# Patient Record
Sex: Female | Born: 1940 | Race: Black or African American | Hispanic: No | Smoking: Never smoker
Health system: Southern US, Community
[De-identification: ages and names within clinical notes are randomized; demographics above are authoritative.]

## PROBLEM LIST (undated history)

## (undated) DIAGNOSIS — E1139 Type 2 diabetes mellitus with other diabetic ophthalmic complication: Secondary | ICD-10-CM

## (undated) DIAGNOSIS — K219 Gastro-esophageal reflux disease without esophagitis: Secondary | ICD-10-CM

## (undated) DIAGNOSIS — F32A Depression, unspecified: Secondary | ICD-10-CM

## (undated) DIAGNOSIS — G5603 Carpal tunnel syndrome, bilateral upper limbs: Secondary | ICD-10-CM

## (undated) DIAGNOSIS — Z9289 Personal history of other medical treatment: Secondary | ICD-10-CM

## (undated) DIAGNOSIS — R200 Anesthesia of skin: Secondary | ICD-10-CM

## (undated) DIAGNOSIS — Z87442 Personal history of urinary calculi: Secondary | ICD-10-CM

## (undated) DIAGNOSIS — E1165 Type 2 diabetes mellitus with hyperglycemia: Secondary | ICD-10-CM

## (undated) DIAGNOSIS — J45901 Unspecified asthma with (acute) exacerbation: Secondary | ICD-10-CM

## (undated) DIAGNOSIS — R600 Localized edema: Secondary | ICD-10-CM

## (undated) DIAGNOSIS — Z87898 Personal history of other specified conditions: Secondary | ICD-10-CM

## (undated) DIAGNOSIS — M4800 Spinal stenosis, site unspecified: Secondary | ICD-10-CM

## (undated) DIAGNOSIS — R3915 Urgency of urination: Secondary | ICD-10-CM

## (undated) DIAGNOSIS — R202 Paresthesia of skin: Secondary | ICD-10-CM

## (undated) DIAGNOSIS — H409 Unspecified glaucoma: Secondary | ICD-10-CM

## (undated) DIAGNOSIS — M129 Arthropathy, unspecified: Secondary | ICD-10-CM

## (undated) DIAGNOSIS — S7291XA Unspecified fracture of right femur, initial encounter for closed fracture: Secondary | ICD-10-CM

## (undated) DIAGNOSIS — M1990 Unspecified osteoarthritis, unspecified site: Secondary | ICD-10-CM

## (undated) DIAGNOSIS — G43909 Migraine, unspecified, not intractable, without status migrainosus: Secondary | ICD-10-CM

## (undated) DIAGNOSIS — J302 Other seasonal allergic rhinitis: Secondary | ICD-10-CM

## (undated) DIAGNOSIS — M25519 Pain in unspecified shoulder: Secondary | ICD-10-CM

## (undated) DIAGNOSIS — F329 Major depressive disorder, single episode, unspecified: Secondary | ICD-10-CM

## (undated) DIAGNOSIS — D649 Anemia, unspecified: Secondary | ICD-10-CM

## (undated) DIAGNOSIS — E785 Hyperlipidemia, unspecified: Secondary | ICD-10-CM

## (undated) DIAGNOSIS — F0390 Unspecified dementia without behavioral disturbance: Secondary | ICD-10-CM

## (undated) DIAGNOSIS — M545 Low back pain: Secondary | ICD-10-CM

## (undated) DIAGNOSIS — G894 Chronic pain syndrome: Secondary | ICD-10-CM

## (undated) DIAGNOSIS — R0602 Shortness of breath: Secondary | ICD-10-CM

## (undated) DIAGNOSIS — I10 Essential (primary) hypertension: Secondary | ICD-10-CM

## (undated) HISTORY — DX: Type 2 diabetes mellitus with other diabetic ophthalmic complication: E11.39

## (undated) HISTORY — DX: Gastro-esophageal reflux disease without esophagitis: K21.9

## (undated) HISTORY — PX: OTHER SURGICAL HISTORY: SHX169

## (undated) HISTORY — DX: Unspecified fracture of right femur, initial encounter for closed fracture: S72.91XA

## (undated) HISTORY — PX: CATARACT EXTRACTION: SUR2

## (undated) HISTORY — DX: Hyperlipidemia, unspecified: E78.5

## (undated) HISTORY — DX: Unspecified asthma with (acute) exacerbation: J45.901

## (undated) HISTORY — DX: Type 2 diabetes mellitus with hyperglycemia: E11.65

## (undated) HISTORY — PX: BACK SURGERY: SHX140

## (undated) HISTORY — PX: TUBAL LIGATION: SHX77

## (undated) HISTORY — DX: Carpal tunnel syndrome, bilateral upper limbs: G56.03

## (undated) HISTORY — DX: Personal history of other specified conditions: Z87.898

## (undated) HISTORY — DX: Migraine, unspecified, not intractable, without status migrainosus: G43.909

## (undated) HISTORY — DX: Personal history of urinary calculi: Z87.442

## (undated) HISTORY — DX: Shortness of breath: R06.02

## (undated) HISTORY — DX: Chronic pain syndrome: G89.4

## (undated) HISTORY — DX: Unspecified dementia, unspecified severity, without behavioral disturbance, psychotic disturbance, mood disturbance, and anxiety: F03.90

## (undated) HISTORY — PX: CHOLECYSTECTOMY: SHX55

## (undated) HISTORY — DX: Low back pain: M54.5

## (undated) HISTORY — DX: Essential (primary) hypertension: I10

## (undated) HISTORY — DX: Pain in unspecified shoulder: M25.519

## (undated) HISTORY — DX: Arthropathy, unspecified: M12.9

## (undated) HISTORY — DX: Spinal stenosis, site unspecified: M48.00

## (undated) HISTORY — DX: Anemia, unspecified: D64.9

## (undated) HISTORY — DX: Unspecified glaucoma: H40.9

## (undated) HISTORY — PX: THORACIC FUSION: SHX1062

---

## 1996-08-03 LAB — HM COLONOSCOPY: HM Colonoscopy: NORMAL

## 1998-02-01 ENCOUNTER — Ambulatory Visit (HOSPITAL_COMMUNITY): Admission: RE | Admit: 1998-02-01 | Discharge: 1998-02-01 | Payer: Self-pay | Admitting: *Deleted

## 1998-02-17 ENCOUNTER — Ambulatory Visit (HOSPITAL_COMMUNITY): Admission: RE | Admit: 1998-02-17 | Discharge: 1998-02-17 | Payer: Self-pay | Admitting: *Deleted

## 1998-11-07 ENCOUNTER — Other Ambulatory Visit: Admission: RE | Admit: 1998-11-07 | Discharge: 1998-11-07 | Payer: Self-pay | Admitting: Obstetrics and Gynecology

## 2000-01-30 ENCOUNTER — Other Ambulatory Visit: Admission: RE | Admit: 2000-01-30 | Discharge: 2000-01-30 | Payer: Self-pay | Admitting: Obstetrics and Gynecology

## 2001-03-03 ENCOUNTER — Other Ambulatory Visit: Admission: RE | Admit: 2001-03-03 | Discharge: 2001-03-03 | Payer: Self-pay | Admitting: Obstetrics and Gynecology

## 2002-04-05 ENCOUNTER — Other Ambulatory Visit: Admission: RE | Admit: 2002-04-05 | Discharge: 2002-04-05 | Payer: Self-pay | Admitting: Obstetrics and Gynecology

## 2002-10-20 ENCOUNTER — Ambulatory Visit (HOSPITAL_COMMUNITY): Admission: RE | Admit: 2002-10-20 | Discharge: 2002-10-20 | Payer: Self-pay | Admitting: Family Medicine

## 2002-10-20 ENCOUNTER — Encounter: Payer: Self-pay | Admitting: Family Medicine

## 2003-04-18 ENCOUNTER — Other Ambulatory Visit: Admission: RE | Admit: 2003-04-18 | Discharge: 2003-04-18 | Payer: Self-pay | Admitting: Obstetrics and Gynecology

## 2003-06-15 ENCOUNTER — Ambulatory Visit (HOSPITAL_COMMUNITY): Admission: RE | Admit: 2003-06-15 | Discharge: 2003-06-15 | Payer: Self-pay | Admitting: Family Medicine

## 2003-08-04 ENCOUNTER — Emergency Department (HOSPITAL_COMMUNITY): Admission: EM | Admit: 2003-08-04 | Discharge: 2003-08-04 | Payer: Self-pay | Admitting: Emergency Medicine

## 2004-05-01 ENCOUNTER — Other Ambulatory Visit: Admission: RE | Admit: 2004-05-01 | Discharge: 2004-05-01 | Payer: Self-pay | Admitting: Obstetrics and Gynecology

## 2005-05-04 ENCOUNTER — Other Ambulatory Visit: Admission: RE | Admit: 2005-05-04 | Discharge: 2005-05-04 | Payer: Self-pay | Admitting: Obstetrics and Gynecology

## 2006-06-01 ENCOUNTER — Other Ambulatory Visit: Admission: RE | Admit: 2006-06-01 | Discharge: 2006-06-01 | Payer: Self-pay | Admitting: Obstetrics and Gynecology

## 2006-06-28 ENCOUNTER — Encounter: Payer: Self-pay | Admitting: Internal Medicine

## 2006-06-28 LAB — CONVERTED CEMR LAB

## 2007-02-17 ENCOUNTER — Ambulatory Visit: Payer: Self-pay | Admitting: Internal Medicine

## 2007-02-18 ENCOUNTER — Encounter: Payer: Self-pay | Admitting: Internal Medicine

## 2007-02-18 LAB — CONVERTED CEMR LAB
ALT: 22 units/L (ref 0–35)
AST: 20 units/L (ref 0–37)
Albumin: 3.6 g/dL (ref 3.5–5.2)
Alkaline Phosphatase: 60 units/L (ref 39–117)
BUN: 22 mg/dL (ref 6–23)
Basophils Absolute: 0 10*3/uL (ref 0.0–0.1)
Basophils Relative: 0.6 % (ref 0.0–1.0)
Bilirubin, Direct: 0.1 mg/dL (ref 0.0–0.3)
Blood Glucose, Fasting: 167 mg/dL
CO2: 27 meq/L (ref 19–32)
Calcium: 10 mg/dL (ref 8.4–10.5)
Chloride: 106 meq/L (ref 96–112)
Cholesterol: 200 mg/dL (ref 0–200)
Creatinine, Ser: 0.9 mg/dL (ref 0.4–1.2)
Eosinophils Absolute: 0.4 10*3/uL (ref 0.0–0.6)
Eosinophils Relative: 6.4 % — ABNORMAL HIGH (ref 0.0–5.0)
GFR calc Af Amer: 81 mL/min
GFR calc non Af Amer: 67 mL/min
Glucose, Bld: 167 mg/dL — ABNORMAL HIGH (ref 70–99)
HCT: 31.4 % — ABNORMAL LOW (ref 36.0–46.0)
HDL: 42 mg/dL (ref >39.0)
Hemoglobin: 10.9 g/dL — ABNORMAL LOW (ref 12.0–15.0)
Hgb A1c MFr Bld: 9.6 %
Hgb A1c MFr Bld: 9.6 % — ABNORMAL HIGH (ref 4.6–6.0)
LDL Cholesterol: 142 mg/dL — ABNORMAL HIGH (ref 0–99)
Lymphocytes Relative: 32.2 % (ref 12.0–46.0)
MCHC: 34.6 g/dL (ref 30.0–36.0)
MCV: 90.4 fL (ref 78.0–100.0)
Monocytes Absolute: 0.6 10*3/uL (ref 0.2–0.7)
Monocytes Relative: 9.8 % (ref 3.0–11.0)
Neutro Abs: 2.9 10*3/uL (ref 1.4–7.7)
Neutrophils Relative %: 51 % (ref 43.0–77.0)
Platelets: 194 10*3/uL (ref 150–400)
Potassium: 5.1 meq/L (ref 3.5–5.1)
RBC: 3.47 M/uL — ABNORMAL LOW (ref 3.87–5.11)
RDW: 13.5 % (ref 11.5–14.6)
Sodium: 140 meq/L (ref 135–145)
TSH: 2.84 microintl units/mL
TSH: 2.84 microintl units/mL (ref 0.35–5.50)
Total Bilirubin: 0.8 mg/dL (ref 0.3–1.2)
Total CHOL/HDL Ratio: 4.8
Total Protein: 7 g/dL (ref 6.0–8.3)
Triglycerides: 82 mg/dL (ref 0–149)
VLDL: 16 mg/dL (ref 0–40)
WBC: 5.8 10*3/uL (ref 4.5–10.5)

## 2007-03-14 ENCOUNTER — Ambulatory Visit: Payer: Self-pay | Admitting: Internal Medicine

## 2007-05-26 ENCOUNTER — Ambulatory Visit: Payer: Self-pay | Admitting: Internal Medicine

## 2007-05-26 LAB — CONVERTED CEMR LAB
ALT: 23 units/L (ref 0–35)
AST: 18 units/L (ref 0–37)
Cholesterol: 129 mg/dL (ref 0–200)
Ferritin: 143 ng/mL (ref 10.0–291.0)
Folate: >20 ng/mL
HDL: 49.3 mg/dL (ref >39.0)
Iron: 79 ug/dL (ref 42–145)
LDL Cholesterol: 67 mg/dL (ref 0–99)
Retic Ct Pct: 1.2 % (ref 0.4–3.1)
Saturation Ratios: 25.1 % (ref 20.0–50.0)
Sed Rate: 30 mm/hr — ABNORMAL HIGH (ref 0–25)
Total CHOL/HDL Ratio: 2.6
Transferrin: 224.8 mg/dL (ref >=212.0)
Triglycerides: 66 mg/dL (ref 0–149)
VLDL: 13 mg/dL (ref 0–40)
Vitamin B-12: 724 pg/mL (ref 211–911)

## 2007-05-30 ENCOUNTER — Encounter: Payer: Self-pay | Admitting: Internal Medicine

## 2007-05-30 DIAGNOSIS — Z87898 Personal history of other specified conditions: Secondary | ICD-10-CM | POA: Insufficient documentation

## 2007-05-30 DIAGNOSIS — E785 Hyperlipidemia, unspecified: Secondary | ICD-10-CM

## 2007-05-30 DIAGNOSIS — H409 Unspecified glaucoma: Secondary | ICD-10-CM

## 2007-05-30 DIAGNOSIS — E1139 Type 2 diabetes mellitus with other diabetic ophthalmic complication: Secondary | ICD-10-CM

## 2007-05-30 DIAGNOSIS — G43909 Migraine, unspecified, not intractable, without status migrainosus: Secondary | ICD-10-CM | POA: Insufficient documentation

## 2007-05-30 DIAGNOSIS — IMO0001 Reserved for inherently not codable concepts without codable children: Secondary | ICD-10-CM

## 2007-05-30 DIAGNOSIS — E1165 Type 2 diabetes mellitus with hyperglycemia: Secondary | ICD-10-CM

## 2007-05-30 DIAGNOSIS — J45909 Unspecified asthma, uncomplicated: Secondary | ICD-10-CM

## 2007-05-30 HISTORY — DX: Reserved for inherently not codable concepts without codable children: IMO0001

## 2007-05-30 HISTORY — DX: Unspecified glaucoma: H40.9

## 2007-05-30 HISTORY — DX: Hyperlipidemia, unspecified: E78.5

## 2007-05-30 HISTORY — DX: Personal history of other specified conditions: Z87.898

## 2007-05-30 HISTORY — DX: Type 2 diabetes mellitus with other diabetic ophthalmic complication: E11.39

## 2007-06-03 ENCOUNTER — Other Ambulatory Visit: Admission: RE | Admit: 2007-06-03 | Discharge: 2007-06-03 | Payer: Self-pay | Admitting: Obstetrics and Gynecology

## 2007-08-01 ENCOUNTER — Ambulatory Visit: Payer: Self-pay | Admitting: Internal Medicine

## 2007-08-01 LAB — CONVERTED CEMR LAB
BUN: 25 mg/dL — ABNORMAL HIGH (ref 6–23)
CO2: 29 meq/L (ref 19–32)
Calcium: 9.8 mg/dL (ref 8.4–10.5)
Chloride: 101 meq/L (ref 96–112)
Creatinine, Ser: 0.8 mg/dL (ref 0.4–1.2)
Creatinine,U: 80.1 mg/dL
GFR calc Af Amer: 92 mL/min
GFR calc non Af Amer: 76 mL/min
Glucose, Bld: 285 mg/dL — ABNORMAL HIGH (ref 70–99)
Hgb A1c MFr Bld: 9.3 % — ABNORMAL HIGH (ref 4.6–6.0)
Microalb Creat Ratio: 98.6 mg/g — ABNORMAL HIGH (ref 0.0–30.0)
Microalb, Ur: 7.9 mg/dL — ABNORMAL HIGH (ref 0.0–1.9)
Potassium: 5 meq/L (ref 3.5–5.1)
Sodium: 137 meq/L (ref 135–145)

## 2007-08-03 ENCOUNTER — Encounter: Payer: Self-pay | Admitting: Internal Medicine

## 2007-10-24 ENCOUNTER — Ambulatory Visit: Payer: Self-pay | Admitting: Internal Medicine

## 2007-10-24 LAB — CONVERTED CEMR LAB
ALT: 21 units/L (ref 0–35)
AST: 22 units/L (ref 0–37)
Albumin: 3.7 g/dL (ref 3.5–5.2)
Alkaline Phosphatase: 52 units/L (ref 39–117)
BUN: 25 mg/dL — ABNORMAL HIGH (ref 6–23)
Bilirubin, Direct: 0.1 mg/dL (ref 0.0–0.3)
CO2: 27 meq/L (ref 19–32)
Calcium: 9.8 mg/dL (ref 8.4–10.5)
Chloride: 107 meq/L (ref 96–112)
Cholesterol: 199 mg/dL (ref 0–200)
Creatinine, Ser: 0.9 mg/dL (ref 0.4–1.2)
Creatinine,U: 118.9 mg/dL
GFR calc Af Amer: 81 mL/min
GFR calc non Af Amer: 67 mL/min
Glucose, Bld: 137 mg/dL — ABNORMAL HIGH (ref 70–99)
HDL: 46.8 mg/dL (ref >39.0)
Hgb A1c MFr Bld: 8.8 % — ABNORMAL HIGH (ref 4.6–6.0)
LDL Cholesterol: 134 mg/dL — ABNORMAL HIGH (ref 0–99)
Microalb Creat Ratio: 116.9 mg/g — ABNORMAL HIGH (ref 0.0–30.0)
Microalb, Ur: 13.9 mg/dL — ABNORMAL HIGH (ref 0.0–1.9)
Potassium: 4.9 meq/L (ref 3.5–5.1)
Sodium: 140 meq/L (ref 135–145)
Total Bilirubin: 0.7 mg/dL (ref 0.3–1.2)
Total CHOL/HDL Ratio: 4.3
Total Protein: 7 g/dL (ref 6.0–8.3)
Triglycerides: 92 mg/dL (ref 0–149)
VLDL: 18 mg/dL (ref 0–40)

## 2007-10-31 ENCOUNTER — Ambulatory Visit: Payer: Self-pay | Admitting: Internal Medicine

## 2007-10-31 DIAGNOSIS — I10 Essential (primary) hypertension: Secondary | ICD-10-CM

## 2007-10-31 HISTORY — DX: Essential (primary) hypertension: I10

## 2007-12-12 ENCOUNTER — Telehealth: Payer: Self-pay | Admitting: Internal Medicine

## 2007-12-12 ENCOUNTER — Ambulatory Visit: Payer: Self-pay | Admitting: Internal Medicine

## 2007-12-12 LAB — CONVERTED CEMR LAB
BUN: 16 mg/dL (ref 6–23)
CO2: 29 meq/L (ref 19–32)
Calcium: 9.3 mg/dL (ref 8.4–10.5)
Chloride: 110 meq/L (ref 96–112)
Creatinine, Ser: 0.8 mg/dL (ref 0.4–1.2)
GFR calc Af Amer: 92 mL/min
GFR calc non Af Amer: 76 mL/min
Glucose, Bld: 103 mg/dL — ABNORMAL HIGH (ref 70–99)
Potassium: 4.3 meq/L (ref 3.5–5.1)
Sodium: 142 meq/L (ref 135–145)

## 2007-12-19 ENCOUNTER — Encounter: Payer: Self-pay | Admitting: Internal Medicine

## 2008-01-18 ENCOUNTER — Ambulatory Visit: Payer: Self-pay | Admitting: Internal Medicine

## 2008-01-18 DIAGNOSIS — Z87442 Personal history of urinary calculi: Secondary | ICD-10-CM | POA: Insufficient documentation

## 2008-01-18 DIAGNOSIS — D649 Anemia, unspecified: Secondary | ICD-10-CM

## 2008-01-18 DIAGNOSIS — J309 Allergic rhinitis, unspecified: Secondary | ICD-10-CM | POA: Insufficient documentation

## 2008-01-18 DIAGNOSIS — M545 Low back pain, unspecified: Secondary | ICD-10-CM | POA: Insufficient documentation

## 2008-01-18 DIAGNOSIS — I5032 Chronic diastolic (congestive) heart failure: Secondary | ICD-10-CM

## 2008-01-18 DIAGNOSIS — R0602 Shortness of breath: Secondary | ICD-10-CM | POA: Insufficient documentation

## 2008-01-18 HISTORY — DX: Low back pain, unspecified: M54.50

## 2008-01-18 HISTORY — DX: Personal history of urinary calculi: Z87.442

## 2008-01-18 HISTORY — DX: Anemia, unspecified: D64.9

## 2008-01-25 ENCOUNTER — Encounter: Payer: Self-pay | Admitting: Internal Medicine

## 2008-01-25 ENCOUNTER — Ambulatory Visit: Payer: Self-pay

## 2008-03-05 ENCOUNTER — Ambulatory Visit: Payer: Self-pay | Admitting: Cardiology

## 2008-03-05 LAB — CONVERTED CEMR LAB: Pro B Natriuretic peptide (BNP): 27 pg/mL (ref 0.0–100.0)

## 2008-03-27 ENCOUNTER — Telehealth: Payer: Self-pay | Admitting: Internal Medicine

## 2008-04-12 ENCOUNTER — Ambulatory Visit: Payer: Self-pay | Admitting: Internal Medicine

## 2008-04-12 LAB — CONVERTED CEMR LAB
ALT: 25 units/L (ref 0–35)
AST: 19 units/L (ref 0–37)
Albumin: 3.8 g/dL (ref 3.5–5.2)
Alkaline Phosphatase: 55 units/L (ref 39–117)
BUN: 27 mg/dL — ABNORMAL HIGH (ref 6–23)
Basophils Absolute: 0 10*3/uL (ref 0.0–0.1)
Basophils Relative: 0 % (ref 0.0–3.0)
Bilirubin Urine: NEGATIVE
Bilirubin, Direct: 0.1 mg/dL (ref 0.0–0.3)
CO2: 28 meq/L (ref 19–32)
Calcium: 9.6 mg/dL (ref 8.4–10.5)
Chloride: 106 meq/L (ref 96–112)
Cholesterol: 148 mg/dL (ref 0–200)
Creatinine, Ser: 0.9 mg/dL (ref 0.4–1.2)
Creatinine,U: 308.7 mg/dL
Crystals: NEGATIVE
Eosinophils Absolute: 0.4 10*3/uL (ref 0.0–0.7)
Eosinophils Relative: 6 % — ABNORMAL HIGH (ref 0.0–5.0)
GFR calc Af Amer: 80 mL/min
GFR calc non Af Amer: 66 mL/min
Glucose, Bld: 183 mg/dL — ABNORMAL HIGH (ref 70–99)
HCT: 34.3 % — ABNORMAL LOW (ref 36.0–46.0)
HDL: 44.7 mg/dL (ref >39.0)
Hemoglobin, Urine: NEGATIVE
Hemoglobin: 11.6 g/dL — ABNORMAL LOW (ref 12.0–15.0)
Hgb A1c MFr Bld: 8.7 % — ABNORMAL HIGH (ref 4.6–6.0)
Ketones, ur: NEGATIVE mg/dL
LDL Cholesterol: 90 mg/dL (ref 0–99)
Lymphocytes Relative: 34.6 % (ref 12.0–46.0)
MCHC: 33.8 g/dL (ref 30.0–36.0)
MCV: 90.8 fL (ref 78.0–100.0)
Microalb Creat Ratio: 42.4 mg/g — ABNORMAL HIGH (ref 0.0–30.0)
Microalb, Ur: 13.1 mg/dL — ABNORMAL HIGH (ref 0.0–1.9)
Monocytes Absolute: 0.6 10*3/uL (ref 0.1–1.0)
Monocytes Relative: 10 % (ref 3.0–12.0)
Mucus, UA: NEGATIVE
Neutro Abs: 2.9 10*3/uL (ref 1.4–7.7)
Neutrophils Relative %: 49.4 % (ref 43.0–77.0)
Nitrite: NEGATIVE
Platelets: 205 10*3/uL (ref 150–400)
Potassium: 4.3 meq/L (ref 3.5–5.1)
RBC / HPF: NONE SEEN
RBC: 3.77 M/uL — ABNORMAL LOW (ref 3.87–5.11)
RDW: 13.5 % (ref 11.5–14.6)
Sodium: 139 meq/L (ref 135–145)
Specific Gravity, Urine: >=1.03 (ref 1.000–1.03)
TSH: 1.91 microintl units/mL (ref 0.35–5.50)
Total Bilirubin: 0.7 mg/dL (ref 0.3–1.2)
Total CHOL/HDL Ratio: 3.3
Total Protein: 7.3 g/dL (ref 6.0–8.3)
Triglycerides: 69 mg/dL (ref 0–149)
Urine Glucose: NEGATIVE mg/dL
Urobilinogen, UA: 0.2 (ref 0.0–1.0)
VLDL: 14 mg/dL (ref 0–40)
WBC: 5.9 10*3/uL (ref 4.5–10.5)
pH: 5 (ref 5.0–8.0)

## 2008-04-16 ENCOUNTER — Ambulatory Visit: Payer: Self-pay | Admitting: Internal Medicine

## 2008-06-06 ENCOUNTER — Ambulatory Visit: Payer: Self-pay | Admitting: Internal Medicine

## 2008-06-27 ENCOUNTER — Ambulatory Visit: Payer: Self-pay | Admitting: Obstetrics and Gynecology

## 2008-07-17 ENCOUNTER — Encounter: Payer: Self-pay | Admitting: Internal Medicine

## 2008-08-20 ENCOUNTER — Telehealth (INDEPENDENT_AMBULATORY_CARE_PROVIDER_SITE_OTHER): Payer: Self-pay | Admitting: *Deleted

## 2008-08-20 ENCOUNTER — Ambulatory Visit: Payer: Self-pay | Admitting: Internal Medicine

## 2008-08-20 DIAGNOSIS — R1013 Epigastric pain: Secondary | ICD-10-CM | POA: Insufficient documentation

## 2008-08-20 LAB — CONVERTED CEMR LAB
ALT: 23 units/L (ref 0–35)
AST: 21 units/L (ref 0–37)
Albumin: 3.3 g/dL — ABNORMAL LOW (ref 3.5–5.2)
Alkaline Phosphatase: 45 units/L (ref 39–117)
Amylase: 50 units/L (ref 27–131)
BUN: 31 mg/dL — ABNORMAL HIGH (ref 6–23)
Basophils Absolute: 0 10*3/uL (ref 0.0–0.1)
Basophils Relative: 0.3 % (ref 0.0–3.0)
Bilirubin Urine: NEGATIVE
Bilirubin, Direct: 0.1 mg/dL (ref 0.0–0.3)
CO2: 28 meq/L (ref 19–32)
Calcium: 9.1 mg/dL (ref 8.4–10.5)
Chloride: 106 meq/L (ref 96–112)
Creatinine, Ser: 0.9 mg/dL (ref 0.4–1.2)
Eosinophils Absolute: 0.1 10*3/uL (ref 0.0–0.7)
Eosinophils Relative: 1.3 % (ref 0.0–5.0)
GFR calc Af Amer: 80 mL/min
GFR calc non Af Amer: 66 mL/min
Glucose, Bld: 176 mg/dL — ABNORMAL HIGH (ref 70–99)
HCT: 34.5 % — ABNORMAL LOW (ref 36.0–46.0)
Hemoglobin, Urine: NEGATIVE
Hemoglobin: 11.9 g/dL — ABNORMAL LOW (ref 12.0–15.0)
Hgb A1c MFr Bld: 8 % — ABNORMAL HIGH (ref 4.6–6.0)
Ketones, ur: NEGATIVE mg/dL
Leukocytes, UA: NEGATIVE
Lipase: 16 units/L (ref 11.0–59.0)
Lymphocytes Relative: 23.8 % (ref 12.0–46.0)
MCHC: 34.6 g/dL (ref 30.0–36.0)
MCV: 90.1 fL (ref 78.0–100.0)
Monocytes Absolute: 0.5 10*3/uL (ref 0.1–1.0)
Monocytes Relative: 7.2 % (ref 3.0–12.0)
Neutro Abs: 5.2 10*3/uL (ref 1.4–7.7)
Neutrophils Relative %: 67.4 % (ref 43.0–77.0)
Nitrite: NEGATIVE
Platelets: 194 10*3/uL (ref 150–400)
Potassium: 3.8 meq/L (ref 3.5–5.1)
RBC: 3.83 M/uL — ABNORMAL LOW (ref 3.87–5.11)
RDW: 13.9 % (ref 11.5–14.6)
Sodium: 141 meq/L (ref 135–145)
Specific Gravity, Urine: >=1.03 (ref 1.000–1.03)
TSH: 0.86 microintl units/mL (ref 0.35–5.50)
Total Bilirubin: 0.8 mg/dL (ref 0.3–1.2)
Total Protein: 6.8 g/dL (ref 6.0–8.3)
Urine Glucose: NEGATIVE mg/dL
Urobilinogen, UA: 0.2 (ref 0.0–1.0)
WBC: 7.6 10*3/uL (ref 4.5–10.5)
pH: 5.5 (ref 5.0–8.0)

## 2008-08-23 ENCOUNTER — Ambulatory Visit: Payer: Self-pay | Admitting: Internal Medicine

## 2008-11-19 ENCOUNTER — Telehealth: Payer: Self-pay | Admitting: Internal Medicine

## 2008-11-29 ENCOUNTER — Ambulatory Visit: Payer: Self-pay | Admitting: Internal Medicine

## 2008-11-29 DIAGNOSIS — J45901 Unspecified asthma with (acute) exacerbation: Secondary | ICD-10-CM

## 2008-11-29 DIAGNOSIS — M25469 Effusion, unspecified knee: Secondary | ICD-10-CM

## 2008-11-29 HISTORY — DX: Unspecified asthma with (acute) exacerbation: J45.901

## 2009-02-26 ENCOUNTER — Ambulatory Visit: Payer: Self-pay | Admitting: Internal Medicine

## 2009-02-26 LAB — CONVERTED CEMR LAB
BUN: 30 mg/dL — ABNORMAL HIGH (ref 6–23)
CO2: 29 meq/L (ref 19–32)
Calcium: 9.9 mg/dL (ref 8.4–10.5)
Chloride: 106 meq/L (ref 96–112)
Cholesterol: 137 mg/dL (ref 0–200)
Creatinine, Ser: 1 mg/dL (ref 0.4–1.2)
GFR calc non Af Amer: 70.87 mL/min (ref >60)
Glucose, Bld: 94 mg/dL (ref 70–99)
HDL: 47.7 mg/dL (ref >39.00)
Hgb A1c MFr Bld: 7.5 % — ABNORMAL HIGH (ref 4.6–6.5)
LDL Cholesterol: 72 mg/dL (ref 0–99)
Potassium: 4.4 meq/L (ref 3.5–5.1)
Sodium: 141 meq/L (ref 135–145)
Total CHOL/HDL Ratio: 3
Triglycerides: 87 mg/dL (ref 0.0–149.0)
VLDL: 17.4 mg/dL (ref 0.0–40.0)

## 2009-03-15 ENCOUNTER — Telehealth: Payer: Self-pay | Admitting: Internal Medicine

## 2009-04-09 ENCOUNTER — Encounter: Payer: Self-pay | Admitting: Internal Medicine

## 2009-04-09 ENCOUNTER — Ambulatory Visit: Payer: Self-pay | Admitting: Internal Medicine

## 2009-04-09 DIAGNOSIS — R110 Nausea: Secondary | ICD-10-CM

## 2009-04-09 DIAGNOSIS — M25519 Pain in unspecified shoulder: Secondary | ICD-10-CM

## 2009-04-09 DIAGNOSIS — R079 Chest pain, unspecified: Secondary | ICD-10-CM | POA: Insufficient documentation

## 2009-04-09 HISTORY — DX: Pain in unspecified shoulder: M25.519

## 2009-04-24 ENCOUNTER — Telehealth (INDEPENDENT_AMBULATORY_CARE_PROVIDER_SITE_OTHER): Payer: Self-pay

## 2009-04-25 ENCOUNTER — Ambulatory Visit: Payer: Self-pay

## 2009-04-29 ENCOUNTER — Telehealth (INDEPENDENT_AMBULATORY_CARE_PROVIDER_SITE_OTHER): Payer: Self-pay | Admitting: *Deleted

## 2009-04-30 ENCOUNTER — Ambulatory Visit: Payer: Self-pay

## 2009-04-30 ENCOUNTER — Encounter: Payer: Self-pay | Admitting: Cardiology

## 2009-05-01 ENCOUNTER — Encounter: Payer: Self-pay | Admitting: Internal Medicine

## 2009-05-01 DIAGNOSIS — R9439 Abnormal result of other cardiovascular function study: Secondary | ICD-10-CM | POA: Insufficient documentation

## 2009-05-21 ENCOUNTER — Ambulatory Visit: Payer: Self-pay | Admitting: Cardiology

## 2009-05-28 ENCOUNTER — Encounter: Payer: Self-pay | Admitting: Obstetrics and Gynecology

## 2009-05-28 ENCOUNTER — Other Ambulatory Visit: Admission: RE | Admit: 2009-05-28 | Discharge: 2009-05-28 | Payer: Self-pay | Admitting: Obstetrics and Gynecology

## 2009-05-28 ENCOUNTER — Ambulatory Visit: Payer: Self-pay | Admitting: Obstetrics and Gynecology

## 2009-06-04 ENCOUNTER — Telehealth: Payer: Self-pay | Admitting: Internal Medicine

## 2009-06-04 ENCOUNTER — Ambulatory Visit: Payer: Self-pay | Admitting: Obstetrics and Gynecology

## 2009-07-31 ENCOUNTER — Telehealth: Payer: Self-pay | Admitting: Internal Medicine

## 2009-08-28 ENCOUNTER — Ambulatory Visit: Payer: Self-pay | Admitting: Internal Medicine

## 2009-08-29 LAB — CONVERTED CEMR LAB
ALT: 21 units/L (ref 0–35)
AST: 21 units/L (ref 0–37)
Albumin: 3.7 g/dL (ref 3.5–5.2)
Alkaline Phosphatase: 52 units/L (ref 39–117)
BUN: 19 mg/dL (ref 6–23)
Basophils Absolute: 0 10*3/uL (ref 0.0–0.1)
Basophils Relative: 0.4 % (ref 0.0–3.0)
Bilirubin Urine: NEGATIVE
Bilirubin, Direct: 0 mg/dL (ref 0.0–0.3)
CO2: 30 meq/L (ref 19–32)
Calcium: 9.9 mg/dL (ref 8.4–10.5)
Chloride: 107 meq/L (ref 96–112)
Cholesterol: 131 mg/dL (ref 0–200)
Creatinine, Ser: 0.9 mg/dL (ref 0.4–1.2)
Creatinine,U: 134 mg/dL
Eosinophils Absolute: 0.2 10*3/uL (ref 0.0–0.7)
Eosinophils Relative: 4.9 % (ref 0.0–5.0)
Folate: >20 ng/mL
GFR calc non Af Amer: 79.91 mL/min (ref >60)
Glucose, Bld: 131 mg/dL — ABNORMAL HIGH (ref 70–99)
HCT: 32.7 % — ABNORMAL LOW (ref 36.0–46.0)
HDL: 50.8 mg/dL (ref >39.00)
Hemoglobin, Urine: NEGATIVE
Hemoglobin: 10.9 g/dL — ABNORMAL LOW (ref 12.0–15.0)
Hgb A1c MFr Bld: 7.9 % — ABNORMAL HIGH (ref 4.6–6.5)
Iron: 106 ug/dL (ref 42–145)
Ketones, ur: NEGATIVE mg/dL
LDL Cholesterol: 66 mg/dL (ref 0–99)
Leukocytes, UA: NEGATIVE
Lymphocytes Relative: 33.3 % (ref 12.0–46.0)
Lymphs Abs: 1.7 10*3/uL (ref 0.7–4.0)
MCHC: 33.4 g/dL (ref 30.0–36.0)
MCV: 93.2 fL (ref 78.0–100.0)
Microalb Creat Ratio: 58.2 mg/g — ABNORMAL HIGH (ref 0.0–30.0)
Microalb, Ur: 7.8 mg/dL — ABNORMAL HIGH (ref 0.0–1.9)
Monocytes Absolute: 0.4 10*3/uL (ref 0.1–1.0)
Monocytes Relative: 8.8 % (ref 3.0–12.0)
Neutro Abs: 2.8 10*3/uL (ref 1.4–7.7)
Neutrophils Relative %: 52.6 % (ref 43.0–77.0)
Nitrite: NEGATIVE
Platelets: 154 10*3/uL (ref 150.0–400.0)
Potassium: 4.5 meq/L (ref 3.5–5.1)
RBC: 3.51 M/uL — ABNORMAL LOW (ref 3.87–5.11)
RDW: 13.6 % (ref 11.5–14.6)
Saturation Ratios: 33.6 % (ref 20.0–50.0)
Sodium: 142 meq/L (ref 135–145)
Specific Gravity, Urine: 1.025 (ref 1.000–1.030)
TSH: 1.78 microintl units/mL (ref 0.35–5.50)
Total Bilirubin: 0.6 mg/dL (ref 0.3–1.2)
Total CHOL/HDL Ratio: 3
Total Protein, Urine: NEGATIVE mg/dL
Total Protein: 7.4 g/dL (ref 6.0–8.3)
Transferrin: 225.2 mg/dL (ref 212.0–360.0)
Triglycerides: 69 mg/dL (ref 0.0–149.0)
Urine Glucose: NEGATIVE mg/dL
Urobilinogen, UA: 0.2 (ref 0.0–1.0)
VLDL: 13.8 mg/dL (ref 0.0–40.0)
Vitamin B-12: 558 pg/mL (ref 211–911)
WBC: 5.1 10*3/uL (ref 4.5–10.5)
pH: 6 (ref 5.0–8.0)

## 2009-09-18 ENCOUNTER — Telehealth: Payer: Self-pay | Admitting: Internal Medicine

## 2009-10-04 ENCOUNTER — Telehealth: Payer: Self-pay | Admitting: Internal Medicine

## 2009-11-06 ENCOUNTER — Telehealth: Payer: Self-pay | Admitting: Internal Medicine

## 2009-12-31 ENCOUNTER — Telehealth: Payer: Self-pay | Admitting: Internal Medicine

## 2010-01-21 ENCOUNTER — Ambulatory Visit: Payer: Self-pay | Admitting: Internal Medicine

## 2010-01-21 DIAGNOSIS — R0609 Other forms of dyspnea: Secondary | ICD-10-CM

## 2010-01-21 DIAGNOSIS — R5383 Other fatigue: Secondary | ICD-10-CM | POA: Insufficient documentation

## 2010-01-21 DIAGNOSIS — R5381 Other malaise: Secondary | ICD-10-CM

## 2010-01-21 DIAGNOSIS — R0989 Other specified symptoms and signs involving the circulatory and respiratory systems: Secondary | ICD-10-CM

## 2010-01-21 DIAGNOSIS — K3189 Other diseases of stomach and duodenum: Secondary | ICD-10-CM

## 2010-01-21 DIAGNOSIS — G894 Chronic pain syndrome: Secondary | ICD-10-CM

## 2010-01-21 DIAGNOSIS — R1013 Epigastric pain: Secondary | ICD-10-CM

## 2010-01-21 HISTORY — DX: Chronic pain syndrome: G89.4

## 2010-01-21 LAB — CONVERTED CEMR LAB
BUN: 29 mg/dL — ABNORMAL HIGH (ref 6–23)
Basophils Absolute: 0 10*3/uL (ref 0.0–0.1)
Basophils Relative: 0.7 % (ref 0.0–3.0)
Bilirubin Urine: NEGATIVE
CO2: 29 meq/L (ref 19–32)
Calcium: 9.8 mg/dL (ref 8.4–10.5)
Chloride: 106 meq/L (ref 96–112)
Cholesterol: 134 mg/dL (ref 0–200)
Creatinine, Ser: 0.9 mg/dL (ref 0.4–1.2)
Eosinophils Absolute: 0.2 10*3/uL (ref 0.0–0.7)
Eosinophils Relative: 4.5 % (ref 0.0–5.0)
Folate: >20 ng/mL
GFR calc non Af Amer: 77.82 mL/min (ref >60)
Glucose, Bld: 241 mg/dL — ABNORMAL HIGH (ref 70–99)
HCT: 33.3 % — ABNORMAL LOW (ref 36.0–46.0)
HDL: 48.4 mg/dL (ref >39.00)
Hemoglobin, Urine: NEGATIVE
Hemoglobin: 11.3 g/dL — ABNORMAL LOW (ref 12.0–15.0)
Hgb A1c MFr Bld: 8.2 % — ABNORMAL HIGH (ref 4.6–6.5)
Iron: 80 ug/dL (ref 42–145)
LDL Cholesterol: 64 mg/dL (ref 0–99)
Lymphocytes Relative: 27.4 % (ref 12.0–46.0)
Lymphs Abs: 1.5 10*3/uL (ref 0.7–4.0)
MCHC: 33.9 g/dL (ref 30.0–36.0)
MCV: 91.7 fL (ref 78.0–100.0)
Monocytes Absolute: 0.5 10*3/uL (ref 0.1–1.0)
Monocytes Relative: 9.3 % (ref 3.0–12.0)
Neutro Abs: 3.2 10*3/uL (ref 1.4–7.7)
Neutrophils Relative %: 58.1 % (ref 43.0–77.0)
Nitrite: NEGATIVE
Platelets: 182 10*3/uL (ref 150.0–400.0)
Potassium: 4.8 meq/L (ref 3.5–5.1)
Pro B Natriuretic peptide (BNP): 80.4 pg/mL (ref 0.0–100.0)
RBC: 3.63 M/uL — ABNORMAL LOW (ref 3.87–5.11)
RDW: 14.7 % — ABNORMAL HIGH (ref 11.5–14.6)
Saturation Ratios: 24.1 % (ref 20.0–50.0)
Sed Rate: 30 mm/hr — ABNORMAL HIGH (ref 0–22)
Sodium: 140 meq/L (ref 135–145)
Specific Gravity, Urine: >=1.03 (ref 1.000–1.030)
TSH: 2.71 microintl units/mL (ref 0.35–5.50)
Total CHOL/HDL Ratio: 3
Total Protein, Urine: NEGATIVE mg/dL
Transferrin: 237.5 mg/dL (ref 212.0–360.0)
Triglycerides: 106 mg/dL (ref 0.0–149.0)
Urine Glucose: 100 mg/dL
Urobilinogen, UA: 0.2 (ref 0.0–1.0)
VLDL: 21.2 mg/dL (ref 0.0–40.0)
Vitamin B-12: 885 pg/mL (ref 211–911)
WBC: 5.5 10*3/uL (ref 4.5–10.5)
pH: 5.5 (ref 5.0–8.0)

## 2010-06-10 ENCOUNTER — Ambulatory Visit: Payer: Self-pay | Admitting: Internal Medicine

## 2010-07-15 ENCOUNTER — Ambulatory Visit: Payer: Self-pay | Admitting: Internal Medicine

## 2010-07-21 LAB — CONVERTED CEMR LAB
ALT: 25 units/L (ref 0–35)
AST: 26 units/L (ref 0–37)
Albumin: 3.8 g/dL (ref 3.5–5.2)
Alkaline Phosphatase: 52 units/L (ref 39–117)
BUN: 28 mg/dL — ABNORMAL HIGH (ref 6–23)
Basophils Absolute: 0 10*3/uL (ref 0.0–0.1)
Basophils Relative: 0.4 % (ref 0.0–3.0)
Bilirubin Urine: NEGATIVE
Bilirubin, Direct: 0.1 mg/dL (ref 0.0–0.3)
Blood, UA: NEGATIVE
CO2: 28 meq/L (ref 19–32)
Calcium: 10 mg/dL (ref 8.4–10.5)
Chloride: 104 meq/L (ref 96–112)
Cholesterol: 124 mg/dL (ref 0–200)
Creatinine, Ser: 1.1 mg/dL (ref 0.4–1.2)
Creatinine,U: 29.1 mg/dL
Eosinophils Absolute: 0.6 10*3/uL (ref 0.0–0.7)
Eosinophils Relative: 8.5 % — ABNORMAL HIGH (ref 0.0–5.0)
GFR calc non Af Amer: 61.29 mL/min (ref >60.00)
Glucose, Bld: 111 mg/dL — ABNORMAL HIGH (ref 70–99)
HCT: 33.9 % — ABNORMAL LOW (ref 36.0–46.0)
HDL: 47.3 mg/dL (ref >39.00)
Hemoglobin: 11.4 g/dL — ABNORMAL LOW (ref 12.0–15.0)
Hgb A1c MFr Bld: 7.8 % — ABNORMAL HIGH (ref 4.6–6.5)
Ketones, ur: NEGATIVE mg/dL
LDL Cholesterol: 63 mg/dL (ref 0–99)
Lymphocytes Relative: 28.7 % (ref 12.0–46.0)
Lymphs Abs: 2 10*3/uL (ref 0.7–4.0)
MCHC: 33.6 g/dL (ref 30.0–36.0)
MCV: 92.3 fL (ref 78.0–100.0)
Microalb Creat Ratio: 5.8 mg/g (ref 0.0–30.0)
Microalb, Ur: 1.7 mg/dL (ref 0.0–1.9)
Monocytes Absolute: 0.6 10*3/uL (ref 0.1–1.0)
Monocytes Relative: 8.5 % (ref 3.0–12.0)
Neutro Abs: 3.7 10*3/uL (ref 1.4–7.7)
Neutrophils Relative %: 53.9 % (ref 43.0–77.0)
Nitrite: NEGATIVE
Platelets: 202 10*3/uL (ref 150.0–400.0)
Potassium: 4.7 meq/L (ref 3.5–5.1)
RBC: 3.67 M/uL — ABNORMAL LOW (ref 3.87–5.11)
RDW: 14.5 % (ref 11.5–14.6)
Sodium: 141 meq/L (ref 135–145)
Specific Gravity, Urine: 1.01 (ref 1.000–1.030)
TSH: 2.6 microintl units/mL (ref 0.35–5.50)
Total Bilirubin: 0.6 mg/dL (ref 0.3–1.2)
Total CHOL/HDL Ratio: 3
Total Protein, Urine: NEGATIVE mg/dL
Total Protein: 7 g/dL (ref 6.0–8.3)
Triglycerides: 69 mg/dL (ref 0.0–149.0)
Urine Glucose: NEGATIVE mg/dL
Urobilinogen, UA: 0.2 (ref 0.0–1.0)
VLDL: 13.8 mg/dL (ref 0.0–40.0)
WBC: 6.8 10*3/uL (ref 4.5–10.5)
pH: 5 (ref 5.0–8.0)

## 2010-07-22 ENCOUNTER — Ambulatory Visit: Payer: Self-pay | Admitting: Internal Medicine

## 2010-07-24 DIAGNOSIS — R197 Diarrhea, unspecified: Secondary | ICD-10-CM | POA: Insufficient documentation

## 2010-08-18 ENCOUNTER — Ambulatory Visit
Admission: RE | Admit: 2010-08-18 | Discharge: 2010-08-18 | Payer: Self-pay | Source: Home / Self Care | Attending: Internal Medicine | Admitting: Internal Medicine

## 2010-08-18 DIAGNOSIS — M129 Arthropathy, unspecified: Secondary | ICD-10-CM

## 2010-08-18 HISTORY — DX: Arthropathy, unspecified: M12.9

## 2010-08-24 ENCOUNTER — Encounter: Payer: Self-pay | Admitting: Family Medicine

## 2010-08-25 ENCOUNTER — Encounter: Payer: Self-pay | Admitting: Physical Medicine and Rehabilitation

## 2010-09-01 ENCOUNTER — Ambulatory Visit (INDEPENDENT_AMBULATORY_CARE_PROVIDER_SITE_OTHER)
Admission: RE | Admit: 2010-09-01 | Discharge: 2010-09-01 | Payer: Medicare Other | Source: Home / Self Care | Attending: Internal Medicine | Admitting: Internal Medicine

## 2010-09-01 DIAGNOSIS — I10 Essential (primary) hypertension: Secondary | ICD-10-CM

## 2010-09-01 DIAGNOSIS — IMO0001 Reserved for inherently not codable concepts without codable children: Secondary | ICD-10-CM

## 2010-09-01 DIAGNOSIS — M25519 Pain in unspecified shoulder: Secondary | ICD-10-CM

## 2010-09-01 DIAGNOSIS — M25569 Pain in unspecified knee: Secondary | ICD-10-CM | POA: Insufficient documentation

## 2010-09-02 NOTE — Progress Notes (Signed)
  Phone Note Refill Request  on September 18, 2009 1:40 PM  Refills Requested: Medication #1:  BD U/F SHORT PEN NEEDLE 31G X 8 MM  MISC Use two times a day as directed---dx code 250.02   Dosage confirmed as above?Dosage Confirmed   Notes: [facility] Initial call taken by: Scharlene Gloss,  September 18, 2009 1:41 PM    Prescriptions: BD U/F SHORT PEN NEEDLE 31G X 8 MM  MISC (INSULIN PEN NEEDLE) Use two times a day as directed---dx code 250.02  #100 x 3   Entered by:   Scharlene Gloss   Authorized by:   Corwin Levins MD   Signed by:   Scharlene Gloss on 09/18/2009   Method used:   Faxed to ...       [facility] (retail)       [address]       [place], [place]  [address]       Ph: [phone]       Fax: [phone]   RxID:   [id number]

## 2010-09-02 NOTE — Assessment & Plan Note (Signed)
Summary: 6 mo rov /nws $50   Vital Signs:  Patient profile:   70 year old female Height:      61 inches Weight:      221 pounds BMI:     41.91 O2 Sat:      98 % on Room air Temp:     98.3 degrees F oral Pulse rate:   68 / minute BP sitting:   112 / 70  (left arm) Cuff size:   large  Vitals Entered ByMarland Kitchen Zella Ball Ewing (August 28, 2009 11:35 AM)  O2 Flow:  Room air  Preventive Care Screening     declines colonoccopy  CC: 6 Mo ROV/RE   Primary Care Provider:  Oliver Barre, MD  CC:  6 Mo ROV/RE.  History of Present Illness: overall doing well;  Pt denies CP, sob, doe, wheezing, orthopnea, pnd, worsening LE edema, palps, dizziness or syncope   Pt denies new neuro symptoms such as headache, facial or extremity weakness   Pt denies polydipsia, polyuria, or low sugar symptoms such as shakiness improved with eating.  Overall good compliance with meds, trying to follow low chol, DM diet, wt stable, little excercise however   still declines heart catheterization  Problems Prior to Update: 1)  Preventive Health Care  (ICD-V70.0) 2)  Myocardial Perfusion Scan, With Stress Test, Abnormal  (ICD-794.39) 3)  Chest Pain  (ICD-786.50) 4)  Nausea  (ICD-787.02) 5)  Shoulder Pain, Bilateral  (ICD-719.41) 6)  Joint Effusion, Left Knee  (ICD-719.06) 7)  Asthma, With Acute Exacerbation  (ICD-493.92) 8)  Abdominal Pain, Epigastric  (ICD-789.06) 9)  Preoperative Visit  (ICD-V72.84) 10)  Screening Colorectal-cancer  (ICD-V76.51) 11)  Preventive Health Care  (ICD-V70.0) 12)  CHF  (ICD-428.0) 13)  Dyspnea  (ICD-786.05) 14)  Anemia-nos  (ICD-285.9) 15)  Low Back Pain  (ICD-724.2) 16)  Allergic Rhinitis  (ICD-477.9) 17)  Nephrolithiasis, Hx of  (ICD-V13.01) 18)  Hypertension  (ICD-401.9) 19)  Family History Diabetes 1st Degree Relative  (ICD-V18.0) 20)  Migraine Headache  (ICD-346.90) 21)  Hyperlipidemia  (ICD-272.4) 22)  Glaucoma Nos  (ICD-365.9) 23)  Asthma  (ICD-493.90) 24)  Diabetic  Retinopathy  (ICD-250.50) 25)  Dm, Uncomplicated, Type II, Uncontrolled  (ICD-250.02) 26)  Morbid Obesity, Hx of  (ICD-V13.8)  Medications Prior to Update: 1)  Furosemide 20 Mg  Tabs (Furosemide) .Marland Kitchen.. 1 By Mouth Once Daily 2)  Crestor 20 Mg  Tabs (Rosuvastatin Calcium) .... 1/2 By Mouth Once Daily 3)  Prometrium 100 Mg  Caps (Progesterone Micronized) .... One By Mouth Once Daily 4)  Menest 0.3 Mg  Tabs (Esterified Estrogens) .... One By Mouth Once Daily 5)  One-A-Day Extras Antioxidant   Caps (Multiple Vitamins-Minerals) .... One By Mouth Once Daily 6)  Lantus For Opticlik 100 Unit/ml  Soln (Insulin Glargine) .... Inject 45 Units Subcutaneously Once Daily 7)  Optive 0.5-0.9 %  Soln (Carboxymethylcellul-Glycerin) .... As Needed As Directed 8)  Novolog Flexpen 100 Unit/ml Soln (Insulin Aspart) .Marland Kitchen.. 15 Units Subcutaneously Once Daily 9)  Bd U/f Short Pen Needle 31g X 8 Mm  Misc (Insulin Pen Needle) .... Use Two Times A Day As Directed---Dx Code 250.02 10)  Freestyle Lite Test  Strp (Glucose Blood) .... Use As Directed 11)  Glucophage Xr 500 Mg Xr24h-Tab (Metformin Hcl) .... 4 By Mouth Qam 12)  Adult Aspirin Ec Low Strength 81 Mg Tbec (Aspirin) .Marland Kitchen.. 1 By Mouth Once Daily 13)  Propoxyphene N-Apap 100-325 Mg Tabs (Propoxyphene N-Apap) .Marland Kitchen.. 1 By Mouth Q 6 Hrs  As Needed 14)  Alprazolam 0.25 Mg Tabs (Alprazolam) .Marland Kitchen.. 1 By Mouth Three Times A Day As Needed 15)  Qvar 80 Mcg/act Aers (Beclomethasone Dipropionate) .Marland Kitchen.. 1 Puff Two Times A Day - Gave Sample Only Today  Current Medications (verified): 1)  Furosemide 20 Mg  Tabs (Furosemide) .Marland Kitchen.. 1 By Mouth Once Daily 2)  Crestor 20 Mg  Tabs (Rosuvastatin Calcium) .... 1/2 By Mouth Once Daily 3)  Prometrium 100 Mg  Caps (Progesterone Micronized) .... One By Mouth Once Daily 4)  Menest 0.3 Mg  Tabs (Esterified Estrogens) .... One By Mouth Once Daily 5)  One-A-Day Extras Antioxidant   Caps (Multiple Vitamins-Minerals) .... One By Mouth Once Daily 6)  Lantus  For Opticlik 100 Unit/ml  Soln (Insulin Glargine) .... Inject 45 Units Subcutaneously Once Daily 7)  Optive 0.5-0.9 %  Soln (Carboxymethylcellul-Glycerin) .... As Needed As Directed 8)  Novolog Flexpen 100 Unit/ml Soln (Insulin Aspart) .Marland Kitchen.. 15 Units Subcutaneously Once Daily 9)  Bd U/f Short Pen Needle 31g X 8 Mm  Misc (Insulin Pen Needle) .... Use Two Times A Day As Directed---Dx Code 250.02 10)  Freestyle Lite Test  Strp (Glucose Blood) .... Use As Directed 11)  Glucophage Xr 500 Mg Xr24h-Tab (Metformin Hcl) .... 4 By Mouth Qam 12)  Adult Aspirin Ec Low Strength 81 Mg Tbec (Aspirin) .Marland Kitchen.. 1 By Mouth Once Daily 13)  Alprazolam 0.25 Mg Tabs (Alprazolam) .Marland Kitchen.. 1 By Mouth Three Times A Day As Needed 14)  Qvar 80 Mcg/act Aers (Beclomethasone Dipropionate) .Marland Kitchen.. 1 Puff Two Times A Day  Allergies (verified): No Known Drug Allergies  Past History:  Past Medical History: Last updated: 05/21/2009 1. Type 2 diabetes, poorly controlled on insulin therapy. (1972) 2. Hypertension 3. Fiabetic retinopathy.s/p laser tx bilat 4. Asthma. 5. Glaucoma. 6. Hyperlipidemia. (2 years) 7. Migraine headache. 8. Nephrolithiasis 9. Allergic rhinitis 10.Low back pain/spinal stenosis 11.Anemia-NOS 12Hyperlipidemia 13Bilat carpal tunnell syndrome  Past Surgical History: Last updated: 05/20/2009 Cataract extraction Cholecystectomy Thoracic fusion Lumbar disc surgury Tubal ligation  Family History: Last updated: August 12, 2007 Mother deceased at age 56. She had diabetes.  Father deceased age 39 secondary to lung cancer.  Multiple siblings with Type 2 diabetes.   Social History: Last updated: 06/06/2008 She is married to her second husband for about 24 years. He has MS and is in wheelchair. She has five grown children from her first marriage  Retired Actor.  Never Smoked Alcohol use-no  Risk Factors: Exercise: no (08/20/2008)  Risk Factors: Smoking Status: never (01/18/2008) Passive  Smoke Exposure: no (08/20/2008)  Review of Systems  The patient denies anorexia, fever, weight loss, weight gain, vision loss, decreased hearing, hoarseness, syncope, dyspnea on exertion, peripheral edema, prolonged cough, headaches, hemoptysis, abdominal pain, melena, hematochezia, severe indigestion/heartburn, hematuria, incontinence, muscle weakness, suspicious skin lesions, transient blindness, difficulty walking, depression, unusual weight change, abnormal bleeding, enlarged lymph nodes, and angioedema.         all otherwise negative per pt - except for occasional nausea  Physical Exam  General:  alert and overweight-appearing.   Head:  normocephalic and atraumatic.   Eyes:  vision grossly intact, pupils equal, and pupils round.   Ears:  R ear normal and L ear normal.   Nose:  no external deformity and no nasal discharge.   Mouth:  no gingival abnormalities and pharynx pink and moist.   Neck:  supple and no masses.   Lungs:  normal respiratory effort and normal breath sounds.   Heart:  normal rate and  regular rhythm.   Abdomen:  soft, non-tender, and normal bowel sounds.   Msk:  no joint tenderness and no joint swelling.   Extremities:  no edema, no erythema  Neurologic:  cranial nerves II-XII intact and strength normal in all extremities.   Skin:  color normal and no rashes.     Impression & Recommendations:  Problem # 1:  Preventive Health Care (ICD-V70.0)  Overall doing well, age appropriate education and counseling updated and referral for appropriate preventive services done unless declined, immunizations up to date or declined, diet counseling done if overweight, urged to quit smoking if smokes , most recent labs reviewed and current ordered if appropriate, ecg reviewed or declined (interpretation per ECG scanned in the EMR if done); information regarding Medicare Prevention requirements given if appropriate  Orders: TLB-BMP (Basic Metabolic Panel-BMET)  (80048-METABOL) TLB-CBC Platelet - w/Differential (85025-CBCD) TLB-Hepatic/Liver Function Pnl (80076-HEPATIC) TLB-Lipid Panel (80061-LIPID) TLB-TSH (Thyroid Stimulating Hormone) (84443-TSH) TLB-Udip ONLY (81003-UDIP)  Complete Medication List: 1)  Furosemide 20 Mg Tabs (Furosemide) .Marland Kitchen.. 1 by mouth once daily 2)  Crestor 20 Mg Tabs (Rosuvastatin calcium) .... 1/2 by mouth once daily 3)  Prometrium 100 Mg Caps (Progesterone micronized) .... One by mouth once daily 4)  Menest 0.3 Mg Tabs (Esterified estrogens) .... One by mouth once daily 5)  One-a-day Extras Antioxidant Caps (Multiple vitamins-minerals) .... One by mouth once daily 6)  Lantus For Opticlik 100 Unit/ml Soln (Insulin glargine) .... Inject 45 units subcutaneously once daily 7)  Optive 0.5-0.9 % Soln (Carboxymethylcellul-glycerin) .... As needed as directed 8)  Novolog Flexpen 100 Unit/ml Soln (Insulin aspart) .Marland Kitchen.. 15 units subcutaneously once daily 9)  Bd U/f Short Pen Needle 31g X 8 Mm Misc (Insulin pen needle) .... Use two times a day as directed---dx code 250.02 10)  Freestyle Lite Test Strp (Glucose blood) .... Use as directed 11)  Glucophage Xr 500 Mg Xr24h-tab (Metformin hcl) .... 4 by mouth qam 12)  Adult Aspirin Ec Low Strength 81 Mg Tbec (Aspirin) .Marland Kitchen.. 1 by mouth once daily 13)  Alprazolam 0.25 Mg Tabs (Alprazolam) .Marland Kitchen.. 1 by mouth three times a day as needed 14)  Qvar 80 Mcg/act Aers (Beclomethasone dipropionate) .Marland Kitchen.. 1 puff two times a day  Other Orders: Flu Vaccine 61yrs + (42595) Administration Flu vaccine - MCR (G0008) TLB-A1C / Hgb A1C (Glycohemoglobin) (83036-A1C) TLB-Microalbumin/Creat Ratio, Urine (82043-MALB)  Patient Instructions: 1)  you had the flu shot today 2)  Please go to the Lab in the basement for your blood and/or urine tests today  3)  Please schedule a follow-up appointment in 6 months with : 4)  HbgA1C prior to visit, ICD-9: 250.02 5)  BMP prior to visit, ICD-9: 6)  Lipid Panel prior to  visit, ICD-9: Prescriptions: QVAR 80 MCG/ACT AERS (BECLOMETHASONE DIPROPIONATE) 1 puff two times a day  #1 x 11   Entered and Authorized by:   Corwin Levins MD   Signed by:   Corwin Levins MD on 08/28/2009   Method used:   Faxed to ...       [facility] (retail)       [address]       [place], [place]  [address]       Ph: [phone]       Fax: [phone]   RxID:   [id number]     Flu Vaccine Consent Questions     Do you have a history of severe  allergic reactions to this vaccine? no    Any prior history of allergic reactions to egg and/or gelatin? no    Do you have a sensitivity to the preservative Thimersol? no    Do you have a past history of Guillan-Barre Syndrome? no    Do you currently have an acute febrile illness? no    Have you ever had a severe reaction to latex? no    Vaccine information given and explained to patient? yes    Are you currently pregnant? no    Lot Number:[id number]   Exp Date:01/30/2010   Site Given  Left Deltoid IMflu

## 2010-09-02 NOTE — Progress Notes (Signed)
Summary: rx request  Phone Note Call from Patient   Caller: Patient [phone] Summary of Call: pt called requesting refills of her meds. RX sent, pt informed. Initial call taken by: Margaret Pyle, CMA,  October 04, 2009 9:40 AM    Prescriptions: NOVOLOG FLEXPEN 100 UNIT/ML SOLN (INSULIN ASPART) 15 units subcutaneously once daily  #36mth x 11   Entered by:   Margaret Pyle, CMA   Authorized by:   Corwin Levins MD   Signed by:   Margaret Pyle, CMA on 10/04/2009   Method used:   Electronically to        [facility] [phone]* (retail)       [address]       [place], [place]  [address]       Ph: [phone] or [phone]       Fax: [phone]   RxID:   [id number] LANTUS FOR OPTICLIK 100 UNIT/ML  SOLN (INSULIN GLARGINE) inject 45 units subcutaneously once daily  #29mth x 11   Entered by:   Margaret Pyle, CMA   Authorized by:   Corwin Levins MD   Signed by:   Margaret Pyle, CMA on 10/04/2009   Method used:   Electronically to        [facility] [phone]* (retail)       [address]       [place], [place]  [address]       Ph: [phone] or [phone]       Fax: [phone]   RxID:   [id number] QVAR 80 MCG/ACT AERS (BECLOMETHASONE DIPROPIONATE) 1 puff two times a day  #1 x 11   Entered by:   Margaret Pyle, CMA   Authorized by:   Corwin Levins MD   Signed by:   Margaret Pyle, CMA on 10/04/2009   Method used:   Electronically to        [facility] [phone]* (retail)       [address]       [place], [place]  [address]       Ph: [phone] or [phone]       Fax: [phone]   RxID:   [id number] CRESTOR 20 MG  TABS (ROSUVASTATIN CALCIUM) 1/2 by mouth once daily  #30 x 11   Entered by:   Margaret Pyle, CMA   Authorized by:   Corwin Levins MD   Signed by:   Margaret Pyle, CMA on 10/04/2009   Method used:   Electronically to        [facility] [phone]* (retail)       [address]       [place], [place]  [address]       Ph: [phone] or [phone]       Fax: [phone]   RxID:   [id number] PROMETRIUM 100 MG  CAPS (PROGESTERONE MICRONIZED) one by mouth once daily  #30 x 11   Entered by:   Margaret Pyle, CMA   Authorized by:   Corwin Levins MD   Signed by:   Margaret Pyle, CMA on 10/04/2009   Method used:   Electronically to        [facility] [phone]* (retail)       [address]       [place], [place]  [address]       Ph: [phone] or [phone]  Fax: [phone]   RxID:   [id number] MENEST 0.3 MG  TABS (ESTERIFIED ESTROGENS) one by mouth once daily  #30 x 11   Entered by:   Margaret Pyle, CMA   Authorized by:   Corwin Levins MD   Signed by:   Margaret Pyle, CMA on 10/04/2009   Method used:   Electronically to        [facility] [phone]* (retail)       [address]       [place], [place]  [address]       Ph: [phone] or [phone]       Fax: [phone]   RxID:   [id number]

## 2010-09-02 NOTE — Progress Notes (Signed)
  Phone Note Other Incoming   Caller: [facility] Summary of Call: pt was requesting her Glucophage, I gave [facility] verbal (over the phone) for glucophage RX 500mg  4 q am with 5 refills. OK. Initial call taken by: Ami Bullins CMA,  November 06, 2009 9:16 AM  Follow-up for Phone Call        to [facility] for routine reqeusts Follow-up by: Corwin Levins MD,  November 06, 2009 12:12 PM    Prescriptions: GLUCOPHAGE XR 500 MG XR24H-TAB (METFORMIN HCL) 4 by mouth qam  #120 x 5   Entered by:   Scharlene Gloss   Authorized by:   Corwin Levins MD   Signed by:   Scharlene Gloss on 11/06/2009   Method used:   Faxed to ...       [facility] [phone]* (retail)       [address]       [place], [place]  [address]       Ph: [phone] or [phone]       Fax: [phone]   RxID:   [id number]

## 2010-09-02 NOTE — Assessment & Plan Note (Signed)
Summary: NO ENERGY /ABOUT 1 MTH--STC   Vital Signs:  Patient profile:   70 year old female Height:      61 inches Weight:      215.50 pounds BMI:     40.87 O2 Sat:      97 % on Room air Temp:     98.6 degrees F oral Pulse rate:   83 / minute BP sitting:   128 / 60  (left arm) Cuff size:   large  Vitals Entered ByZella Ball Ewing (January 21, 2010 8:24 AM)  O2 Flow:  Room air CC: No Energy/RE   Primary Care Provider:  Oliver Barre, MD  CC:  No Energy/RE.  History of Present Illness: here with episodes of increased fatigue and low appetitie which seems to last a few days in a row, then a good day, then bad days again;  overall lost 6 lbs from jan 2011;  no night sweats; no one at home to tell her if she snores at night;  gets upa t 5 am by the alarm and keeps a schedule;  no real early morning awakening ;  no daytime somnolence but has to lie to rest quite a few days in the past months;  does not think she is depressed;  no suicidal ideation.  curretnly lives a large house and wants to move ;  has friends that see her weekly at least;  has not been able to go back to church since Vevelyn Royals died 01/23/24with aspiration pna (at 70yo);  Pt denies CP,  wheezing, orthopnea, pnd, worsening LE edema, palps, dizziness or syncope .  as some sob/doe with walking depending on effort;  can walk to mailbox once per day but not more due to sob and fatigue.  No trouble with ADL's except tying shoes due to spine disease - s/p spine surgury 1998.  Has pedal excercise machine - uses occasionally.  Last work 1991 - disable due to back pain.  Pt denies new neuro symptoms such as headache, facial or extremity weakness, although has more trouble with grip strength and cant open jars with tight tops.  Has chronic pain daily - take alleves daily to lower back, mild to mod at this time, worse to get up out of chair and bending,   but no new radicaular pain, or LE pain, weak, numb,  falls, or gait change.  No fever , cough, GU  symptoms  or abd pain but feels warm when the fagitu attacks seems to come on.  "I cant eat normally" per pt - such as eggs and milk in the am, but can eat it later in the day.  No trouble with swallowing ; but has intermittent nausea and few vomiting twice in the past month.  Pt denies polydipsia, polyuria, or low sugar symptoms such as shakiness improved with eating.  Overall good compliance with meds, trying to follow low chol, DM diet.  Antacids OTC seems to help the nausea.   Wants to look for part time work soon to keep busy in the fall.  Problems Prior to Update: 1)  Chronic Pain Syndrome  (ICD-338.4) 2)  Dyspepsia  (ICD-536.8) 3)  Dyspnea On Exertion  (ICD-786.09) 4)  Fatigue  (ICD-780.79) 5)  Preventive Health Care  (ICD-V70.0) 6)  Myocardial Perfusion Scan, With Stress Test, Abnormal  (ICD-794.39) 7)  Chest Pain  (ICD-786.50) 8)  Nausea  (ICD-787.02) 9)  Shoulder Pain, Bilateral  (ICD-719.41) 10)  Joint Effusion, Left Knee  (  ICD-719.06) 11)  Asthma, With Acute Exacerbation  (ICD-493.92) 12)  Abdominal Pain, Epigastric  (ICD-789.06) 13)  Preoperative Visit  (ICD-V72.84) 14)  Screening Colorectal-cancer  (ICD-V76.51) 15)  Preventive Health Care  (ICD-V70.0) 16)  CHF  (ICD-428.0) 17)  Dyspnea  (ICD-786.05) 18)  Anemia-nos  (ICD-285.9) 19)  Low Back Pain  (ICD-724.2) 20)  Allergic Rhinitis  (ICD-477.9) 21)  Nephrolithiasis, Hx of  (ICD-V13.01) 22)  Hypertension  (ICD-401.9) 23)  Family History Diabetes 1st Degree Relative  (ICD-V18.0) 24)  Migraine Headache  (ICD-346.90) 25)  Hyperlipidemia  (ICD-272.4) 26)  Glaucoma Nos  (ICD-365.9) 27)  Asthma  (ICD-493.90) 28)  Diabetic Retinopathy  (ICD-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 1 By Mouth Once Daily 2)  Crestor 20 Mg  Tabs (Rosuvastatin Calcium) .... 1/2 By Mouth Once Daily 3)   Prometrium 100 Mg  Caps (Progesterone Micronized) .... One By Mouth Once Daily 4)  Menest 0.3 Mg  Tabs (Esterified Estrogens) .... One By Mouth Once Daily 5)  One-A-Day Extras Antioxidant   Caps (Multiple Vitamins-Minerals) .... One By Mouth Once Daily 6)  Lantus For Opticlik 100 Unit/ml  Soln (Insulin Glargine) .... Inject 45 Units Subcutaneously Once Daily 7)  Optive 0.5-0.9 %  Soln (Carboxymethylcellul-Glycerin) .... As Needed As Directed 8)  Novolog Flexpen 100 Unit/ml Soln (Insulin Aspart) .Marland Kitchen.. 15 Units Subcutaneously Once Daily 9)  Bd U/f Short Pen Needle 31g X 8 Mm  Misc (Insulin Pen Needle) .... Use Two Times A Day As Directed---Dx Code 250.02 10)  Freestyle Lite Test  Strp (Glucose Blood) .... Use As Directed 11)  Glucophage Xr 500 Mg Xr24h-Tab (Metformin Hcl) .... 4 By Mouth Qam 12)  Adult Aspirin Ec Low Strength 81 Mg Tbec (Aspirin) .Marland Kitchen.. 1 By Mouth Once Daily 13)  Alprazolam 0.25 Mg Tabs (Alprazolam) .Marland Kitchen.. 1 By Mouth Three Times A Day As Needed 14)  Qvar 80 Mcg/act Aers (Beclomethasone Dipropionate) .Marland Kitchen.. 1 Puff Two Times A Day  Current Medications (verified): 1)  Furosemide 20 Mg  Tabs (Furosemide) .Marland Kitchen.. 1 By Mouth Once Daily 2)  Crestor 20 Mg  Tabs (Rosuvastatin Calcium) .... 1/2 By Mouth Once Daily 3)  Prometrium 100 Mg  Caps (Progesterone Micronized) .... One By Mouth Once Daily 4)  Menest 0.3 Mg  Tabs (Esterified Estrogens) .... One By Mouth Once Daily 5)  One-A-Day Extras Antioxidant   Caps (Multiple Vitamins-Minerals) .... One By Mouth Once Daily 6)  Lantus For Opticlik 100 Unit/ml  Soln (Insulin Glargine) .... Inject 45 Units Subcutaneously Once Daily 7)  Optive 0.5-0.9 %  Soln (Carboxymethylcellul-Glycerin) .... As Needed As Directed 8)  Novolog Flexpen 100 Unit/ml Soln (Insulin Aspart) .Marland Kitchen.. 15 Units Subcutaneously Once Daily 9)  Bd U/f Short Pen Needle 31g X 8 Mm  Misc (Insulin Pen Needle) .... Use Two Times A Day As Directed---Dx Code 250.02 10)  Freestyle Lite Test  Strp  (Glucose Blood) .... Use As Directed 11)  Glucophage Xr 500 Mg Xr24h-Tab (Metformin Hcl) .... 4 By Mouth Qam 12)  Adult Aspirin Ec Low Strength 81 Mg Tbec (Aspirin) .Marland Kitchen.. 1 By Mouth Once Daily 13)  Alprazolam 0.25 Mg Tabs (Alprazolam) .Marland Kitchen.. 1 By Mouth Three Times A Day As Needed 14)  Qvar 80 Mcg/act Aers (Beclomethasone Dipropionate) .Marland Kitchen.. 1 Puff Two Times A Day 15)  Omeprazole 20 Mg Cpdr (Omeprazole) .Marland Kitchen.. 1po Once Daily  Allergies (  verified): No Known Drug Allergies  Past History:  Past Medical History: Last updated: 05/21/2009 1. Type 2 diabetes, poorly controlled on insulin therapy. (1972) 2. Hypertension 3. Fiabetic retinopathy.s/p laser tx bilat 4. Asthma. 5. Glaucoma. 6. Hyperlipidemia. (2 years) 7. Migraine headache. 8. Nephrolithiasis 9. Allergic rhinitis 10.Low back pain/spinal stenosis 11.Anemia-NOS 12Hyperlipidemia 13Bilat carpal tunnell syndrome  Past Surgical History: Last updated: 05/20/2009 Cataract extraction Cholecystectomy Thoracic fusion Lumbar disc surgury Tubal ligation  Family History: Last updated: August 11, 2007 Mother deceased at age 49. She had diabetes.  Father deceased age 45 secondary to lung cancer.  Multiple siblings with Type 2 diabetes.   Social History: Last updated: 01/21/2010 She is married to her second husband for about 24 years. 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         all otherwise negative per pt -  no overt bleeding, often does not take the ASA, and still declines card cath as recommended per Dr Antoine Poche sept 2010, or even PFT's  Physical Exam  General:  alert and overweight-appearing.   Head:  normocephalic and atraumatic.   Eyes:  vision grossly intact, pupils equal, and pupils round.   Ears:  R ear normal and L ear normal.   Nose:  no external deformity and no nasal discharge.   Mouth:  no gingival abnormalities and pharynx pink and moist.   Neck:  supple and no masses.   Lungs:  normal respiratory effort and normal breath sounds.   Heart:  normal rate and regular rhythm.   Abdomen:  soft, non-tender, and normal bowel sounds.   Msk:  no spine tender, or paravertebral tender Extremities:  no edema, no erythema  Neurologic:  cranial nerves II-XII intact and strength normal in all extremities.   Skin:  color normal and no rashes.   Psych:  depressed affect and slightly anxious.     Impression & Recommendations:  Problem # 1:  FATIGUE (ICD-780.79)  pt consents to lab work today - will check routine labs and  CXR;  it should be noted that hx, PE, lab review, and counseling pt was > 45 min today  Orders: TLB-BMP (Basic Metabolic Panel-BMET) (80048-METABOL) TLB-Sedimentation Rate (ESR) (85652-ESR) TLB-TSH (Thyroid Stimulating Hormone) (84443-TSH) TLB-Udip ONLY (81003-UDIP)  Problem # 2:  DYSPNEA ON EXERTION (ICD-786.09)  Her updated medication list for this problem includes:    Furosemide 20 Mg Tabs (Furosemide) .Marland Kitchen... 1 by mouth  once daily    Qvar 80 Mcg/act Aers (Beclomethasone dipropionate) .Marland Kitchen... 1 puff two times a day likely multifactorial, including deconditioning, obesity, and cant r/o CV etiology;  pt adamant she is not wanting other  more indepth testing such as echo, cath, PFT's, ECG or other  Orders: T-2 View CXR, Same Day (71020.5TC)  Problem # 3:  DYSPEPSIA (ICD-536.8) mild with  a few lbs recent wt loss of ? clinical significance  - will add PPI  Problem # 4:  MYOCARDIAL PERFUSION SCAN, WITH STRESS TEST, ABNORMAL (ICD-794.39) no CP or pressure per pt , but I advised to see or call Dr hochrein in f/u which she is declinging at this time; at least should try to resume the ASA daily in light of taking the PPI  Problem # 5:  CHRONIC PAIN SYNDROME (ICD-338.4) with likely assoc depression, but poor insight I think, and pt declines trial of cymbalta or other SSRI due to cost  Problem # 6:  HYPERTENSION (ICD-401.9)  Her updated medication list for this problem includes:    Furosemide 20 Mg Tabs (Furosemide) .Marland Kitchen... 1 by mouth once daily  BP today: 128/60 Prior BP: 112/70 (08/28/2009)  Labs Reviewed: K+: 4.5 (08/28/2009) Creat: : 0.9 (08/28/2009)   Chol: 131 (08/28/2009)   HDL: 50.80 (08/28/2009)   LDL: 66 (08/28/2009)   TG: 69.0 (08/28/2009) stable overall by hx and exam, ok to continue meds/tx as is   Problem # 7:  CHF (ICD-428.0)  Her updated medication list for this problem includes:    Furosemide 20 Mg Tabs (Furosemide) .Marland Kitchen... 1 by mouth once daily    Adult Aspirin Ec Low Strength 81 Mg Tbec (Aspirin) .Marland Kitchen... 1 by mouth once daily  Orders: TLB-BNP (B-Natriuretic Peptide) (83880-BNPR) stable overall by hx and exam, ok to continue meds/tx as is   Problem # 8:  DM, UNCOMPLICATED, TYPE II, UNCONTROLLED (ICD-250.02)  Her updated medication list for this problem includes:    Lantus For Opticlik 100 Unit/ml Soln (Insulin glargine) ..... Inject 45 units subcutaneously once daily    Novolog Flexpen 100  Unit/ml Soln (Insulin aspart) .Marland KitchenMarland KitchenMarland KitchenMarland Kitchen 15 units subcutaneously once daily    Glucophage Xr 500 Mg Xr24h-tab (Metformin hcl) .Marland KitchenMarland KitchenMarland KitchenMarland Kitchen 4 by mouth qam    Adult Aspirin Ec Low Strength 81 Mg Tbec (Aspirin) .Marland Kitchen... 1 by mouth once daily  Orders: TLB-A1C / Hgb A1C (Glycohemoglobin) (83036-A1C) TLB-Lipid Panel (80061-LIPID)  Labs Reviewed: Creat: 0.9 (08/28/2009)    Reviewed HgBA1c results: 7.9 (08/28/2009)  7.5 (02/26/2009) stable overall by hx and exam, ok to continue meds/tx as is ; Pt to cont DM diet, excercise, wt loss efforts; to check labs today   Complete Medication List: 1)  Furosemide 20 Mg Tabs (Furosemide) .Marland Kitchen.. 1 by mouth once daily 2)  Crestor 20 Mg Tabs (Rosuvastatin calcium) .... 1/2 by mouth once daily 3)  Prometrium 100 Mg Caps (Progesterone micronized) .... One by mouth once daily 4)  Menest 0.3 Mg Tabs (Esterified estrogens) .... One by mouth once daily 5)  One-a-day Extras Antioxidant Caps (Multiple vitamins-minerals) .... One by mouth once daily 6)  Lantus For Opticlik 100 Unit/ml Soln (Insulin glargine) .... Inject 45 units subcutaneously once daily 7)  Optive 0.5-0.9 % Soln (Carboxymethylcellul-glycerin) .... As needed as directed 8)  Novolog Flexpen 100 Unit/ml Soln (Insulin aspart) .Marland Kitchen.. 15 units subcutaneously once daily 9)  Bd U/f Short Pen Needle 31g X 8 Mm Misc (Insulin pen needle) .... Use two times a day as directed---dx code 250.02 10)  Freestyle Lite Test Strp (Glucose blood) .... Use as directed 11)  Glucophage Xr 500 Mg Xr24h-tab (Metformin hcl) .... 4 by mouth qam 12)  Adult Aspirin Ec Low Strength 81 Mg Tbec (Aspirin) .Marland Kitchen.. 1 by  mouth once daily 13)  Alprazolam 0.25 Mg Tabs (Alprazolam) .Marland Kitchen.. 1 by mouth three times a day as needed 14)  Qvar 80 Mcg/act Aers (Beclomethasone dipropionate) .Marland Kitchen.. 1 puff two times a day 15)  Omeprazole 20 Mg Cpdr (Omeprazole) .Marland Kitchen.. 1po once daily  Other Orders: TLB-IBC Pnl (Iron/FE;Transferrin) (83550-IBC) TLB-B12 + Folate Pnl  (82746_82607-B12/FOL) TLB-CBC Platelet - w/Differential (85025-CBCD)  Patient Instructions: 1)  Please go to the Lab in the basement for your blood and/or urine tests today 2)  Please go to Radiology in the basement level for your X-Ray today  3)  Please take all new medications as prescribed  - the generic prilosec for the stomach symptoms 4)  Continue all previous medications as before this visit  5)  , including taking the Aspirin every day please 6)  Please make appointment with Dr Antoine Poche as you likley still need to have the heart checked more closely 7)  Please schedule a follow-up appointment in 6 months with CPX labs and: 8)  HbgA1C prior to visit, ICD-9:250.02 9)  Urine Microalbumin prior to visit, ICD-9: Prescriptions: OMEPRAZOLE 20 MG CPDR (OMEPRAZOLE) 1po once daily  #90 x 3   Entered and Authorized by:   Corwin Levins MD   Signed by:   Corwin Levins MD on 01/21/2010   Method used:   Print then Give to Patient   RxID:   [id number]

## 2010-09-02 NOTE — Progress Notes (Signed)
  Phone Note Refill Request Message from:  Pharmacy on Dec 31, 2009 3:03 PM  Refills Requested: Medication #1:  BD U/F SHORT PEN NEEDLE 31G X 8 MM  MISC Use two times a day as directed---dx code 250.02   Dosage confirmed as above?Dosage Confirmed   Last Refilled: 09/18/2009   Notes: [facility] Initial call taken by: Scharlene Gloss,  Dec 31, 2009 3:04 PM    Prescriptions: BD U/F SHORT PEN NEEDLE 31G X 8 MM  MISC (INSULIN PEN NEEDLE) Use two times a day as directed---dx code 250.02  #100 x 3   Entered by:   Scharlene Gloss   Authorized by:   Corwin Levins MD   Signed by:   Scharlene Gloss on 12/31/2009   Method used:   Faxed to ...       [facility] [address] [phone]* (retail)       [address]       [place], [place]  [address]       Ph: [phone] or [phone]       Fax: [phone]   RxID:   [id number]

## 2010-09-02 NOTE — Assessment & Plan Note (Signed)
Summary: flu shot/john/cd  Nurse Visit   Vital Signs:  Patient profile:   70 year old female Temp:     97.0 degrees F oral  Vitals Entered By: Lanier Prude, CMA(AAMA) (June 10, 2010 10:09 AM)  Allergies: No Known Drug Allergies  Orders Added: 1)  Flu Vaccine 76yrs + MEDICARE PATIENTS [Q2039] 2)  Administration Flu vaccine - MCR [G0008]         Flu Vaccine Consent Questions     Do you have a history of severe allergic reactions to this vaccine? no    Any prior history of allergic reactions to egg and/or gelatin? no    Do you have a sensitivity to the preservative Thimersol? no    Do you have a past history of Guillan-Barre Syndrome? no    Do you currently have an acute febrile illness? no    Have you ever had a severe reaction to latex? no    Vaccine information given and explained to patient? yes    Are you currently pregnant? no    Lot Number:[id number]   Exp Date:01/31/2011   Site Given  RightDeltoid IM Lanier Prude, [facility])  June 10, 2010 10:09 AM

## 2010-09-04 NOTE — Assessment & Plan Note (Signed)
Summary: 6 MO ROV /NWS  #   Vital Signs:  Patient profile:   70 year old female Height:      61 inches Weight:      218.25 pounds BMI:     41.39 O2 Sat:      98 % on Room air Temp:     98.4 degrees F oral Pulse rate:   85 / minute BP sitting:   132 / 62  (left arm) Cuff size:   large  Vitals Entered By: Zella Ball Ewing CMA Duncan Dull) (July 24, 2010 1:49 PM)  O2 Flow:  Room air  Preventive Care Screening     declines colonoscopy  CC: 6 month ROV/RE   Primary Care Provider:  Oliver Barre, MD  CC:  6 month ROV/RE.  History of Present Illness: here for wellness, overall doing well,  out of PPI for one month with increased nausea and upper abd tender without dysphagia, vomiting, wt loss, other abd pain, or blood.  Has had several loose stools as well with increased fiber supplements over the same time.  Pt denies CP, worsening sob, doe, wheezing, orthopnea, pnd, worsening LE edema, palps, dizziness or syncope  Pt denies new neuro symptoms such as headache, facial or extremity weakness  Pt denies polydipsia, polyuria, or low sugar symptoms such as shakiness improved with eating.  Overall good compliance with meds, trying to follow low chol, DM diet, wt stable, little excercise however  No fever, wt loss, night sweats, loss of appetite or other constitutional symptoms   Overall good compliance with meds, and good tolerability, including the metformin  but has been taking since last visit without GI upset.  Denies worsening depressive symptoms, suicidal ideation, or panic.   Pt states good ability with ADL's, low fall risk, home safety reviewed and adequate, no significant change in hearing or vision, trying to follow lower chol diet, and occasionally active only with regular excercise.   Preventive Screening-Counseling & Management      Drug Use:  no.    Problems Prior to Update: 1)  Chronic Pain Syndrome  (ICD-338.4) 2)  Dyspepsia  (ICD-536.8) 3)  Dyspnea On Exertion  (ICD-786.09) 4)   Fatigue  (ICD-780.79) 5)  Preventive Health Care  (ICD-V70.0) 6)  Myocardial Perfusion Scan, With Stress Test, Abnormal  (ICD-794.39) 7)  Chest Pain  (ICD-786.50) 8)  Nausea  (ICD-787.02) 9)  Shoulder Pain, Bilateral  (ICD-719.41) 10)  Joint Effusion, Left Knee  (ICD-719.06) 11)  Asthma, With Acute Exacerbation  (ICD-493.92) 12)  Abdominal Pain, Epigastric  (ICD-789.06) 13)  Preoperative Visit  (ICD-V72.84) 14)  Screening Colorectal-cancer  (ICD-V76.51) 15)  Preventive Health Care  (ICD-V70.0) 16)  CHF  (ICD-428.0) 17)  Dyspnea  (ICD-786.05) 18)  Anemia-nos  (ICD-285.9) 19)  Low Back Pain  (ICD-724.2) 20)  Allergic Rhinitis  (ICD-477.9) 21)  Nephrolithiasis, Hx of  (ICD-V13.01) 22)  Hypertension  (ICD-401.9) 23)  Family History Diabetes 1st Degree Relative  (ICD-V18.0) 24)  Migraine Headache  (ICD-346.90) 25)  Hyperlipidemia  (ICD-272.4) 26)  Glaucoma Nos  (ICD-365.9) 27)  Asthma  (ICD-493.90) 28)  Diabetic Retinopathy  (ICD-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 1 By Mouth Once Daily 2)  Crestor 20 Mg  Tabs (Rosuvastatin Calcium) .... 1/2 By Mouth Once Daily 3)  Prometrium 100 Mg  Caps (Progesterone Micronized) .... One By Mouth Once Daily  4)  Menest 0.3 Mg  Tabs (Esterified Estrogens) .... One By Mouth Once Daily 5)  One-A-Day Extras Antioxidant   Caps (Multiple Vitamins-Minerals) .... One By Mouth Once Daily 6)  Lantus For Opticlik 100 Unit/ml  Soln (Insulin Glargine) .... Inject 50 Units Subcutaneously Once Daily 7)  Optive 0.5-0.9 %  Soln (Carboxymethylcellul-Glycerin) .... As Needed As Directed 8)  Novolog Flexpen 100 Unit/ml Soln (Insulin Aspart) .Marland Kitchen.. 15 Units Subcutaneously Once Daily 9)  Bd U/f Short Pen Needle 31g X 8 Mm  Misc (Insulin Pen Needle) .... Use Two Times A Day As Directed---Dx Code 250.02 10)  Freestyle Lite Test   Strp (Glucose Blood) .... Use As Directed 11)  Glucophage Xr 500 Mg Xr24h-Tab (Metformin Hcl) .... 4 By Mouth Qam 12)  Adult Aspirin Ec Low Strength 81 Mg Tbec (Aspirin) .Marland Kitchen.. 1 By Mouth Once Daily 13)  Alprazolam 0.25 Mg Tabs (Alprazolam) .Marland Kitchen.. 1 By Mouth Three Times A Day As Needed 14)  Qvar 80 Mcg/act Aers (Beclomethasone Dipropionate) .Marland Kitchen.. 1 Puff Two Times A Day 15)  Omeprazole 20 Mg Cpdr (Omeprazole) .Marland Kitchen.. 1po Once Daily  Current Medications (verified): 1)  Furosemide 20 Mg  Tabs (Furosemide) .Marland Kitchen.. 1 By Mouth Once Daily 2)  Crestor 20 Mg  Tabs (Rosuvastatin Calcium) .... 1/2 By Mouth Once Daily 3)  Prometrium 100 Mg  Caps (Progesterone Micronized) .... One By Mouth Once Daily 4)  Menest 0.3 Mg  Tabs (Esterified Estrogens) .... One By Mouth Once Daily 5)  One-A-Day Extras Antioxidant   Caps (Multiple Vitamins-Minerals) .... One By Mouth Once Daily 6)  Lantus For Opticlik 100 Unit/ml  Soln (Insulin Glargine) .... Inject 50 Units Subcutaneously Once Daily 7)  Optive 0.5-0.9 %  Soln (Carboxymethylcellul-Glycerin) .... As Needed As Directed 8)  Novolog Flexpen 100 Unit/ml Soln (Insulin Aspart) .Marland Kitchen.. 15 Units Subcutaneously Once Daily 9)  Bd U/f Short Pen Needle 31g X 8 Mm  Misc (Insulin Pen Needle) .... Use Two Times A Day As Directed---Dx Code 250.02 10)  Freestyle Lite Test  Strp (Glucose Blood) .... Use As Directed 11)  Glucophage Xr 500 Mg Xr24h-Tab (Metformin Hcl) .... 4 By Mouth Qam 12)  Adult Aspirin Ec Low Strength 81 Mg Tbec (Aspirin) .Marland Kitchen.. 1 By Mouth Once Daily 13)  Alprazolam 0.25 Mg Tabs (Alprazolam) .Marland Kitchen.. 1 By Mouth Three Times A Day As Needed 14)  Qvar 80 Mcg/act Aers (Beclomethasone Dipropionate) .Marland Kitchen.. 1 Puff Two Times A Day 15)  Omeprazole 20 Mg Cpdr (Omeprazole) .Marland Kitchen.. 1po Once Daily  Allergies (verified): No Known Drug Allergies  Past History:  Past Medical History: Last updated: 05/21/2009 1. Type 2 diabetes, poorly controlled on insulin therapy. (1972) 2. Hypertension 3.  Fiabetic retinopathy.s/p laser tx bilat 4. Asthma. 5. Glaucoma. 6. Hyperlipidemia. (2 years) 7. Migraine headache. 8. Nephrolithiasis 9. Allergic rhinitis 10.Low back pain/spinal stenosis 11.Anemia-NOS 12Hyperlipidemia 13Bilat carpal tunnell syndrome  Past Surgical History: Last updated: 05/20/2009 Cataract extraction Cholecystectomy Thoracic fusion Lumbar disc surgury Tubal ligation  Family History: Last updated: 2007-08-16 Mother deceased at age 53. She had diabetes.  Father deceased age 51 secondary to lung cancer.  Multiple siblings with Type 2 diabetes.   Social History: Last updated: 07/24/2010 She is married to her second husband for about 24 years. 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         all otherwise negative per pt -  except for mild left ear tinnitus without pain, fever, hearing loss or vertigo for the past month  Physical Exam  General:  alert and overweight-appearing. not  ill appearing Head:  normocephalic and atraumatic.   Eyes:  vision grossly intact, pupils equal, and pupils round.   Ears:  left tm mild erythema,  R ear normal.  , canals clear Nose:  no external deformity and no nasal discharge.   Mouth:  no gingival abnormalities and pharynx pink and moist.  , but does have what appears to be mild chronic tonsillitis with whitish areas to the tonsil bialt mild enlarged Neck:  supple and no masses.   Lungs:  normal respiratory effort and normal breath sounds.   Heart:  normal rate and regular rhythm.   Abdomen:  soft and normal bowel sounds.  but with mild to mod epigastric tender, without guarding or rebound Msk:  no acute joint tenderness and no joint swelling.   Extremities:  no edema, no erythema  Neurologic:  cranial nerves II-XII intact and strength normal in all extremities.   Skin:  color normal and no rashes.   Psych:  not depressed appearing and slightly anxious.     Impression & Recommendations:  Problem # 1:  Preventive Health Care (ICD-V70.0) Overall doing well, age appropriate education and counseling updated, referral for preventive services and immunizations addressed, dietary counseling and smoking status adressed , most recent labs reviewed I have personally reviewed and have noted 1.The patient's medical and social history 2.Their use of alcohol, tobacco or illicit drugs 3.Their current medications and supplements 4. Functional ability including ADL's, fall risk, home safety risk, hearing & visual impairment  5.Diet and physical activities 6.Evidence for depression or mood disorders The patients weight, height, BMI  have been recorded in the chart I have made referrals, counseling and provided education to the patient based review of the above   Problem # 2:  ABDOMINAL PAIN, EPIGASTRIC (ICD-789.06) to re-start the PPI, also gave aciphex sample to take two times a day for 1 wk to start;  consider GI referral if not  improved  Problem # 3:  DIARRHEA (ICD-787.91) non watery loose stools, ? related to increased fiber - to reduce for now to see if helps;  again consider GI referral but no warning signs at this time such as pain (other than above), blood, wt loss, fever or other   Problem # 4:  HYPERTENSION (ICD-401.9)  Her updated medication list for this problem includes:    Furosemide 20 Mg Tabs (Furosemide) .Marland Kitchen... 1 by mouth once daily  BP today: 132/62 Prior BP: 128/60 (01/21/2010)  Labs Reviewed: K+: 4.7 (07/21/2010) Creat: : 1.1 (07/21/2010)   Chol: 124 (07/21/2010)   HDL: 47.30 (07/21/2010)  LDL: 63 (07/21/2010)   TG: 69.0 (07/21/2010) stable overall by hx and exam, ok to continue meds/tx as is   Problem # 5:  DM, UNCOMPLICATED, TYPE II, UNCONTROLLED (ICD-250.02)  Her updated medication list for this problem includes:    Lantus For Opticlik 100 Unit/ml Soln (Insulin glargine) ..... Inject 50 units subcutaneously once daily    Novolog Flexpen 100 Unit/ml Soln (Insulin aspart) .Marland KitchenMarland KitchenMarland KitchenMarland Kitchen 15 units subcutaneously once daily    Glucophage Xr 500 Mg Xr24h-tab (Metformin hcl) .Marland KitchenMarland KitchenMarland KitchenMarland Kitchen 4 by mouth qam    Adult Aspirin Ec Low Strength 81 Mg Tbec (Aspirin) .Marland Kitchen... 1 by mouth once daily  Labs Reviewed: Creat: 1.1 (07/21/2010)    Reviewed HgBA1c results: 7.8 (07/21/2010)  8.2 (01/21/2010) mild uncontrolled, pt prefers not change at this time due to pain above; Pt to cont DM diet, excercise, wt control efforts; to check labs next visit  Complete Medication List: 1)  Furosemide 20 Mg Tabs (Furosemide) .Marland Kitchen.. 1 by mouth once daily 2)  Crestor 20 Mg Tabs (Rosuvastatin calcium) .... 1/2 by mouth once daily 3)  Prometrium 100 Mg Caps (Progesterone micronized) .... One by mouth once daily 4)  Menest 0.3 Mg Tabs (Esterified estrogens) .... One by mouth once daily 5)  One-a-day Extras Antioxidant Caps (Multiple vitamins-minerals) .... One by mouth once daily 6)  Lantus For Opticlik 100 Unit/ml Soln (Insulin glargine)  .... Inject 50 units subcutaneously once daily 7)  Optive 0.5-0.9 % Soln (Carboxymethylcellul-glycerin) .... As needed as directed 8)  Novolog Flexpen 100 Unit/ml Soln (Insulin aspart) .Marland Kitchen.. 15 units subcutaneously once daily 9)  Bd U/f Short Pen Needle 31g X 8 Mm Misc (Insulin pen needle) .... Use two times a day as directed---dx code 250.02 10)  Freestyle Lite Test Strp (Glucose blood) .... Use as directed 11)  Glucophage Xr 500 Mg Xr24h-tab (Metformin hcl) .... 4 by mouth qam 12)  Adult Aspirin Ec Low Strength 81 Mg Tbec (Aspirin) .Marland Kitchen.. 1 by mouth once daily 13)  Alprazolam 0.25 Mg Tabs (Alprazolam) .Marland Kitchen.. 1 by mouth three times a day as needed 14)  Qvar 80 Mcg/act Aers (Beclomethasone dipropionate) .Marland Kitchen.. 1 puff two times a day 15)  Omeprazole 20 Mg Cpdr (Omeprazole) .Marland Kitchen.. 1po once daily  Patient Instructions: 1)  please call to schedule your mammogram (consider [facility] on [address], or [facility] on [address]) 2)  please take the aciphex samples at 2 pills per day for the stomach pain, then re-start the omeprazole 20 mg per day 3)  please reduce the fiber supplements as this might help the loose stools 4)  Continue all previous medications as before this visit  5)  Please schedule a follow-up appointment in 6 months with: 6)  BMP prior to visit, ICD-9: 250.02 7)  Lipid Panel prior to visit, ICD-9: 8)  HbgA1C prior to visit, ICD-9: Prescriptions: OMEPRAZOLE 20 MG CPDR (OMEPRAZOLE) 1po once daily  #90 x 3   Entered and Authorized by:   Corwin Levins MD   Signed by:   Corwin Levins MD on 07/24/2010   Method used:   Print then Give to Patient   RxID:   [id number]    Orders Added: 1)  Est. Patient 65& > [14782]

## 2010-09-04 NOTE — Assessment & Plan Note (Signed)
Summary: LEG PAIN/ MOSTLY R. L AND R ARM/ MAY GO TO UC OR ER/NWS   Vital Signs:  Patient profile:   70 year old female Height:      61 inches (154.94 cm) Weight:      218 pounds (99.09 kg) O2 Sat:      93 % on Room air Temp:     98.6 degrees F (37.00 degrees C) oral Pulse rate:   80 / minute BP sitting:   132 / 72  (left arm) Cuff size:   large  Vitals Entered By: Orlan Leavens RMA (August 18, 2010 11:00 AM)  O2 Flow:  Room air CC: (R) leg pain Is Patient Diabetic? Yes Did you bring your meter with you today? No Pain Assessment Patient in pain? yes     Location: (R) leg pain Type: sharp Onset of pain  pt states x's 1 week   Primary Care Provider:  Oliver Barre, MD  CC:  (R) leg pain.  History of Present Illness: c/o R side pain, esp leg - onse 1 week ago arm has improved but cont leg pain worse in evening, better in AM no precipitating trauma different than usual back pain symptoms  not relieved with otc analgesic balm no swelling or trouble walking  Current Medications (verified): 1)  Furosemide 20 Mg  Tabs (Furosemide) .Marland Kitchen.. 1 By Mouth Once Daily 2)  Crestor 20 Mg  Tabs (Rosuvastatin Calcium) .... 1/2 By Mouth Once Daily 3)  Prometrium 100 Mg  Caps (Progesterone Micronized) .... One By Mouth Once Daily 4)  Menest 0.3 Mg  Tabs (Esterified Estrogens) .... One By Mouth Once Daily 5)  One-A-Day Extras Antioxidant   Caps (Multiple Vitamins-Minerals) .... One By Mouth Once Daily 6)  Lantus For Opticlik 100 Unit/ml  Soln (Insulin Glargine) .... Inject 50 Units Subcutaneously Once Daily 7)  Optive 0.5-0.9 %  Soln (Carboxymethylcellul-Glycerin) .... As Needed As Directed 8)  Novolog Flexpen 100 Unit/ml Soln (Insulin Aspart) .Marland Kitchen.. 15 Units Subcutaneously Once Daily 9)  Bd U/f Short Pen Needle 31g X 8 Mm  Misc (Insulin Pen Needle) .... Use Two Times A Day As Directed---Dx Code 250.02 10)  Freestyle Lite Test  Strp (Glucose Blood) .... Use As Directed 11)  Glucophage Xr 500 Mg  Xr24h-Tab (Metformin Hcl) .... 4 By Mouth Qam 12)  Adult Aspirin Ec Low Strength 81 Mg Tbec (Aspirin) .Marland Kitchen.. 1 By Mouth Once Daily 13)  Alprazolam 0.25 Mg Tabs (Alprazolam) .Marland Kitchen.. 1 By Mouth Three Times A Day As Needed 14)  Qvar 80 Mcg/act Aers (Beclomethasone Dipropionate) .Marland Kitchen.. 1 Puff Two Times A Day 15)  Omeprazole 20 Mg Cpdr (Omeprazole) .Marland Kitchen.. 1po Once Daily  Allergies (verified): No Known Drug Allergies  Past History:  Past Medical History: 1. Type 2 diabetes, poorly controlled on insulin therapy. (1972)  2. Hypertension 3. Diabetic retinopathy.s/p laser tx bilat 4. Asthma. 5. Glaucoma. 6. Hyperlipidemia 7. Migraine headache. 8. Nephrolithiasis 9. Allergic rhinitis 10.Low back pain/spinal stenosis 11.Anemia-NOS 12Hyperlipidemia 13Bilat carpal tunnell syndrome  Review of Systems  The patient denies fever, chest pain, and headaches.    Physical Exam  General:  alert and overweight-appearing. not ill appearing Lungs:  normal respiratory effort and normal breath sounds.   Heart:  normal rate and regular rhythm.   Msk:  right shoulder: Full range of motion. full strength with testing rotator cuff. Positive impingement signs. Nontender to palpation. Neurovascularly intact  right knee: decreased range of motion, diffuse boggy synovitis. Tender to palpation on joint line.  Increased pain with weight bearing. Positive crepitus.    Impression & Recommendations:  Problem # 1:  ARTHRITIS, GENERALIZED (ICD-716.99)  add rx NSAID two times a day x 5 days and muscle relaxant two times a day x 5days, then each as needed erx done no injury hx and no swelling- note chronic pain syndrome overlapping current symptoms   Orders: Prescription Created Electronically [id number])  Problem # 2:  CHRONIC PAIN SYNDROME (ICD-338.4)  Complete Medication List: 1)  Furosemide 20 Mg Tabs (Furosemide) .Marland Kitchen.. 1 by mouth once daily 2)  Crestor 20 Mg Tabs (Rosuvastatin calcium) .... 1/2 by mouth once  daily 3)  Prometrium 100 Mg Caps (Progesterone micronized) .... One by mouth once daily 4)  Menest 0.3 Mg Tabs (Esterified estrogens) .... One by mouth once daily 5)  One-a-day Extras Antioxidant Caps (Multiple vitamins-minerals) .... One by mouth once daily 6)  Lantus For Opticlik 100 Unit/ml Soln (Insulin glargine) .... Inject 50 units subcutaneously once daily 7)  Optive 0.5-0.9 % Soln (Carboxymethylcellul-glycerin) .... As needed as directed 8)  Novolog Flexpen 100 Unit/ml Soln (Insulin aspart) .Marland Kitchen.. 15 units subcutaneously once daily 9)  Bd U/f Short Pen Needle 31g X 8 Mm Misc (Insulin pen needle) .... Use two times a day as directed---dx code 250.02 10)  Freestyle Lite Test Strp (Glucose blood) .... Use as directed 11)  Glucophage Xr 500 Mg Xr24h-tab (Metformin hcl) .... 4 by mouth qam 12)  Adult Aspirin Ec Low Strength 81 Mg Tbec (Aspirin) .Marland Kitchen.. 1 by mouth once daily 13)  Alprazolam 0.25 Mg Tabs (Alprazolam) .Marland Kitchen.. 1 by mouth three times a day as needed 14)  Qvar 80 Mcg/act Aers (Beclomethasone dipropionate) .Marland Kitchen.. 1 puff two times a day 15)  Naproxen 500 Mg Tabs (Naproxen) .Marland Kitchen.. 1 by mouth two times a day x 5 days, then as needed for pain 16)  Cyclobenzaprine Hcl 5 Mg Tabs (Cyclobenzaprine hcl) .Marland Kitchen.. 1 by mouth two times a day x 5 days, then as needed for spasm pain  Patient Instructions: 1)  it was good to see you today. 2)  take naprosyn and flexeril for pain as described - your prescriptions have been electronically submitted to your pharmacy. Please take as directed. Contact our office if you believe you're having problems with the medication(s).  3)  Please schedule a follow-up appointment in 2 weeks with dr. Jonny Ruiz, call sooner if problems.  Prescriptions: CYCLOBENZAPRINE HCL 5 MG TABS (CYCLOBENZAPRINE HCL) 1 by mouth two times a day x 5 days, then as needed for spasm pain  #30 x 0   Entered and Authorized by:   Newt Lukes MD   Signed by:   Newt Lukes MD on 08/18/2010    Method used:   Electronically to        [facility]  [address] [phone]* (retail)       [address]       [place], [place]  [address]       Ph: [phone] or [phone]       Fax: [phone]   RxID:   [id number] NAPROXEN 500 MG TABS (NAPROXEN) 1 by mouth two times a day x 5 days, then as needed for pain  #30 x 0   Entered and Authorized by:   Newt Lukes MD   Signed by:   Newt Lukes MD on 08/18/2010   Method used:   Electronically to        [facility]  [address] [phone]* (retail)  [address]       [place], [place]  [address]       Ph: [phone] or [phone]       Fax: [phone]   RxID:   [id number]    Orders Added: 1)  Est. Patient Level IV [27253] 2)  Prescription Created Electronically [phone]

## 2010-09-09 ENCOUNTER — Encounter: Payer: Self-pay | Admitting: Cardiovascular Disease

## 2010-09-09 DIAGNOSIS — R609 Edema, unspecified: Secondary | ICD-10-CM | POA: Insufficient documentation

## 2010-09-18 NOTE — Assessment & Plan Note (Signed)
Summary: 2 WK FU  STC   Vital Signs:  Patient profile:   70 year old female Height:      61 inches Weight:      222.25 pounds BMI:     42.15 O2 Sat:      94 % on Room air Temp:     97.7 degrees F oral Pulse rate:   101 / minute BP sitting:   144 / 70  (left arm) Cuff size:   large  Vitals Entered By: Zella Ball Ewing CMA (AAMA) (September 01, 2010 10:29 AM)  O2 Flow:  Room air CC: 2 week followup/Re   Primary Care Provider:  Oliver Barre, MD  CC:  2 week followup/Re.  History of Present Illness: here to f/u; overall doing ok - Pt denies CP, worsening sob, doe, wheezing, orthopnea, pnd, worsening LE edema, palps, dizziness or syncope Pt denies new neuro symptoms such as headache, facial or extremity weakness  Pt denies polydipsia, polyuria, or low sugar symptoms such as shakiness improved with eating.  Overall good compliance with meds, trying to follow low chol, DM diet, wt stable, little excercise however  CBG's improved since dec 2011 , usually in mid 100's. No fever, wt loss, night sweats, loss of appetite or other constitutional symptoms  Overall good compliance with meds, and good tolerability.  Does have ongoing bilat knee pain worse in the past 2 wks, mod to severe, worse to ambulate, assoc with intermittent swelling, but no giveaways or falls, right somewhat worse than left, sitting makes better,  no recent trauma or falls.  Right Shoulder has improved however in that overall pain level is betterm mild now, and only really notices with trying to reach overhead.  No neck pain assoc with this, or radicualr/weak/numb extremity.  Has better ROM gradually with trying to work with it on her own.    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) 5)  Diarrhea  (ICD-787.91) 6)  Chronic Pain Syndrome  (ICD-338.4) 7)  Dyspepsia  (ICD-536.8) 8)  Dyspnea On Exertion   (ICD-786.09) 9)  Fatigue  (ICD-780.79) 10)  Preventive Health Care  (ICD-V70.0) 11)  Myocardial Perfusion Scan, With Stress Test, Abnormal  (ICD-794.39) 12)  Chest Pain  (ICD-786.50) 13)  Nausea  (ICD-787.02) 14)  Shoulder Pain, Bilateral  (ICD-719.41) 15)  Joint Effusion, Left Knee  (ICD-719.06) 16)  Asthma, With Acute Exacerbation  (ICD-493.92) 17)  Abdominal Pain, Epigastric  (ICD-789.06) 18)  Preoperative Visit  (ICD-V72.84) 19)  Screening Colorectal-cancer  (ICD-V76.51) 20)  Preventive Health Care  (ICD-V70.0) 21)  CHF  (ICD-428.0) 22)  Dyspnea  (ICD-786.05) 23)  Anemia-nos  (ICD-285.9) 24)  Low Back Pain  (ICD-724.2) 25)  Allergic Rhinitis  (ICD-477.9) 26)  Nephrolithiasis, Hx of  (ICD-V13.01) 27)  Hypertension  (ICD-401.9) 28)  Family History Diabetes 1st Degree Relative  (ICD-V18.0) 29)  Migraine Headache  (ICD-346.90) 30)  Hyperlipidemia  (ICD-272.4) 31)  Glaucoma Nos  (ICD-365.9) 32)  Asthma  (ICD-493.90) 33)  Diabetic Retinopathy  (ICD-250.50) 34)  Dm, Uncomplicated, Type II, Uncontrolled  (ICD-250.02) 35)  Morbid Obesity, Hx of  (ICD-V13.8)  Medications Prior to Update: 1)  Furosemide 20 Mg  Tabs (Furosemide) .Marland Kitchen.. 1 By Mouth Once Daily 2)  Crestor 20 Mg  Tabs (Rosuvastatin Calcium) .... 1/2 By Mouth Once Daily 3)  Prometrium 100 Mg  Caps (Progesterone Micronized) .... One By Mouth  Once Daily 4)  Menest 0.3 Mg  Tabs (Esterified Estrogens) .... One By Mouth Once Daily 5)  One-A-Day Extras Antioxidant   Caps (Multiple Vitamins-Minerals) .... One By Mouth Once Daily 6)  Lantus For Opticlik 100 Unit/ml  Soln (Insulin Glargine) .... Inject 50 Units Subcutaneously Once Daily 7)  Optive 0.5-0.9 %  Soln (Carboxymethylcellul-Glycerin) .... As Needed As Directed 8)  Novolog Flexpen 100 Unit/ml Soln (Insulin Aspart) .Marland Kitchen.. 15 Units Subcutaneously Once Daily 9)  Bd U/f Short Pen Needle 31g X 8 Mm  Misc (Insulin Pen Needle) .... Use Two Times A Day As Directed---Dx Code 250.02 10)   Freestyle Lite Test  Strp (Glucose Blood) .... Use As Directed 11)  Glucophage Xr 500 Mg Xr24h-Tab (Metformin Hcl) .... 4 By Mouth Qam 12)  Adult Aspirin Ec Low Strength 81 Mg Tbec (Aspirin) .Marland Kitchen.. 1 By Mouth Once Daily 13)  Alprazolam 0.25 Mg Tabs (Alprazolam) .Marland Kitchen.. 1 By Mouth Three Times A Day As Needed 14)  Qvar 80 Mcg/act Aers (Beclomethasone Dipropionate) .Marland Kitchen.. 1 Puff Two Times A Day 15)  Naproxen 500 Mg Tabs (Naproxen) .Marland Kitchen.. 1 By Mouth Two Times A Day X 5 Days, Then As Needed For Pain 16)  Cyclobenzaprine Hcl 5 Mg Tabs (Cyclobenzaprine Hcl) .Marland Kitchen.. 1 By Mouth Two Times A Day X 5 Days, Then As Needed For Spasm Pain  Current Medications (verified): 1)  Furosemide 20 Mg  Tabs (Furosemide) .Marland Kitchen.. 1 By Mouth Once Daily 2)  Crestor 20 Mg  Tabs (Rosuvastatin Calcium) .... 1/2 By Mouth Once Daily 3)  Prometrium 100 Mg  Caps (Progesterone Micronized) .... One By Mouth Once Daily 4)  Menest 0.3 Mg  Tabs (Esterified Estrogens) .... One By Mouth Once Daily 5)  One-A-Day Extras Antioxidant   Caps (Multiple Vitamins-Minerals) .... One By Mouth Once Daily 6)  Lantus For Opticlik 100 Unit/ml  Soln (Insulin Glargine) .... Inject 50 Units Subcutaneously Once Daily 7)  Optive 0.5-0.9 %  Soln (Carboxymethylcellul-Glycerin) .... As Needed As Directed 8)  Novolog Flexpen 100 Unit/ml Soln (Insulin Aspart) .Marland Kitchen.. 15 Units Subcutaneously Once Daily 9)  Bd U/f Short Pen Needle 31g X 8 Mm  Misc (Insulin Pen Needle) .... Use Two Times A Day As Directed---Dx Code 250.02 10)  Freestyle Lite Test  Strp (Glucose Blood) .... Use As Directed 11)  Glucophage Xr 500 Mg Xr24h-Tab (Metformin Hcl) .... 4 By Mouth Qam 12)  Adult Aspirin Ec Low Strength 81 Mg Tbec (Aspirin) .Marland Kitchen.. 1 By Mouth Once Daily 13)  Alprazolam 0.25 Mg Tabs (Alprazolam) .Marland Kitchen.. 1 By Mouth Three Times A Day As Needed 14)  Qvar 80 Mcg/act Aers (Beclomethasone Dipropionate) .Marland Kitchen.. 1 Puff Two Times A Day 15)  Tramadol Hcl 50 Mg Tabs (Tramadol Hcl) .Marland Kitchen.. 1 By Mouth Q 6 Hrs As  Needed 16)  Cyclobenzaprine Hcl 5 Mg Tabs (Cyclobenzaprine Hcl) .Marland Kitchen.. 1 By Mouth Two Times A Day X 5 Days, Then As Needed For Spasm Pain  Allergies (verified): No Known Drug Allergies  Past History:  Past Medical History: Last updated: 08/18/2010 1. Type 2 diabetes, poorly controlled on insulin therapy. (1972)  2. Hypertension 3. Diabetic retinopathy.s/p laser tx bilat 4. Asthma. 5. Glaucoma. 6. Hyperlipidemia 7. Migraine headache. 8. Nephrolithiasis 9. Allergic rhinitis 10.Low back pain/spinal stenosis 11.Anemia-NOS 12Hyperlipidemia 13Bilat carpal tunnell syndrome  Past Surgical History: Last updated: 05/20/2009 Cataract extraction Cholecystectomy Thoracic fusion Lumbar disc surgury Tubal ligation  Social History: Last updated: 07/24/2010 She is married to her second husband for about 24 years. 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 not ill appearing Head:  normocephalic and atraumatic.   Eyes:  vision grossly intact, pupils equal, and pupils round.   Ears:  R ear normal and L ear normal.   Nose:  no external deformity and no nasal discharge.   Mouth:  no gingival abnormalities and pharynx pink and moist.   Neck:  supple and no masses.   Lungs:  normal respiratory effort and normal breath sounds.   Heart:  normal rate and regular rhythm.   Msk:  right shoulder: Full range of motion. full strength with testing rotator cuff. Positive impingement signs. Nontender to palpation. Neurovascularly intact  right knee: decreased range of motion, diffuse  boggy synovitis. Tender to palpation on joint line. Increased pain with weight bearing. Positive crepitus.  Extremities:  no edema, no erythema  Neurologic:  alert & oriented X3 and strength normal in all extremities.     Impression & Recommendations:  Problem # 1:  KNEE PAIN, BILATERAL (ICD-719.46)  Her updated medication list for this problem includes:    Adult Aspirin Ec Low Strength 81 Mg Tbec (Aspirin) .Marland Kitchen... 1 by mouth once daily    Tramadol Hcl 50 Mg Tabs (Tramadol hcl) .Marland Kitchen... 1 by mouth q 6 hrs as needed    Cyclobenzaprine Hcl 5 Mg Tabs (Cyclobenzaprine hcl) .Marland Kitchen... 1 by mouth two times a day x 5 days, then as needed for spasm pain persistent, only minimally imporved, cannot step up to exam table today - will change naproxen to tramadol, refer ortho - suspect DJD flare unremitting (except the swelling has improved by hx) and refer ortho - likely would do better with cortisone;  will defer films to ortho as well  Orders: Orthopedic Surgeon Referral (Ortho Surgeon)  Discussed strengthening exercises, use of ice or heat, and medications.   Problem # 2:  SHOULDER PAIN, RIGHT (ICD-719.41)  Her updated medication list for this problem includes:    Adult Aspirin Ec Low Strength 81 Mg Tbec (Aspirin) .Marland Kitchen... 1 by mouth once daily    Tramadol Hcl 50 Mg Tabs (Tramadol hcl) .Marland Kitchen... 1 by mouth q 6 hrs as needed    Cyclobenzaprine Hcl 5 Mg Tabs (Cyclobenzaprine hcl) .Marland Kitchen... 1 by mouth two times a day x 5 days, then as needed for spasm pain exam improved, - ? mild right bursitis, no need further specific tx at this time more than what she has;  declines PT at this time  Discussed shoulder exercises, use of moist heat or ice, and medication.   Problem # 3:  HYPERTENSION (ICD-401.9)  Her updated medication list for this problem includes:    Furosemide 20 Mg Tabs (Furosemide) .Marland Kitchen... 1 by mouth once daily  BP today: 144/70 Prior BP: 132/72 (08/18/2010)  Labs Reviewed: K+: 4.7 (07/21/2010) Creat: :  1.1 (07/21/2010)   Chol: 124 (07/21/2010)   HDL: 47.30 (07/21/2010)   LDL: 63 (07/21/2010)   TG: 69.0 (07/21/2010) stable overall by hx and exam, ok to continue meds/tx as is   Problem # 4:  DM, UNCOMPLICATED, TYPE II,  UNCONTROLLED (ICD-250.02)  Her updated medication list for this problem includes:    Lantus For Opticlik 100 Unit/ml Soln (Insulin glargine) ..... Inject 50 units subcutaneously once daily    Novolog Flexpen 100 Unit/ml Soln (Insulin aspart) .Marland KitchenMarland KitchenMarland KitchenMarland Kitchen 15 units subcutaneously once daily    Glucophage Xr 500 Mg Xr24h-tab (Metformin hcl) .Marland KitchenMarland KitchenMarland KitchenMarland Kitchen 4 by mouth qam    Adult Aspirin Ec Low Strength 81 Mg Tbec (Aspirin) .Marland Kitchen... 1 by mouth once daily  Labs Reviewed: Creat: 1.1 (07/21/2010)    Reviewed HgBA1c results: 7.8 (07/21/2010)  8.2 (01/21/2010) mild uncontrolled recetnly, but improved CBG's with better diet and med regimen;  Continue all previous medications as before this visit , Pt to cont DM diet, excercise, wt control efforts; to check labs next visit  Complete Medication List: 1)  Furosemide 20 Mg Tabs (Furosemide) .Marland Kitchen.. 1 by mouth once daily 2)  Crestor 20 Mg Tabs (Rosuvastatin calcium) .... 1/2 by mouth once daily 3)  Prometrium 100 Mg Caps (Progesterone micronized) .... One by mouth once daily 4)  Menest 0.3 Mg Tabs (Esterified estrogens) .... One by mouth once daily 5)  One-a-day Extras Antioxidant Caps (Multiple vitamins-minerals) .... One by mouth once daily 6)  Lantus For Opticlik 100 Unit/ml Soln (Insulin glargine) .... Inject 50 units subcutaneously once daily 7)  Optive 0.5-0.9 % Soln (Carboxymethylcellul-glycerin) .... As needed as directed 8)  Novolog Flexpen 100 Unit/ml Soln (Insulin aspart) .Marland Kitchen.. 15 units subcutaneously once daily 9)  Bd U/f Short Pen Needle 31g X 8 Mm Misc (Insulin pen needle) .... Use two times a day as directed---dx code 250.02 10)  Freestyle Lite Test Strp (Glucose blood) .... Use as directed 11)  Glucophage Xr 500 Mg Xr24h-tab (Metformin hcl) ....  4 by mouth qam 12)  Adult Aspirin Ec Low Strength 81 Mg Tbec (Aspirin) .Marland Kitchen.. 1 by mouth once daily 13)  Alprazolam 0.25 Mg Tabs (Alprazolam) .Marland Kitchen.. 1 by mouth three times a day as needed 14)  Qvar 80 Mcg/act Aers (Beclomethasone dipropionate) .Marland Kitchen.. 1 puff two times a day 15)  Tramadol Hcl 50 Mg Tabs (Tramadol hcl) .Marland Kitchen.. 1 by mouth q 6 hrs as needed 16)  Cyclobenzaprine Hcl 5 Mg Tabs (Cyclobenzaprine hcl) .Marland Kitchen.. 1 by mouth two times a day x 5 days, then as needed for spasm pain  Patient Instructions: 1)  stop the naproxen 2)  Please take all new medications as prescribed - the tramadol for pain 3)  Continue all previous medications as before this visit 4)  You will be contacted about the referral(s) to: orthopedic 5)  Please schedule a follow-up appointment in 5 mo with: 6)  BMP prior to visit, ICD-9: 250.02 7)  Lipid Panel prior to visit, ICD-9: 8)  HbgA1C prior to visit, ICD-9: Prescriptions: TRAMADOL HCL 50 MG TABS (TRAMADOL HCL) 1 by mouth q 6 hrs as needed  #60 x 2   Entered and Authorized by:   Corwin Levins MD   Signed by:   Corwin Levins MD on 09/01/2010   Method used:   Print then Give to Patient   RxID:   [id number]    Orders Added: 1)  Orthopedic Surgeon Referral Gaylord Shih Surgeon] 2)  Est. Patient Level IV [56213]

## 2010-09-18 NOTE — Miscellaneous (Signed)
Summary: Orders Update  Clinical Lists Changes  Problems: Added new problem of EDEMA (ICD-782.3) Orders: Added new Test order of Venous Duplex Lower Extremity (Venous Duplex Lower) - Signed 

## 2010-10-28 ENCOUNTER — Other Ambulatory Visit: Payer: Self-pay | Admitting: Obstetrics and Gynecology

## 2010-10-28 ENCOUNTER — Encounter (INDEPENDENT_AMBULATORY_CARE_PROVIDER_SITE_OTHER): Payer: Medicare Other | Admitting: Obstetrics and Gynecology

## 2010-10-28 ENCOUNTER — Other Ambulatory Visit (HOSPITAL_COMMUNITY)
Admission: RE | Admit: 2010-10-28 | Discharge: 2010-10-28 | Disposition: A | Discharge status: Home / Self Care | Payer: Medicare Other | Source: Ambulatory Visit | Attending: Obstetrics and Gynecology | Admitting: Obstetrics and Gynecology

## 2010-10-28 DIAGNOSIS — Z124 Encounter for screening for malignant neoplasm of cervix: Secondary | ICD-10-CM

## 2010-10-28 DIAGNOSIS — N949 Unspecified condition associated with female genital organs and menstrual cycle: Secondary | ICD-10-CM

## 2010-10-28 DIAGNOSIS — N952 Postmenopausal atrophic vaginitis: Secondary | ICD-10-CM

## 2010-10-28 DIAGNOSIS — N951 Menopausal and female climacteric states: Secondary | ICD-10-CM

## 2010-11-04 ENCOUNTER — Other Ambulatory Visit: Payer: Medicare Other

## 2010-11-04 ENCOUNTER — Ambulatory Visit (INDEPENDENT_AMBULATORY_CARE_PROVIDER_SITE_OTHER): Payer: Medicare Other | Admitting: Obstetrics and Gynecology

## 2010-11-04 DIAGNOSIS — M545 Low back pain: Secondary | ICD-10-CM

## 2010-11-04 DIAGNOSIS — R142 Eructation: Secondary | ICD-10-CM

## 2010-11-04 DIAGNOSIS — L909 Atrophic disorder of skin, unspecified: Secondary | ICD-10-CM

## 2010-11-04 DIAGNOSIS — D259 Leiomyoma of uterus, unspecified: Secondary | ICD-10-CM

## 2010-12-16 NOTE — Assessment & Plan Note (Signed)
[facility]                            CARDIOLOGY OFFICE NOTE   Holly Weiss, Holly Weiss                         MRN:          [id number]  DATE:03/05/2008                            DOB:          12-07-1940    PRIMARY CARE PHYSICIAN:  Corwin Levins, MD   REASON FOR PRESENTATION:  Evaluate the patient with dyspnea and chest  pain.   HISTORY OF PRESENT ILLNESS:  The patient is a 70 year old African  American female without prior cardiac history.  She says she has been  short of breath since she had back surgery in 1998.  However, this has  been slowly progressive.  She says she is short of breath walking a  short distance on level ground.  She does not describe PND or orthopnea  typically.  She does have some mild lower extremity swelling, but this  has not been worse recently.  She did have one episode of chest  discomfort at rest.  This was about 2 months ago.  She felt sudden  substernal discomfort.  It was moderate.  It did not radiate.  Went away  spontaneously.  It did travel a little bit under her left breast.  There  were no associated nausea, vomiting, or diaphoresis.  She had no  palpitations, presyncope, or syncope.  She did have an echocardiogram  done recently in the office that demonstrated no significant valvular  abnormalities.  The EF was well preserved.  She did had a chest x-ray  which showed some possible mild vascular congestion with edema.   PAST MEDICAL HISTORY:  Nephrolithiasis, Insulin-dependent diabetes  mellitus times many years, glaucoma, asthma, and hyperlipidemia.   PAST SURGICAL HISTORY:  Cataract surgery, cholecystectomy, tubal  ligation, and lumbar laminectomy.   ALLERGIES:  None.   MEDICATIONS:  Citracal, Lasix 10 mg daily, Crestor 20 mg daily,  Prometrium, multivitamin, Lantus, NovoLog, metformin 750 mg daily,  lisinopril 10 mg daily, and aspirin 81 mg daily.   SOCIAL HISTORY:  The patient is married to her second  husband.  She has  5 children and 4 grandchildren.  She does not smoke cigarettes and does  not drink alcohol.   FAMILY HISTORY:  Noncontributory for early coronary artery disease.   REVIEW OF SYSTEMS:  As stated in the HPI, positive for joint pains  related to arthritis.  Negative for all other systems.   PHYSICAL EXAMINATION:  GENERAL:  The patient is in no distress.  VITAL SIGNS:  Blood pressure 150/76, heart 76 and regular, and weight  218 pounds.  HEENT:  Eyes are unremarkable.  Pupils are equal, round, and reactive to  light.  Fundi not visualized.  Oral mucosa unremarkable, upper dentures.  NECK:  No jugular venous distention at 45 degrees.  Carotid upstroke  brisk and symmetric.  No bruits.  No thyromegaly.  LYMPHATICS:  No  cervical, axillary, or inguinal adenopathy.  LUNGS:  Clear to auscultation bilaterally.  BACK:  No costovertebral tenderness.  CHEST:  Unremarkable.  HEART:  PMI not displaced or sustained, S1 and S2 within normal limits.  No  S3, no S4, no clicks, no rubs, and no murmurs.  ABDOMEN:  Morbidly  obese, positive bowel sounds.  Normal in frequency and pitch.  No  bruits, no rebound, and no guarding.  No midline pulsatile mass.  No  hepatomegaly.  No splenomegaly.  SKIN:  No rashes, no nodules.  EXTREMITIES:  2+ pulses throughout, mild bilateral lower extremity  edema.  NEURO:  Oriented to person, place and time.  Cranial nerves II-XII  grossly intact.  Motor grossly intact throughout.   EKG; sinus rhythm, rate 76, axis within normal limits, intervals within  normal limits, and no acute ST-T wave changes.   ASSESSMENT AND PLAN:  1. Dyspnea.  The patient has longstanding, but slowly progressive      dyspnea.  She also had a 1 episode of chest pain.  She has had a      longstanding diabetes.  It is quite possible that her symptoms are      related to ischemia.  Given this, she will have stress perfusion      study.  I will also check her BMP level.  If both  of these were      normal, then cardiac etiology is unlikely and her dyspnea is      probably related to deconditioning, obesity, and possibly upon her      primary pulmonary process.  2. Hypertension.  Blood pressure is slightly elevated, but she did not      take her medicines this morning.  Instructed on how to keep a blood      pressure diary and she will do this, and we will evaluate it.  We      will treat her blood pressure based on these reports.  3. Diabetes per Dr. Jonny Ruiz.  4. Dyslipidemia.  Given her longstanding diabetes, I would suggest a      goal LDL at least less than 100 and HDL greater than 50, but will      defer to Dr. Jonny Ruiz.  5. Followup will be based on the results of the above testing.     Rollene Rotunda, MD, [facility]  Electronically Signed    JH/MedQ  DD: 03/05/2008  DT: 03/06/2008  Job #: [id number]   cc:   Corwin Levins, MD

## 2010-12-16 NOTE — Assessment & Plan Note (Signed)
[facility]                           PRIMARY CARE OFFICE NOTE   Holly Weiss, Holly Weiss                         MRN:          [id number]  DATE:02/17/2007                            DOB:          [birth year]    HISTORY AND PHYSICAL   CHIEF COMPLAINT:  New patient to the practice.   HISTORY OF PRESENT ILLNESS:  The patient is a 70 year old African  American female here to establish primary care. She was formerly  followed by Dr.  Renato Gails of [facility]. She has type 2 diabetes  since her third pregnancy. Has been poorly controlled on combination of  regular insulin and Humulin N along with metformin once a day. She  reports labile blood sugars with frequent hypoglycemia. Occasionally her  blood sugars are in the low 100s and then spike to 200-300. She does not  recall her hemoglobin A1c. However, she reports Dr.  Renato Gails checked her  blood work 2-3 months ago. She has a history of back surgery. She  reports vertebral fracture and underwent a fusion of her thoracic spine  in 1998. Details are unavailable. Since that time, the patient has  experienced some dyspnea on exertion. She also reports some intermittent  pain in her right arms. She has numbness in the fingertips, but denies  any weakness. She denies any symptoms of chest heaviness. She does not  have any known history of coronary disease.   PAST MEDICAL HISTORY:  1. Type 2 diabetes, poorly controlled on insulin therapy.  2. History of hypertension, not currently on medication.  3. History of diabetic retinopathy.  4. History of asthma.  5. History of glaucoma.  6. Hyperlipidemia.  7. Status post thoracic fusion in 1998.  8. Status post cholecystectomy in 1960.  9. History of migraine headache.  10.Cataract surgery.   CURRENT MEDICATIONS:  1. Humulin R 20 units in the morning and 17 units at p.m.  2. Humulin N 17 units in the morning and 17 units at p.m.  3. Citrucel b.i.d.  4. Furosemide  20 mg one-half once a day.  5. Crestor 20 mg one-half once a day.  6. Metformin 500 mg b.i.d.  7. Prometrium 100 mg once a day.  8. Menest 0.3 mg once a day.  9. Multivitamin one a day.  10.Aspirin one a day.   ALLERGIES TO MEDICATIONS:  None known.   SOCIAL HISTORY:  She is married to her second husband for about 24  years. She has five grown children from her first marriage and she is a  retired Actor.   FAMILY HISTORY:  Mother deceased at age 53. She had diabetes. Father at  age 64 secondary to lung cancer. Multiple siblings with Type 2 diabetes.   HABITS:  No alcohol. No tobacco.   REVIEW OF SYSTEMS:  As noted above. All other systems negative.   PHYSICAL EXAMINATION:  VITAL SIGNS: Height 5 feet, 1 inch. Weight is 221  pounds. Temperature 97.0, pulse 59, blood pressure 146/70 in the left  arm in the seated position.  GENERAL: The patient is a  pleasant, overweight 89 -year-old [place]  American female in no apparent distress.  HEENT: Normocephalic, atraumatic. Pupils are equal and symmetric. She  has evidence of bilateral previous iridectomies. External auditory  canals and tympanic membranes are clear bilaterally. Oropharyngeal  examination revealed upper dental plate, otherwise unremarkable.  NECK: Was somewhat thick. Could not appreciate any carotid bruits,  thyromegaly.  CHEST: Normal respiratory effort. Decreased thoracic kyphosis from  previous. Midline thoracic scar. Clear to auscultation bilaterally.  CARDIOVASCULAR: Regular rate and rhythm. No significant murmurs, rubs or  gallops appreciated.  ABDOMEN: Protuberant. There was mild epigastric discomfort.  MUSCULOSKELETAL: No clubbing, cyanosis or edema. Had palpable pedis and  dorsalis pulses. Had diminished vibration sense in her left foot. Muscle  strength was 4/5 throughout.   IMPRESSIONS/RECOMMENDATIONS:  1. Type 2 diabetes with labile blood sugars and symptomatic      hypoglycemia.  2.  Hypertension, uncontrolled.  3. Hyperlipidemia.  4. History of diabetic retinopathy.  5. Right arm pain with history of back surgery.  6. Health maintenance.   RECOMMENDATIONS:  1. We will discontinue her Humulin N and R. She was started on Lantus      30 units once a day and NovoLog 10-15 units AC b.i.d.  2. We will change her metformin to extended release 500 mg b.i.d.  3. Before antihypertensives, I would like to obtain baseline labs      including an hemoglobin A1c,      BMET and LFTs.  4. We will arrange for diabetic teaching with Cristy Folks.  5. Followup in 2-3 weeks.     Barbette Hair. Artist Pais, DO  Electronically Signed    RDY/MedQ  DD: 02/17/2007  DT: 02/17/2007  Job #: [id number]

## 2011-01-20 ENCOUNTER — Other Ambulatory Visit: Payer: Self-pay

## 2011-01-20 ENCOUNTER — Other Ambulatory Visit: Payer: Self-pay | Admitting: Internal Medicine

## 2011-01-20 DIAGNOSIS — IMO0001 Reserved for inherently not codable concepts without codable children: Secondary | ICD-10-CM

## 2011-01-23 ENCOUNTER — Other Ambulatory Visit (INDEPENDENT_AMBULATORY_CARE_PROVIDER_SITE_OTHER): Payer: Medicare Other

## 2011-01-23 LAB — LIPID PANEL
HDL: 45.9 mg/dL (ref >39.00)
LDL Cholesterol: 71 mg/dL (ref 0–99)
Total CHOL/HDL Ratio: 3
Triglycerides: 79 mg/dL (ref 0.0–149.0)

## 2011-01-23 LAB — BASIC METABOLIC PANEL
CO2: 29 mEq/L (ref 19–32)
Chloride: 107 mEq/L (ref 96–112)
Creatinine, Ser: 1 mg/dL (ref 0.4–1.2)

## 2011-01-27 ENCOUNTER — Encounter: Payer: Self-pay | Admitting: Internal Medicine

## 2011-01-27 ENCOUNTER — Ambulatory Visit (INDEPENDENT_AMBULATORY_CARE_PROVIDER_SITE_OTHER): Payer: Medicare Other | Admitting: Internal Medicine

## 2011-01-27 ENCOUNTER — Ambulatory Visit: Payer: Self-pay | Admitting: Internal Medicine

## 2011-01-27 VITALS — BP 144/62 | HR 85 | Temp 98.7°F | Ht 60.0 in | Wt 210.0 lb

## 2011-01-27 DIAGNOSIS — Z0001 Encounter for general adult medical examination with abnormal findings: Secondary | ICD-10-CM | POA: Insufficient documentation

## 2011-01-27 DIAGNOSIS — E785 Hyperlipidemia, unspecified: Secondary | ICD-10-CM

## 2011-01-27 DIAGNOSIS — J45909 Unspecified asthma, uncomplicated: Secondary | ICD-10-CM | POA: Insufficient documentation

## 2011-01-27 DIAGNOSIS — I10 Essential (primary) hypertension: Secondary | ICD-10-CM

## 2011-01-27 DIAGNOSIS — Z0000 Encounter for general adult medical examination without abnormal findings: Secondary | ICD-10-CM

## 2011-01-27 DIAGNOSIS — I509 Heart failure, unspecified: Secondary | ICD-10-CM

## 2011-01-27 MED ORDER — INSULIN GLARGINE 100 UNIT/ML ~~LOC~~ SOLN: 40.0000 [IU] | Freq: Every day | SUBCUTANEOUS | Status: DC

## 2011-01-27 MED ORDER — FUROSEMIDE 40 MG PO TABS: 40.0000 mg | ORAL_TABLET | Freq: Every day | ORAL | Status: DC

## 2011-01-27 NOTE — Assessment & Plan Note (Signed)
Lab Results  Component Value Date   LDLCALC 71 01/23/2011   stable overall by hx and exam, most recent data reviewed with pt, and pt to continue medical treatment as before

## 2011-01-27 NOTE — Patient Instructions (Addendum)
Please increase the lantus from the 35 units you actually take, to the 40 units per day Increase the furosemide to 40 mg per day You will be contacted regarding the referral for: Cardiology - Dr Antoine Poche Continue all other medications as before Please return in 6 mo with Lab testing done 3-5 days before

## 2011-01-27 NOTE — Assessment & Plan Note (Signed)
stable overall by hx and exam, most recent data reviewed with pt, and pt to continue medical treatment as before  BP Readings from Last 3 Encounters:  01/27/11 144/62  09/01/10 144/70  08/18/10 132/72

## 2011-01-27 NOTE — Assessment & Plan Note (Signed)
Mild uncompensated; to increase the lasix to 40 mg; refer pt back to Dr Hochrein/card, last seen with echo/stress test nov 2010

## 2011-01-27 NOTE — Progress Notes (Signed)
Subjective:    Patient ID: Holly Weiss, female    DOB: 1941-04-22, 70 y.o.   MRN: [id number]  HPI  Here to f/u; overall doing ok,  Pt denies chest pain, increased sob or doe, wheezing, orthopnea, PND, increased LE swelling, palpitations, dizziness or syncope.  Pt denies new neurological symptoms such as new headache, or facial or extremity weakness or numbness   Pt denies polydipsia, polyuria, or low sugar symptoms such as weakness or confusion improved with po intake.  Pt states overall good compliance with meds, trying to follow lower cholesterol, diabetic diet, wt overall stable but little exercise however, worse recently due to recurring low back pain.  Pt continues to have recurring LBP without change in severity, bowel or bladder change, fever, wt loss,  worsening LE pain/numbness/weakness, gait change or falls.  Has seen Dr Fulton Reek, diagnosis unclear, has yet to f/u.  Does not realize today she has lost 12 lbs since last visit.  Has had mild worsening reflux better with tums,  But no  dysphagia, abd pain, n/v, bowel change or blood.  Has other pain to left side of the neck and right elbow that also keeps he from being as active but plans to be more active. Wants to try to open a business in her home, but not sure yet. Past Medical History  Diagnosis Date  . Abdominal pain, epigastric 08/20/2008  . ALLERGIC RHINITIS 01/18/2008  . ANEMIA-NOS 01/18/2008  . 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 ASTHMA, WITH ACUTE EXACERBATION 11/29/2008  . ASTHMA 05/30/2007  . CHEST PAIN 04/09/2009  . CHF 01/18/2008  . Chronic pain syndrome 01/21/2010  . DIABETIC  RETINOPATHY 05/30/2007  . Diarrhea 07/24/2010  . DM, UNCOMPLICATED, TYPE II, UNCONTROLLED 05/30/2007  . DYSPEPSIA 01/21/2010  . DYSPNEA ON EXERTION 01/21/2010  . DYSPNEA 01/18/2008  . Edema 09/09/2010  . FATIGUE 01/21/2010  . 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 HYPERLIPIDEMIA 05/30/2007  . HYPERTENSION 10/31/2007  . JOINT EFFUSION, LEFT KNEE 11/29/2008  . KNEE  PAIN, BILATERAL 09/01/2010  . LOW BACK PAIN 01/18/2008  . MORBID OBESITY, HX OF 05/30/2007  . MYOCARDIAL PERFUSION SCAN, WITH STRESS TEST, ABNORMAL 05/01/2009  . NAUSEA 04/09/2009  . NEPHROLITHIASIS, HX OF 01/18/2008  . SHOULDER PAIN, BILATERAL 04/09/2009  . Asthma 01/27/2011   Past Surgical History  Procedure Date  . Cataract extraction   . Cholecystectomy   . Thoracic fusion   . Lumbar disc surgury   . Tubal ligation     reports that she has never smoked. She does not have any smokeless tobacco history on file. She reports that she does not drink alcohol or use illicit drugs. family history is not on file. No Known Allergies Current Outpatient Prescriptions on File Prior to Visit  Medication Sig Dispense Refill  . aspirin 81 MG tablet Take 81 mg by mouth daily.        . beclomethasone (QVAR) 80 MCG/ACT inhaler Inhale 1 puff into the lungs 2 (two) times daily.        . Carboxymethylcellul-Glycerin (OPTIVE) 0.5-0.9 % SOLN Apply to eye. As needed as directed       . cyclobenzaprine (FLEXERIL) 5 MG tablet 1 by mouth two times a day for 5 days, then as needed for spasm pain.       Marland Kitchen Esterified Estrogens (MENEST) 0.3 MG tablet Take 0.3 mg by mouth daily.        . furosemide (LASIX) 20 MG tablet Take 20 mg by mouth daily.        Marland Kitchen  glucose blood (FREESTYLE LITE) test strip Use as directed       . insulin aspart (NOVOLOG FLEXPEN) 100 UNIT/ML injection Inject 15 Units into the skin daily.        . insulin glargine (LANTUS OPTICLIK) 100 UNIT/ML injection Inject 50 Units into the skin daily.        . Insulin Pen Needle (B-D ULTRAFINE III SHORT PEN) 31G X 8 MM MISC Use two times a day as directed, 250.02       . metFORMIN (GLUCOPHAGE-XR) 500 MG 24 hr tablet 4 by mouth every morning       . Multiple Vitamins-Minerals (ONE-A-DAY EXTRAS ANTIOXIDANT PO) Take by mouth daily.        . progesterone (PROMETRIUM) 100 MG capsule Take 100 mg by mouth daily.        . rosuvastatin (CRESTOR) 20 MG tablet 1/2 by  mouth once daily       . ALPRAZolam (XANAX) 0.25 MG tablet Take 0.25 mg by mouth 3 (three) times daily as needed.        . traMADol (ULTRAM) 50 MG tablet Take 50 mg by mouth every 6 (six) hours as needed.         Review of Systems Review of Systems  Constitutional: Negative for diaphoresis and unexpected weormal.  Abd:  Soft, NT, non-distended, + BS ight change.  HENT: Negative for drooling and tinnitus.   Eyes: Negative for photophobia and visual disturbance.  Respiratory: Negative for choking and stridor.   Gastrointestinal: Negative for vomiting and blood in stool.  Genitourinary: Negative for hematuria and decreased urine volume.     Objective:   Physical Exam BP 144/62  Pulse 85  Temp(Src) 98.7 F (37.1 C) (Oral)  Ht 5' (1.524 m)  Wt 210 lb (95.255 kg)  BMI 41.01 kg/m2  SpO2 98% Physical Exam  VS noted Constitutional: Pt appears well-developed and well-nourished.  HENT: Head: Normocephalic.  Right Ear: External ear normal.  Left Ear: External ear normal.  No rales Eyes: Conjunctivae and EOM are normal. Pupils are equal, round, and reactive to light.  Neck: Normal range of motion. Neck supple.  Cardiovascular: Normal rate and regular rhythm.   Pulmonary/Chest: Effort normal and breath sounds mild decreased, few rales Neurological: Pt is alert. No cranial nerve deficit.  Skin: Skin is warm. No erythema. 1+ edema bilat mid calves Psychiatric: Pt behavior is normal. Thought content normal.         Assessment & Plan:

## 2011-01-27 NOTE — Assessment & Plan Note (Addendum)
Mild uncontrolled, only taking 35 units lantus as 50 seems too much with freq low sugars;  For now incr the lantus to 40 units  Lab Results  Component Value Date   HGBA1C 8.1* 01/23/2011

## 2011-03-02 ENCOUNTER — Encounter: Payer: Self-pay | Admitting: Cardiology

## 2011-03-11 ENCOUNTER — Other Ambulatory Visit: Payer: Self-pay

## 2011-03-11 MED ORDER — BECLOMETHASONE DIPROPIONATE 80 MCG/ACT IN AERS: 1.0000 | INHALATION_SPRAY | Freq: Two times a day (BID) | RESPIRATORY_TRACT | Status: DC

## 2011-03-31 ENCOUNTER — Ambulatory Visit: Payer: Medicare Other | Admitting: Cardiology

## 2011-04-17 ENCOUNTER — Ambulatory Visit: Payer: Medicare Other | Admitting: Cardiology

## 2011-05-07 ENCOUNTER — Encounter: Payer: Self-pay | Admitting: Cardiology

## 2011-06-11 ENCOUNTER — Ambulatory Visit (INDEPENDENT_AMBULATORY_CARE_PROVIDER_SITE_OTHER): Payer: Medicare Other | Admitting: Cardiology

## 2011-06-11 ENCOUNTER — Other Ambulatory Visit: Payer: Self-pay

## 2011-06-11 DIAGNOSIS — R0989 Other specified symptoms and signs involving the circulatory and respiratory systems: Secondary | ICD-10-CM

## 2011-06-11 MED ORDER — METFORMIN HCL ER 500 MG PO TB24: 500.0000 mg | ORAL_TABLET | Freq: Every day | ORAL | Status: DC

## 2011-06-12 NOTE — Progress Notes (Signed)
Patient no showed for the appt.

## 2011-07-21 ENCOUNTER — Other Ambulatory Visit (INDEPENDENT_AMBULATORY_CARE_PROVIDER_SITE_OTHER): Payer: Medicare Other

## 2011-07-21 ENCOUNTER — Ambulatory Visit (INDEPENDENT_AMBULATORY_CARE_PROVIDER_SITE_OTHER): Payer: Medicare Other | Admitting: Internal Medicine

## 2011-07-21 ENCOUNTER — Encounter: Payer: Self-pay | Admitting: Internal Medicine

## 2011-07-21 ENCOUNTER — Other Ambulatory Visit: Payer: Self-pay | Admitting: Internal Medicine

## 2011-07-21 VITALS — BP 122/62 | HR 72 | Temp 98.5°F | Ht 61.0 in | Wt 220.0 lb

## 2011-07-21 DIAGNOSIS — IMO0001 Reserved for inherently not codable concepts without codable children: Secondary | ICD-10-CM

## 2011-07-21 DIAGNOSIS — M542 Cervicalgia: Secondary | ICD-10-CM

## 2011-07-21 DIAGNOSIS — I10 Essential (primary) hypertension: Secondary | ICD-10-CM

## 2011-07-21 DIAGNOSIS — Z23 Encounter for immunization: Secondary | ICD-10-CM

## 2011-07-21 DIAGNOSIS — Z0000 Encounter for general adult medical examination without abnormal findings: Secondary | ICD-10-CM

## 2011-07-21 DIAGNOSIS — M25569 Pain in unspecified knee: Secondary | ICD-10-CM

## 2011-07-21 DIAGNOSIS — M25561 Pain in right knee: Secondary | ICD-10-CM

## 2011-07-21 LAB — CBC WITH DIFFERENTIAL/PLATELET
Basophils Relative: 0.5 % (ref 0.0–3.0)
Eosinophils Relative: 4.3 % (ref 0.0–5.0)
Hemoglobin: 10.7 g/dL — ABNORMAL LOW (ref 12.0–15.0)
Lymphocytes Relative: 28.4 % (ref 12.0–46.0)
MCHC: 33.7 g/dL (ref 30.0–36.0)
Neutro Abs: 3.9 10*3/uL (ref 1.4–7.7)
Neutrophils Relative %: 58 % (ref 43.0–77.0)
RBC: 3.49 Mil/uL — ABNORMAL LOW (ref 3.87–5.11)
WBC: 6.7 10*3/uL (ref 4.5–10.5)

## 2011-07-21 LAB — HEPATIC FUNCTION PANEL
ALT: 23 U/L (ref 0–35)
Bilirubin, Direct: 0.1 mg/dL (ref 0.0–0.3)
Total Bilirubin: 0.6 mg/dL (ref 0.3–1.2)
Total Protein: 7.4 g/dL (ref 6.0–8.3)

## 2011-07-21 LAB — BASIC METABOLIC PANEL
BUN: 32 mg/dL — ABNORMAL HIGH (ref 6–23)
CO2: 27 mEq/L (ref 19–32)
Calcium: 9.8 mg/dL (ref 8.4–10.5)
Chloride: 107 mEq/L (ref 96–112)
Creatinine, Ser: 1.1 mg/dL (ref 0.4–1.2)

## 2011-07-21 LAB — URINALYSIS, ROUTINE W REFLEX MICROSCOPIC
Hgb urine dipstick: NEGATIVE
Nitrite: NEGATIVE
Specific Gravity, Urine: >=1.03 (ref 1.000–1.030)
Total Protein, Urine: NEGATIVE
Urine Glucose: NEGATIVE
Urobilinogen, UA: 0.2 (ref 0.0–1.0)

## 2011-07-21 LAB — LIPID PANEL
Cholesterol: 139 mg/dL (ref 0–200)
HDL: 52.1 mg/dL (ref >39.00)
LDL Cholesterol: 72 mg/dL (ref 0–99)
Total CHOL/HDL Ratio: 3
Triglycerides: 75 mg/dL (ref 0.0–149.0)

## 2011-07-21 LAB — MICROALBUMIN / CREATININE URINE RATIO: Microalb Creat Ratio: 4.8 mg/g (ref 0.0–30.0)

## 2011-07-21 MED ORDER — TRAMADOL HCL 50 MG PO TABS: 50.0000 mg | ORAL_TABLET | Freq: Four times a day (QID) | ORAL | Status: AC | PRN

## 2011-07-21 NOTE — Assessment & Plan Note (Signed)
Overall doing well, age appropriate education and counseling updated, referrals for preventative services and immunizations addressed, dietary and smoking counseling addressed, most recent labs and ECG reviewed.  I have personally reviewed and have noted: 1) the patient's medical and social history 2) The pt's use of alcohol, tobacco, and illicit drugs 3) The patient's current medications and supplements 4) Functional ability including ADL's, fall risk, home safety risk, hearing and visual impairment 5) Diet and physical activities 6) Evidence for depression or mood disorder 7) The patient's height, weight, and BMI have been recorded in the chart I have made referrals, and provided counseling and education based on review of the above Declines colonoscopy, for flu shot today

## 2011-07-21 NOTE — Patient Instructions (Signed)
Take all new medications as prescribed Continue all other medications as before You will be contacted regarding the referral for: orthopedic for the knees Please go to LAB in the Basement for the blood and/or urine tests to be done today Please call the phone number [phone] (the [employer] System) for results of testing in 2-3 days;  When calling, simply dial the number, and when prompted enter the MRN number above (the Medical Record Number) and the # key, then the message should start. Please return in 6 mo with Lab testing done 3-5 days before

## 2011-07-22 ENCOUNTER — Other Ambulatory Visit: Payer: Self-pay | Admitting: Internal Medicine

## 2011-07-22 MED ORDER — INSULIN GLARGINE 100 UNIT/ML ~~LOC~~ SOLN: 50.0000 [IU] | Freq: Every day | SUBCUTANEOUS | Status: DC

## 2011-07-26 ENCOUNTER — Encounter: Payer: Self-pay | Admitting: Internal Medicine

## 2011-07-26 NOTE — Assessment & Plan Note (Signed)
stable overall by hx and exam, most recent data reviewed with pt, and pt to continue medical treatment as before  Lab Results  Component Value Date   HGBA1C 8.0* 07/21/2011

## 2011-07-26 NOTE — Progress Notes (Signed)
Subjective:    Patient ID: Holly Weiss, female    DOB: May 13, 1941, 70 y.o.   MRN: [id number]  HPI  Here for wellness and f/u;  Overall doing ok;  Pt denies CP, worsening SOB, DOE, wheezing, orthopnea, PND, worsening LE edema, palpitations, dizziness or syncope.  Pt denies neurological change such as new Headache, facial or extremity weakness.  Pt denies polydipsia, polyuria, or low sugar symptoms. Pt states overall good compliance with treatment and medications, good tolerability, and trying to follow lower cholesterol diet.  Pt denies worsening depressive symptoms, suicidal ideation or panic. No fever, wt loss, night sweats, loss of appetite, or other constitutional symptoms.  Pt states good ability with ADL's, low fall risk, home safety reviewed and adequate, no significant changes in hearing or vision, and occasionally active with exercise.  Has had post lower neck pain flare in the past wk without radicular symtpoms, but is mod, persitent, worse to turn head horizontal such that she has not been able to drive due to decreased ROM and not being able to see the blind spots.  Also with ongoing bilat knee pain, left > right with recudring mild swelling but also sesne of giveaway but no falls for 2 wks.  Denies worsening depressive symptoms, suicidal ideation, or panic. Past Medical History  Diagnosis Date  . Abdominal pain, epigastric 08/20/2008  . ALLERGIC RHINITIS 01/18/2008  . ANEMIA-NOS 01/18/2008  . 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 ASTHMA, WITH ACUTE EXACERBATION 11/29/2008  . ASTHMA 05/30/2007  . CHEST PAIN 04/09/2009  . CHF 01/18/2008  . Chronic pain syndrome 01/21/2010  . DIABETIC  RETINOPATHY 05/30/2007  . Diarrhea 07/24/2010  . DM, UNCOMPLICATED, TYPE II, UNCONTROLLED 05/30/2007  . DYSPEPSIA 01/21/2010  . DYSPNEA ON EXERTION 01/21/2010  . DYSPNEA 01/18/2008  . Edema 09/09/2010  . FATIGUE 01/21/2010  . 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 HYPERLIPIDEMIA 05/30/2007  . HYPERTENSION 10/31/2007  . JOINT  EFFUSION, LEFT KNEE 11/29/2008  . KNEE PAIN, BILATERAL 09/01/2010  . LOW BACK PAIN 01/18/2008  . MORBID OBESITY, HX OF 05/30/2007  . MYOCARDIAL PERFUSION SCAN, WITH STRESS TEST, ABNORMAL 05/01/2009  . NAUSEA 04/09/2009  . NEPHROLITHIASIS, HX OF 01/18/2008  . SHOULDER PAIN, BILATERAL 04/09/2009  . Asthma 01/27/2011  . Migraine headache   . Spinal stenosis   . Bilateral carpal tunnel syndrome   . History of colonoscopy    Past Surgical History  Procedure Date  . Cataract extraction   . Cholecystectomy   . Thoracic fusion   . Lumbar disc surgury   . Tubal ligation     reports that she has never smoked. She does not have any smokeless tobacco history on file. She reports that she does not drink alcohol or use illicit drugs. family history includes Diabetes in her mother and other and Lung cancer in her father. No Known Allergies Current Outpatient Prescriptions on File Prior to Visit  Medication Sig Dispense Refill  . aspirin 81 MG tablet Take 81 mg by mouth daily.        . beclomethasone (QVAR) 80 MCG/ACT inhaler Inhale 1 puff into the lungs 2 (two) times daily.  1 Inhaler  6  . Carboxymethylcellul-Glycerin (OPTIVE) 0.5-0.9 % SOLN Apply to eye. As needed as directed       . cyclobenzaprine (FLEXERIL) 5 MG tablet 1 by mouth two times a day for 5 days, then as needed for spasm pain.       . Esterified Estrogens (MENEST) 0.3 MG tablet Take 0.3 mg by mouth  daily.        . furosemide (LASIX) 40 MG tablet Take 1 tablet (40 mg total) by mouth daily.  30 tablet  11  . glucose blood (FREESTYLE LITE) test strip Use as directed       . insulin aspart (NOVOLOG FLEXPEN) 100 UNIT/ML injection Inject 15 Units into the skin daily.        . Insulin Pen Needle (B-D ULTRAFINE III SHORT PEN) 31G X 8 MM MISC Use two times a day as directed, 250.02       . metFORMIN (GLUCOPHAGE-XR) 500 MG 24 hr tablet Take 1 tablet (500 mg total) by mouth daily with breakfast. 4 by mouth every morning  120 tablet  9  . Multiple  Vitamins-Minerals (ONE-A-DAY EXTRAS ANTIOXIDANT PO) Take by mouth daily.        . progesterone (PROMETRIUM) 100 MG capsule Take 100 mg by mouth daily.        . rosuvastatin (CRESTOR) 20 MG tablet 1/2 by mouth once daily       . traMADol (ULTRAM) 50 MG tablet Take 50 mg by mouth every 6 (six) hours as needed.        . ALPRAZolam (XANAX) 0.25 MG tablet Take 0.25 mg by mouth 3 (three) times daily as needed.         Review of Systems Review of Systems  Constitutional: Negative for diaphoresis, activity change, appetite change and unexpected weight change.  HENT: Negative for hearing loss, ear pain, facial swelling, mouth sores and neck stiffness.   Eyes: Negative for pain, redness and visual disturbance.  Respiratory: Negative for shortness of breath and wheezing.   Cardiovascular: Negative for chest pain and palpitations.  Gastrointestinal: Negative for diarrhea, blood in stool, abdominal distention and rectal pain.  Genitourinary: Negative for hematuria, flank pain and decreased urine volume.  Musculoskeletal: Negative for myalgias and joint swelling.  Skin: Negative for color change and wound.  Neurological: Negative for syncope and numbness.  Hematological: Negative for adenopathy.  Psychiatric/Behavioral: Negative for hallucinations, self-injury, decreased concentration and agitation.      Objective:   Physical Exam BP 122/62  Pulse 72  Temp(Src) 98.5 F (36.9 C) (Oral)  Ht 5\' 1"  (1.549 m)  Wt 220 lb (99.791 kg)  BMI 41.57 kg/m2  SpO2 98% Physical Exam  VS noted Constitutional: Pt is oriented to person, place, and time. Appears well-developed and well-nourished.  HENT:  Head: Normocephalic and atraumatic.  Right Ear: External ear normal.  Left Ear: External ear normal.  Nose: Nose normal.  Mouth/Throat: Oropharynx is clear and moist.  Eyes: Conjunctivae and EOM are normal. Pupils are equal, round, and reactive to light.  Neck: Normal range of motion. Neck supple. No JVD  present. No tracheal deviation present.  Cardiovascular: Normal rate, regular rhythm, normal heart sounds and intact distal pulses.   Pulmonary/Chest: Effort normal and breath sounds normal.  Abdominal: Soft. Bowel sounds are normal. There is no tenderness.  Musculoskeletal: Normal range of motion. Exhibits no edema.  Lymphadenopathy:  Has no cervical adenopathy.  Neurological: Pt is alert and oriented to person, place, and time. Pt has normal reflexes. No cranial nerve deficit. Motor/dtr intact, gait intact Skin: Skin is warm and dry. No rash noted.  Psychiatric:  Has  normal mood and affect. Behavior is normal.  Neck /spine nontender but decreased ROM horizontal, left knee with crepitus and small effusion    Assessment & Plan:

## 2011-07-26 NOTE — Assessment & Plan Note (Signed)
stable overall by hx and exam, most recent data reviewed with pt, and pt to continue medical treatment as before  BP Readings from Last 3 Encounters:  07/21/11 122/62  01/27/11 144/62  09/01/10 144/70

## 2011-08-14 ENCOUNTER — Ambulatory Visit: Payer: Medicare Other | Admitting: Cardiology

## 2011-09-02 ENCOUNTER — Ambulatory Visit (INDEPENDENT_AMBULATORY_CARE_PROVIDER_SITE_OTHER): Payer: Medicare Other | Admitting: Cardiology

## 2011-09-02 ENCOUNTER — Encounter: Payer: Self-pay | Admitting: Cardiology

## 2011-09-02 VITALS — BP 170/90 | HR 75 | Ht 61.0 in | Wt 223.0 lb

## 2011-09-02 DIAGNOSIS — R0989 Other specified symptoms and signs involving the circulatory and respiratory systems: Secondary | ICD-10-CM

## 2011-09-02 DIAGNOSIS — I10 Essential (primary) hypertension: Secondary | ICD-10-CM

## 2011-09-02 DIAGNOSIS — R0600 Dyspnea, unspecified: Secondary | ICD-10-CM

## 2011-09-02 DIAGNOSIS — I509 Heart failure, unspecified: Secondary | ICD-10-CM

## 2011-09-02 DIAGNOSIS — R0609 Other forms of dyspnea: Secondary | ICD-10-CM

## 2011-09-02 DIAGNOSIS — E785 Hyperlipidemia, unspecified: Secondary | ICD-10-CM

## 2011-09-02 NOTE — Assessment & Plan Note (Signed)
The blood pressure is at target. No change in medications is indicated. We will continue with therapeutic lifestyle changes (TLC).  

## 2011-09-02 NOTE — Progress Notes (Signed)
HPI The patient presents for evaluation of dyspnea and multiple cardiovascular risk factors. I last saw her a couple of years ago. She had an abnormal stress perfusion study suggesting some possible ischemia in the inferior wall with a mildly reduced ejection fraction. Prior to that an echo had been normal. I suggested cardiac catheterization. She went home to talk about this and never called back to schedule that study. She said she decided not to have it. She's been managed for her cardiovascular risk factors. Recently she's had some increased dyspnea. She says this has been over 3 or 4 weeks. She's had some joint pains with probable arthritis. She tries to walk and has noticed more dyspnea walking on level ground. She's not describing PND or orthopnea. She's not describing chest pressure, arm discomfort. She's had some "swelling" of her neck. She's not describing new cough fevers or chills.  No Known Allergies  Current Outpatient Prescriptions  Medication Sig Dispense Refill  . ALPRAZolam (XANAX) 0.25 MG tablet Take 0.25 mg by mouth 3 (three) times daily as needed.        Marland Kitchen aspirin 81 MG tablet Take 81 mg by mouth daily.        . beclomethasone (QVAR) 80 MCG/ACT inhaler Inhale 1 puff into the lungs 2 (two) times daily.  1 Inhaler  6  . cyclobenzaprine (FLEXERIL) 5 MG tablet 1 by mouth two times a day for 5 days, then as needed for spasm pain.       Marland Kitchen Esterified Estrogens (MENEST) 0.3 MG tablet Take 0.3 mg by mouth daily.        . furosemide (LASIX) 40 MG tablet Take 1 tablet (40 mg total) by mouth daily.  30 tablet  11  . glucosamine-chondroitin 500-400 MG tablet Take 1 tablet by mouth daily.      Marland Kitchen glucose blood (FREESTYLE LITE) test strip Use as directed       . insulin aspart (NOVOLOG FLEXPEN) 100 UNIT/ML injection Inject 15 Units into the skin daily.        . insulin glargine (LANTUS OPTICLIK) 100 UNIT/ML injection Inject 50 Units into the skin daily.  10 mL  3  . Insulin Pen Needle (B-D  ULTRAFINE III SHORT PEN) 31G X 8 MM MISC Use two times a day as directed, 250.02       . metFORMIN (GLUCOPHAGE-XR) 500 MG 24 hr tablet Take 1 tablet (500 mg total) by mouth daily with breakfast. 4 by mouth every morning  120 tablet  9  . Multiple Vitamins-Minerals (ONE-A-DAY EXTRAS ANTIOXIDANT PO) Take by mouth daily.        . NON FORMULARY Take by mouth daily. Circulation and view support for healthy legs      . progesterone (PROMETRIUM) 100 MG capsule Take 100 mg by mouth daily.        . rosuvastatin (CRESTOR) 20 MG tablet 1/2 by mouth once daily         Past Medical History  Diagnosis Date  . Abdominal pain, epigastric 08/20/2008  . ALLERGIC RHINITIS 01/18/2008  . ANEMIA-NOS 01/18/2008  . 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 ASTHMA, WITH ACUTE EXACERBATION 11/29/2008  . ASTHMA 05/30/2007  . CHEST PAIN 04/09/2009  . CHF 01/18/2008  . Chronic pain syndrome 01/21/2010  . DIABETIC  RETINOPATHY 05/30/2007  . Diarrhea 07/24/2010  . DM, UNCOMPLICATED, TYPE II, UNCONTROLLED 05/30/2007  . DYSPEPSIA 01/21/2010  . DYSPNEA ON EXERTION 01/21/2010  . DYSPNEA 01/18/2008  . Edema 09/09/2010  . FATIGUE 01/21/2010  .  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 HYPERLIPIDEMIA 05/30/2007  . HYPERTENSION 10/31/2007  . JOINT EFFUSION, LEFT KNEE 11/29/2008  . KNEE PAIN, BILATERAL 09/12/2010  . LOW BACK PAIN 01/18/2008  . MORBID OBESITY, HX OF 05/30/2007  . MYOCARDIAL PERFUSION SCAN, WITH STRESS TEST, ABNORMAL 05/01/2009  . NAUSEA 04/09/2009  . NEPHROLITHIASIS, HX OF 01/18/2008  . SHOULDER PAIN, BILATERAL 04/09/2009  . Asthma 01/27/2011  . Migraine headache   . Spinal stenosis   . Bilateral carpal tunnel syndrome   . History of colonoscopy     Past Surgical History  Procedure Date  . Cataract extraction   . Cholecystectomy   . Thoracic fusion   . Lumbar disc surgury   . Tubal ligation     ROS:  As stated in the HPI and negative for all other systems.  PHYSICAL EXAM BP 170/90  Pulse 75  Ht 5\' 1"  (1.549 m)  Wt 223 lb  (101.152 kg)  BMI 42.14 kg/m2 GENERAL:  Well appearing HEENT:  Pupils equal round and reactive, fundi not visualized, oral mucosa unremarkable NECK:  No jugular venous distention, waveform within normal limits, carotid upstroke brisk and symmetric, no bruits, no thyromegaly LYMPHATICS:  No cervical, inguinal adenopathy LUNGS:  Clear to auscultation bilaterally BACK:  No CVA tenderness CHEST:  Unremarkable HEART:  PMI not displaced or sustained,S1 and S2 within normal limits, no S3, no S4, no clicks, no rubs, no murmurs ABD:  Flat, positive bowel sounds normal in frequency in pitch, no bruits, no rebound, no guarding, no midline pulsatile mass, no hepatomegaly, no splenomegaly, obese EXT:  2 plus pulses throughout, no edema, no cyanosis no clubbing SKIN:  No rashes no nodules NEURO:  Cranial nerves II through XII grossly intact, motor grossly intact throughout PSYCH:  Cognitively intact, oriented to person place and time  EKG:  Sinus rhythm, rate 75, axis within normal limits, intervals within normal limits, LVH voltage criteria, inferolateral T wave inversion slightly more pronounced than previous. 2011/09/13  ASSESSMENT AND PLAN

## 2011-09-02 NOTE — Patient Instructions (Signed)
The current medical regimen is effective;  continue present plan and medications.  Your physician has requested that you have a lexiscan myoview. For further information please visit [email or web address]. Please follow instruction sheet, as given.   

## 2011-09-02 NOTE — Assessment & Plan Note (Signed)
Her last LDL was 72 with an HDL of 52. She will continue the therapy as listed.

## 2011-09-02 NOTE — Assessment & Plan Note (Signed)
This may well could be an anginal equivalent. I think she needs stress testing.  She would not be able to ambulate so she would need a [employer].

## 2011-09-02 NOTE — Assessment & Plan Note (Signed)
I will check a BNP.  I will have a low threshold for repeat echochardiography

## 2011-09-16 ENCOUNTER — Ambulatory Visit (HOSPITAL_COMMUNITY): Payer: Medicare Other | Admitting: Radiology

## 2011-09-24 ENCOUNTER — Ambulatory Visit (HOSPITAL_COMMUNITY): Payer: Medicare Other | Attending: Cardiology | Admitting: Radiology

## 2011-09-24 VITALS — Ht 61.0 in | Wt 218.0 lb

## 2011-09-24 DIAGNOSIS — R0609 Other forms of dyspnea: Secondary | ICD-10-CM | POA: Insufficient documentation

## 2011-09-24 DIAGNOSIS — Z8249 Family history of ischemic heart disease and other diseases of the circulatory system: Secondary | ICD-10-CM | POA: Insufficient documentation

## 2011-09-24 DIAGNOSIS — R5381 Other malaise: Secondary | ICD-10-CM | POA: Insufficient documentation

## 2011-09-24 DIAGNOSIS — R0600 Dyspnea, unspecified: Secondary | ICD-10-CM

## 2011-09-24 DIAGNOSIS — R079 Chest pain, unspecified: Secondary | ICD-10-CM

## 2011-09-24 DIAGNOSIS — R0789 Other chest pain: Secondary | ICD-10-CM | POA: Insufficient documentation

## 2011-09-24 DIAGNOSIS — E119 Type 2 diabetes mellitus without complications: Secondary | ICD-10-CM | POA: Insufficient documentation

## 2011-09-24 DIAGNOSIS — R0602 Shortness of breath: Secondary | ICD-10-CM

## 2011-09-24 DIAGNOSIS — Z794 Long term (current) use of insulin: Secondary | ICD-10-CM | POA: Insufficient documentation

## 2011-09-24 DIAGNOSIS — E785 Hyperlipidemia, unspecified: Secondary | ICD-10-CM | POA: Insufficient documentation

## 2011-09-24 DIAGNOSIS — R42 Dizziness and giddiness: Secondary | ICD-10-CM | POA: Insufficient documentation

## 2011-09-24 DIAGNOSIS — R9431 Abnormal electrocardiogram [ECG] [EKG]: Secondary | ICD-10-CM

## 2011-09-24 DIAGNOSIS — I10 Essential (primary) hypertension: Secondary | ICD-10-CM | POA: Insufficient documentation

## 2011-09-24 DIAGNOSIS — R0989 Other specified symptoms and signs involving the circulatory and respiratory systems: Secondary | ICD-10-CM | POA: Insufficient documentation

## 2011-09-24 MED ORDER — REGADENOSON 0.4 MG/5ML IV SOLN
0.4000 mg | Freq: Once | INTRAVENOUS | Status: AC
  Administered 2011-09-24: 0.4 mg via INTRAVENOUS

## 2011-09-24 MED ORDER — TECHNETIUM TC 99M TETROFOSMIN IV KIT
33.0000 | PACK | Freq: Once | INTRAVENOUS | Status: AC | PRN
  Administered 2011-09-24: 33 via INTRAVENOUS

## 2011-09-24 MED ORDER — TECHNETIUM TC 99M TETROFOSMIN IV KIT
11.0000 | PACK | Freq: Once | INTRAVENOUS | Status: AC | PRN
  Administered 2011-09-24: 11 via INTRAVENOUS

## 2011-09-24 NOTE — Progress Notes (Signed)
[facility] SITE 3 NUCLEAR MED [address] [place] [address] [phone]  Cardiology Nuclear Med Study  Holly Weiss is a 71 y.o. 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 Tech Comments: BS@ 11:30 AM- 126    Nuclear Med Study 1 or 2 day study: 1 day  Stress Test Type:  Lexiscan  Reading MD: Willa Rough, MD  Order Authorizing Provider:  Rollene Rotunda, MD  Resting Radionuclide: Technetium 23m Tetrofosmin  Resting Radionuclide Dose: 11.0 mCi   Stress Radionuclide:  Technetium 4m Tetrofosmin  Stress Radionuclide Dose: 33.0 mCi           Stress Protocol Rest HR: 65 Stress HR: 89  Rest BP: 119/61 Stress BP: 150/51  Exercise Time (min): n/a METS: n/a   Predicted Max HR: 150 bpm % Max HR: 59.33 bpm Rate Pressure Product: 46962   Dose of Adenosine (mg):  n/a Dose of Lexiscan: 0.4 mg  Dose of Atropine (mg): n/a Dose of Dobutamine: n/a mcg/kg/min (at max HR)  Stress Test Technologist: Irean Hong, RN  Nuclear Technologist:  Doyne Keel, CNMT     Rest Procedure:  Myocardial perfusion imaging was performed at rest 45 minutes following the intravenous administration of Technetium 59m  Tetrofosmin. Rest ECG: NSR with ST-T changes  Stress Procedure:  The patient received IV Lexiscan 0.4 mg over 15-seconds.  Technetium 80m Tetrofosmin injected at 30-seconds.  The EKG was non-diagnostic due to baseline changes.  Quantitative spect images were obtained after a 45 minute delay. Stress ECG: No significant change from baseline ECG  QPS Raw Data Images:  Patient motion noted; appropriate software correction applied. Stress Images:  Small area of mild decreased uptake in the inferior wall Rest Images:  Small area of mild decreased uptake in the inferior wall Subtraction (SDS):  No evidence of ischemia. Transient Ischemic Dilatation (Normal <1.22):  1.09 Lung/Heart Ratio (Normal <0.45):  0.39  Quantitative Gated Spect Images QGS EDV:  129 ml QGS ESV:  71 ml QGS cine images:  Hypokinesis of the inferior wall QGS EF: 45%  Impression Exercise Capacity:  Lexiscan with no exercise. BP Response:  Normal blood pressure response. Clinical Symptoms:  Chest fullness ECG Impression:  No significant ST segment change suggestive of ischemia. Comparison with Prior Nuclear Study: No images to compare  Overall Impression:  There is interference from nuclear activity from structures below the diaphragm. There is some decreased activity in the inferior wall. Wall motion analysis suggests some hypokinesis of the inferior wall. By history an older study was felt to have shown some inferior scar. I do not have that exact report available. This study seems to show a similar finding. There is evidence of a small inferior scar. There  is no marked ischemia.  Willa Rough, MD

## 2011-10-23 DIAGNOSIS — E119 Type 2 diabetes mellitus without complications: Secondary | ICD-10-CM | POA: Insufficient documentation

## 2011-11-03 ENCOUNTER — Encounter: Payer: Self-pay | Admitting: Obstetrics and Gynecology

## 2011-11-03 ENCOUNTER — Ambulatory Visit (INDEPENDENT_AMBULATORY_CARE_PROVIDER_SITE_OTHER): Payer: Medicare Other | Admitting: Obstetrics and Gynecology

## 2011-11-03 VITALS — BP 130/76 | Ht 60.0 in | Wt 220.0 lb

## 2011-11-03 DIAGNOSIS — R350 Frequency of micturition: Secondary | ICD-10-CM

## 2011-11-03 DIAGNOSIS — N951 Menopausal and female climacteric states: Secondary | ICD-10-CM

## 2011-11-03 DIAGNOSIS — N952 Postmenopausal atrophic vaginitis: Secondary | ICD-10-CM

## 2011-11-03 DIAGNOSIS — Z780 Asymptomatic menopausal state: Secondary | ICD-10-CM

## 2011-11-03 NOTE — Progress Notes (Signed)
Patient came back to see me today for further followup. She has been on hormone replacement for many years. The pharmacy denied her medication several months ago so she has been off of her hormones. She is not having night sweats. She is however less focused and with more fatigue since stopping. She has not had a mammogram in several years. She has had a normal bone density in our office. She is having urinary frequency with nocturia. She does not have incontinence. She does not have dysuria or urgency. She does not have hematuria. She tell me today that she was denied her estrogen due to the pharmacy and their security. The story did not make sense to me. She does see a lot of physicians and I wonder if one of them wanted  her off her medication. She has vaginal dryness but is basically asymptomatic. She is having no vaginal bleeding or pelvic pain.  ROS: 12 system review done. Pertinent positives above. Other positives include diabetes,chronic pain syndrome with leg and hip pain, glaucoma, migraine headaches,knee pain, coronary artery disease, congestive heart disease,shoulder pain, and asthma.  Physical examination: Kennon Portela present. HEENT within normal limits. Neck: Thyroid not large. No masses. Supraclavicular nodes: not enlarged. Breasts: Examined in both sitting and lying  position. No skin changes and no masses. Abdomen: Soft no guarding rebound or masses or hernia. Pelvic: External: Within normal limits. BUS: Within normal limits. Vaginal:within normal limits. Poor estrogen effect. No evidence of cystocele rectocele or enterocele. Cervix: clean. Uterus: Normal size and shape. Adnexa: No masses. Rectovaginal exam: Confirmatory and negative. Extremities: Within normal limits.  Assessment: #1. Menopausal symptoms #2. Atrophic vaginitis #3. Urinary frequency and nocturia.  Plan: Asked  patient get a mammogram. Discussed higher  risk of breast cancer with estrogen. I told the patient that if  she felt benefits outweighed the risks I would treat her with an estrogen patch. I've asked her to have a pharmacist call me so I can sort out why they refused her the estrogen. Depending on what he says I may need to talk to one of the physicians she sees.

## 2011-11-03 NOTE — Patient Instructions (Signed)
Have your pharmacist call me.

## 2011-11-04 LAB — URINALYSIS W MICROSCOPIC + REFLEX CULTURE
Bacteria, UA: NONE SEEN
Casts: NONE SEEN
Glucose, UA: NEGATIVE mg/dL
Hgb urine dipstick: NEGATIVE
Ketones, ur: NEGATIVE mg/dL
Leukocytes, UA: NEGATIVE
pH: 5 (ref 5.0–8.0)

## 2011-11-12 ENCOUNTER — Other Ambulatory Visit: Payer: Self-pay | Admitting: Internal Medicine

## 2011-11-17 ENCOUNTER — Other Ambulatory Visit: Payer: Self-pay | Admitting: Obstetrics and Gynecology

## 2011-11-17 MED ORDER — ESTRADIOL 0.025 MG/24HR TD PTWK: 1.0000 | MEDICATED_PATCH | TRANSDERMAL | Status: DC

## 2011-11-17 NOTE — Progress Notes (Signed)
We changed the patient from oral estrogen to Climara 0.025 mg patches. She will take one weekly. She will continue her Prometrium 1 daily

## 2011-11-17 NOTE — Telephone Encounter (Signed)
THIS RX WAS ESCRIBED & BY HER HISTORY OF MEDS. SHE HAS BEEN ON THIS. ON YOUR 11-03-11 O.V. YOU MENTIONED GIVING HER A PATCH.

## 2011-12-14 ENCOUNTER — Other Ambulatory Visit: Payer: Self-pay | Admitting: Internal Medicine

## 2012-01-06 ENCOUNTER — Other Ambulatory Visit: Payer: Self-pay | Admitting: Internal Medicine

## 2012-02-08 ENCOUNTER — Other Ambulatory Visit: Payer: Self-pay | Admitting: Internal Medicine

## 2012-06-09 ENCOUNTER — Other Ambulatory Visit: Payer: Self-pay | Admitting: Internal Medicine

## 2012-07-02 ENCOUNTER — Other Ambulatory Visit: Payer: Self-pay | Admitting: Internal Medicine

## 2012-08-14 ENCOUNTER — Other Ambulatory Visit: Payer: Self-pay | Admitting: Internal Medicine

## 2012-08-30 ENCOUNTER — Ambulatory Visit: Payer: Medicare Other | Admitting: Internal Medicine

## 2012-09-06 ENCOUNTER — Ambulatory Visit (INDEPENDENT_AMBULATORY_CARE_PROVIDER_SITE_OTHER): Payer: Medicare Other | Admitting: Internal Medicine

## 2012-09-06 ENCOUNTER — Encounter: Payer: Self-pay | Admitting: Internal Medicine

## 2012-09-06 VITALS — BP 128/68 | HR 90 | Temp 98.4°F | Ht 60.0 in | Wt 211.4 lb

## 2012-09-06 DIAGNOSIS — Z23 Encounter for immunization: Secondary | ICD-10-CM

## 2012-09-06 DIAGNOSIS — I10 Essential (primary) hypertension: Secondary | ICD-10-CM

## 2012-09-06 DIAGNOSIS — E785 Hyperlipidemia, unspecified: Secondary | ICD-10-CM

## 2012-09-06 MED ORDER — INSULIN GLARGINE 100 UNIT/ML ~~LOC~~ SOLN: SUBCUTANEOUS | Status: DC

## 2012-09-06 MED ORDER — INSULIN ASPART 100 UNIT/ML ~~LOC~~ SOLN: SUBCUTANEOUS | Status: DC

## 2012-09-06 NOTE — Assessment & Plan Note (Signed)
ECG reviewed as per emr, o/w stable overall by history and exam, recent data reviewed with pt, and pt to continue medical treatment as before,  to f/u any worsening symptoms or concerns BP Readings from Last 3 Encounters:  09/06/12 128/68  11/03/11 130/76  09/02/11 170/90

## 2012-09-06 NOTE — Patient Instructions (Addendum)
You are given the sample of the lantus Please continue all other medications as before, and refills have been done if requested - the lantus and the novolog pen Please take the prescriptions to the pharmacy to start the "Prior Authorization" process We can hold off on lab work today

## 2012-09-11 ENCOUNTER — Encounter: Payer: Self-pay | Admitting: Internal Medicine

## 2012-09-11 NOTE — Assessment & Plan Note (Signed)
stable overall by history and exam, recent data reviewed with pt, and pt to continue medical treatment as before,  to f/u any worsening symptoms or concerns Lab Results  Component Value Date   LDLCALC 72 07/21/2011    

## 2012-09-11 NOTE — Progress Notes (Signed)
Subjective:    Patient ID: Holly Weiss, female    DOB: 1941/02/17, 72 y.o.   MRN: [id number]  HPI  Here to f/u; overall doing ok,  Pt denies chest pain, increased sob or doe, wheezing, orthopnea, PND, increased LE swelling, palpitations, dizziness or syncope.  Pt denies polydipsia, polyuria, or low sugar symptoms such as weakness or confusion improved with po intake.  Pt denies new neurological symptoms such as new headache, or facial or extremity weakness or numbness. has been trying to follow lower cholesterol, diabetic diet, with wt overall stable,  but little exercise however, and unfortunately out of insulin for 1 mo due to cost, and was not sure what a Prior Authorization meant for her in order to get her lantus.   Past Medical History  Diagnosis Date  . Abdominal pain, epigastric 08/20/2008  . ALLERGIC RHINITIS 01/18/2008  . ANEMIA-NOS 01/18/2008  . 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 ASTHMA, WITH ACUTE EXACERBATION 11/29/2008  . CHEST PAIN 04/09/2009  . CHF 01/18/2008  . Chronic pain syndrome 01/21/2010  . DIABETIC  RETINOPATHY 05/30/2007  . Diarrhea 07/24/2010  . DM, UNCOMPLICATED, TYPE II, UNCONTROLLED 05/30/2007  . DYSPEPSIA 01/21/2010  . DYSPNEA ON EXERTION 01/21/2010  . DYSPNEA 01/18/2008  . Edema 09/09/2010  . FATIGUE 01/21/2010  . 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 HYPERLIPIDEMIA 05/30/2007  . HYPERTENSION 10/31/2007  . JOINT EFFUSION, LEFT KNEE 11/29/2008  . KNEE PAIN, BILATERAL 09/01/2010  . LOW BACK PAIN 01/18/2008  . MORBID OBESITY, HX OF 05/30/2007  . MYOCARDIAL PERFUSION SCAN, WITH STRESS TEST, ABNORMAL 05/01/2009  . NAUSEA 04/09/2009  . NEPHROLITHIASIS, HX OF 01/18/2008  . SHOULDER PAIN, BILATERAL 04/09/2009  . Migraine headache   . Spinal stenosis   . Bilateral carpal tunnel syndrome   . History of colonoscopy   . Diabetes mellitus    Past Surgical History  Procedure Laterality Date  . Cataract extraction    . Cholecystectomy    . Thoracic fusion    . Lumbar disc surgury    .  Tubal ligation      reports that she has never smoked. She does not have any smokeless tobacco history on file. She reports that she does not drink alcohol or use illicit drugs. family history includes Diabetes in her mother and sister; Heart disease in her father and mother; and Lung cancer in her father. No Known Allergies Current Outpatient Prescriptions on File Prior to Visit  Medication Sig Dispense Refill  . ALPRAZolam (XANAX) 0.25 MG tablet Take 0.25 mg by mouth 3 (three) times daily as needed.        Marland Kitchen aspirin 81 MG tablet Take 81 mg by mouth daily.        . B-D ULTRAFINE III SHORT PEN 31G X 8 MM MISC USE TWO TIMES A DAY AS DIRECTED  100 each  8  . CRESTOR 20 MG tablet TAKE ONE-HALF TABLET DAILY  30 tablet  11  . cyclobenzaprine (FLEXERIL) 5 MG tablet 1 by mouth two times a day for 5 days, then as needed for spasm pain.       Marland Kitchen Esterified Estrogens (MENEST) 0.3 MG tablet Take 0.3 mg by mouth daily.        Marland Kitchen estradiol (CLIMARA) 0.025 mg/24hr Place 1 patch (0.025 mg total) onto the skin once a week.  4 patch  12  . furosemide (LASIX) 40 MG tablet TAKE 1 TABLET BY MOUTH DAILY.  30 tablet  4  . glucosamine-chondroitin 500-400 MG tablet Take 1  tablet by mouth daily.      Marland Kitchen glucose blood (FREESTYLE LITE) test strip Use as directed       . metFORMIN (GLUCOPHAGE-XR) 500 MG 24 hr tablet TAKE 4 TABLET BY MOUTH DAILY WITH BREAKFAST.  120 tablet  9  . Multiple Vitamins-Minerals (ONE-A-DAY EXTRAS ANTIOXIDANT PO) Take by mouth daily.        . NON FORMULARY Take by mouth daily. Circulation and view support for healthy legs      . PROMETRIUM 100 MG capsule TAKE 1 CAPSULE EVERY DAY  30 capsule  7  . QVAR 80 MCG/ACT inhaler TAKE 1 PUFF TWICE A DAY  8.7 g  0   No current facility-administered medications on file prior to visit.   Review of Systems  Constitutional: Negative for unexpected weight change, or unusual diaphoresis  HENT: Negative for tinnitus.   Eyes: Negative for photophobia and visual  disturbance.  Respiratory: Negative for choking and stridor.   Gastrointestinal: Negative for vomiting and blood in stool.  Genitourinary: Negative for hematuria and decreased urine volume.  Musculoskeletal: Negative for acute joint swelling Skin: Negative for color change and wound.  Neurological: Negative for tremors and numbness other than noted  Psychiatric/Behavioral: Negative for decreased concentration or  hyperactivity.       Objective:   Physical Exam BP 128/68  Pulse 90  Temp(Src) 98.4 F (36.9 C) (Oral)  Ht 5' (1.524 m)  Wt 211 lb 6 oz (95.879 kg)  BMI 41.28 kg/m2  SpO2 98% VS noted, not ill appearing Constitutional: Pt appears well-developed and well-nourished.  HENT: Head: NCAT.  Right Ear: External ear normal.  Left Ear: External ear normal.  Eyes: Conjunctivae and EOM are normal. Pupils are equal, round, and reactive to light.  Neck: Normal range of motion. Neck supple.  Cardiovascular: Normal rate and regular rhythm.   Pulmonary/Chest: Effort normal and breath sounds normal.  Neurological: Pt is alert. Not confused  Skin: Skin is warm. No erythema.  Psychiatric: Pt behavior is normal. Thought content normal.     Assessment & Plan:

## 2012-09-11 NOTE — Assessment & Plan Note (Signed)
Asympt, but likely uncontrolled as she has not been taking her insulin.  Gave sample to re-start lantus today, and rx for meds, educated on the prior auth process and will look forward to doing this for her when notified per pharmacy of the need as per usual

## 2012-11-07 ENCOUNTER — Telehealth: Payer: Self-pay

## 2012-11-07 NOTE — Telephone Encounter (Signed)
I think ok to try, but suspect it will be denied since they are essentially the same medication

## 2012-11-07 NOTE — Telephone Encounter (Signed)
Pharmacy called to inform MD that Novolog is no longer covered however Humolog is covered. 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 Pt is requesting office contact her insurance company to initiate coverage of Novolog at [phone]

## 2012-11-09 NOTE — Telephone Encounter (Signed)
Called to have form faxed for completion.  Will contact the patient once know results.

## 2012-11-11 ENCOUNTER — Telehealth: Payer: Self-pay

## 2012-11-11 NOTE — Telephone Encounter (Signed)
[employer] approved Novolog Flexpen for this patient from 11/09/12 through 11/09/2013.  Patient has been notified by [facility].

## 2012-12-02 ENCOUNTER — Other Ambulatory Visit: Payer: Self-pay | Admitting: Internal Medicine

## 2012-12-08 ENCOUNTER — Telehealth: Payer: Self-pay | Admitting: Internal Medicine

## 2012-12-08 MED ORDER — INSULIN ASPART 100 UNIT/ML ~~LOC~~ SOLN: SUBCUTANEOUS | Status: DC

## 2012-12-08 NOTE — Telephone Encounter (Signed)
Pt req another medicare other than novolog due to reactions she has: throat burning, body itching. Pt req written prescription and will come pick this up. Please advise. Offer to talk to the nurse about the reactions, but pt just want another medication, dont need the nurse.

## 2012-12-08 NOTE — Telephone Encounter (Signed)
This type of side effect would be VERY unusual to be related to the insulin  Please consider other etiology, such as allergies and post nasal gtt this time of yr  We can change to Humalog or Apidra if covered by her insurance if she really wants, but this is not likely to help the throat

## 2012-12-08 NOTE — Telephone Encounter (Signed)
Informed the patient of MD instructions.  She stated she would like her novolog to be sent to [facility].  Stated she will stay on novolog but does want a different pharmacy

## 2013-01-05 ENCOUNTER — Other Ambulatory Visit: Payer: Self-pay | Admitting: Internal Medicine

## 2013-01-13 ENCOUNTER — Telehealth: Payer: Self-pay

## 2013-01-13 MED ORDER — TIZANIDINE HCL 4 MG PO TABS: 4.0000 mg | ORAL_TABLET | Freq: Four times a day (QID) | ORAL | Status: DC | PRN

## 2013-01-13 NOTE — Telephone Encounter (Signed)
Patient informed. 

## 2013-01-13 NOTE — Telephone Encounter (Signed)
Ok , but consider sat clinic if not improved

## 2013-01-13 NOTE — Telephone Encounter (Signed)
Pt called [facility] c/o leg pain and swelling (above knees) and would like a medication for a muscle relaxer.

## 2013-01-24 ENCOUNTER — Ambulatory Visit: Payer: Medicare Other | Admitting: Internal Medicine

## 2013-01-24 DIAGNOSIS — Z0289 Encounter for other administrative examinations: Secondary | ICD-10-CM

## 2013-01-30 ENCOUNTER — Encounter (HOSPITAL_COMMUNITY): Payer: Self-pay | Admitting: Emergency Medicine

## 2013-01-30 ENCOUNTER — Emergency Department (HOSPITAL_COMMUNITY): Payer: Medicare Other

## 2013-01-30 ENCOUNTER — Emergency Department (HOSPITAL_COMMUNITY)
Admission: EM | Admit: 2013-01-30 | Discharge: 2013-01-30 | Disposition: A | Discharge status: Home / Self Care | Payer: Medicare Other | Attending: Emergency Medicine | Admitting: Emergency Medicine

## 2013-01-30 DIAGNOSIS — Z794 Long term (current) use of insulin: Secondary | ICD-10-CM | POA: Insufficient documentation

## 2013-01-30 DIAGNOSIS — IMO0001 Reserved for inherently not codable concepts without codable children: Secondary | ICD-10-CM | POA: Insufficient documentation

## 2013-01-30 DIAGNOSIS — M545 Low back pain, unspecified: Secondary | ICD-10-CM | POA: Insufficient documentation

## 2013-01-30 DIAGNOSIS — Z87442 Personal history of urinary calculi: Secondary | ICD-10-CM | POA: Insufficient documentation

## 2013-01-30 DIAGNOSIS — M1710 Unilateral primary osteoarthritis, unspecified knee: Secondary | ICD-10-CM | POA: Insufficient documentation

## 2013-01-30 DIAGNOSIS — I10 Essential (primary) hypertension: Secondary | ICD-10-CM | POA: Insufficient documentation

## 2013-01-30 DIAGNOSIS — G894 Chronic pain syndrome: Secondary | ICD-10-CM | POA: Insufficient documentation

## 2013-01-30 DIAGNOSIS — Z8669 Personal history of other diseases of the nervous system and sense organs: Secondary | ICD-10-CM | POA: Insufficient documentation

## 2013-01-30 DIAGNOSIS — Z862 Personal history of diseases of the blood and blood-forming organs and certain disorders involving the immune mechanism: Secondary | ICD-10-CM | POA: Insufficient documentation

## 2013-01-30 DIAGNOSIS — Z7982 Long term (current) use of aspirin: Secondary | ICD-10-CM | POA: Insufficient documentation

## 2013-01-30 DIAGNOSIS — E1139 Type 2 diabetes mellitus with other diabetic ophthalmic complication: Secondary | ICD-10-CM | POA: Insufficient documentation

## 2013-01-30 DIAGNOSIS — M79609 Pain in unspecified limb: Secondary | ICD-10-CM | POA: Insufficient documentation

## 2013-01-30 DIAGNOSIS — IMO0002 Reserved for concepts with insufficient information to code with codable children: Secondary | ICD-10-CM | POA: Insufficient documentation

## 2013-01-30 DIAGNOSIS — E1165 Type 2 diabetes mellitus with hyperglycemia: Secondary | ICD-10-CM | POA: Insufficient documentation

## 2013-01-30 DIAGNOSIS — Z79899 Other long term (current) drug therapy: Secondary | ICD-10-CM | POA: Insufficient documentation

## 2013-01-30 DIAGNOSIS — E785 Hyperlipidemia, unspecified: Secondary | ICD-10-CM | POA: Insufficient documentation

## 2013-01-30 DIAGNOSIS — J45901 Unspecified asthma with (acute) exacerbation: Secondary | ICD-10-CM | POA: Insufficient documentation

## 2013-01-30 DIAGNOSIS — E11319 Type 2 diabetes mellitus with unspecified diabetic retinopathy without macular edema: Secondary | ICD-10-CM | POA: Insufficient documentation

## 2013-01-30 DIAGNOSIS — I509 Heart failure, unspecified: Secondary | ICD-10-CM | POA: Insufficient documentation

## 2013-01-30 DIAGNOSIS — M4800 Spinal stenosis, site unspecified: Secondary | ICD-10-CM | POA: Insufficient documentation

## 2013-01-30 DIAGNOSIS — Z8679 Personal history of other diseases of the circulatory system: Secondary | ICD-10-CM | POA: Insufficient documentation

## 2013-01-30 DIAGNOSIS — Z8739 Personal history of other diseases of the musculoskeletal system and connective tissue: Secondary | ICD-10-CM | POA: Insufficient documentation

## 2013-01-30 DIAGNOSIS — M170 Bilateral primary osteoarthritis of knee: Secondary | ICD-10-CM

## 2013-01-30 LAB — CBC WITH DIFFERENTIAL/PLATELET
HCT: 30.7 % — ABNORMAL LOW (ref 36.0–46.0)
Hemoglobin: 10.4 g/dL — ABNORMAL LOW (ref 12.0–15.0)
Lymphs Abs: 1.9 10*3/uL (ref 0.7–4.0)
MCH: 30 pg (ref 26.0–34.0)
MCHC: 33.9 g/dL (ref 30.0–36.0)
Monocytes Absolute: 0.6 10*3/uL (ref 0.1–1.0)
Monocytes Relative: 10 % (ref 3–12)
Neutro Abs: 2.8 10*3/uL (ref 1.7–7.7)
Neutrophils Relative %: 50 % (ref 43–77)
RBC: 3.47 MIL/uL — ABNORMAL LOW (ref 3.87–5.11)

## 2013-01-30 LAB — BASIC METABOLIC PANEL
BUN: 41 mg/dL — ABNORMAL HIGH (ref 6–23)
Chloride: 104 mEq/L (ref 96–112)
Creatinine, Ser: 1.3 mg/dL — ABNORMAL HIGH (ref 0.50–1.10)
Glucose, Bld: 67 mg/dL — ABNORMAL LOW (ref 70–99)
Potassium: 4.9 mEq/L (ref 3.5–5.1)

## 2013-01-30 MED ORDER — CYCLOBENZAPRINE HCL 10 MG PO TABS: 10.0000 mg | ORAL_TABLET | Freq: Three times a day (TID) | ORAL | Status: DC | PRN

## 2013-01-30 MED ORDER — HYDROCODONE-ACETAMINOPHEN 5-325 MG PO TABS: 1.0000 | ORAL_TABLET | Freq: Four times a day (QID) | ORAL | Status: DC | PRN

## 2013-01-30 MED ORDER — HYDROCODONE-ACETAMINOPHEN 5-325 MG PO TABS
1.0000 | ORAL_TABLET | Freq: Once | ORAL | Status: AC
  Administered 2013-01-30: 1 via ORAL
  Filled 2013-01-30: qty 1

## 2013-01-30 NOTE — ED Notes (Signed)
Lab at bedside

## 2013-01-30 NOTE — ED Provider Notes (Signed)
History    CSN: [id number] Arrival date & time 01/30/13  1008  First MD Initiated Contact with Patient 01/30/13 1109     Chief Complaint  Patient presents with  . Leg Pain  . Leg Swelling   (Consider location/radiation/quality/duration/timing/severity/associated sxs/prior Treatment) Patient is a 72 y.o. female presenting with leg pain.  Leg Pain  Pt with history of low back fusion many years ago reports 2-3 months of leg pain and swelling R>L, worse with walking and unable to bear weight now due to pain. She reports she had similar symptoms around her back surgery and was given a 'muscle relaxer' which helped. She called her PCP office a few days ago to request a new muscle relaxer and was given Rx for Zanaflex which has not helped. She denies any falls or injuries. She denies CP, SOB. No recent travel.   Past Medical History  Diagnosis Date  . Abdominal pain, epigastric 08/20/2008  . ALLERGIC RHINITIS 01/18/2008  . ANEMIA-NOS 01/18/2008  . 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 ASTHMA, WITH ACUTE EXACERBATION 11/29/2008  . CHEST PAIN 04/09/2009  . CHF 01/18/2008  . Chronic pain syndrome 01/21/2010  . DIABETIC  RETINOPATHY 05/30/2007  . Diarrhea 07/24/2010  . DM, UNCOMPLICATED, TYPE II, UNCONTROLLED 05/30/2007  . DYSPEPSIA 01/21/2010  . DYSPNEA ON EXERTION 01/21/2010  . DYSPNEA 01/18/2008  . Edema 09/09/2010  . FATIGUE 01/21/2010  . 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 HYPERLIPIDEMIA 05/30/2007  . HYPERTENSION 10/31/2007  . JOINT EFFUSION, LEFT KNEE 11/29/2008  . KNEE PAIN, BILATERAL 09/01/2010  . LOW BACK PAIN 01/18/2008  . MORBID OBESITY, HX OF 05/30/2007  . MYOCARDIAL PERFUSION SCAN, WITH STRESS TEST, ABNORMAL 05/01/2009  . NAUSEA 04/09/2009  . NEPHROLITHIASIS, HX OF 01/18/2008  . SHOULDER PAIN, BILATERAL 04/09/2009  . Migraine headache   . Spinal stenosis   . Bilateral carpal tunnel syndrome   . History of colonoscopy   . Diabetes mellitus    Past Surgical History  Procedure Laterality Date   . Cataract extraction    . Cholecystectomy    . Thoracic fusion    . Lumbar disc surgury    . Tubal ligation     Family History  Problem Relation Age of Onset  . Diabetes Mother   . Heart disease Mother   . Lung cancer Father   . Heart disease Father   . Diabetes Sister    History  Substance Use Topics  . Smoking status: Never Smoker   . Smokeless tobacco: Not on file  . Alcohol Use: No   OB History   Grav Para Term Preterm Abortions TAB SAB Ect Mult Living   5 5   0     5     Review of Systems All other systems reviewed and are negative except as noted in HPI.   Allergies  Review of patient's allergies indicates no known allergies.  Home Medications   Current Outpatient Rx  Name  Route  Sig  Dispense  Refill  . aspirin 81 MG tablet   Oral   Take 81 mg by mouth daily.           . B-D ULTRAFINE III SHORT PEN 31G X 8 MM MISC      USE TWO TIMES A DAY AS DIRECTED   100 each   8   . B-D ULTRAFINE III SHORT PEN 31G X 8 MM MISC      USE TWO TIMES A DAY AS DIRECTED   200 each  3   . BIOTIN PO   Oral   Take 1 tablet by mouth daily.         . CRESTOR 20 MG tablet      TAKE ONE-HALF TABLET DAILY   30 tablet   3   . cyclobenzaprine (FLEXERIL) 5 MG tablet      1 by mouth two times a day for 5 days, then as needed for spasm pain.          . furosemide (LASIX) 40 MG tablet   Oral   Take 40 mg by mouth 2 (two) times daily.         Marland Kitchen glucose blood (FREESTYLE LITE) test strip      Use as directed          . insulin aspart (NOVOLOG FLEXPEN) 100 UNIT/ML injection      INJECT 15 UNITS UNDER THE SKIN ONCE A DAY   10 mL   6   . insulin glargine (LANTUS) 100 UNIT/ML injection   Subcutaneous   Inject 37 Units into the skin daily.         . metFORMIN (GLUCOPHAGE) 500 MG tablet   Oral   Take 1,000 mg by mouth 2 (two) times daily with a meal.         . Misc Natural Products (OSTEO BI-FLEX JOINT SHIELD PO)   Oral   Take 1 tablet by mouth  daily.         . Multiple Vitamins-Minerals (ONE-A-DAY EXTRAS ANTIOXIDANT PO)   Oral   Take by mouth daily.           . naproxen sodium (ANAPROX) 220 MG tablet   Oral   Take 220 mg by mouth 2 (two) times daily as needed. For pain         . OVER THE COUNTER MEDICATION   Topical   Apply 1 application topically 3 (three) times daily as needed. for pain         . QVAR 80 MCG/ACT inhaler      TAKE 1 PUFF TWICE A DAY   8.7 g   0     Patient needs appointment to continue getting refi ...   . tiZANidine (ZANAFLEX) 4 MG tablet   Oral   Take 1 tablet (4 mg total) by mouth every 6 (six) hours as needed.   40 tablet   1    BP 145/59  Pulse 59  Temp(Src) 98 F (36.7 C) (Oral)  Resp 15  SpO2 99% Physical Exam  Nursing note and vitals reviewed. Constitutional: She is oriented to person, place, and time. She appears well-developed and well-nourished.  HENT:  Head: Normocephalic and atraumatic.  Eyes: EOM are normal. Pupils are equal, round, and reactive to light.  Neck: Normal range of motion. Neck supple.  Cardiovascular: Normal rate, normal heart sounds and intact distal pulses.   Pulmonary/Chest: Effort normal and breath sounds normal.  Abdominal: Bowel sounds are normal. She exhibits no distension. There is no tenderness.  Musculoskeletal: Normal range of motion. She exhibits edema (trace, bilateral, equal) and tenderness (tender over the knee bilaterally, R>L, worse with passive ROM. ).  Neurological: She is alert and oriented to person, place, and time. She has normal strength. No cranial nerve deficit or sensory deficit.  Skin: Skin is warm and dry. No rash noted.  Psychiatric: She has a normal mood and affect.    ED Course  Procedures (including critical care time) Labs Reviewed  CBC WITH  DIFFERENTIAL - Abnormal; Notable for the following:    RBC 3.47 (*)    Hemoglobin 10.4 (*)    HCT 30.7 (*)    Eosinophils Relative 7 (*)    All other components within  normal limits  BASIC METABOLIC PANEL - Abnormal; Notable for the following:    Glucose, Bld 67 (*)    BUN 41 (*)    Creatinine, Ser 1.30 (*)    GFR calc non Af Amer 40 (*)    GFR calc Af Amer 46 (*)    All other components within normal limits   Dg Knee Complete 4 Views Left  01/30/2013   *RADIOLOGY REPORT*  Clinical Data: 72 year old female with pain.  LEFT KNEE - COMPLETE 4+ VIEW  Comparison: None.  Findings: Severe tricompartmental degenerative spurring.  Moderate to large joint effusion. Chondrocalcinosis which can be seen in the setting of calcium pyrophosphate deposition disease.  The patella appears intact.  No acute fracture or dislocation identified.  IMPRESSION: Severe tricompartmental degenerative changes with moderate to large joint effusion.   Original Report Authenticated By: Erskine Speed, M.D.   Dg Knee Complete 4 Views Right  01/30/2013   *RADIOLOGY REPORT*  Clinical Data: 72 year old female with pain.  RIGHT KNEE - COMPLETE 4+ VIEW  Comparison: None.  Findings: Severe tricompartmental degenerative spurring.  Mild lateral subluxation of the knee joint.  Bulky fragment along the medial tibial plateau.  Small to moderate joint effusion.  Patella intact.  No acute fracture or dislocation identified.  IMPRESSION: Advanced tricompartmental degenerative changes, maximal at the medial compartment.  Mild lateral subluxation of the knee joint. Small to moderate joint effusion.  No definite acute fracture.   Original Report Authenticated By: Erskine Speed, M.D.   1. Osteoarthritis of both knees     MDM  Pt's pain seems to originate in knees and not because of swelling as there is minimal edema, no Homan's sign, doubt this is DVT. Labs ordered in triage. Will send for knee xray.   1:15 PM Xray and lab results reviewed. Severe bilateral osteoarthritis as the cause of her pain. Advised that she may need to see ortho about injections and/or surgery to help her pain but she states she has been  through injections before and couldn't tolerate them and was told she was not a candidate for surgery. Given knee immobilizer for right knee since it is worse. Pain medications if needed and close PCP followup.   Emauri Krygier B. Bernette Mayers, MD 01/30/13 1318

## 2013-01-30 NOTE — ED Notes (Signed)
Pt c/o bilateral leg pain and swelling x 3 months; pt sts started when had change in medicine but unsure of change

## 2013-02-07 ENCOUNTER — Other Ambulatory Visit: Payer: Self-pay | Admitting: Internal Medicine

## 2013-02-07 ENCOUNTER — Telehealth: Payer: Self-pay | Admitting: Internal Medicine

## 2013-02-07 NOTE — Telephone Encounter (Signed)
When Holly Weiss was in the hospital she was given pain medicine (hydrocodone)  and cyclobenzaprine for muscle spasms. She is out of the hydrocodone.  She has taken 2 instead of 1.  She is making an appt to come in on  Wed. [facility] on [address].

## 2013-02-08 ENCOUNTER — Ambulatory Visit: Payer: Medicare Other | Admitting: Internal Medicine

## 2013-02-14 ENCOUNTER — Encounter: Payer: Self-pay | Admitting: Internal Medicine

## 2013-02-14 ENCOUNTER — Ambulatory Visit (INDEPENDENT_AMBULATORY_CARE_PROVIDER_SITE_OTHER): Payer: Medicare Other | Admitting: Internal Medicine

## 2013-02-14 ENCOUNTER — Other Ambulatory Visit (INDEPENDENT_AMBULATORY_CARE_PROVIDER_SITE_OTHER): Payer: Medicare Other

## 2013-02-14 VITALS — BP 122/62 | HR 86 | Temp 98.5°F | Wt 205.0 lb

## 2013-02-14 DIAGNOSIS — R441 Visual hallucinations: Secondary | ICD-10-CM | POA: Insufficient documentation

## 2013-02-14 DIAGNOSIS — R413 Other amnesia: Secondary | ICD-10-CM

## 2013-02-14 DIAGNOSIS — M25569 Pain in unspecified knee: Secondary | ICD-10-CM

## 2013-02-14 DIAGNOSIS — H5316 Psychophysical visual disturbances: Secondary | ICD-10-CM

## 2013-02-14 DIAGNOSIS — M25561 Pain in right knee: Secondary | ICD-10-CM | POA: Insufficient documentation

## 2013-02-14 DIAGNOSIS — F329 Major depressive disorder, single episode, unspecified: Secondary | ICD-10-CM

## 2013-02-14 DIAGNOSIS — F32A Depression, unspecified: Secondary | ICD-10-CM

## 2013-02-14 DIAGNOSIS — F0390 Unspecified dementia without behavioral disturbance: Secondary | ICD-10-CM | POA: Insufficient documentation

## 2013-02-14 LAB — CBC WITH DIFFERENTIAL/PLATELET
Basophils Absolute: 0.1 10*3/uL (ref 0.0–0.1)
Lymphocytes Relative: 26.5 % (ref 12.0–46.0)
Monocytes Relative: 10.7 % (ref 3.0–12.0)
Platelets: 224 10*3/uL (ref 150.0–400.0)
RDW: 14.9 % — ABNORMAL HIGH (ref 11.5–14.6)

## 2013-02-14 LAB — BASIC METABOLIC PANEL
CO2: 28 mEq/L (ref 19–32)
Glucose, Bld: 142 mg/dL — ABNORMAL HIGH (ref 70–99)
Potassium: 4.2 mEq/L (ref 3.5–5.1)
Sodium: 139 mEq/L (ref 135–145)

## 2013-02-14 LAB — HEPATIC FUNCTION PANEL
AST: 26 U/L (ref 0–37)
Albumin: 3.6 g/dL (ref 3.5–5.2)
Alkaline Phosphatase: 59 U/L (ref 39–117)
Total Protein: 7.5 g/dL (ref 6.0–8.3)

## 2013-02-14 LAB — TSH: TSH: 1.62 u[IU]/mL (ref 0.35–5.50)

## 2013-02-14 MED ORDER — CITALOPRAM HYDROBROMIDE 10 MG PO TABS: 10.0000 mg | ORAL_TABLET | Freq: Every day | ORAL | Status: DC

## 2013-02-14 MED ORDER — INSULIN ASPART 100 UNIT/ML ~~LOC~~ SOLN: SUBCUTANEOUS | Status: DC

## 2013-02-14 NOTE — Assessment & Plan Note (Signed)
With marked mobility loss, liekly flare DJD - for ortho referral

## 2013-02-14 NOTE — Assessment & Plan Note (Addendum)
prob dementia? Family is supportive in helping her in her diminished capacity to care for herself and safe for now, for MRI head, refer neuro, labs today (open MRI as pt declares claustrophobia)  Note:  Total time for pt hx, exam, review of record with pt in the room, determination of diagnoses and plan for further eval and tx is > 40 min, with over 50% spent in coordination and counseling of patient

## 2013-02-14 NOTE — Assessment & Plan Note (Signed)
?   Control, stable overall by history and exam, recent data reviewed with pt, and pt to continue medical treatment as before,  to f/u any worsening symptoms or concerns Lab Results  Component Value Date   HGBA1C 8.0* 07/21/2011

## 2013-02-14 NOTE — Patient Instructions (Signed)
Please decrease the Novolog from 15 to 5 units per day in the AM (or with your largest meal of the day) Please take all new medication as prescribed - the citalopram at 10 mg per day Please continue all other medications as before, including the lantus at 37 units You will be contacted regarding the referral for: Open MRI, and Neurology, and orthopedic Please go to the LAB in the Basement (turn left off the elevator) for the tests to be done today You will be contacted by phone if any changes need to be made immediately.  Otherwise, you will receive a letter about your results with an explanation, but please check with MyChart first.  Please remember to sign up for My Chart if you have not done so, as this will be important to you in the future with finding out test results, communicating by private email, and scheduling acute appointments online when needed.  Please return in 3-4 weeks, or sooner if needed

## 2013-02-14 NOTE — Assessment & Plan Note (Signed)
For citalopram 10 qd

## 2013-02-14 NOTE — Progress Notes (Signed)
Subjective:    Patient ID: Holly Weiss, female    DOB: 1941/01/07, 72 y.o.   MRN: [id number]  HPI  Here with family - sister and daughter, concerned as pt is Having intermittent visual  hallucinations it seems seeing 8 men at night sleeping/lying about the house, off and on for months. Has been more depressed recently, denies SI   Could not tolerate tizandine.  Had a severe low sugar this am for first time and hx difficult due to pt memory but possibly had less to eat and took 15 units novolog.  sister called ems, cbg about 30 on arrival, then increased to 157 after EMS tx.  Pt lives alone, duaghter stays at night due to increased problems as above.Also with acute onset 2+ pain/swelling to right knee, no fever, falls or pain but marked decreased mobility, has cane with her today, unwilling to try up on exam table.  Has seen ortho remotely, none recent Past Medical History  Diagnosis Date  . Abdominal pain, epigastric 08/20/2008  . ALLERGIC RHINITIS 01/18/2008  . ANEMIA-NOS 01/18/2008  . 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 ASTHMA, WITH ACUTE EXACERBATION 11/29/2008  . CHEST PAIN 04/09/2009  . CHF 01/18/2008  . Chronic pain syndrome 01/21/2010  . DIABETIC  RETINOPATHY 05/30/2007  . Diarrhea 07/24/2010  . DM, UNCOMPLICATED, TYPE II, UNCONTROLLED 05/30/2007  . DYSPEPSIA 01/21/2010  . DYSPNEA ON EXERTION 01/21/2010  . DYSPNEA 01/18/2008  . Edema 09/09/2010  . FATIGUE 01/21/2010  . 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 HYPERLIPIDEMIA 05/30/2007  . HYPERTENSION 10/31/2007  . JOINT EFFUSION, LEFT KNEE 11/29/2008  . KNEE PAIN, BILATERAL 09/01/2010  . LOW BACK PAIN 01/18/2008  . MORBID OBESITY, HX OF 05/30/2007  . MYOCARDIAL PERFUSION SCAN, WITH STRESS TEST, ABNORMAL 05/01/2009  . NAUSEA 04/09/2009  . NEPHROLITHIASIS, HX OF 01/18/2008  . SHOULDER PAIN, BILATERAL 04/09/2009  . Migraine headache   . Spinal stenosis   . Bilateral carpal tunnel syndrome   . History of colonoscopy   . Diabetes mellitus    Past Surgical  History  Procedure Laterality Date  . Cataract extraction    . Cholecystectomy    . Thoracic fusion    . Lumbar disc surgury    . Tubal ligation      reports that she has never smoked. She does not have any smokeless tobacco history on file. She reports that she does not drink alcohol or use illicit drugs. family history includes Diabetes in her mother and sister; Heart disease in her father and mother; and Lung cancer in her father. No Known Allergies Current Outpatient Prescriptions on File Prior to Visit  Medication Sig Dispense Refill  . aspirin 81 MG tablet Take 81 mg by mouth daily.        . B-D ULTRAFINE III SHORT PEN 31G X 8 MM MISC USE TWO TIMES A DAY AS DIRECTED  100 each  8  . B-D ULTRAFINE III SHORT PEN 31G X 8 MM MISC USE TWO TIMES A DAY AS DIRECTED  200 each  3  . BIOTIN PO Take 1 tablet by mouth daily.      . CRESTOR 20 MG tablet TAKE ONE-HALF TABLET DAILY  30 tablet  3  . cyclobenzaprine (FLEXERIL) 10 MG tablet TAKE 1 TABLET BY MOUTH 3 TIMES A DAY AS NEEDED FOR MUSCLE SPASMS  90 tablet  1  . furosemide (LASIX) 40 MG tablet Take 40 mg by mouth 2 (two) times daily.      Marland Kitchen glucose blood (FREESTYLE  LITE) test strip Use as directed       . HYDROcodone-acetaminophen (NORCO/VICODIN) 5-325 MG per tablet Take 1-2 tablets by mouth every 6 (six) hours as needed for pain.  30 tablet  0  . insulin glargine (LANTUS) 100 UNIT/ML injection Inject 37 Units into the skin daily.      . metFORMIN (GLUCOPHAGE) 500 MG tablet Take 1,000 mg by mouth 2 (two) times daily with a meal.      . Misc Natural Products (OSTEO BI-FLEX JOINT SHIELD PO) Take 1 tablet by mouth daily.      . Multiple Vitamins-Minerals (ONE-A-DAY EXTRAS ANTIOXIDANT PO) Take by mouth daily.        . naproxen sodium (ANAPROX) 220 MG tablet Take 220 mg by mouth 2 (two) times daily as needed. For pain      . OVER THE COUNTER MEDICATION Apply 1 application topically 3 (three) times daily as needed. for pain      . QVAR 80 MCG/ACT  inhaler TAKE 1 PUFF TWICE A DAY  8.7 g  0   No current facility-administered medications on file prior to visit.   Review of Systems  Constitutional: Negative for unexpected weight change, or unusual diaphoresis  HENT: Negative for tinnitus.   Eyes: Negative for photophobia and visual disturbance.  Respiratory: Negative for choking and stridor.   Gastrointestinal: Negative for vomiting and blood in stool.  Genitourinary: Negative for hematuria and decreased urine volume.  Musculoskeletal: Negative for acute joint swelling Skin: Negative for color change and wound.  Neurological: Negative for tremors and numbness other than noted  Psychiatric/Behavioral: Negative for decreased concentration or  hyperactivity.       Objective:   Physical Exam BP 122/62  Pulse 86  Temp(Src) 98.5 F (36.9 C) (Oral)  Wt 205 lb (92.987 kg)  BMI 40.04 kg/m2  SpO2 94% VS noted, not ill appearing but seems in pain, moves right leg little Constitutional: Pt appears well-developed and well-nourished. Holly Weiss HENT: Head: NCAT.  Right Ear: External ear normal.  Left Ear: External ear normal.  Eyes: Conjunctivae and EOM are normal. Pupils are equal, round, and reactive to light.  Neck: Normal range of motion. Neck supple.  Cardiovascular: Normal rate and regular rhythm.   Pulmonary/Chest: Effort normal and breath sounds normal.  Abd:  Soft, NT, non-distended, + BS Right knee 2+ effusion, diffuse tender, decreased ROM Neurological: Pt is alert. Moves all 4's o/w uncooperative Skin: Skin is warm. No erythema. trace RLE edema below rightknee Psychiatric: Pt behavior is normal. Thought content w/ prob memory loss, somewhat belligerant and uncooperative, not actively hallucinating, + dysphoric    Assessment & Plan:

## 2013-02-14 NOTE — Assessment & Plan Note (Signed)
?   Related to dementia vs depression or other, cont to follow for now

## 2013-02-21 ENCOUNTER — Other Ambulatory Visit: Payer: Medicare Other

## 2013-03-17 ENCOUNTER — Encounter: Payer: Self-pay | Admitting: Nurse Practitioner

## 2013-03-17 ENCOUNTER — Ambulatory Visit (INDEPENDENT_AMBULATORY_CARE_PROVIDER_SITE_OTHER): Payer: Medicare Other | Admitting: Nurse Practitioner

## 2013-03-17 ENCOUNTER — Telehealth: Payer: Self-pay

## 2013-03-17 VITALS — BP 130/64 | HR 86 | Ht 60.0 in | Wt 201.0 lb

## 2013-03-17 DIAGNOSIS — Z01818 Encounter for other preprocedural examination: Secondary | ICD-10-CM

## 2013-03-17 MED ORDER — HYDROCODONE-ACETAMINOPHEN 5-325 MG PO TABS: 1.0000 | ORAL_TABLET | Freq: Four times a day (QID) | ORAL | Status: DC | PRN

## 2013-03-17 NOTE — Patient Instructions (Signed)
The only way to clear you for your surgery is to proceed with a heart catheterization.  I will send Dr.Murphy a note.  Let us know if you change your mind.   Call the [facility] office at [phone] if you have any questions, problems or concerns.

## 2013-03-17 NOTE — Telephone Encounter (Signed)
Faxed hardcopy to [facility] [place] and informed the patient.

## 2013-03-17 NOTE — Telephone Encounter (Signed)
Done hardcopy to robin  

## 2013-03-17 NOTE — Progress Notes (Signed)
Holly Weiss Date of Birth: 11-11-1940 Medical Record #[id number]  History of Present Illness: Ms. Blatter is seen back today for a pre op visit. Seen for Dr. Antoine Poche. Has multiple CV risk factors. Last seen here in January of 2013 - had an abnormal Myoview with possible inferior ischemia and mildly reduced EF - cardiac cath was recommended - she never called back to schedule and decided against having.   Has not been seen here since January of 2013. Has seen PCP recently with memory issues and hallucinations - felt to probably have some dementia - referred for MRI and neuro appointment but looks like she cancelled.   Now presents for right total knee replacement with Dr. Eulah Pont.   Comes back today. Here with family members. In a wheelchair. Says her "legs are broken". Says she weighs 130 but really is over 200#. Poor historian with poor insight. Family gives history as well. She wants to have knee replacement - can't remember who she saw for ortho. Denies chest pain. Says her breathing is fine but family notes DOE with minimal activities and sedentary lifestyle. She does not wish to have cardiac cath and does not sound like she remembers her last visit with Dr. Antoine Poche when this was discussed. No recent [facility] noted. Sugars not checked. She cancelled her MRI and neurology appointment.    Current Outpatient Prescriptions  Medication Sig Dispense Refill  . acetaminophen (TYLENOL) 325 MG tablet Take 650 mg by mouth every 6 (six) hours as needed for pain.      Marland Kitchen aspirin 81 MG tablet Take 81 mg by mouth daily.        . B-D ULTRAFINE III SHORT PEN 31G X 8 MM MISC USE TWO TIMES A DAY AS DIRECTED  200 each  3  . BIOTIN PO Take 1 tablet by mouth daily.      . citalopram (CELEXA) 10 MG tablet Take 1 tablet (10 mg total) by mouth daily.  90 tablet  3  . CRESTOR 20 MG tablet TAKE ONE-HALF TABLET DAILY  30 tablet  3  . cyclobenzaprine (FLEXERIL) 10 MG tablet TAKE 1 TABLET BY MOUTH 3 TIMES A DAY AS NEEDED FOR  MUSCLE SPASMS  90 tablet  1  . furosemide (LASIX) 40 MG tablet Take 40 mg by mouth 2 (two) times daily.      Marland Kitchen glucose blood (FREESTYLE LITE) test strip Use as directed       . HYDROcodone-acetaminophen (NORCO/VICODIN) 5-325 MG per tablet Take 1-2 tablets by mouth every 6 (six) hours as needed for pain.  30 tablet  0  . insulin aspart (NOVOLOG) 100 UNIT/ML injection Inject 15 Units into the skin daily. INJECT 5 UNITS UNDER THE SKIN ONCE A DAY      . insulin glargine (LANTUS) 100 UNIT/ML injection Inject 37 Units into the skin daily.      . metFORMIN (GLUCOPHAGE) 500 MG tablet Take 1,000 mg by mouth 2 (two) times daily with a meal.      . Misc Natural Products (OSTEO BI-FLEX JOINT SHIELD PO) Take 1 tablet by mouth daily.      . Multiple Vitamins-Minerals (ONE-A-DAY EXTRAS ANTIOXIDANT PO) Take by mouth daily.        . naproxen sodium (ANAPROX) 220 MG tablet Take 220 mg by mouth 2 (two) times daily as needed. For pain      . OVER THE COUNTER MEDICATION Apply 1 application topically 3 (three) times daily as needed. for pain      .  QVAR 80 MCG/ACT inhaler TAKE 1 PUFF TWICE A DAY  8.7 g  0   No current facility-administered medications for this visit.    No Known Allergies  Past Medical History  Diagnosis Date  . Abdominal pain, epigastric 08/20/2008  . ALLERGIC RHINITIS 01/18/2008  . ANEMIA-NOS 01/18/2008  . 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 ASTHMA, WITH ACUTE EXACERBATION 11/29/2008  . CHEST PAIN 04/09/2009  . CHF 01/18/2008  . Chronic pain syndrome 01/21/2010  . DIABETIC  RETINOPATHY 05/30/2007  . Diarrhea 07/24/2010  . DM, UNCOMPLICATED, TYPE II, UNCONTROLLED 05/30/2007  . DYSPEPSIA 01/21/2010  . DYSPNEA ON EXERTION 01/21/2010  . DYSPNEA 01/18/2008  . Edema 09/09/2010  . FATIGUE 01/21/2010  . 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 HYPERLIPIDEMIA 05/30/2007  . HYPERTENSION 10/31/2007  . JOINT EFFUSION, LEFT KNEE 11/29/2008  . KNEE PAIN, BILATERAL 09/01/2010  . LOW BACK PAIN 01/18/2008  . MORBID OBESITY,  HX OF 05/30/2007  . MYOCARDIAL PERFUSION SCAN, WITH STRESS TEST, ABNORMAL 05/01/2009  . NAUSEA 04/09/2009  . NEPHROLITHIASIS, HX OF 01/18/2008  . SHOULDER PAIN, BILATERAL 04/09/2009  . Migraine headache   . Spinal stenosis   . Bilateral carpal tunnel syndrome   . History of colonoscopy   . Diabetes mellitus     Past Surgical History  Procedure Laterality Date  . Cataract extraction    . Cholecystectomy    . Thoracic fusion    . Lumbar disc surgury    . Tubal ligation      History  Smoking status  . Never Smoker   Smokeless tobacco  . Not on file    History  Alcohol Use No    Family History  Problem Relation Age of Onset  . Diabetes Mother   . Heart disease Mother   . Lung cancer Father   . Heart disease Father   . Diabetes Sister     Review of Systems: The review of systems is per the HPI.  All other systems were reviewed and are negative.  Physical Exam: BP 130/64  Pulse 86  Ht 5' (1.524 m)  Wt 201 lb (91.173 kg)  BMI 39.26 kg/m2 Patient is a morbidly obese black female and in no acute distress. Skin is warm and dry. Color is normal.  HEENT is unremarkable. Normocephalic/atraumatic. PERRL. Sclera are nonicteric. Neck is supple. No masses. No JVD. Lungs are clear. Cardiac exam shows a regular rate and rhythm. Heart tones are distant. Abdomen is obese but soft. Extremities are quite full but without edema. Gait is not tested. No gross neurologic deficits noted but I do not think her memory is that great.  LABORATORY DATA: EKG today shows sinus with nonspecific ST/T wave changes.   Lab Results  Component Value Date   WBC 5.4 02/14/2013   HGB 10.7* 02/14/2013   HCT 32.1* 02/14/2013   PLT 224.0 02/14/2013   GLUCOSE 142* 02/14/2013   CHOL 139 07/21/2011   TRIG 75.0 07/21/2011   HDL 52.10 07/21/2011   LDLCALC 72 07/21/2011   ALT 27 02/14/2013   AST 26 02/14/2013   NA 139 02/14/2013   K 4.2 02/14/2013   CL 108 02/14/2013   CREATININE 1.2 02/14/2013   BUN 27* 02/14/2013     CO2 28 02/14/2013   TSH 1.62 02/14/2013   HGBA1C 8.0* 07/21/2011   MICROALBUR 8.2* 07/21/2011    Myoview Impression  Exercise Capacity: Lexiscan with no exercise.  BP Response: Normal blood pressure response.  Clinical Symptoms: Chest fullness  ECG Impression: No significant ST  segment change suggestive of ischemia.  Comparison with Prior Nuclear Study: No images to compare   EF was 45%  Overall Impression: There is interference from nuclear activity from structures below the diaphragm. There is some decreased activity in the inferior wall. Wall motion analysis suggests some hypokinesis of the inferior wall. By history an older study was felt to have shown some inferior scar. I do not have that exact report available. This study seems to show a similar finding. There is evidence of a small inferior scar. There is no marked ischemia.   Willa Rough, MD   ECHO SUMMARY FROM 2009 - Overall left ventricular systolic function was normal. Left ventricular ejection fraction was estimated to be 60 %. There were no left ventricular regional wall motion abnormalities.  ---------------------------------------------------------------  Prepared and Electronically Authenticated by  Charlton Haws M.D. Confirmed 25-Jan-2008 13:30:37       Assessment / Plan: 1. Pre op clearance - I have talked with Dr. Clifton James here today (DOD). With her abnormal Myoview, abnormal EKG, multiple CV risk factors, and low EF - not going to be able to clear her for knee surgery without cardiac catheterization. I have explained this repeatedly to her. She has poor insight into her health. Her family members here today seem to understand.   2. Abnormal Myoview with low EF - cardiac cath recommended last January of 2013 - patient declined cath in the past and continues to do so.   3. DM - uncontrolled  4. HTN   5. Memory disorder - may be more of an issue than realized.   See back prn.   Patient is agreeable  to this plan and will call if any problems develop in the interim.   Rosalio Macadamia, RN, ANP-C [facility] [address] [place], [place]  [address]

## 2013-03-17 NOTE — Telephone Encounter (Signed)
The patient left a message unable to understand.  Stated she was seen by cardiology today and now would like pain medication refill.  Please advise

## 2013-04-06 ENCOUNTER — Other Ambulatory Visit: Payer: Self-pay | Admitting: Internal Medicine

## 2013-04-06 NOTE — Telephone Encounter (Signed)
Done hardcopy to robin  

## 2013-04-06 NOTE — Telephone Encounter (Signed)
Faxed hardcopy to [facility] 

## 2013-04-20 ENCOUNTER — Telehealth: Payer: Self-pay | Admitting: Cardiology

## 2013-04-20 NOTE — Telephone Encounter (Signed)
New problem    Status of cardiac clearance .  

## 2013-04-20 NOTE — Telephone Encounter (Signed)
Pt was last seen by Norma Fredrickson, NP and was advised she needed a cardiac cath prior to being given cardiac clearance which the pt refused.  She is now calling wanting to have the cath done 9/26.  Advised I will discuss with Dr Antoine Poche and call her back

## 2013-04-23 NOTE — Telephone Encounter (Signed)
The patient would need to be seen back in clinic before scheduling this.  Given the note from August and the mention of her confusion we would need to understand who her HCPA is and that person would need to be present both for the appt and for the cath.  Call Ms. Wieand with the results and send results to Oliver Barre, MD

## 2013-05-16 NOTE — Telephone Encounter (Signed)
appt scheduled for 06/13/2013 with Dr Antoine Poche

## 2013-05-25 ENCOUNTER — Ambulatory Visit (INDEPENDENT_AMBULATORY_CARE_PROVIDER_SITE_OTHER): Payer: Medicare Other | Admitting: Internal Medicine

## 2013-05-25 DIAGNOSIS — Z5320 Procedure and treatment not carried out because of patient's decision for unspecified reasons: Secondary | ICD-10-CM

## 2013-05-25 DIAGNOSIS — Z5329 Procedure and treatment not carried out because of patient's decision for other reasons: Secondary | ICD-10-CM

## 2013-05-25 NOTE — Progress Notes (Signed)
Subjective:    Patient ID: Holly Weiss, female    DOB: August 02, 1941, 72 y.o.   MRN: [id number]  HPI  Family only here without pt;  Has had several severe low blood sugars with maintaining her meds at previous levels and not reducing as recommended;  Family constatntly now trying to feed her to keep her alert.  Pt very resistant to reducing insulin dose, and family unable to get her to do so.  Pt refused to attend appt today, behavior becoming difficult for family to deal with. Past Medical History  Diagnosis Date  . Abdominal pain, epigastric 08/20/2008  . ALLERGIC RHINITIS 01/18/2008  . ANEMIA-NOS 01/18/2008  . 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 ASTHMA, WITH ACUTE EXACERBATION 11/29/2008  . CHEST PAIN 04/09/2009  . CHF 01/18/2008  . Chronic pain syndrome 01/21/2010  . DIABETIC  RETINOPATHY 05/30/2007  . Diarrhea 07/24/2010  . DM, UNCOMPLICATED, TYPE II, UNCONTROLLED 05/30/2007  . DYSPEPSIA 01/21/2010  . DYSPNEA ON EXERTION 01/21/2010  . DYSPNEA 01/18/2008  . Edema 09/09/2010  . FATIGUE 01/21/2010  . 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 HYPERLIPIDEMIA 05/30/2007  . HYPERTENSION 10/31/2007  . JOINT EFFUSION, LEFT KNEE 11/29/2008  . KNEE PAIN, BILATERAL 09/01/2010  . LOW BACK PAIN 01/18/2008  . MORBID OBESITY, HX OF 05/30/2007  . MYOCARDIAL PERFUSION SCAN, WITH STRESS TEST, ABNORMAL 05/01/2009  . NAUSEA 04/09/2009  . NEPHROLITHIASIS, HX OF 01/18/2008  . SHOULDER PAIN, BILATERAL 04/09/2009  . Migraine headache   . Spinal stenosis   . Bilateral carpal tunnel syndrome   . History of colonoscopy   . Diabetes mellitus    Past Surgical History  Procedure Laterality Date  . Cataract extraction    . Cholecystectomy    . Thoracic fusion    . Lumbar disc surgury    . Tubal ligation      reports that she has never smoked. She does not have any smokeless tobacco history on file. She reports that she does not drink alcohol or use illicit drugs. family history includes Diabetes in her mother and sister; Heart disease  in her father and mother; Lung cancer in her father. No Known Allergies Current Outpatient Prescriptions on File Prior to Visit  Medication Sig Dispense Refill  . acetaminophen (TYLENOL) 325 MG tablet Take 650 mg by mouth every 6 (six) hours as needed for pain.      Marland Kitchen aspirin 81 MG tablet Take 81 mg by mouth daily.        . B-D ULTRAFINE III SHORT PEN 31G X 8 MM MISC USE TWO TIMES A DAY AS DIRECTED  200 each  3  . BIOTIN PO Take 1 tablet by mouth daily.      . citalopram (CELEXA) 10 MG tablet Take 1 tablet (10 mg total) by mouth daily.  90 tablet  3  . CRESTOR 20 MG tablet TAKE ONE-HALF TABLET DAILY  30 tablet  3  . cyclobenzaprine (FLEXERIL) 10 MG tablet TAKE 1 TABLET BY MOUTH 3 TIMES A DAY AS NEEDED FOR MUSCLE SPASMS  90 tablet  1  . furosemide (LASIX) 40 MG tablet Take 40 mg by mouth 2 (two) times daily.      Marland Kitchen glucose blood (FREESTYLE LITE) test strip Use as directed       . HYDROcodone-acetaminophen (NORCO/VICODIN) 5-325 MG per tablet TAKE 1 TABLET BY MOUTH EVERY 6 HOURS AS NEEDED FOR PAIN  40 tablet  1  . insulin aspart (NOVOLOG) 100 UNIT/ML injection Inject 15 Units into the skin daily.  INJECT 5 UNITS UNDER THE SKIN ONCE A DAY      . insulin glargine (LANTUS) 100 UNIT/ML injection Inject 37 Units into the skin daily.      . Misc Natural Products (OSTEO BI-FLEX JOINT SHIELD PO) Take 1 tablet by mouth daily.      . Multiple Vitamins-Minerals (ONE-A-DAY EXTRAS ANTIOXIDANT PO) Take by mouth daily.        . naproxen sodium (ANAPROX) 220 MG tablet Take 220 mg by mouth 2 (two) times daily as needed. For pain      . OVER THE COUNTER MEDICATION Apply 1 application topically 3 (three) times daily as needed. for pain      . QVAR 80 MCG/ACT inhaler TAKE 1 PUFF TWICE A DAY  8.7 g  0   No current facility-administered medications on file prior to visit.   Review of Systems Not done, pt not here    Objective:   Physical Exam Not done, pt not here     Assessment & Plan:

## 2013-05-25 NOTE — Patient Instructions (Signed)
Please remove the metformin from her medications at home  We will call the pharmacy to stop the metformin  Please continue all other medications as before, and refills have been done if requested.  Please call if any further low sugars  Please return in 6 wks, or sooner if needed

## 2013-05-28 DIAGNOSIS — Z5329 Procedure and treatment not carried out because of patient's decision for other reasons: Secondary | ICD-10-CM | POA: Insufficient documentation

## 2013-05-28 NOTE — Assessment & Plan Note (Signed)
To d/c metformin, a family member who helps pt with her meds will remove the bottle, and pharmacy to be notified of no further refills.  Family to call for any persistent low sugars,

## 2013-05-28 NOTE — Assessment & Plan Note (Addendum)
Pt behavior becoming difficult per family, may be becoming a danger to herself, now refusing to leave the home, if behavior worsens I have asked family to cal 911 for ER eval including psychiatric  Note:  Total time for pt hx, exam, review of record, determination of diagnoses and plan for further eval and tx is > 30 min, with over 50% spent in coordination and counseling of patient family

## 2013-06-02 ENCOUNTER — Other Ambulatory Visit: Payer: Self-pay

## 2013-06-02 MED ORDER — HYDROCODONE-ACETAMINOPHEN 5-325 MG PO TABS: ORAL_TABLET | ORAL | Status: DC

## 2013-06-02 NOTE — Telephone Encounter (Signed)
Called the patient left a detailed message that prescriptions are ready for pickup at the front desk. 

## 2013-06-02 NOTE — Telephone Encounter (Signed)
Done hardcopy to robin  Note to pt:  You are given the letter today explaining the transitional pain medication refill policy  Please be aware that I will no longer be able to offer monthly refills of any Schedule II or higher medication starting Sep 03, 2013

## 2013-06-02 NOTE — Telephone Encounter (Signed)
Patient informed. 

## 2013-06-13 ENCOUNTER — Ambulatory Visit (INDEPENDENT_AMBULATORY_CARE_PROVIDER_SITE_OTHER): Payer: Medicare Other | Admitting: Cardiology

## 2013-06-13 ENCOUNTER — Encounter: Payer: Self-pay | Admitting: Cardiology

## 2013-06-13 VITALS — BP 140/64 | HR 69 | Ht 60.0 in | Wt 206.0 lb

## 2013-06-13 DIAGNOSIS — I255 Ischemic cardiomyopathy: Secondary | ICD-10-CM

## 2013-06-13 DIAGNOSIS — I10 Essential (primary) hypertension: Secondary | ICD-10-CM

## 2013-06-13 DIAGNOSIS — I2589 Other forms of chronic ischemic heart disease: Secondary | ICD-10-CM

## 2013-06-13 NOTE — Patient Instructions (Addendum)
Your physician wants you to follow-up in: DEPENDING ON RESULTS You will receive a reminder letter in the mail two months in advance. If you don't receive a letter, please call our office to schedule the follow-up appointment.  Your physician has requested that you have a dobutamine echocardiogram. Please follow instruction sheet as given.  Your physician has recommended you make the following change in your medication:   HOLD YOUR MORNING DOSE OF YOUR LANTUS  HOLD YOUR LASIX THE MORNING OF PROCEDURE  Your physician recommends that you continue on your current medications as directed. Please refer to the Current Medication list given to you today.

## 2013-06-13 NOTE — Progress Notes (Signed)
HPI The patient presents for evaluation of dyspnea and multiple cardiovascular risk factors and preop clearance. She had an abnormal stress perfusion study suggesting some possible ischemia in the inferior wall with a mildly reduced ejection fraction. Prior to that an echo had been normal. I suggested cardiac catheterization. She went home to talk about this and never called back to schedule that study. She came back and saw me in January of last year and a followup nuclear study demonstrated a mildly reduced ejection fraction with a predominantly fixed inferior defect which was essentially unchanged. We decided to manage her medically. She is now back for preoperative clearance wanting to have knee replacements. She gets around very slowly because of this. She uses a cane and walker. With this she denies any cardiovascular symptoms. She said she used to be more short of breath but probably isn't now because of decreased activity.  The patient denies any new symptoms such as chest discomfort, neck or arm discomfort. There has been no new shortness of breath, PND or orthopnea. There have been no reported palpitations, presyncope or syncope.  No Known Allergies  Current Outpatient Prescriptions  Medication Sig Dispense Refill  . aspirin 81 MG tablet Take 81 mg by mouth daily.        Marland Kitchen BIOTIN PO Take 1 tablet by mouth daily.      . citalopram (CELEXA) 10 MG tablet Take 1 tablet (10 mg total) by mouth daily.  90 tablet  3  . CRESTOR 20 MG tablet TAKE ONE-HALF TABLET DAILY  30 tablet  3  . furosemide (LASIX) 40 MG tablet Take 40 mg by mouth 2 (two) times daily.      Marland Kitchen HYDROcodone-acetaminophen (NORCO/VICODIN) 5-325 MG per tablet TAKE 1 TABLET BY MOUTH EVERY 6 HOURS AS NEEDED FOR PAIN  60 tablet  0  . insulin aspart (NOVOLOG) 100 UNIT/ML injection Inject 15 Units into the skin once.       . insulin glargine (LANTUS) 100 UNIT/ML injection Inject 37 Units into the skin daily.      . Misc Natural Products  (OSTEO BI-FLEX JOINT SHIELD PO) Take 1 tablet by mouth daily.      . Multiple Vitamins-Minerals (ONE-A-DAY EXTRAS ANTIOXIDANT PO) Take by mouth daily.        . naproxen sodium (ANAPROX) 220 MG tablet Take 220 mg by mouth 2 (two) times daily as needed. For pain      . OVER THE COUNTER MEDICATION Apply 1 application topically 3 (three) times daily as needed. for pain      . QVAR 80 MCG/ACT inhaler TAKE 1 PUFF TWICE A DAY  8.7 g  0   No current facility-administered medications for this visit.    Past Medical History  Diagnosis Date  . ANEMIA-NOS 01/18/2008  . 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 ASTHMA, WITH ACUTE EXACERBATION 11/29/2008  . Chronic pain syndrome 01/21/2010  . DIABETIC  RETINOPATHY 05/30/2007  . DM, UNCOMPLICATED, TYPE II, UNCONTROLLED 05/30/2007  . 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 HYPERLIPIDEMIA 05/30/2007  . HYPERTENSION 10/31/2007  . LOW BACK PAIN 01/18/2008  . MORBID OBESITY, HX OF 05/30/2007  . NEPHROLITHIASIS, HX OF 01/18/2008  . SHOULDER PAIN, BILATERAL 04/09/2009  . Migraine headache   . Spinal stenosis   . Bilateral carpal tunnel syndrome     Past Surgical History  Procedure Laterality Date  . Cataract extraction    . Cholecystectomy    . Thoracic fusion    . Lumbar disc surgury    .  Tubal ligation      ROS:  As stated in the HPI and negative for all other systems.  PHYSICAL EXAM BP 140/64  Pulse 69  Ht 5' (1.524 m)  Wt 206 lb (93.441 kg)  BMI 40.23 kg/m2 GEN:  No distress NECK:  No jugular venous distention at 90 degrees, waveform within normal limits, carotid upstroke brisk and symmetric, no bruits, no thyromegaly LYMPHATICS:  No cervical adenopathy LUNGS:  Clear to auscultation bilaterally BACK:  No CVA tenderness CHEST:  Unremarkable HEART:  S1 and S2 within normal limits, no S3, no S4, no clicks, no rubs, no  murmurs ABD:  Positive bowel sounds normal in frequency in pitch, no bruits, no rebound, no guarding, unable to assess midline mass or bruit  with the patient seated. EXT:  2 plus pulses throughout, moderate edema, no cyanosis no clubbing SKIN:  No rashes no nodules NEURO:  Cranial nerves II through XII grossly intact, motor grossly intact throughout PSYCH:  Cognitively intact, oriented to person place and time  EKG:  Sinus rhythm, rate 69, axis within normal limits, intervals within normal limits, LVH voltage criteria, inferolateral T wave inversion slightly more pronounced than previous. 2013/06/26  ASSESSMENT AND PLAN  PREOP:  The patient does need preoperative screening. I think I would believe a dobutamine echocardiogram this was low risk. I suspect her ejection fraction might be underestimated on Lexiscan Myoview.  I did however stress test again is abnormal she would need cardiac catheterization prior to knee surgery.  HTN:  The blood pressure is at target. No change in medications is indicated. We will continue with therapeutic lifestyle changes (TLC).

## 2013-06-28 ENCOUNTER — Telehealth: Payer: Self-pay | Admitting: Cardiology

## 2013-06-28 ENCOUNTER — Other Ambulatory Visit: Payer: Self-pay | Admitting: Physician Assistant

## 2013-06-28 NOTE — Telephone Encounter (Signed)
Lindsay aware.

## 2013-06-28 NOTE — Telephone Encounter (Signed)
New problem   Has questions about the pt's cardiac clearance. Please call Mardella Layman

## 2013-06-28 NOTE — Telephone Encounter (Signed)
Pt was seen 11/11 for cardiac clearance and was ordered to have a Dob Echo before clearance can be given.  This test is scheduled for 12/3.

## 2013-07-03 ENCOUNTER — Other Ambulatory Visit: Payer: Self-pay | Admitting: Physician Assistant

## 2013-07-03 ENCOUNTER — Encounter: Payer: Self-pay | Admitting: Physician Assistant

## 2013-07-03 DIAGNOSIS — M1711 Unilateral primary osteoarthritis, right knee: Secondary | ICD-10-CM | POA: Insufficient documentation

## 2013-07-03 NOTE — H&P (Signed)
TOTAL KNEE ADMISSION H&P  Patient is being admitted for right total knee arthroplasty.  Subjective:  Chief Complaint:right knee pain.  HPI: Holly Weiss, 72 y.o. female, has a history of pain and functional disability in the right knee due to arthritis and has failed non-surgical conservative treatments for greater than 12 weeks to includeNSAID's and/or analgesics, corticosteriod injections, flexibility and strengthening excercises and use of assistive devices.  Onset of symptoms was gradual, starting >10 years ago with gradually worsening course since that time. The patient noted no past surgery on the right knee(s).  Patient currently rates pain in the right knee(s) at 7 out of 10 with activity. Patient has night pain, worsening of pain with activity and weight bearing, pain that interferes with activities of daily living, pain with passive range of motion and crepitus.  Patient has evidence of periarticular osteophytes and joint space narrowing by imaging studies. There is no active infection.  Patient Active Problem List   Diagnosis Date Noted  . 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 Failure to attend appointment 05/28/2013  . Memory dysfunction 02/14/2013  . Depression 02/14/2013  . Visual hallucinations 02/14/2013  . Pain in joint of right knee 02/14/2013  . Type II or unspecified type diabetes mellitus without mention of complication, uncontrolled   . Neck pain, acute 07/21/2011  . Right knee pain 07/21/2011  . Preventative health care 01/27/2011  . Edema 09/09/2010  . KNEE PAIN, BILATERAL 09/01/2010  . 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 Chronic pain syndrome 01/21/2010  . DYSPEPSIA 01/21/2010  . FATIGUE 01/21/2010  . DYSPNEA ON EXERTION 01/21/2010  . MYOCARDIAL PERFUSION SCAN, WITH STRESS TEST, ABNORMAL 05/01/2009  . SHOULDER PAIN, BILATERAL 04/09/2009  . CHEST PAIN 04/09/2009  . NAUSEA 04/09/2009  . JOINT EFFUSION, LEFT KNEE 11/29/2008  . Abdominal pain, epigastric 08/20/2008  .  ANEMIA-NOS 01/18/2008  . CHF 01/18/2008  . ALLERGIC RHINITIS 01/18/2008  . LOW BACK PAIN 01/18/2008  . NEPHROLITHIASIS, HX OF 01/18/2008  . HYPERTENSION 10/31/2007  . DIABETIC  RETINOPATHY 05/30/2007  . HYPERLIPIDEMIA 05/30/2007  . MIGRAINE HEADACHE 05/30/2007  . 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 ASTHMA 05/30/2007  . MORBID OBESITY, HX OF 05/30/2007   Past Medical History  Diagnosis Date  . ANEMIA-NOS 01/18/2008  . 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 ASTHMA, WITH ACUTE EXACERBATION 11/29/2008  . Chronic pain syndrome 01/21/2010  . DIABETIC  RETINOPATHY 05/30/2007  . DM, UNCOMPLICATED, TYPE II, UNCONTROLLED 05/30/2007  . 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 HYPERLIPIDEMIA 05/30/2007  . HYPERTENSION 10/31/2007  . LOW BACK PAIN 01/18/2008  . MORBID OBESITY, HX OF 05/30/2007  . NEPHROLITHIASIS, HX OF 01/18/2008  . SHOULDER PAIN, BILATERAL 04/09/2009  . Migraine headache   . Spinal stenosis   . Bilateral carpal tunnel syndrome     Past Surgical History  Procedure Laterality Date  . Cataract extraction    . Cholecystectomy    . Thoracic fusion    . Lumbar disc surgury    . Tubal ligation       (Not in a hospital admission) No Known Allergies  History  Substance Use Topics  . Smoking status: Never Smoker   . Smokeless tobacco: Not on file  . Alcohol Use: No    Family History  Problem Relation Age of Onset  . Diabetes Mother   . Heart disease Mother   . Lung cancer Father   . Heart disease Father   . Diabetes Sister      Review of Systems  Constitutional: Negative.  HENT: Negative.   Eyes: Negative.   Respiratory: Negative.   Cardiovascular: Negative.   Gastrointestinal: Negative.   Genitourinary: Negative.   Musculoskeletal: Positive for joint pain.  Skin: Negative.   Neurological: Negative.   Endo/Heme/Allergies: Negative.   Psychiatric/Behavioral: Negative.     Objective:  Physical Exam  Constitutional: She is oriented to person, place, and time. She appears  well-developed and well-nourished.  HENT:  Head: Normocephalic and atraumatic.  Eyes: EOM are normal. Pupils are equal, round, and reactive to light.  Neck: Neck supple.  Cardiovascular: Normal rate, regular rhythm and normal heart sounds.   Respiratory: Effort normal and breath sounds normal.  GI: Soft. Bowel sounds are normal.  Musculoskeletal:  Marked antalgic gait with a windswept deformity. Increased varus on the right.  Negative straight leg raise both sides negative log roll both hips. She is neurovascularly intact distally. The right knee has motion 5-90. 5 degrees of varus only partially correctable. Tibiofemoral and patellofemoral crepitus.   Neurological: She is alert and oriented to person, place, and time.  Skin: Skin is warm and dry.  Psychiatric: She has a normal mood and affect.    Vital signs in last 24 hours: @VSRANGES @  Labs:   Estimated body mass index is 40.23 kg/(m^2) as calculated from the following:   Height as of 06/13/13: 5' (1.524 m).   Weight as of 06/13/13: 93.441 kg (206 lb).   Imaging Review Plain radiographs demonstrate severe degenerative joint disease of the right knee(s). The overall alignment ismild varus. The bone quality appears to be fair for age and reported activity level.  Assessment/Plan:  End stage arthritis, right knee   The patient history, physical examination, clinical judgment of the provider and imaging studies are consistent with end stage degenerative joint disease of the right knee(s) and total knee arthroplasty is deemed medically necessary. The treatment options including medical management, injection therapy arthroscopy and arthroplasty were discussed at length. The risks and benefits of total knee arthroplasty were presented and reviewed. The risks due to aseptic loosening, infection, stiffness, patella tracking problems, thromboembolic complications and other imponderables were discussed. The patient acknowledged the  explanation, agreed to proceed with the plan and consent was signed. Patient is being admitted for inpatient treatment for surgery, pain control, PT, OT, prophylactic antibiotics, VTE prophylaxis, progressive ambulation and ADL's and discharge planning. The patient is planning to be discharged home with home health services

## 2013-07-05 ENCOUNTER — Other Ambulatory Visit (HOSPITAL_COMMUNITY): Payer: Medicare Other

## 2013-07-05 ENCOUNTER — Encounter: Payer: Self-pay | Admitting: Cardiology

## 2013-07-05 ENCOUNTER — Ambulatory Visit (HOSPITAL_COMMUNITY): Payer: Medicare Other | Attending: Cardiology

## 2013-07-05 DIAGNOSIS — R0602 Shortness of breath: Secondary | ICD-10-CM

## 2013-07-05 DIAGNOSIS — I2589 Other forms of chronic ischemic heart disease: Secondary | ICD-10-CM

## 2013-07-05 DIAGNOSIS — I255 Ischemic cardiomyopathy: Secondary | ICD-10-CM

## 2013-07-05 NOTE — Progress Notes (Signed)
Dobutamine stress echo performed. 

## 2013-07-07 ENCOUNTER — Encounter (HOSPITAL_COMMUNITY): Payer: Self-pay

## 2013-07-07 ENCOUNTER — Encounter (HOSPITAL_COMMUNITY)
Admission: RE | Admit: 2013-07-07 | Discharge: 2013-07-07 | Disposition: A | Discharge status: Home / Self Care | Payer: Medicare Other | Source: Ambulatory Visit | Attending: Orthopedic Surgery | Admitting: Orthopedic Surgery

## 2013-07-07 ENCOUNTER — Ambulatory Visit (HOSPITAL_COMMUNITY)
Admission: RE | Admit: 2013-07-07 | Discharge: 2013-07-07 | Disposition: A | Discharge status: Home / Self Care | Payer: Medicare Other | Source: Ambulatory Visit | Attending: Physician Assistant | Admitting: Physician Assistant

## 2013-07-07 DIAGNOSIS — J9819 Other pulmonary collapse: Secondary | ICD-10-CM | POA: Insufficient documentation

## 2013-07-07 DIAGNOSIS — Z01812 Encounter for preprocedural laboratory examination: Secondary | ICD-10-CM | POA: Insufficient documentation

## 2013-07-07 DIAGNOSIS — I517 Cardiomegaly: Secondary | ICD-10-CM | POA: Insufficient documentation

## 2013-07-07 DIAGNOSIS — Z01818 Encounter for other preprocedural examination: Secondary | ICD-10-CM | POA: Insufficient documentation

## 2013-07-07 HISTORY — DX: Unspecified osteoarthritis, unspecified site: M19.90

## 2013-07-07 HISTORY — DX: Paresthesia of skin: R20.2

## 2013-07-07 HISTORY — DX: Anesthesia of skin: R20.0

## 2013-07-07 HISTORY — DX: Localized edema: R60.0

## 2013-07-07 HISTORY — DX: Urgency of urination: R39.15

## 2013-07-07 LAB — URINALYSIS, ROUTINE W REFLEX MICROSCOPIC
Bilirubin Urine: NEGATIVE
Glucose, UA: NEGATIVE mg/dL
Hgb urine dipstick: NEGATIVE
Leukocytes, UA: NEGATIVE
Nitrite: NEGATIVE
Protein, ur: NEGATIVE mg/dL
Urobilinogen, UA: 0.2 mg/dL (ref 0.0–1.0)
pH: 7.5 (ref 5.0–8.0)

## 2013-07-07 LAB — COMPREHENSIVE METABOLIC PANEL
BUN: 26 mg/dL — ABNORMAL HIGH (ref 6–23)
Calcium: 10.1 mg/dL (ref 8.4–10.5)
Creatinine, Ser: 1.08 mg/dL (ref 0.50–1.10)
GFR calc Af Amer: 58 mL/min — ABNORMAL LOW (ref >90)
GFR calc non Af Amer: 50 mL/min — ABNORMAL LOW (ref >90)
Glucose, Bld: 82 mg/dL (ref 70–99)
Sodium: 141 mEq/L (ref 135–145)
Total Protein: 7.4 g/dL (ref 6.0–8.3)

## 2013-07-07 LAB — PROTIME-INR
INR: 1.08 (ref 0.00–1.49)
Prothrombin Time: 13.8 seconds (ref 11.6–15.2)

## 2013-07-07 LAB — CBC WITH DIFFERENTIAL/PLATELET
Basophils Relative: 1 % (ref 0–1)
Eosinophils Absolute: 0.4 10*3/uL (ref 0.0–0.7)
Eosinophils Relative: 7 % — ABNORMAL HIGH (ref 0–5)
HCT: 33.7 % — ABNORMAL LOW (ref 36.0–46.0)
Lymphs Abs: 2 10*3/uL (ref 0.7–4.0)
MCH: 30.4 pg (ref 26.0–34.0)
MCHC: 33.2 g/dL (ref 30.0–36.0)
MCV: 91.3 fL (ref 78.0–100.0)
Monocytes Absolute: 0.5 10*3/uL (ref 0.1–1.0)
Monocytes Relative: 9 % (ref 3–12)
Platelets: 211 10*3/uL (ref 150–400)
RBC: 3.69 MIL/uL — ABNORMAL LOW (ref 3.87–5.11)
RDW: 14.7 % (ref 11.5–15.5)

## 2013-07-07 LAB — SURGICAL PCR SCREEN: MRSA, PCR: NEGATIVE

## 2013-07-07 LAB — TYPE AND SCREEN
ABO/RH(D): A POS
Antibody Screen: NEGATIVE

## 2013-07-07 LAB — ABO/RH: ABO/RH(D): A POS

## 2013-07-07 NOTE — Progress Notes (Signed)
07/07/13 1048  OBSTRUCTIVE SLEEP APNEA  Score 4 or greater  Results sent to PCP   Thi patient has screened at risk for sleep apnea using the STOP Bang tool during a pre-surgical visit. A score of 4 or greater is at risk for sleep apnea.

## 2013-07-09 ENCOUNTER — Other Ambulatory Visit: Payer: Self-pay | Admitting: Physician Assistant

## 2013-07-09 NOTE — H&P (Signed)
TOTAL KNEE ADMISSION H&P  Patient is being admitted for right total knee arthroplasty.  Subjective:  Chief Complaint:right knee pain.  HPI: Holly Weiss, 72 y.o. female, has a history of pain and functional disability in the right knee due to arthritis and has failed non-surgical conservative treatments for greater than 12 weeks to includeNSAID's and/or analgesics, corticosteriod injections, flexibility and strengthening excercises, use of assistive devices and activity modification.  Onset of symptoms was gradual, starting >10 years ago with gradually worsening course since that time. The patient noted no past surgery on the right knee(s).  Patient currently rates pain in the right knee(s) at 10 out of 10 with activity. Patient has night pain, worsening of pain with activity and weight bearing, pain that interferes with activities of daily living, pain with passive range of motion, crepitus and joint swelling.  Patient has evidence of periarticular osteophytes and joint space narrowing by imaging studies. There is no active infection.  Patient Active Problem List   Diagnosis Date Noted  . 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 Failure to attend appointment 05/28/2013  . Memory dysfunction 02/14/2013  . Depression 02/14/2013  . Visual hallucinations 02/14/2013  . Pain in joint of right knee 02/14/2013  . Type II or unspecified type diabetes mellitus without mention of complication, uncontrolled   . Neck pain, acute 07/21/2011  . Right knee pain 07/21/2011  . Preventative health care 01/27/2011  . Edema 09/09/2010  . KNEE PAIN, BILATERAL 09/01/2010  . 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 Chronic pain syndrome 01/21/2010  . DYSPEPSIA 01/21/2010  . FATIGUE 01/21/2010  . DYSPNEA ON EXERTION 01/21/2010  . MYOCARDIAL PERFUSION SCAN, WITH STRESS TEST, ABNORMAL 05/01/2009  . SHOULDER PAIN, BILATERAL 04/09/2009  . CHEST PAIN 04/09/2009  . NAUSEA 04/09/2009  . JOINT EFFUSION, LEFT KNEE 11/29/2008  .  Abdominal pain, epigastric 08/20/2008  . ANEMIA-NOS 01/18/2008  . CHF 01/18/2008  . ALLERGIC RHINITIS 01/18/2008  . LOW BACK PAIN 01/18/2008  . NEPHROLITHIASIS, HX OF 01/18/2008  . HYPERTENSION 10/31/2007  . DIABETIC  RETINOPATHY 05/30/2007  . HYPERLIPIDEMIA 05/30/2007  . MIGRAINE HEADACHE 05/30/2007  . 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 ASTHMA 05/30/2007  . MORBID OBESITY, HX OF 05/30/2007   Past Medical History  Diagnosis Date  . ANEMIA-NOS 01/18/2008  . 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 ASTHMA, WITH ACUTE EXACERBATION 11/29/2008  . Chronic pain syndrome 01/21/2010  . DIABETIC  RETINOPATHY 05/30/2007  . DM, UNCOMPLICATED, TYPE II, UNCONTROLLED 05/30/2007  . 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 HYPERLIPIDEMIA 05/30/2007  . HYPERTENSION 10/31/2007  . LOW BACK PAIN 01/18/2008  . MORBID OBESITY, HX OF 05/30/2007  . NEPHROLITHIASIS, HX OF 01/18/2008  . SHOULDER PAIN, BILATERAL 04/09/2009  . Migraine headache   . Spinal stenosis   . Bilateral carpal tunnel syndrome   . Shortness of breath     with walking  . Urgency of urination   . Arthritis   . Edema of both legs     takes Lasix  . Numbness and tingling in hands     Past Surgical History  Procedure Laterality Date  . Cataract extraction    . Cholecystectomy    . Thoracic fusion    . Lumbar disc surgury    . Tubal ligation    . Back surgery       (Not in a hospital admission) No Known Allergies  History  Substance Use Topics  . Smoking status: Never Smoker   . Smokeless tobacco: Not on file  . Alcohol Use: No      Family History  Problem Relation Age of Onset  . Diabetes Mother   . Heart disease Mother   . Lung cancer Father   . Heart disease Father   . Diabetes Sister      Review of Systems  Constitutional: Negative.   HENT: Negative.   Eyes: Negative.   Respiratory: Negative.   Cardiovascular: Negative.   Gastrointestinal: Negative.   Genitourinary: Negative.   Musculoskeletal: Positive for joint pain.  Skin:  Negative.   Neurological: Negative.   Endo/Heme/Allergies: Negative.   Psychiatric/Behavioral: Negative.     Objective:  Physical Exam  Constitutional: She is oriented to person, place, and time. She appears well-developed and well-nourished.  HENT:  Head: Normocephalic and atraumatic.  Eyes: EOM are normal. Pupils are equal, round, and reactive to light.  Neck: Neck supple.  Cardiovascular: Normal rate and regular rhythm.   Respiratory: Effort normal and breath sounds normal. No respiratory distress. She has no wheezes. She has no rales.  GI: Soft. Bowel sounds are normal. She exhibits no distension. There is no tenderness.  Musculoskeletal:  Exam of her right knee reveals increased varus on the right.  She has motion about 5 degrees of extension to about 90 degrees of flexion.  5 degrees of varus is only partially correctable.  Tibiofemoral and patellofemoral crepitus noted.  She is neurovascularly intact in bilateral lower extremities.    Neurological: She is alert and oriented to person, place, and time.  Skin: Skin is warm and dry.  Psychiatric: She has a normal mood and affect.    Vital signs in last 24 hours: @VSRANGES@  Labs:   Estimated body mass index is 38.97 kg/(m^2) as calculated from the following:   Height as of 07/07/13: 5' (1.524 m).   Weight as of 07/07/13: 90.521 kg (199 lb 9 oz).   Imaging Review Plain radiographs demonstrate severe degenerative joint disease of the right knee(s). The overall alignment ismild varus. The bone quality appears to be fair for age and reported activity level.  Assessment/Plan:  End stage arthritis, right knee   The patient history, physical examination, clinical judgment of the provider and imaging studies are consistent with end stage degenerative joint disease of the right knee(s) and total knee arthroplasty is deemed medically necessary. The treatment options including medical management, injection therapy arthroscopy and  arthroplasty were discussed at length. The risks and benefits of total knee arthroplasty were presented and reviewed. The risks due to aseptic loosening, infection, stiffness, patella tracking problems, thromboembolic complications and other imponderables were discussed. The patient acknowledged the explanation, agreed to proceed with the plan and consent was signed. Patient is being admitted for inpatient treatment for surgery, pain control, PT, OT, prophylactic antibiotics, VTE prophylaxis, progressive ambulation and ADL's and discharge planning. The patient is planning to be discharged home with home health services   

## 2013-07-10 NOTE — Progress Notes (Signed)
Anesthesia chart review:  Patient is a 72 year old female scheduled for right TKA on 07/12/13 by Dr. Eulah Pont.  History includes non-smoker, obesity, SOB, HTN, DM2, HLD, glaucoma, anemia, arthritis, asthma, chronic pain, migraines, spinal stenosis with history of prior back surgery, nephrolithiasis.  OSA screening score was 4 or greater.  PCP is Dr. Oliver Barre.    Patient was referred to cardiologist Dr. Antoine Poche for a preoperative evaluation as she has had a history of a prior mildly abnormal Myoview in 09/2011 in which cath was recommended, but patient never called back to schedule.  At her 11/11/4 visit, he ordered a Dobutamine stress echo which was submaximal.  Patient did not want a cardiac cath.  Dr Antoine Poche felt "...she would be at moderate risk for this surgery from a cardiovascular standpoint and I could not further quantify this risk without cath. I do not however, think that the risk is prohibitive and I would suggest that this be discussed with the the patient (risk benefit) at a follow up appt with her orthopedist."  Stress echo on 07/05/13 showed baseline LVEF 55%, up to 65% with peak exercise. Normal wall motion, no LV regional wall motion abnormalities.  EKG on 06/13/13 showed NSR, inferolateral T wave abnormality, consider ischemia.  CXR on 07/07/13 showed: 1. Persistent findings of cardiomegaly and pulmonary venous congestion without frank evidence of edema.  2. Improved aeration of the lungs with persistent right basilar opacities favored to represent atelectasis.  Preoperative labs noted.  UA was WNL.  I do not see that the urine culture was done, so it will need to be done on the day of surgery if still desired by the surgeon.  Velna Ochs [facility] Stay [facility]/Anesthesiology Phone [phone] 07/10/2013 10:34 AM

## 2013-07-11 MED ORDER — CLINDAMYCIN PHOSPHATE 900 MG/50ML IV SOLN
900.0000 mg | INTRAVENOUS | Status: AC
  Administered 2013-07-12: 900 mg via INTRAVENOUS
  Filled 2013-07-11: qty 50

## 2013-07-12 ENCOUNTER — Encounter (HOSPITAL_COMMUNITY)
Admission: RE | Disposition: A | Discharge status: Skilled Nursing Facility | Payer: Self-pay | Source: Ambulatory Visit | Attending: Orthopedic Surgery

## 2013-07-12 ENCOUNTER — Inpatient Hospital Stay (HOSPITAL_COMMUNITY)
Admission: RE | Admit: 2013-07-12 | Discharge: 2013-07-15 | DRG: 470 | Disposition: A | Discharge status: Skilled Nursing Facility | Payer: Medicare Other | Source: Ambulatory Visit | Attending: Orthopedic Surgery | Admitting: Orthopedic Surgery

## 2013-07-12 ENCOUNTER — Inpatient Hospital Stay (HOSPITAL_COMMUNITY): Payer: Medicare Other | Admitting: Anesthesiology

## 2013-07-12 ENCOUNTER — Inpatient Hospital Stay (HOSPITAL_COMMUNITY): Payer: Medicare Other

## 2013-07-12 ENCOUNTER — Encounter (HOSPITAL_COMMUNITY): Payer: Self-pay | Admitting: *Deleted

## 2013-07-12 ENCOUNTER — Encounter (HOSPITAL_COMMUNITY): Payer: Medicare Other | Admitting: Vascular Surgery

## 2013-07-12 DIAGNOSIS — G894 Chronic pain syndrome: Secondary | ICD-10-CM | POA: Diagnosis present

## 2013-07-12 DIAGNOSIS — Z8249 Family history of ischemic heart disease and other diseases of the circulatory system: Secondary | ICD-10-CM

## 2013-07-12 DIAGNOSIS — G5600 Carpal tunnel syndrome, unspecified upper limb: Secondary | ICD-10-CM | POA: Diagnosis present

## 2013-07-12 DIAGNOSIS — M1710 Unilateral primary osteoarthritis, unspecified knee: Principal | ICD-10-CM | POA: Diagnosis present

## 2013-07-12 DIAGNOSIS — E785 Hyperlipidemia, unspecified: Secondary | ICD-10-CM | POA: Diagnosis present

## 2013-07-12 DIAGNOSIS — Z794 Long term (current) use of insulin: Secondary | ICD-10-CM

## 2013-07-12 DIAGNOSIS — I10 Essential (primary) hypertension: Secondary | ICD-10-CM | POA: Diagnosis present

## 2013-07-12 DIAGNOSIS — Z801 Family history of malignant neoplasm of trachea, bronchus and lung: Secondary | ICD-10-CM

## 2013-07-12 DIAGNOSIS — E1139 Type 2 diabetes mellitus with other diabetic ophthalmic complication: Secondary | ICD-10-CM | POA: Diagnosis present

## 2013-07-12 DIAGNOSIS — H409 Unspecified glaucoma: Secondary | ICD-10-CM | POA: Diagnosis present

## 2013-07-12 DIAGNOSIS — Z6838 Body mass index (BMI) 38.0-38.9, adult: Secondary | ICD-10-CM

## 2013-07-12 DIAGNOSIS — E11319 Type 2 diabetes mellitus with unspecified diabetic retinopathy without macular edema: Secondary | ICD-10-CM | POA: Diagnosis present

## 2013-07-12 DIAGNOSIS — Z7982 Long term (current) use of aspirin: Secondary | ICD-10-CM

## 2013-07-12 DIAGNOSIS — Z79899 Other long term (current) drug therapy: Secondary | ICD-10-CM

## 2013-07-12 DIAGNOSIS — Z87442 Personal history of urinary calculi: Secondary | ICD-10-CM

## 2013-07-12 DIAGNOSIS — Z9089 Acquired absence of other organs: Secondary | ICD-10-CM

## 2013-07-12 DIAGNOSIS — Z981 Arthrodesis status: Secondary | ICD-10-CM

## 2013-07-12 DIAGNOSIS — Z9849 Cataract extraction status, unspecified eye: Secondary | ICD-10-CM

## 2013-07-12 DIAGNOSIS — Z9851 Tubal ligation status: Secondary | ICD-10-CM

## 2013-07-12 DIAGNOSIS — Z833 Family history of diabetes mellitus: Secondary | ICD-10-CM

## 2013-07-12 DIAGNOSIS — M1711 Unilateral primary osteoarthritis, right knee: Secondary | ICD-10-CM

## 2013-07-12 HISTORY — PX: TOTAL KNEE ARTHROPLASTY WITH REVISION COMPONENTS: SHX6198

## 2013-07-12 LAB — HEMOGLOBIN A1C: Hgb A1c MFr Bld: 6.9 % — ABNORMAL HIGH (ref <5.7)

## 2013-07-12 LAB — GLUCOSE, CAPILLARY
Glucose-Capillary: 105 mg/dL — ABNORMAL HIGH (ref 70–99)
Glucose-Capillary: 182 mg/dL — ABNORMAL HIGH (ref 70–99)
Glucose-Capillary: 293 mg/dL — ABNORMAL HIGH (ref 70–99)

## 2013-07-12 SURGERY — TOTAL KNEE ARTHROPLASTY WITH REVISION COMPONENTS
Anesthesia: General | Site: Knee | Laterality: Right

## 2013-07-12 MED ORDER — DEXAMETHASONE 6 MG PO TABS
10.0000 mg | ORAL_TABLET | Freq: Three times a day (TID) | ORAL | Status: AC
  Administered 2013-07-12 – 2013-07-13 (×3): 10 mg via ORAL
  Filled 2013-07-12 (×4): qty 1

## 2013-07-12 MED ORDER — FENTANYL CITRATE 0.05 MG/ML IJ SOLN
INTRAMUSCULAR | Status: DC | PRN
  Administered 2013-07-12 (×5): 50 ug via INTRAVENOUS

## 2013-07-12 MED ORDER — BUPIVACAINE HCL (PF) 0.25 % IJ SOLN
INTRAMUSCULAR | Status: DC | PRN
  Administered 2013-07-12: 10 mL

## 2013-07-12 MED ORDER — CHLORHEXIDINE GLUCONATE 4 % EX LIQD: 60.0000 mL | Freq: Once | CUTANEOUS | Status: DC

## 2013-07-12 MED ORDER — BUPIVACAINE HCL (PF) 0.25 % IJ SOLN
INTRAMUSCULAR | Status: AC
  Filled 2013-07-12: qty 30

## 2013-07-12 MED ORDER — INSULIN GLARGINE 100 UNIT/ML ~~LOC~~ SOLN
37.0000 [IU] | Freq: Every day | SUBCUTANEOUS | Status: DC
  Filled 2013-07-12: qty 0.37

## 2013-07-12 MED ORDER — POTASSIUM CHLORIDE IN NACL 20-0.9 MEQ/L-% IV SOLN
INTRAVENOUS | Status: DC
  Administered 2013-07-12: 20:00:00 via INTRAVENOUS
  Filled 2013-07-12 (×9): qty 1000

## 2013-07-12 MED ORDER — ATORVASTATIN CALCIUM 40 MG PO TABS
40.0000 mg | ORAL_TABLET | Freq: Every day | ORAL | Status: DC
  Administered 2013-07-13 – 2013-07-14 (×2): 40 mg via ORAL
  Filled 2013-07-12 (×3): qty 1

## 2013-07-12 MED ORDER — FUROSEMIDE 40 MG PO TABS
40.0000 mg | ORAL_TABLET | Freq: Every day | ORAL | Status: DC
  Administered 2013-07-12 – 2013-07-15 (×4): 40 mg via ORAL
  Filled 2013-07-12 (×4): qty 1

## 2013-07-12 MED ORDER — SODIUM CHLORIDE 0.9 % IR SOLN
Status: DC | PRN
  Administered 2013-07-12: 3000 mL

## 2013-07-12 MED ORDER — ACETAMINOPHEN 650 MG RE SUPP: 650.0000 mg | Freq: Four times a day (QID) | RECTAL | Status: DC | PRN

## 2013-07-12 MED ORDER — HYDROMORPHONE HCL PF 1 MG/ML IJ SOLN
INTRAMUSCULAR | Status: AC
  Administered 2013-07-12: 0.5 mg
  Filled 2013-07-12: qty 1

## 2013-07-12 MED ORDER — INSULIN ASPART 100 UNIT/ML ~~LOC~~ SOLN: 15.0000 [IU] | Freq: Every day | SUBCUTANEOUS | Status: DC

## 2013-07-12 MED ORDER — HYDROMORPHONE HCL PF 1 MG/ML IJ SOLN
0.5000 mg | INTRAMUSCULAR | Status: DC | PRN
  Filled 2013-07-12: qty 1

## 2013-07-12 MED ORDER — INSULIN ASPART 100 UNIT/ML ~~LOC~~ SOLN
4.0000 [IU] | Freq: Three times a day (TID) | SUBCUTANEOUS | Status: DC
  Administered 2013-07-12 – 2013-07-15 (×7): 4 [IU] via SUBCUTANEOUS

## 2013-07-12 MED ORDER — GLYCOPYRROLATE 0.2 MG/ML IJ SOLN
INTRAMUSCULAR | Status: DC | PRN
  Administered 2013-07-12: 0.4 mg via INTRAVENOUS

## 2013-07-12 MED ORDER — DIPHENHYDRAMINE HCL 12.5 MG/5ML PO ELIX: 12.5000 mg | ORAL_SOLUTION | ORAL | Status: DC | PRN

## 2013-07-12 MED ORDER — ROCURONIUM BROMIDE 100 MG/10ML IV SOLN
INTRAVENOUS | Status: DC | PRN
  Administered 2013-07-12: 35 mg via INTRAVENOUS

## 2013-07-12 MED ORDER — METOCLOPRAMIDE HCL 10 MG PO TABS: 5.0000 mg | ORAL_TABLET | Freq: Three times a day (TID) | ORAL | Status: DC | PRN

## 2013-07-12 MED ORDER — LACTATED RINGERS IV SOLN
INTRAVENOUS | Status: DC | PRN
  Administered 2013-07-12 (×2): via INTRAVENOUS

## 2013-07-12 MED ORDER — CEFAZOLIN SODIUM-DEXTROSE 2-3 GM-% IV SOLR
2.0000 g | Freq: Four times a day (QID) | INTRAVENOUS | Status: AC
  Administered 2013-07-12 (×2): 2 g via INTRAVENOUS
  Filled 2013-07-12 (×3): qty 50

## 2013-07-12 MED ORDER — ONDANSETRON HCL 4 MG PO TABS: 4.0000 mg | ORAL_TABLET | Freq: Four times a day (QID) | ORAL | Status: DC | PRN

## 2013-07-12 MED ORDER — LIDOCAINE HCL (CARDIAC) 20 MG/ML IV SOLN
INTRAVENOUS | Status: DC | PRN
  Administered 2013-07-12: 70 mg via INTRAVENOUS

## 2013-07-12 MED ORDER — INSULIN ASPART 100 UNIT/ML ~~LOC~~ SOLN
0.0000 [IU] | Freq: Every day | SUBCUTANEOUS | Status: DC
  Administered 2013-07-12 – 2013-07-13 (×2): 3 [IU] via SUBCUTANEOUS

## 2013-07-12 MED ORDER — ONDANSETRON HCL 4 MG/2ML IJ SOLN: 4.0000 mg | Freq: Once | INTRAMUSCULAR | Status: DC | PRN

## 2013-07-12 MED ORDER — FLUTICASONE PROPIONATE HFA 44 MCG/ACT IN AERO
1.0000 | INHALATION_SPRAY | Freq: Two times a day (BID) | RESPIRATORY_TRACT | Status: DC
  Administered 2013-07-13 – 2013-07-15 (×5): 1 via RESPIRATORY_TRACT
  Filled 2013-07-12: qty 10.6

## 2013-07-12 MED ORDER — DEXAMETHASONE SODIUM PHOSPHATE 10 MG/ML IJ SOLN
10.0000 mg | Freq: Three times a day (TID) | INTRAMUSCULAR | Status: AC
  Filled 2013-07-12 (×3): qty 1

## 2013-07-12 MED ORDER — LIDOCAINE HCL 4 % MT SOLN
OROMUCOSAL | Status: DC | PRN
  Administered 2013-07-12: 4 mL via TOPICAL

## 2013-07-12 MED ORDER — MENTHOL 3 MG MT LOZG
1.0000 | LOZENGE | OROMUCOSAL | Status: DC | PRN
  Administered 2013-07-12: 3 mg via ORAL
  Filled 2013-07-12 (×2): qty 9

## 2013-07-12 MED ORDER — ONDANSETRON HCL 4 MG/2ML IJ SOLN
INTRAMUSCULAR | Status: DC | PRN
  Administered 2013-07-12: 4 mg via INTRAVENOUS

## 2013-07-12 MED ORDER — INSULIN ASPART 100 UNIT/ML ~~LOC~~ SOLN
0.0000 [IU] | Freq: Three times a day (TID) | SUBCUTANEOUS | Status: DC
  Administered 2013-07-12: 3 [IU] via SUBCUTANEOUS
  Administered 2013-07-13 (×2): 8 [IU] via SUBCUTANEOUS
  Administered 2013-07-13: 5 [IU] via SUBCUTANEOUS
  Administered 2013-07-14: 3 [IU] via SUBCUTANEOUS
  Administered 2013-07-14: 5 [IU] via SUBCUTANEOUS
  Administered 2013-07-14: 2 [IU] via SUBCUTANEOUS
  Administered 2013-07-15: 3 [IU] via SUBCUTANEOUS

## 2013-07-12 MED ORDER — PHENOL 1.4 % MT LIQD: 1.0000 | OROMUCOSAL | Status: DC | PRN

## 2013-07-12 MED ORDER — PNEUMOCOCCAL VAC POLYVALENT 25 MCG/0.5ML IJ INJ
0.5000 mL | INJECTION | INTRAMUSCULAR | Status: AC
  Administered 2013-07-15: 0.5 mL via INTRAMUSCULAR
  Filled 2013-07-12: qty 0.5

## 2013-07-12 MED ORDER — PROPOFOL 10 MG/ML IV BOLUS
INTRAVENOUS | Status: DC | PRN
  Administered 2013-07-12: 100 mg via INTRAVENOUS

## 2013-07-12 MED ORDER — METOCLOPRAMIDE HCL 5 MG/ML IJ SOLN: 5.0000 mg | Freq: Three times a day (TID) | INTRAMUSCULAR | Status: DC | PRN

## 2013-07-12 MED ORDER — OXYCODONE-ACETAMINOPHEN 5-325 MG PO TABS
1.0000 | ORAL_TABLET | ORAL | Status: DC | PRN
  Administered 2013-07-12 – 2013-07-13 (×3): 1 via ORAL
  Administered 2013-07-13: 1.5 via ORAL
  Administered 2013-07-13: 1 via ORAL
  Administered 2013-07-13 – 2013-07-15 (×6): 2 via ORAL
  Filled 2013-07-12: qty 2
  Filled 2013-07-12 (×2): qty 1
  Filled 2013-07-12: qty 2
  Filled 2013-07-12: qty 1
  Filled 2013-07-12 (×4): qty 2
  Filled 2013-07-12: qty 1
  Filled 2013-07-12: qty 2

## 2013-07-12 MED ORDER — SODIUM CHLORIDE 0.9 % IJ SOLN
INTRAMUSCULAR | Status: DC | PRN
  Administered 2013-07-12: 10:00:00

## 2013-07-12 MED ORDER — ACETAMINOPHEN 325 MG PO TABS: 650.0000 mg | ORAL_TABLET | Freq: Four times a day (QID) | ORAL | Status: DC | PRN

## 2013-07-12 MED ORDER — INFLUENZA VAC SPLIT QUAD 0.5 ML IM SUSP
0.5000 mL | INTRAMUSCULAR | Status: AC
  Administered 2013-07-15: 0.5 mL via INTRAMUSCULAR
  Filled 2013-07-12: qty 0.5

## 2013-07-12 MED ORDER — LACTATED RINGERS IV SOLN: INTRAVENOUS | Status: DC

## 2013-07-12 MED ORDER — ONDANSETRON HCL 4 MG/2ML IJ SOLN: 4.0000 mg | Freq: Four times a day (QID) | INTRAMUSCULAR | Status: DC | PRN

## 2013-07-12 MED ORDER — INSULIN GLARGINE 100 UNIT/ML ~~LOC~~ SOLN
37.0000 [IU] | Freq: Every day | SUBCUTANEOUS | Status: DC
  Administered 2013-07-13 – 2013-07-15 (×3): 37 [IU] via SUBCUTANEOUS
  Filled 2013-07-12 (×3): qty 0.37

## 2013-07-12 MED ORDER — CITALOPRAM HYDROBROMIDE 10 MG PO TABS
10.0000 mg | ORAL_TABLET | Freq: Every day | ORAL | Status: DC
  Administered 2013-07-12 – 2013-07-15 (×4): 10 mg via ORAL
  Filled 2013-07-12 (×4): qty 1

## 2013-07-12 MED ORDER — NEOSTIGMINE METHYLSULFATE 1 MG/ML IJ SOLN
INTRAMUSCULAR | Status: DC | PRN
  Administered 2013-07-12: 3 mg via INTRAVENOUS

## 2013-07-12 MED ORDER — DOCUSATE SODIUM 100 MG PO CAPS
100.0000 mg | ORAL_CAPSULE | Freq: Two times a day (BID) | ORAL | Status: DC
  Administered 2013-07-13 – 2013-07-15 (×4): 100 mg via ORAL
  Filled 2013-07-12 (×9): qty 1

## 2013-07-12 MED ORDER — HYDROMORPHONE HCL PF 1 MG/ML IJ SOLN
0.2500 mg | INTRAMUSCULAR | Status: DC | PRN
  Administered 2013-07-12: 0.5 mg via INTRAVENOUS

## 2013-07-12 MED ORDER — BUPIVACAINE LIPOSOME 1.3 % IJ SUSP
20.0000 mL | INTRAMUSCULAR | Status: DC
  Filled 2013-07-12: qty 20

## 2013-07-12 MED ORDER — ALUM & MAG HYDROXIDE-SIMETH 200-200-20 MG/5ML PO SUSP
30.0000 mL | ORAL | Status: DC | PRN
  Administered 2013-07-15: 30 mL via ORAL
  Filled 2013-07-12: qty 30

## 2013-07-12 MED ORDER — ASPIRIN EC 325 MG PO TBEC
325.0000 mg | DELAYED_RELEASE_TABLET | Freq: Every day | ORAL | Status: DC
  Administered 2013-07-13 – 2013-07-15 (×3): 325 mg via ORAL
  Filled 2013-07-12 (×4): qty 1

## 2013-07-12 SURGICAL SUPPLY — 71 items
APL SKNCLS STERI-STRIP NONHPOA (GAUZE/BANDAGES/DRESSINGS) ×2
AUG TIB SZ3 5 TL STAB RM/LL (Knees) ×2 IMPLANT
BANDAGE ESMARK 6X9 LF (GAUZE/BANDAGES/DRESSINGS) ×2 IMPLANT
BENZOIN TINCTURE PRP APPL 2/3 (GAUZE/BANDAGES/DRESSINGS) ×3 IMPLANT
BLADE SAG 18X100X1.27 (BLADE) ×6 IMPLANT
BLADE SAW RECIP 87.9 MT (BLADE) ×2 IMPLANT
BLOCK TIBIAL AUGMENT HALF (Knees) ×2 IMPLANT
BNDG CMPR 9X6 STRL LF SNTH (GAUZE/BANDAGES/DRESSINGS) ×2
BNDG ESMARK 6X9 LF (GAUZE/BANDAGES/DRESSINGS) ×3
BOWL SMART MIX CTS (DISPOSABLE) ×3 IMPLANT
CEMENT BONE SIMPLEX SPEEDSET (Cement) ×6 IMPLANT
CLOTH BEACON ORANGE TIMEOUT ST (SAFETY) ×3 IMPLANT
COVER SURGICAL LIGHT HANDLE (MISCELLANEOUS) ×3 IMPLANT
CUFF TOURNIQUET SINGLE 34IN LL (TOURNIQUET CUFF) ×3 IMPLANT
DRAPE EXTREMITY T 121X128X90 (DRAPE) ×3 IMPLANT
DRAPE PROXIMA HALF (DRAPES) ×3 IMPLANT
DRAPE U-SHAPE 47X51 STRL (DRAPES) ×3 IMPLANT
DRSG PAD ABDOMINAL 8X10 ST (GAUZE/BANDAGES/DRESSINGS) ×3 IMPLANT
DURAPREP 26ML APPLICATOR (WOUND CARE) ×3 IMPLANT
ELECT CAUTERY BLADE 6.4 (BLADE) ×3 IMPLANT
ELECT REM PT RETURN 9FT ADLT (ELECTROSURGICAL) ×3
ELECTRODE REM PT RTRN 9FT ADLT (ELECTROSURGICAL) ×2 IMPLANT
EVACUATOR 1/8 PVC DRAIN (DRAIN) ×3 IMPLANT
FACESHIELD LNG OPTICON STERILE (SAFETY) ×6 IMPLANT
GAUZE XEROFORM 5X9 LF (GAUZE/BANDAGES/DRESSINGS) ×1 IMPLANT
GLOVE BIO SURGEON STRL SZ 6.5 (GLOVE) ×3 IMPLANT
GLOVE BIO SURGEON STRL SZ8 (GLOVE) ×3 IMPLANT
GLOVE BIOGEL PI IND STRL 7.0 (GLOVE) ×2 IMPLANT
GLOVE BIOGEL PI INDICATOR 7.0 (GLOVE) ×1
GLOVE ORTHO TXT STRL SZ7.5 (GLOVE) ×3 IMPLANT
GOWN PREVENTION PLUS XLARGE (GOWN DISPOSABLE) ×6 IMPLANT
GOWN PREVENTION PLUS XXLARGE (GOWN DISPOSABLE) ×3 IMPLANT
GOWN STRL NON-REIN LRG LVL3 (GOWN DISPOSABLE) ×6 IMPLANT
HANDPIECE INTERPULSE COAX TIP (DISPOSABLE) ×3
IMMOBILIZER KNEE 22 UNIV (SOFTGOODS) ×3 IMPLANT
IMMOBILIZER KNEE 24 THIGH 36 (MISCELLANEOUS) IMPLANT
IMMOBILIZER KNEE 24 UNIV (MISCELLANEOUS)
KIT BASIN OR (CUSTOM PROCEDURE TRAY) ×3 IMPLANT
KIT ROOM TURNOVER OR (KITS) ×3 IMPLANT
KNEE LEVEL 1C ×2 IMPLANT
MANIFOLD NEPTUNE II (INSTRUMENTS) ×3 IMPLANT
NDL 18GX1X1/2 (RX/OR ONLY) (NEEDLE) ×1 IMPLANT
NDL HYPO 25GX1X1/2 BEV (NEEDLE) ×1 IMPLANT
NEEDLE 18GX1X1/2 (RX/OR ONLY) (NEEDLE) ×3 IMPLANT
NEEDLE HYPO 25GX1X1/2 BEV (NEEDLE) ×3 IMPLANT
NS IRRIG 1000ML POUR BTL (IV SOLUTION) ×3 IMPLANT
PACK TOTAL JOINT (CUSTOM PROCEDURE TRAY) ×3 IMPLANT
PAD ARMBOARD 7.5X6 YLW CONV (MISCELLANEOUS) ×6 IMPLANT
PAD CAST 4YDX4 CTTN HI CHSV (CAST SUPPLIES) ×2 IMPLANT
PADDING CAST COTTON 4X4 STRL (CAST SUPPLIES) ×3
PADDING CAST COTTON 6X4 STRL (CAST SUPPLIES) ×3 IMPLANT
SET HNDPC FAN SPRY TIP SCT (DISPOSABLE) ×2 IMPLANT
SPONGE GAUZE 4X4 12PLY (GAUZE/BANDAGES/DRESSINGS) ×3 IMPLANT
STAPLER VISISTAT 35W (STAPLE) ×3 IMPLANT
STEM CEMENTED TRIATHLON (Stem) ×2 IMPLANT
STEM EXTENDER 25MM (Stem) ×2 IMPLANT
STRIP CLOSURE SKIN 1/2X4 (GAUZE/BANDAGES/DRESSINGS) ×6 IMPLANT
SUCTION FRAZIER TIP 10 FR DISP (SUCTIONS) ×3 IMPLANT
SUT VIC AB 0 CT1 27 (SUTURE) ×6
SUT VIC AB 0 CT1 27XBRD ANBCTR (SUTURE) ×4 IMPLANT
SUT VIC AB 1 CT1 27 (SUTURE) ×6
SUT VIC AB 1 CT1 27XBRD ANTBC (SUTURE) ×4 IMPLANT
SUT VIC AB 2-0 CT1 27 (SUTURE) ×6
SUT VIC AB 2-0 CT1 TAPERPNT 27 (SUTURE) ×4 IMPLANT
SUT VIC AB 3-0 PS1 18 (SUTURE) ×3
SUT VIC AB 3-0 PS1 18XBRD (SUTURE) ×2 IMPLANT
SYR 50ML LL SCALE MARK (SYRINGE) ×3 IMPLANT
SYR CONTROL 10ML LL (SYRINGE) ×3 IMPLANT
TOWEL OR 17X24 6PK STRL BLUE (TOWEL DISPOSABLE) ×3 IMPLANT
TOWEL OR 17X26 10 PK STRL BLUE (TOWEL DISPOSABLE) ×3 IMPLANT
WATER STERILE IRR 1000ML POUR (IV SOLUTION) ×6 IMPLANT

## 2013-07-12 NOTE — Progress Notes (Signed)
Orders received back from MD. CPM is still okay but change order to toe touch down weight bearing status, Rayfield Citizen RN on 5N was notified of these orders.

## 2013-07-12 NOTE — Anesthesia Postprocedure Evaluation (Signed)
  Anesthesia Post-op Note  Patient: Holly Weiss  Procedure(s) Performed: Procedure(s): TOTAL KNEE ARTHROPLASTY WITH TIBIA REVISION COMPONENTS (Right)  Patient Location: PACU  Anesthesia Type:General  Level of Consciousness: awake, oriented, sedated and patient cooperative  Airway and Oxygen Therapy: Patient Spontanous Breathing  Post-op Pain: moderate  Post-op Assessment: Post-op Vital signs reviewed, Patient's Cardiovascular Status Stable, Respiratory Function Stable, Patent Airway, No signs of Nausea or vomiting and Pain level controlled  Post-op Vital Signs: stable  Complications: No apparent anesthesia complications

## 2013-07-12 NOTE — Progress Notes (Signed)
Holly Weiss is resting comfortably.  When I wake her to ask her about pain on a level of 0-10 she replied, "pretty bad".  I clarified with her the pain level.  I gave her Dilaudid for her pain.

## 2013-07-12 NOTE — Plan of Care (Signed)
Problem: Consults Goal: Diagnosis- Total Joint Replacement Primary Total Knee Right     

## 2013-07-12 NOTE — Evaluation (Signed)
Read, reviewed, and agree.  Kaylon Hitz B. Tristy Udovich, PT, DPT #[phone]  

## 2013-07-12 NOTE — Preoperative (Signed)
Beta Blockers   Reason not to administer Beta Blockers:Not Applicable 

## 2013-07-12 NOTE — Progress Notes (Signed)
Utilization review completed.  

## 2013-07-12 NOTE — H&P (View-Only) (Signed)
TOTAL KNEE ADMISSION H&P  Patient is being admitted for right total knee arthroplasty.  Subjective:  Chief Complaint:right knee pain.  HPI: Holly Weiss, 72 y.o. female, has a history of pain and functional disability in the right knee due to arthritis and has failed non-surgical conservative treatments for greater than 12 weeks to includeNSAID's and/or analgesics, corticosteriod injections, flexibility and strengthening excercises, use of assistive devices and activity modification.  Onset of symptoms was gradual, starting >10 years ago with gradually worsening course since that time. The patient noted no past surgery on the right knee(s).  Patient currently rates pain in the right knee(s) at 10 out of 10 with activity. Patient has night pain, worsening of pain with activity and weight bearing, pain that interferes with activities of daily living, pain with passive range of motion, crepitus and joint swelling.  Patient has evidence of periarticular osteophytes and joint space narrowing by imaging studies. There is no active infection.  Patient Active Problem List   Diagnosis Date Noted  . 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 Failure to attend appointment 05/28/2013  . Memory dysfunction 02/14/2013  . Depression 02/14/2013  . Visual hallucinations 02/14/2013  . Pain in joint of right knee 02/14/2013  . Type II or unspecified type diabetes mellitus without mention of complication, uncontrolled   . Neck pain, acute 07/21/2011  . Right knee pain 07/21/2011  . Preventative health care 01/27/2011  . Edema 09/09/2010  . KNEE PAIN, BILATERAL 09/01/2010  . 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 Chronic pain syndrome 01/21/2010  . DYSPEPSIA 01/21/2010  . FATIGUE 01/21/2010  . DYSPNEA ON EXERTION 01/21/2010  . MYOCARDIAL PERFUSION SCAN, WITH STRESS TEST, ABNORMAL 05/01/2009  . SHOULDER PAIN, BILATERAL 04/09/2009  . CHEST PAIN 04/09/2009  . NAUSEA 04/09/2009  . JOINT EFFUSION, LEFT KNEE 11/29/2008  .  Abdominal pain, epigastric 08/20/2008  . ANEMIA-NOS 01/18/2008  . CHF 01/18/2008  . ALLERGIC RHINITIS 01/18/2008  . LOW BACK PAIN 01/18/2008  . NEPHROLITHIASIS, HX OF 01/18/2008  . HYPERTENSION 10/31/2007  . DIABETIC  RETINOPATHY 05/30/2007  . HYPERLIPIDEMIA 05/30/2007  . MIGRAINE HEADACHE 05/30/2007  . 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 ASTHMA 05/30/2007  . MORBID OBESITY, HX OF 05/30/2007   Past Medical History  Diagnosis Date  . ANEMIA-NOS 01/18/2008  . 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 ASTHMA, WITH ACUTE EXACERBATION 11/29/2008  . Chronic pain syndrome 01/21/2010  . DIABETIC  RETINOPATHY 05/30/2007  . DM, UNCOMPLICATED, TYPE II, UNCONTROLLED 05/30/2007  . 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 HYPERLIPIDEMIA 05/30/2007  . HYPERTENSION 10/31/2007  . LOW BACK PAIN 01/18/2008  . MORBID OBESITY, HX OF 05/30/2007  . NEPHROLITHIASIS, HX OF 01/18/2008  . SHOULDER PAIN, BILATERAL 04/09/2009  . Migraine headache   . Spinal stenosis   . Bilateral carpal tunnel syndrome   . Shortness of breath     with walking  . Urgency of urination   . Arthritis   . Edema of both legs     takes Lasix  . Numbness and tingling in hands     Past Surgical History  Procedure Laterality Date  . Cataract extraction    . Cholecystectomy    . Thoracic fusion    . Lumbar disc surgury    . Tubal ligation    . Back surgery       (Not in a hospital admission) No Known Allergies  History  Substance Use Topics  . Smoking status: Never Smoker   . Smokeless tobacco: Not on file  . Alcohol Use: No  Family History  Problem Relation Age of Onset  . Diabetes Mother   . Heart disease Mother   . Lung cancer Father   . Heart disease Father   . Diabetes Sister      Review of Systems  Constitutional: Negative.   HENT: Negative.   Eyes: Negative.   Respiratory: Negative.   Cardiovascular: Negative.   Gastrointestinal: Negative.   Genitourinary: Negative.   Musculoskeletal: Positive for joint pain.  Skin:  Negative.   Neurological: Negative.   Endo/Heme/Allergies: Negative.   Psychiatric/Behavioral: Negative.     Objective:  Physical Exam  Constitutional: She is oriented to person, place, and time. She appears well-developed and well-nourished.  HENT:  Head: Normocephalic and atraumatic.  Eyes: EOM are normal. Pupils are equal, round, and reactive to light.  Neck: Neck supple.  Cardiovascular: Normal rate and regular rhythm.   Respiratory: Effort normal and breath sounds normal. No respiratory distress. She has no wheezes. She has no rales.  GI: Soft. Bowel sounds are normal. She exhibits no distension. There is no tenderness.  Musculoskeletal:  Exam of her right knee reveals increased varus on the right.  She has motion about 5 degrees of extension to about 90 degrees of flexion.  5 degrees of varus is only partially correctable.  Tibiofemoral and patellofemoral crepitus noted.  She is neurovascularly intact in bilateral lower extremities.    Neurological: She is alert and oriented to person, place, and time.  Skin: Skin is warm and dry.  Psychiatric: She has a normal mood and affect.    Vital signs in last 24 hours: @VSRANGES @  Labs:   Estimated body mass index is 38.97 kg/(m^2) as calculated from the following:   Height as of 07/07/13: 5' (1.524 m).   Weight as of 07/07/13: 90.521 kg (199 lb 9 oz).   Imaging Review Plain radiographs demonstrate severe degenerative joint disease of the right knee(s). The overall alignment ismild varus. The bone quality appears to be fair for age and reported activity level.  Assessment/Plan:  End stage arthritis, right knee   The patient history, physical examination, clinical judgment of the provider and imaging studies are consistent with end stage degenerative joint disease of the right knee(s) and total knee arthroplasty is deemed medically necessary. The treatment options including medical management, injection therapy arthroscopy and  arthroplasty were discussed at length. The risks and benefits of total knee arthroplasty were presented and reviewed. The risks due to aseptic loosening, infection, stiffness, patella tracking problems, thromboembolic complications and other imponderables were discussed. The patient acknowledged the explanation, agreed to proceed with the plan and consent was signed. Patient is being admitted for inpatient treatment for surgery, pain control, PT, OT, prophylactic antibiotics, VTE prophylaxis, progressive ambulation and ADL's and discharge planning. The patient is planning to be discharged home with home health services

## 2013-07-12 NOTE — Transfer of Care (Signed)
Immediate Anesthesia Transfer of Care Note  Patient: Holly Weiss  Procedure(s) Performed: Procedure(s): TOTAL KNEE ARTHROPLASTY WITH TIBIA REVISION COMPONENTS (Right)  Patient Location: PACU  Anesthesia Type:General  Level of Consciousness: awake, alert  and oriented  Airway & Oxygen Therapy: Patient Spontanous Breathing and Patient connected to nasal cannula oxygen  Post-op Assessment: Report given to PACU RN and Post -op Vital signs reviewed and stable  Post vital signs: Reviewed and stable  Complications: No apparent anesthesia complications

## 2013-07-12 NOTE — Anesthesia Preprocedure Evaluation (Addendum)
Anesthesia Evaluation  Patient identified by MRN, date of birth, ID band Patient awake    Reviewed: Allergy & Precautions, H&P , NPO status , Patient's Chart, lab work & pertinent test results  Airway Mallampati: II TM Distance: >3 FB Neck ROM: Limited    Dental  (+) Edentulous Upper, Edentulous Lower and Dental Advisory Given   Pulmonary asthma , sleep apnea ,          Cardiovascular hypertension, +CHF     Neuro/Psych  Headaches, Depression  Neuromuscular disease    GI/Hepatic negative GI ROS, Neg liver ROS,   Endo/Other  diabetes, Well Controlled, Type 1, Insulin Dependent and Oral Hypoglycemic AgentsMorbid obesity  Renal/GU negative Renal ROS     Musculoskeletal   Abdominal   Peds  Hematology  (+) anemia ,   Anesthesia Other Findings   Reproductive/Obstetrics                       Anesthesia Physical Anesthesia Plan  ASA: III  Anesthesia Plan: General   Post-op Pain Management:    Induction: Intravenous  Airway Management Planned: Oral ETT  Additional Equipment:   Intra-op Plan:   Post-operative Plan: Extubation in OR  Informed Consent: I have reviewed the patients History and Physical, chart, labs and discussed the procedure including the risks, benefits and alternatives for the proposed anesthesia with the patient or authorized representative who has indicated his/her understanding and acceptance.   Dental advisory given  Plan Discussed with: CRNA  Anesthesia Plan Comments:        Anesthesia Quick Evaluation

## 2013-07-12 NOTE — Progress Notes (Signed)
Orthopedic Tech Progress Note Patient Details:  Holly Weiss [birth year] [id number]  CPM Right Knee CPM Right Knee: On Right Knee Flexion (Degrees): 60 Right Knee Extension (Degrees): 0 Additional Comments: foot roll   Cammer, Mickie Bail 07/12/2013, 12:38 PM

## 2013-07-12 NOTE — Evaluation (Signed)
Physical Therapy Evaluation Patient Details Name: Holly Weiss MRN: [id number] DOB: [birth year] Today's Date: 07/12/2013 Time: 7829-5621 PT Time Calculation (min): 30 min  PT Assessment / Plan / Recommendation History of Present Illness  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. POD 0.  Clinical Impression  Patient is s/p R TKA resulting in the deficits listed below (see PT Problem List). Patient has limited function which is problematic due to her living alone.  Patient will benefit from skilled PT to increase their independence and safety with mobility (while adhering to their precautions) to allow discharge to SNF.     PT Assessment  Patient needs continued PT services    Follow Up Recommendations  SNF;Supervision/Assistance - 24 hour. Prefer [facility].       Barriers to Discharge Inaccessible home environment;Decreased caregiver support      Equipment Recommendations  Rolling walker with 5" wheels       Frequency 7X/week    Precautions / Restrictions Restrictions Weight Bearing Restrictions: Yes RLE Weight Bearing: Touchdown weight bearing   Pertinent Vitals/Pain 10/10 in knee. RN notified and medication given prior to moving.      Mobility  Bed Mobility Bed Mobility: Supine to Sit Supine to Sit: 2: Max assist Supine to Sit: Patient Percentage: 70% Details for Bed Mobility Assistance: Needs increased time to maneuver.  Needs assistance with moving both legs to the EOB.  Patient educated about proper form with using UE for assistance. Transfers Transfers: Editor, commissioning Transfers: 1: +2 Total assist Stand Pivot Transfers: Patient Percentage: 50% Details for Transfer Assistance: Patient requires UE assistance to help with standing.  Patient has trouble standing upright and moving her L LE in order to pivot into chair.  Pt has difficult time moving with toe touch WB precautions and will need continued practice to master this skill.  Pt requires  tactile cuing and assistance to take the small steps necessary for the transfer. Ambulation/Gait Ambulation/Gait Assistance: Not tested (comment)    Exercises     PT Diagnosis: Difficulty walking;Generalized weakness  PT Problem List: Decreased strength;Decreased activity tolerance;Decreased balance;Decreased mobility;Decreased cognition PT Treatment Interventions: Gait training;DME instruction;Stair training;Therapeutic activities;Balance training;Patient/family education;Neuromuscular re-education;Therapeutic exercise     PT Goals(Current goals can be found in the care plan section) Acute Rehab PT Goals Patient Stated Goal: to get better PT Goal Formulation: With patient/family Time For Goal Achievement: 07/26/13 Potential to Achieve Goals: Good  Visit Information  Last PT Received On: 07/12/13 Assistance Needed: +2 History of Present Illness: 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       Prior Functioning  Home Living Family/patient expects to be discharged to:: Skilled nursing facility Living Arrangements: Alone Available Help at Discharge: Available PRN/intermittently Type of Home: House Home Access: Stairs to enter Secretary/administrator of Steps: 4 Entrance Stairs-Rails: Right Home Layout: Two level Alternate Level Stairs-Number of Steps: 16 Alternate Level Stairs-Rails: Right Home Equipment: Cane - single point Prior Function Level of Independence: Needs assistance ADL's / Homemaking Assistance Needed: from family Communication Communication: No difficulties (very fatigued from today's procedures)    Cognition  Cognition Arousal/Alertness: Awake/alert Behavior During Therapy: WFL for tasks assessed/performed Overall Cognitive Status: Within Functional Limits for tasks assessed    Extremity/Trunk Assessment Upper Extremity Assessment Upper Extremity Assessment: Defer to OT evaluation Lower Extremity Assessment Lower Extremity Assessment: Generalized  weakness;RLE deficits/detail RLE Deficits / Details: Painful to lift her leg, or move in any manner   Balance    End of  Session PT - End of Session Equipment Utilized During Treatment: Gait belt;Right knee immobilizer Activity Tolerance: Patient limited by pain;Patient limited by fatigue Patient left: in chair;with call bell/phone within reach;with family/visitor present Nurse Communication: Mobility status;Patient requests pain meds CPM Right Knee CPM Right Knee: Off Right Knee Flexion (Degrees): 60 Right Knee Extension (Degrees): 0 Additional Comments: completed 2.5 hours  GP    Barrie Dunker, SPT Pager:  [phone]  Barrie Dunker 07/12/2013, 5:26 PM

## 2013-07-12 NOTE — Progress Notes (Signed)
Received report from David RN

## 2013-07-12 NOTE — Interval H&P Note (Signed)
History and Physical Interval Note:  07/12/2013 8:30 AM  Holly Weiss  has presented today for surgery, with the diagnosis of DJD RIGHT KNEE  The various methods of treatment have been discussed with the patient and family. After consideration of risks, benefits and other options for treatment, the patient has consented to  Procedure(s): TOTAL KNEE ARTHROPLASTY (Right) as a surgical intervention .  The patient's history has been reviewed, patient examined, no change in status, stable for surgery.  I have reviewed the patient's chart and labs.  Questions were answered to the patient's satisfaction.     Keaghan Bowens F

## 2013-07-12 NOTE — OR Nursing (Signed)
Called into [facility] and spoke with Passenger transport manager.  Communicated xray results and proximal tibial diaphysis fx.  Asked Dr. Eulah Pont if he wants to take Holly Weiss out of [facility].  Awaiting response.

## 2013-07-13 ENCOUNTER — Encounter (HOSPITAL_COMMUNITY): Payer: Self-pay | Admitting: Orthopedic Surgery

## 2013-07-13 LAB — BASIC METABOLIC PANEL
BUN: 28 mg/dL — ABNORMAL HIGH (ref 6–23)
Calcium: 8.9 mg/dL (ref 8.4–10.5)
Chloride: 103 mEq/L (ref 96–112)
GFR calc Af Amer: 60 mL/min — ABNORMAL LOW (ref >90)
GFR calc non Af Amer: 52 mL/min — ABNORMAL LOW (ref >90)
Sodium: 135 mEq/L (ref 135–145)

## 2013-07-13 LAB — CBC
MCH: 31.1 pg (ref 26.0–34.0)
MCHC: 34.5 g/dL (ref 30.0–36.0)
Platelets: 184 10*3/uL (ref 150–400)

## 2013-07-13 LAB — GLUCOSE, CAPILLARY: Glucose-Capillary: 244 mg/dL — ABNORMAL HIGH (ref 70–99)

## 2013-07-13 MED ORDER — SODIUM CHLORIDE 0.9 % IV BOLUS (SEPSIS)
500.0000 mL | Freq: Once | INTRAVENOUS | Status: AC
  Administered 2013-07-13: 500 mL via INTRAVENOUS

## 2013-07-13 NOTE — Progress Notes (Signed)
Clinical Social Work Department BRIEF PSYCHOSOCIAL ASSESSMENT 07/13/2013  Patient:  Holly Weiss, Holly Weiss     Account Number:  [id number]     Admit date:  07/12/2013  Clinical Social Worker:  Harless Nakayama  Date/Time:  07/13/2013 10:40 AM  Referred by:  Physician  Date Referred:  07/13/2013 Referred for  SNF Placement   Other Referral:   Interview type:  Patient Other interview type:   Spoke with pt and pt daugther at bedside    PSYCHOSOCIAL DATA Living Status:  ALONE Admitted from facility:   Level of care:   Primary support name:  Canary Brim [phone] Primary support relationship to patient:  CHILD, ADULT Degree of support available:   Pt has supportive family    CURRENT CONCERNS Current Concerns  Post-Acute Placement   Other Concerns:    SOCIAL WORK ASSESSMENT / PLAN CSW aware of PT recommendation for SNF. CSW spoke with pt and pt daughter about recommendation. They informed CSW they were already aware and would prefer ST rehab at [facility]. CSW explained SNF referral process. Pt and pt family are agreeable to being faxed out to [facility].    CSW has submitted clinicals to [employer] for insurance auth.   Assessment/plan status:  Psychosocial Support/Ongoing Assessment of Needs Other assessment/ plan:   Information/referral to community resources:   SNF list denied at this time (would like with bed offers if [facility] is not available)    PATIENT'S/FAMILY'S RESPONSE TO PLAN OF CARE: Pt and pt family agreeable to SNF for ST rehab.       Livian Vanderbeck, LCSWA [phone]

## 2013-07-13 NOTE — Progress Notes (Signed)
CSW Proofreader) spoke with pt and pt daughter Canary Brim. Pt has bed offer at [facility]. Pt daughter will be here at the hospital at 10am tomorrow to complete paperwork with facility. CSW spoke with insurance CM and informed of possible dc tomorrow or over weekend to [facility].  Caleen Taaffe, LCSWA [phone]

## 2013-07-13 NOTE — Care Management Note (Signed)
CARE MANAGEMENT NOTE 07/13/2013  Patient:  Holly Weiss, Holly Weiss   Account Number:  [id number]  Date Initiated:  07/13/2013  Documentation initiated by:  Vance Peper  Subjective/Objective Assessment:   72 yr old female s/p right total knee arthroplasty.     Action/Plan:   patient preoperatively setup with [facility].  patient will require shortterm rehab at [facility]- wants [facility]. Social worker is aware.   Anticipated DC Date:  07/14/2013   Anticipated DC Plan:  SKILLED NURSING FACILITY  In-house referral  Clinical Social Worker      DC Planning Services  CM consult      Choice offered to / List presented to:             Status of service:  Completed, signed off Medicare Important Message given?   (If response is "NO", the following Medicare IM given date fields will be blank) Date Medicare IM given:   Date Additional Medicare IM given:    Discharge Disposition:  SKILLED NURSING FACILITY  P

## 2013-07-13 NOTE — Progress Notes (Addendum)
Clinical Social Work Department CLINICAL SOCIAL WORK PLACEMENT NOTE 07/13/2013  Patient:  Holly Weiss, Holly Weiss  Account Number:  [id number] Admit date:  07/12/2013  Clinical Social Worker:  Sharol Harness, Theresia Majors  Date/time:  07/13/2013 10:50 AM  Clinical Social Work is seeking post-discharge placement for this patient at the following level of care:   SKILLED NURSING   (*CSW will update this form in Epic as items are completed)   07/13/2013  Patient/family provided with [facility] Department of Clinical Social Work's list of facilities offering this level of care within the geographic area requested by the patient (or if unable, by the patient's family).  07/13/2013  Patient/family informed of their freedom to choose among providers that offer the needed level of care, that participate in Medicare, Medicaid or managed care program needed by the patient, have an available bed and are willing to accept the patient.  07/13/2013  Patient/family informed of [facility]' ownership interest in [facility], as well as of the fact that they are under no obligation to receive care at this facility.  PASARR submitted to EDS on 07/13/2013 PASARR number received from EDS on 07/13/2013  FL2 transmitted to all facilities in geographic area requested by pt/family on  07/13/2013 FL2 transmitted to all facilities within larger geographic area on   Patient informed that his/her managed care company has contracts with or will negotiate with  certain facilities, including the following:     Patient/family informed of bed offers received:   Patient chooses bed at  Physician recommends and patient chooses bed at    Patient to be transferred to [facility] on 07/15/13- Jetta Lout, LCSWA   Patient to be transferred to facility by Non-emergency EMS ([facility])- Jetta Lout, LCSWA   The following physician request were entered in Epic:   Additional Comments:  Poonum Ambelal, LCSWA [phone]

## 2013-07-13 NOTE — Evaluation (Signed)
Occupational Therapy Evaluation Patient Details Name: ARINA TORRY MRN: [id number] DOB: Aug 07, 1940 Today's Date: 07/13/2013 Time: 5621-3086 OT Time Calculation (min): 23 min  OT Assessment / Plan / Recommendation History of present illness 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   Clinical Impression   Pt demos decline in function with ADLs and ADL mobility safety with decreased strength, balance and endurance. Pt would benefit from acute OT services to address impairments to increase level of function and safety. Pt requires 3 person assist for mobility and total A for LB ADLs at this time. Recommend SNF for d/c for short term rehab before returning home    OT Assessment  Patient needs continued OT Services    Follow Up Recommendations  SNF;Supervision/Assistance - 24 hour    Barriers to Discharge Decreased caregiver support pt plans to d/c to SNF for rehab  Equipment Recommendations  None recommended by OT;Other (comment) (TBD)    Recommendations for Other Services    Frequency  Min 2X/week    Precautions / Restrictions Precautions Required Braces or Orthoses: Other Brace/Splint Restrictions Weight Bearing Restrictions: Yes RLE Weight Bearing: Touchdown weight bearing   Pertinent Vitals/Pain 7/10 R knee pain    ADL  Grooming: Performed;Wash/dry hands;Wash/dry face;Supervision/safety;Set up;Min guard Where Assessed - Grooming: Unsupported sitting Upper Body Bathing: Simulated;Supervision/safety;Set up;Min guard Where Assessed - Upper Body Bathing: Unsupported sitting Lower Body Bathing: Simulated;+1 Total assistance Upper Body Dressing: Performed;Supervision/safety;Set up;Min guard Where Assessed - Upper Body Dressing: Unsupported sitting Lower Body Dressing: +1 Total assistance Toilet Transfer: Simulated;+2 Total assistance Toilet Transfer Method: Sit to stand Toileting - Clothing Manipulation and Hygiene: +1 Total assistance Where Assessed - Toileting Clothing  Manipulation and Hygiene: Standing Tub/Shower Transfer Method: Not assessed Transfers/Ambulation Related to ADLs: Cues for hand placement, RLE positioning, TDWBing, safe use of RW, & safety with descent.  (A) to achieve standing, balance, rotation of hips during pivot, & controlled descent.  Pt unable to take pivotal steps around to recliner.      OT Diagnosis: Generalized weakness;Acute pain  OT Problem List: Pain;Decreased strength;Decreased activity tolerance;Impaired balance (sitting and/or standing);Decreased knowledge of use of DME or AE OT Treatment Interventions: Self-care/ADL training;Therapeutic activities;Therapeutic exercise;Neuromuscular education;Patient/family education;DME and/or AE instruction;Balance training   OT Goals(Current goals can be found in the care plan section) Acute Rehab OT Goals Patient Stated Goal: to get better OT Goal Formulation: With patient Time For Goal Achievement: 07/20/13 Potential to Achieve Goals: Good ADL Goals Pt Will Perform Grooming: with supervision;with set-up;sitting Pt Will Perform Upper Body Bathing: with set-up;with supervision;sitting Pt Will Perform Lower Body Bathing: with max assist;with mod assist;sitting/lateral leans Pt Will Perform Upper Body Dressing: with set-up;with supervision;sitting Pt Will Transfer to Toilet: with max assist;with total assist;bedside commode Additional ADL Goal #1: Pt will complete bed mobility with min guard A to sit EOB in prep for ADLs  Visit Information  Last OT Received On: 07/13/13 Assistance Needed: +2 History of Present Illness: 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       Prior Functioning     Home Living Family/patient expects to be discharged to:: Skilled nursing facility Living Arrangements: Alone Available Help at Discharge: Available PRN/intermittently Type of Home: House Home Access: Stairs to enter Secretary/administrator of Steps: 4 Entrance Stairs-Rails: Right Home Layout:  Two level Prior Function Level of Independence: Needs assistance ADL's / Homemaking Assistance Needed: from family Communication Communication: No difficulties Dominant Hand: Right         Vision/Perception Vision - History Baseline  Vision: Wears glasses all the time Patient Visual Report: No change from baseline Perception Perception: Within Functional Limits   Cognition  Cognition Arousal/Alertness: Awake/alert Behavior During Therapy: WFL for tasks assessed/performed Overall Cognitive Status: Within Functional Limits for tasks assessed    Extremity/Trunk Assessment Upper Extremity Assessment Upper Extremity Assessment: Overall WFL for tasks assessed;Generalized weakness Lower Extremity Assessment Lower Extremity Assessment: Defer to PT evaluation Cervical / Trunk Assessment Cervical / Trunk Assessment: Normal     Mobility Bed Mobility Bed Mobility: Supine to Sit;Sitting - Scoot to Edge of Bed Supine to Sit: 4: Min assist;With rails;HOB elevated Supine to Sit: Patient Percentage: 70% Sitting - Scoot to Edge of Bed: 4: Min guard Details for Bed Mobility Assistance: Cues for use of UE's to increase ease of transitional movements.  (A) for RLE.   Transfers Transfers: Sit to Stand;Stand to Sit Sit to Stand: 1: +2 Total assist;With upper extremity assist;From bed Sit to Stand: Patient Percentage: 40% Stand to Sit: 1: +2 Total assist;With armrests;To chair/3-in-1 Stand to Sit: Patient Percentage: 30% Details for Transfer Assistance: Cues for hand placement, RLE positioning, TDWBing, safe use of RW, & safety with descent.  (A) to achieve standing, balance, rotation of hips during pivot, & controlled descent.  Pt unable to take pivotal steps around to recliner.          Balance Balance Balance Assessed: Yes   End of Session OT - End of Session Equipment Utilized During Treatment: Gait belt;Rolling walker Activity Tolerance: Patient limited by fatigue;Patient limited by  pain Patient left: in chair;with call bell/phone within reach CPM Right Knee CPM Right Knee: Off  GO     Margaretmary Eddy [facility] 07/13/2013, 3:24 PM

## 2013-07-13 NOTE — Progress Notes (Addendum)
Subjective: 1 Day Post-Op Procedure(s) (LRB): TOTAL KNEE ARTHROPLASTY WITH TIBIA REVISION COMPONENTS (Right) Patient reports pain as 10 on 0-10 scale.  Denies any nausea/vomiting.  Is voiding and has passed gas, but no bm as of yet.  Is tolerating diet well.    Objective: Vital signs in last 24 hours: Temp:  [97.4 F (36.3 C)-99 F (37.2 C)] 97.9 F (36.6 C) (12/11 0603) Pulse Rate:  [50-74] 74 (12/11 0603) Resp:  [11-24] 16 (12/11 0603) BP: (121-148)/(45-86) 142/63 mmHg (12/11 0603) SpO2:  [91 %-100 %] 99 % (12/11 0603)  Intake/Output from previous day: 12/10 0701 - 12/11 0700 In: 2430 [P.O.:480; I.V.:1900; IV Piggyback:50] Out: 510 [Drains:460; Blood:50] Intake/Output this shift: Total I/O In: -  Out: 110 [Drains:110]   Recent Labs  07/13/13 0410  HGB 8.8*    Recent Labs  07/13/13 0410  WBC 7.0  RBC 2.83*  HCT 25.5*  PLT 184    Recent Labs  07/13/13 0410  NA 135  K 4.8  CL 103  CO2 26  BUN 28*  CREATININE 1.05  GLUCOSE 251*  CALCIUM 8.9   No results found for this basename: LABPT, INR,  in the last 72 hours  Neurologically intact ABD soft Neurovascular intact Sensation intact distally Intact pulses distally Dorsiflexion/Plantar flexion intact Negative Homan's Hemovac drain pulled by me today.    Assessment/Plan: 1 Day Post-Op Procedure(s) (LRB): TOTAL KNEE ARTHROPLASTY WITH TIBIA REVISION COMPONENTS (Right) Advance diet Up with therapy, but continue with toe touch weight bearing or non-weight bearing. Must wear CAM boot at all times unless in CPM machine. Plan on saline lock today after 500cc bolus today. Plan on discharging to SNF on Saturday  ANTON, M. LINDSEY 07/13/2013, 7:00 AM

## 2013-07-13 NOTE — Progress Notes (Signed)
Orthopedic Tech Progress Note Patient Details:  Holly Weiss 1941/06/30 [id number] CAM walker applied to Right LE; use instructions explained.  Ortho Devices Type of Ortho Device: CAM walker Ortho Device/Splint Location: right LE Ortho Device/Splint Interventions: Application   Asia R Thompson 07/13/2013, 11:19 AM

## 2013-07-13 NOTE — Progress Notes (Addendum)
Patient ID: Holly Weiss, female   DOB: April 06, 1941, 72 y.o.   MRN: [id number] Was informed by patient during pre-operative visit of suspected beta lactam allergy so Clindamycin was chosen for pre-operative antibiotics.  This allergy was found to be erroneously reported.  The patient was administered Ancef post-operatively after the correct information was verified regarding her lack of allergy.     [facility] [address]   [address] [place], [place] [address] [phone] A Division of [facility]  Loreta Ave, M.D.   Robert A. Thurston Hole, M.D.   Burnell Blanks, M.D.   Eulas Post, M.D.   Lunette Stands, M.D Jewel Baize. Eulah Pont, M.D.  Buford Dresser, M.D.  Estell Harpin, M.D.    Melina Fiddler, M.D. Mary L. Isidoro Donning, PA-C  Kirstin A. Shepperson, PA-C  Josh Bloomdale, PA-C [place], [place]   RE: [name], [name]                                [phone]      DOB: [birth year] PROGRESS NOTE: 06-27-13 History of present illness: 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  Symptoms are unchanged from previous visits and she wishes to proceed with right total knee arthroplasty as scheduled.  She has significant pain with activities affecting her quality of life and ability to perform ADLs.  She has failed to respond to conservative treatment options. Current medications: She is currently on Aspirin 81 mg p.o. daily, B-D ultrafine pen 31 gauge x 8 mm two times a day as directed, Crestor 20 mg tablet one-half tablet daily, Flexeril 10 mg one tablet p.o. three times a day as needed, Lasix 40 mg tablet p.o. b.i.d., glucose test strip as directed, Norco 5 1-2 tablets p.o. q 6 hours p.r.n. pain, insulin Novolog 100 units/ml injection, injects 15 units sub-q daily, 5 units sub-q once a day, Lantus 100 unit injection inject 37 units  sub-q daily, multivitamin daily and Qvar 80 mcg/HCT inhaler one puff b.i.d. Allergies: Penicillin.  She also states that she is allergic to Betadine and Iodine and her reaction to this affects her kidneys. Past medical history: Anemia, osteoarthritis, asthma with acute exacerbation, chest pain, CHF, chronic pain syndrome, diabetic retinopathy, diarrhea, diabetes uncomplicated Type II, shortness of breath, dyspnea on exertion, edema, glaucoma, hyperlipidemia, hypertension, morbid obesity, abnormal stress test, nephrolithiasis, migraines, spinal stenosis, bilateral carpal tunnel syndrome and diabetes.   Past surgical history: Significant for cataract extraction, cholecystectomy, thoracic fusion, lumbar diskectomy and tubal ligation. Review of systems: Patient currently denies lightheadedness, dizziness, fevers, chills, chest pain, pressure, palpitations, shortness of breath, or any other pulmonary issues, GI, GU or neuro issues.  Family history: Significant for diabetes in her mother, heart disease in her mother, lung cancer in her father, heart disease in her father and diabetes in her sister.      Social history: Does not smoke or drink.  She is a widow and lives home alone.         EXAMINATION: Height: 5?1.  Weight: 206 pounds.  Temperature: 98.6.  Blood pressure: 142/65.  Pulse: 59. Alert and oriented x 3 and in no acute distress.  She is in a wheelchair at the time of visit.  Head is normocephalic, a traumatic.  PERRLA, EOMI.  Neck:  Unremarkable.  Lungs: CTA bilaterally.  No wheezes, rales or rhonchi.  Heart: RRR.  No murmurs appreciated.  Abdomen: Soft and non-tender.  NBS x 4.  Calves: Soft and non-tender bilaterally.  Neuro: Grossly intact to bilateral upper and lower extremities.  Skin: Warm and dry.  Exam of her right knee reveals increased varus on the right.  She has motion about 5 degrees of extension to about 90 degrees of flexion.  5 degrees of varus is only partially correctable.   Tibiofemoral and patellofemoral crepitus noted.  She is neurovascularly intact in bilateral lower extremities.    X-RAYS: Previous images of the right knee demonstrate severe end stage degenerative joint changes.     06-27-13 IMPRESSION: Right knee end stage degenerative joint changes and chronic pain which have failed to respond to conservative treatment.  PLAN: We will proceed with right total knee arthroplasty as scheduled.  Discussed the risks, benefits and possible complications of surgery.  Rehab and recovery time discussed.  All questions were answered.  Unsure whether patient will need home health or skilled nursing at the time of discharge.  Unsure at this time of type of DVT prophylaxis.    Loreta Ave, M.D.   Electronically verified by Loreta Ave, M.D. DFM(MLA):jjh D 06-27-13 T 07-03-13

## 2013-07-13 NOTE — Progress Notes (Addendum)
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  Requires significant +2 assist for bed>chair transfer due to weakness & restricted WBing RLE.  Cont with current POC.     Follow Up Recommendations  SNF;Supervision/Assistance - 24 hour     Does the patient have the potential to tolerate intense rehabilitation     Barriers to Discharge        Equipment Recommendations  Rolling walker with 5" wheels    Recommendations for Other Services    Frequency 7X/week   Progress towards PT Goals Progress towards PT goals: Progressing toward goals (slowly)  Plan Current plan remains appropriate    Precautions / Restrictions Precautions Required Braces or Orthoses: Other Brace/Splint (Cam boot RLE) Restrictions RLE Weight Bearing: Touchdown weight bearing   Pertinent Vitals/Pain "it doesn't hurt that bad"    Mobility  Bed Mobility Bed Mobility: Supine to Sit;Sitting - Scoot to Edge of Bed Supine to Sit: 4: Min assist;With rails;HOB elevated Sitting - Scoot to [place] of Bed: 4: Min guard Details for Bed Mobility Assistance: Cues for use of UE's to increase ease of transitional movements.  (A) for RLE.   Transfers Transfers: Sit to Stand;Stand to Sit;Stand Pivot Transfers Sit to Stand: 1: +2 Total assist;With upper extremity assist;From bed Sit to Stand: Patient Percentage: 40% Stand to Sit: 1: +2 Total assist;With armrests;To chair/3-in-1 Stand to Sit: Patient Percentage: 30% Stand Pivot Transfers: 1: +2 Total assist Stand Pivot Transfers: Patient Percentage: 30% Details for Transfer Assistance: Cues for hand placement, RLE positioning, TDWBing, safe use of RW, use of UE's on RW & push  through to stand upright,  & safety with descent.  (A) to achieve standing, balance, rotation of hips during pivot, & controlled descent.  Pt unable to take pivotal steps around to recliner.   Ambulation/Gait Ambulation/Gait Assistance: Not tested (comment)    Exercises Total Joint Exercises Ankle Circles/Pumps: AROM;Both;10 reps Quad Sets: AROM;Strengthening;Both;10 reps Heel Slides: AAROM;Strengthening;Right;10 reps Hip ABduction/ADduction: AAROM;Strengthening;Right;10 reps Straight Leg Raises: AAROM;Strengthening;Right;10 reps     PT Goals (current goals can now be found in the care plan section) Acute Rehab PT Goals Patient Stated Goal: to get better PT Goal Formulation: With patient/family Time For Goal Achievement: 07/26/13 Potential to Achieve Goals: Good  Visit Information  Last PT Received On: 07/13/13 Assistance Needed: +2 History of Present Illness: 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

## 2013-07-14 LAB — BASIC METABOLIC PANEL
BUN: 38 mg/dL — ABNORMAL HIGH (ref 6–23)
CO2: 26 mEq/L (ref 19–32)
Calcium: 9 mg/dL (ref 8.4–10.5)
Chloride: 105 mEq/L (ref 96–112)
Creatinine, Ser: 1.18 mg/dL — ABNORMAL HIGH (ref 0.50–1.10)
GFR calc Af Amer: 52 mL/min — ABNORMAL LOW (ref >90)
Glucose, Bld: 262 mg/dL — ABNORMAL HIGH (ref 70–99)
Potassium: 4.4 mEq/L (ref 3.5–5.1)

## 2013-07-14 LAB — CBC
HCT: 23.8 % — ABNORMAL LOW (ref 36.0–46.0)
Hemoglobin: 8.2 g/dL — ABNORMAL LOW (ref 12.0–15.0)
MCH: 30.8 pg (ref 26.0–34.0)
MCV: 89.5 fL (ref 78.0–100.0)
Platelets: 176 10*3/uL (ref 150–400)
RBC: 2.66 MIL/uL — ABNORMAL LOW (ref 3.87–5.11)
RDW: 14.8 % (ref 11.5–15.5)
WBC: 9.1 10*3/uL (ref 4.0–10.5)

## 2013-07-14 LAB — GLUCOSE, CAPILLARY
Glucose-Capillary: 248 mg/dL — ABNORMAL HIGH (ref 70–99)
Glucose-Capillary: 198 mg/dL — ABNORMAL HIGH (ref 70–99)
Glucose-Capillary: 137 mg/dL — ABNORMAL HIGH (ref 70–99)
Glucose-Capillary: 136 mg/dL — ABNORMAL HIGH (ref 70–99)

## 2013-07-14 MED ORDER — OXYCODONE-ACETAMINOPHEN 5-325 MG PO TABS: 1.0000 | ORAL_TABLET | ORAL | Status: DC | PRN

## 2013-07-14 MED ORDER — ASPIRIN 325 MG PO TBEC: 325.0000 mg | DELAYED_RELEASE_TABLET | Freq: Every day | ORAL | Status: DC

## 2013-07-14 NOTE — Op Note (Signed)
NAMEMarland Kitchen  ARYAA, BUNTING NO.:  [id number]  MEDICAL RECORD NO.:  [id number]  LOCATION:  5N28C                        FACILITY:  [facility]  PHYSICIAN:  Loreta Ave, M.D. DATE OF BIRTH:  Aug 21, 1940  DATE OF PROCEDURE:  07/12/2013 DATE OF DISCHARGE:                              OPERATIVE REPORT   PREOPERATIVE DIAGNOSES:  Right knee end-stage degenerative arthritis. Marked varus.  Extensive bone loss, proximal tibia medially as well as medial femoral condyle.  Marked translation of the femur almost all the tibia medially.  POSTOPERATIVE DIAGNOSIS:  Right knee end-stage degenerative arthritis. Marked varus.  Extensive bone loss, proximal tibia medially as well as medial femoral condyle.  Marked translation of the femur almost all the tibia medially.  PROCEDURE:  Right knee modified minimally invasive total knee replacement, Stryker triathlon prosthesis.  Soft tissue balancing including lateral retinacular release.  A cemented pegged posterior stabilized #3 femoral component.  A cemented #3 tibial component with a 12 x 75 centralizing stem.  A 5-mm augment on the medial side.  A 9-mm polyethylene component.  Cemented resurfacing 32-mm patellar component.  SURGEON:  Loreta Ave, M.D.  ASSISTANT:  Odelia Gage, PA and Skip Mayer, [place] both present throughout the entire case, and necessary for timely completion of procedure.  ANESTHESIA:  General.  BLOOD LOSS:  Minimal.  SPECIMENS:  None.  CULTURES:  None.  COMPLICATIONS:  None.  DRESSING:  Soft compressive knee immobilizer.  DRAINS:  Hemovac x1.  TOURNIQUET TIME:  1 hour 30 minutes.  PROCEDURE:  The patient was brought into the operating room, placed on the operating table in supine position.  After adequate anesthesia had been obtained, knee examined.  Extensive destructive changes.  10-12 degrees of varus but that was actually correctable to neutral. Significant bone loss.  Marked tethering  patellofemoral joint.  I can get a full extension, about 90 degrees of flexion.  Tourniquet was applied, prepped and draped in usual sterile fashion.  Exsanguinated with elevation of Esmarch, tourniquet inflated to 350 mmHg.  Straight incision above the patella down to tibial tubercle.  Hemostasis with cautery.  Medial arthrotomy, vastus splitting, preserving quad tendon. Extensive adhesions debrided throughout.  Medial capsule release. Extensive grade 4 changes.  The entire medial half of the proximal tibia was worn off the defective.  Distal femur was exposed.  Very osteopenic throughout.  A lot of erosive changes in medial femoral condyle. Flexible intramedullary guide in distal femur.  I then did an 8 mm resection, 5 degrees of valgus.  Using epicondylar axis, the femur was sized, cut, and fitted for a posterior stabilized pegged #3 component. Sufficient bone I did not need to add a stem there.  Proximal tibia was exposed.  Extramedullary guide.  A 0 degree cut.  This was taken down as low was I get laterally.  This still left a generous defect medially. With the appropriate jigs and instrumentation, I was able to get a stable surface with that appropriate cut for a 5 mm augment on the medial side.  I then used trials to set rotation.  Once rotation was set, I then did proximal and distal reaming of the  tibia utilizing no power instrumentation at all because of her osteopenia.  I finally got it fairly well fitted seated prosthesis, I utilized a #3 component with a 5-mm augment medially and a 12 x 75 stem.  Patella was exposed. Marked erosion.  Brought back to a reasonable bone.  Drill sized fitted for a 32-mm component.  Trials put in place using a 9 mm insert on the tibia.  I had to do lateral at least for free of the patellofemoral joint.  Once that was accomplished, I was very pleased with flexion, extension, balancing in both good biomechanical axis, good patellar tracking.  All  trials had been removed.  Copious irrigation with a pulse irrigating device.  Cement prepared, placed on all components which were easily seated.  Nothing on toward occurring when I seated a tibial component.  Polyethylene attached to tibia, knee reduced.  Patella held with clamp.  Once cement hardened, the knee was examined again.  Again I was pleased with alignment, stability, motion, and tracking.  Wound irrigated.  Soft tissues were injected with Exparel.  Hemovac was placed.  Arthrotomy closed with #1 Vicryl.  Skin and subcutaneous tissue were closed with subcutaneous subcuticular closure.  Margins were injected with Marcaine.  Sterile compressive dressing applied. Tourniquet deflated and removed.  Knee immobilizer applied.  Anesthesia reversed.  Brought to the recovery room.  Tolerated the surgery well. No complications.     Loreta Ave, M.D.     DFM/MEDQ  D:  07/13/2013  T:  07/14/2013  Job:  [id number]

## 2013-07-14 NOTE — Progress Notes (Signed)
CSW (Clinical Child psychotherapist) prepared pt dc packet and placed with shadow chart. CSW notified by pt nurse that dc plan is for tomorrow (07/15/13). Pt is able to dc today if medically ready (insurance approval received). CSW left message for PA with office (PA in surgery) and asked to please update dc summary to reflect dc date for tomorrow if pt will not be leaving today.   CSW has also spoken with family and facility to notify of likely dc tomorrow. Pt thankful for update and happy to dc tomorrow.  Mae Cianci, LCSWA [phone]

## 2013-07-14 NOTE — Progress Notes (Signed)
Subjective: 2 Days Post-Op Procedure(s) (LRB): TOTAL KNEE ARTHROPLASTY WITH TIBIA REVISION COMPONENTS (Right) Patient reports pain as 3 on 0-10 scale and 4 on 0-10 scale.  Patient's pain level has decreased significantly since yesterday.  No nausea/vomiting.  Passing gas but no bm.    Objective: Vital signs in last 24 hours: Temp:  [98.1 F (36.7 C)] 98.1 F (36.7 C) (12/12 0520) Pulse Rate:  [63-69] 63 (12/12 0520) Resp:  [16-20] 18 (12/12 0520) BP: (114-137)/(51-62) 132/62 mmHg (12/12 0520) SpO2:  [98 %-99 %] 99 % (12/12 0520)  Intake/Output from previous day: 12/11 0701 - 12/12 0700 In: 500 [IV Piggyback:500] Out: -  Intake/Output this shift:     Recent Labs  07/13/13 0410 07/14/13 0640  HGB 8.8* 8.2*    Recent Labs  07/13/13 0410 07/14/13 0640  WBC 7.0 9.1  RBC 2.83* 2.66*  HCT 25.5* 23.8*  PLT 184 176    Recent Labs  07/13/13 0410  NA 135  K 4.8  CL 103  CO2 26  BUN 28*  CREATININE 1.05  GLUCOSE 251*  CALCIUM 8.9   No results found for this basename: LABPT, INR,  in the last 72 hours  Neurologically intact ABD soft Neurovascular intact Sensation intact distally Intact pulses distally Dorsiflexion/Plantar flexion intact Incision: dressing C/D/I and scant drainage No cellulitis present Compartment soft Dressing changed by me today.  Mild edema.  No erythema or purulent drainage noted. Negative Homen's  Assessment/Plan: 2 Days Post-Op Procedure(s) (LRB): TOTAL KNEE ARTHROPLASTY WITH TIBIA REVISION COMPONENTS (Right) Advance diet Up with therapy D/C IV fluids Plan for discharge tomorrow Discharge to SNF Will be on ASA 325 for dvt prophylaxis Continue to be touch down weight bearing but switch to non-weight bearing if touch down not possible Must be in cam boot at all times unless in cpm machine.  Gearldine Shown 07/14/2013, 7:50 AM

## 2013-07-14 NOTE — Progress Notes (Signed)
Physical Therapy Treatment Patient Details Name: Holly Weiss MRN: [id number] DOB: November 17, 1940 Today's Date: 07/14/2013 Time: 7829-5621 PT Time Calculation (min): 23 min  PT Assessment / Plan / Recommendation  History of Present Illness 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   PT Comments   Pt progressing slowly with OOB activity as she has difficulty maintaining TDWBing RLE but is very motivated & works hard.     Follow Up Recommendations  SNF;Supervision/Assistance - 24 hour     Does the patient have the potential to tolerate intense rehabilitation     Barriers to Discharge        Equipment Recommendations  Rolling walker with 5" wheels    Recommendations for Other Services    Frequency 7X/week   Progress towards PT Goals Progress towards PT goals: Progressing toward goals (slowly)  Plan Current plan remains appropriate    Precautions / Restrictions Precautions Required Braces or Orthoses: Other Brace/Splint Other Brace/Splint: cam boot- no MD order in chart but pt states MD told her to wear when up Restrictions RLE Weight Bearing: Touchdown weight bearing       Mobility  Bed Mobility Bed Mobility: Supine to Sit;Sitting - Scoot to Edge of Bed Supine to Sit: 4: Min assist;With rails Sitting - Scoot to [place] of Bed: 5: Supervision Details for Bed Mobility Assistance: (A) to move RLE to EOB Transfers Transfers: Sit to Stand;Stand to Sit Sit to Stand: 1: +2 Total assist;With upper extremity assist;With armrests;From chair/3-in-1;From bed Sit to Stand: Patient Percentage: 50% Stand to Sit: With upper extremity assist;3: Mod assist;With armrests;To chair/3-in-1 Stand Pivot Transfers: 1: +2 Total assist Stand Pivot Transfers: Patient Percentage: 60% Details for Transfer Assistance: cues for hand placement & technique.  Pt appears to be putting more than TDWBing through RLE during transfer.   Ambulation/Gait Ambulation/Gait Assistance: 4: Min assist (2nd person with  chair) Ambulation Distance (Feet):  (4 steps forwards) Assistive device: Rolling walker Ambulation/Gait Assistance Details: Cues for use of UE's on RW to offload weight from RLE during LLE swing phase, cues for posture.  Distance limited due to pt bearing more than TDWBing RLE  Gait Pattern: Step-to pattern Stairs: No Wheelchair Mobility Wheelchair Mobility: No    Exercises Total Joint Exercises Ankle Circles/Pumps: AROM;Both;10 reps Quad Sets: AROM;Strengthening;Both;10 reps Heel Slides: AAROM;Strengthening;Right;10 reps Straight Leg Raises: AROM;Strengthening;Right;10 reps    PT Goals (current goals can now be found in the care plan section) Acute Rehab PT Goals Patient Stated Goal: to get better PT Goal Formulation: With patient/family Time For Goal Achievement: 07/26/13 Potential to Achieve Goals: Good  Visit Information  Last PT Received On: 07/14/13 Assistance Needed: +2 History of Present Illness: 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    Subjective Data  Patient Stated Goal: to get better   Cognition  Cognition Arousal/Alertness: Awake/alert Behavior During Therapy: WFL for tasks assessed/performed Overall Cognitive Status: Within Functional Limits for tasks assessed    Balance     End of Session PT - End of Session Equipment Utilized During Treatment: Gait belt Activity Tolerance: Patient tolerated treatment well Patient left: in chair;with call bell/phone within reach Nurse Communication: Mobility status   GP     Lara Mulch 07/14/2013, 12:53 PM   Verdell Face, PTA [phone] 07/14/2013

## 2013-07-14 NOTE — Discharge Summary (Signed)
Patient ID: ATARAH CADOGAN MRN: [id number] DOB/AGE: [birth year] 72 y.o.  Admit date: 07/12/2013 Discharge date: 07/14/2013  Admission Diagnoses:  Active Problems:   DJD (degenerative joint disease) of knee   Discharge Diagnoses:  Same  Past Medical History  Diagnosis Date  . ANEMIA-NOS 01/18/2008  . 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 ASTHMA, WITH ACUTE EXACERBATION 11/29/2008  . Chronic pain syndrome 01/21/2010  . DIABETIC  RETINOPATHY 05/30/2007  . DM, UNCOMPLICATED, TYPE II, UNCONTROLLED 05/30/2007  . 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 HYPERLIPIDEMIA 05/30/2007  . HYPERTENSION 10/31/2007  . LOW BACK PAIN 01/18/2008  . MORBID OBESITY, HX OF 05/30/2007  . NEPHROLITHIASIS, HX OF 01/18/2008  . SHOULDER PAIN, BILATERAL 04/09/2009  . Migraine headache   . Spinal stenosis   . Bilateral carpal tunnel syndrome   . Shortness of breath     with walking  . Urgency of urination   . Arthritis   . Edema of both legs     takes Lasix  . Numbness and tingling in hands     Surgeries: Procedure(s): TOTAL KNEE ARTHROPLASTY WITH TIBIA REVISION COMPONENTS on 07/12/2013   Consultants:    Discharged Condition: Improved  Hospital Course: KAMEO BAINS is an 72 y.o. female who was admitted 07/12/2013 for operative treatment of<principal problem not specified>. Patient has severe unremitting pain that affects sleep, daily activities, and work/hobbies. After pre-op clearance the patient was taken to the operating room on 07/12/2013 and underwent  Procedure(s): TOTAL KNEE ARTHROPLASTY WITH TIBIA REVISION COMPONENTS.    Patient was given perioperative antibiotics:     Anti-infectives   Start     Dose/Rate Route Frequency Ordered Stop   07/12/13 1400  ceFAZolin (ANCEF) IVPB 2 g/50 mL premix     2 g 100 mL/hr over 30 Minutes Intravenous Every 6 hours 07/12/13 1311 07/12/13 2123   07/12/13 0600  clindamycin (CLEOCIN) IVPB 900 mg     900 mg 100 mL/hr over 30 Minutes Intravenous On call to O.R. 07/11/13 1430  07/12/13 0839       Patient was given sequential compression devices, early ambulation, and chemoprophylaxis to prevent DVT.  Patient benefited maximally from hospital stay.  There was a cortical fracture of the right tibia noted post-op.  Patient placed in cam boot for 6 weeks.    Recent vital signs:  Patient Vitals for the past 24 hrs:  BP Temp Pulse Resp SpO2  07/14/13 0520 132/62 mmHg 98.1 F (36.7 C) 63 18 99 %  07/13/13 2337 - - - 20 98 %  07/13/13 2208 - - - - 98 %  07/13/13 2040 114/53 mmHg 98.1 F (36.7 C) 65 16 99 %  07/13/13 2000 - - - 20 98 %  07/13/13 1333 137/51 mmHg 98.1 F (36.7 C) 69 18 98 %     Recent laboratory studies:   Recent Labs  07/13/13 0410 07/14/13 0640  WBC 7.0 9.1  HGB 8.8* 8.2*  HCT 25.5* 23.8*  PLT 184 176  NA 135  --   K 4.8  --   CL 103  --   CO2 26  --   BUN 28*  --   CREATININE 1.05  --   GLUCOSE 251*  --   CALCIUM 8.9  --      Discharge Medications:     Medication List    STOP taking these medications       aspirin 81 MG tablet  Replaced by:  aspirin 325 MG EC tablet  HYDROcodone-acetaminophen 5-325 MG per tablet  Commonly known as:  NORCO/VICODIN      TAKE these medications       aspirin 325 MG EC tablet  Take 1 tablet (325 mg total) by mouth daily with breakfast.     citalopram 10 MG tablet  Commonly known as:  CELEXA  Take 1 tablet (10 mg total) by mouth daily.     CRESTOR 20 MG tablet  Generic drug:  rosuvastatin  TAKE ONE-HALF TABLET DAILY     cyclobenzaprine 10 MG tablet  Commonly known as:  FLEXERIL  Take 10 mg by mouth 3 (three) times daily as needed for muscle spasms.     furosemide 40 MG tablet  Commonly known as:  LASIX  Take 40 mg by mouth daily.     insulin aspart 100 UNIT/ML injection  Commonly known as:  novoLOG  Inject 15 Units into the skin daily.     insulin glargine 100 UNIT/ML injection  Commonly known as:  LANTUS  Inject 37 Units into the skin daily.     naproxen sodium  220 MG tablet  Commonly known as:  ANAPROX  Take 220 mg by mouth 2 (two) times daily as needed. For pain     ONE-A-DAY EXTRAS ANTIOXIDANT PO  Take by mouth daily.     oxyCODONE-acetaminophen 5-325 MG per tablet  Commonly known as:  PERCOCET/ROXICET  Take 1-2 tablets by mouth every 4 (four) hours as needed for moderate pain or severe pain.     QVAR 80 MCG/ACT inhaler  Generic drug:  beclomethasone  TAKE 1 PUFF TWICE A DAY        Diagnostic Studies: Dg Chest 2 View  07/07/2013   CLINICAL DATA:  Preoperative examination (right total knee replacement)  EXAM: CHEST  2 VIEW  COMPARISON:  01/21/2010; 04/09/2009; 01/18/2008  FINDINGS: Grossly unchanged enlarged cardiac silhouette and mediastinal contours with mild tortuosity of the thoracic aorta. Evaluation of the retrosternal clear space is obscured secondary to overlying soft tissues. The lungs appear hyperexpanded with flattening of the bilateral hemidiaphragms and blunting of the bilateral costophrenic angles. Chronic findings of pulmonary venous congestion without frank evidence of edema. No definite pleural effusions. Improved aeration of the bilateral lung bases with persistent minimal right basilar linear opacities favored to represent atelectasis or scar. No focal airspace opacities. No pneumothorax. Grossly unchanged bones. Post cholecystectomy.  IMPRESSION: 1. Persistent findings of cardiomegaly and pulmonary venous congestion without frank evidence of edema. 2. Improved aeration of the lungs with persistent right basilar opacities favored to represent atelectasis.   Electronically Signed   By: Simonne Come M.D.   On: 07/07/2013 13:13   Dg Tibia/fibula Right  07/12/2013   CLINICAL DATA:  Right total knee arthroplasty.  EXAM: RIGHT TIBIA AND FIBULA - 2 VIEW  COMPARISON:  Earlier knee films, same date.  FINDINGS: As demonstrated on the prior knee films there is a fracture involving the lateral cortex of the tibia at and below the level of the  tibial prosthesis. The fracture extends approximately 4.6 cm below the tip of the prosthesis. No other fractures are identified.  IMPRESSION: Lateral cortical tibial fracture at and below the level of the prosthesis.   Electronically Signed   By: Loralie Champagne M.D.   On: 07/12/2013 19:53   Dg Knee Right Port  07/12/2013   CLINICAL DATA:  Postoperative right knee replacement.  EXAM: PORTABLE RIGHT KNEE - 1-2 VIEW  COMPARISON:  Preoperative films of January 30, 2013.  FINDINGS:  The post the patient has undergone right knee joint prosthesis placement. There is a fracture of the proximal tibial diaphysis adjacent to the inferior portion of the tibial component of the prosthesis. The femoral component appears adequately positioned. There is a surgical drain line in place. The proximal fibula appears intact.  IMPRESSION: The patient has sustained a fracture through the cortex of the proximal tibial shaft adjacent to the inferior aspect of the tibial component of the prosthesis.  These results were called by me by telephone at the time of interpretation on 07/12/2013 at 1:13 PM to Charlann Lange, RN,who verbally acknowledged these results. He will contact Dr. Eulah Pont with this report.   Electronically Signed   By: David  [place]   On: 07/12/2013 13:14    Disposition: 01-Home or Self Care  Discharge Orders   Future Orders Complete By Expires   Call MD / Call 911  As directed    Comments:     If you experience chest pain or shortness of breath, CALL 911 and be transported to the hospital emergency room.  If you develope a fever above 101 F, pus (white drainage) or increased drainage or redness at the wound, or calf pain, call your surgeon's office.   Change dressing  As directed    Comments:     Change dressing on Sunday, then change the dressing daily with sterile 4 x 4 inch gauze dressing and apply TED hose.  You may clean the incision with alcohol prior to redressing.   Change dressing  As directed     Comments:     Change the dressing daily with sterile 4 x 4 inch gauze dressing and paper tape.  You may clean the incision with alcohol prior to redressing   Constipation Prevention  As directed    Comments:     Drink plenty of fluids.  Prune juice may be helpful.  You may use a stool softener, such as Colace (over the counter) 100 mg twice a day.  Use MiraLax (over the counter) for constipation as needed.   CPM  As directed    Comments:     Continuous passive motion machine (CPM):      Use the CPM from 0- to 60 for 6 hours per day.      You may increase by 10 per day.  You may break it up into 2 or 3 sessions per day.      Use CPM for 3-4 weeks or until you are told to stop.   Diet - low sodium heart healthy  As directed    Discharge instructions  As directed    Comments:     Non-weight bearing to touch down weight bearing if able.  Prescription for wheelchair in chart.  Please keep right leg in Cam boot at all times unless in CPM machine.  May shower on Monday, but do not soak incision.  May ice for up to 20 minutes at a time for pain and swelling.  Will continue with Aspirin 325 mg for blood clot prevention.   Do not put a pillow under the knee. Place it under the heel.  As directed    Comments:     Place gray foam under operative heel when in bed or in a chair to work on extension   Follow the hip precautions as taught in Physical Therapy  As directed    Comments:     Posterior total hip precautions.  Weight bearing as tolerated  Non weight bearing  As directed    Questions:     Laterality:     Extremity:     TED hose  As directed    Comments:     Use stockings (TED hose) for 2 weeks on both leg(s).  You may remove them at night for sleeping.   TED hose  As directed    Comments:     Use stockings (TED hose) for 3-4 weeks on both leg(s).  You may remove them at night for sleeping.   Touch down weight bearing  As directed    Scheduling Instructions:     If patient cannot do touch  down weight bearing, please start non-weight bearing   Questions:     Laterality:  right   Extremity:  Lower      Follow-up Information   Follow up with [facility], MD In 2 weeks.   Specialty:  Orthopedic Surgery   Contact information:   [address] [place] [address] [phone]        Signed: Gearldine Shown 07/14/2013, 7:56 AM

## 2013-07-15 LAB — BASIC METABOLIC PANEL
BUN: 43 mg/dL — ABNORMAL HIGH (ref 6–23)
CO2: 27 mEq/L (ref 19–32)
Calcium: 9.1 mg/dL (ref 8.4–10.5)
Chloride: 106 mEq/L (ref 96–112)
GFR calc non Af Amer: 44 mL/min — ABNORMAL LOW (ref >90)
Glucose, Bld: 97 mg/dL (ref 70–99)
Potassium: 4.3 mEq/L (ref 3.5–5.1)
Sodium: 139 mEq/L (ref 135–145)

## 2013-07-15 LAB — CBC
HCT: 23.7 % — ABNORMAL LOW (ref 36.0–46.0)
Hemoglobin: 8 g/dL — ABNORMAL LOW (ref 12.0–15.0)
MCH: 30.8 pg (ref 26.0–34.0)
MCV: 91.2 fL (ref 78.0–100.0)
Platelets: 187 10*3/uL (ref 150–400)
RBC: 2.6 MIL/uL — ABNORMAL LOW (ref 3.87–5.11)
WBC: 8 10*3/uL (ref 4.0–10.5)

## 2013-07-15 MED ORDER — HYDROCODONE-ACETAMINOPHEN 5-325 MG PO TABS: 1.0000 | ORAL_TABLET | Freq: Four times a day (QID) | ORAL | Status: DC | PRN

## 2013-07-15 NOTE — Progress Notes (Signed)
PRN MOM given to patient for bowel relief.  Patient has no s/s or c/o constipation, new facility requested pt have bowel relief before admission, also suggested enema.  Patient refused enema.  PRN pain medication given to patient before transport per pt request.

## 2013-07-15 NOTE — Progress Notes (Signed)
Report called to Inez at [facility].

## 2013-07-15 NOTE — Progress Notes (Signed)
Subjective: 3 Days Post-Op Procedure(s) (LRB): TOTAL KNEE ARTHROPLASTY WITH TIBIA REVISION COMPONENTS (Right) Patient reports pain as 4 on 0-10 scale.  Patient continues to improve daily.  No nausea/vomiting.  Voiding well and passing gas, but no bm as of yet.    Objective: Vital signs in last 24 hours: Temp:  [98.5 F (36.9 C)-99.2 F (37.3 C)] 99.2 F (37.3 C) (12/13 0550) Pulse Rate:  [67-70] 68 (12/13 0550) Resp:  [18] 18 (12/13 0550) BP: (114-139)/(65-92) 139/79 mmHg (12/13 0550) SpO2:  [95 %-98 %] 95 % (12/13 0550)  Intake/Output from previous day: 12/12 0701 - 12/13 0700 In: 240 [P.O.:240] Out: -  Intake/Output this shift: Total I/O In: 240 [P.O.:240] Out: -    Recent Labs  07/13/13 0410 07/14/13 0640 07/15/13 0453  HGB 8.8* 8.2* 8.0*    Recent Labs  07/14/13 0640 07/15/13 0453  WBC 9.1 8.0  RBC 2.66* 2.60*  HCT 23.8* 23.7*  PLT 176 187    Recent Labs  07/14/13 0640 07/15/13 0453  NA 137 139  K 4.4 4.3  CL 105 106  CO2 26 27  BUN 38* 43*  CREATININE 1.18* 1.21*  GLUCOSE 262* 97  CALCIUM 9.0 9.1   No results found for this basename: LABPT, INR,  in the last 72 hours  Neurologically intact ABD soft Neurovascular intact Sensation intact distally Intact pulses distally Dorsiflexion/Plantar flexion intact Compartment soft Negative Homan's  Assessment/Plan: 3 Days Post-Op Procedure(s) (LRB): TOTAL KNEE ARTHROPLASTY WITH TIBIA REVISION COMPONENTS (Right) Advance diet Up with therapy Discharge to SNF today Continue with current discharge instructions  ANTON, M. LINDSEY 07/15/2013, 10:11 AM

## 2013-07-15 NOTE — Progress Notes (Signed)
Administered 10am medications at 10:30am, computer locked up could not scan.

## 2013-07-15 NOTE — Progress Notes (Addendum)
Patient is medically stable for D/C to [facility] today. Clinical Child psychotherapist (CSW) prepared D/C packet and arranged non-emergency EMS Insurance risk surveyor) for transportation for patient to [facility]. Patient is aware of above and patient's daughter Holly Weiss was at bedside and is aware of above. Nursing is also aware of above. Please reconsult if further social work needs arise. CSW signing off.   Hendricks Milo Weekend CSW [phone]    CSW contacted patient's daughter Holly Weiss [phone] and made her aware of above.  Jetta Lout, LCSWA Weekend CSW [phone]

## 2013-07-15 NOTE — Progress Notes (Signed)
Physical Therapy Treatment Patient Details Name: Holly Weiss MRN: [id number] DOB: Oct 09, 1940 Today's Date: 07/15/2013 Time: 4098-1191 PT Time Calculation (min): 10 min  PT Assessment / Plan / Recommendation  History of Present Illness 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   PT Comments   Pt slowly progressing toward goals. Agreeable to exercise only today due to leaving for SNF this am.  Follow Up Recommendations  SNF;Supervision/Assistance - 24 hour     Equipment Recommendations  Rolling walker with 5" wheels       Frequency 7X/week   Progress towards PT Goals Progress towards PT goals: Progressing toward goals  Plan Current plan remains appropriate    Precautions / Restrictions Precautions Required Braces or Orthoses: Other Brace/Splint Other Brace/Splint: now has MD order for CAM boot to be on at all times. Restrictions RLE Weight Bearing: Non weight bearing       Mobility  Bed Mobility Bed Mobility: Not assessed    Exercises Total Joint Exercises Quad Sets: AROM;Strengthening;Both;10 reps;Supine Heel Slides: AAROM;Strengthening;Right;10 reps;Supine Hip ABduction/ADduction: AAROM;Strengthening;Right;10 reps;Supine Straight Leg Raises: AAROM;Strengthening;Right;10 reps;Supine     PT Goals (current goals can now be found in the care plan section) Acute Rehab PT Goals Patient Stated Goal: to get better PT Goal Formulation: With patient/family Time For Goal Achievement: 07/26/13 Potential to Achieve Goals: Good  Visit Information  Last PT Received On: 07/15/13 Assistance Needed: +2 History of Present Illness: 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    Subjective Data  Patient Stated Goal: to get better   Cognition  Cognition Arousal/Alertness: Awake/alert Behavior During Therapy: WFL for tasks assessed/performed Overall Cognitive Status: Within Functional Limits for tasks assessed       End of Session PT - End of Session Equipment Utilized During  Treatment: Gait belt Activity Tolerance: Patient tolerated treatment well Patient left: in bed;with family/visitor present;with call bell/phone within reach Nurse Communication: Mobility status;Patient requests pain meds   GP     Sallyanne Kuster 07/15/2013, 1:01 PM  Sallyanne Kuster, PTA Office- [phone]

## 2013-07-17 ENCOUNTER — Other Ambulatory Visit: Payer: Self-pay | Admitting: *Deleted

## 2013-07-17 MED ORDER — OXYCODONE-ACETAMINOPHEN 5-325 MG PO TABS: ORAL_TABLET | ORAL | Status: DC

## 2013-07-18 ENCOUNTER — Non-Acute Institutional Stay (SKILLED_NURSING_FACILITY): Payer: Medicare Other | Admitting: Adult Health

## 2013-07-18 ENCOUNTER — Encounter: Payer: Self-pay | Admitting: Adult Health

## 2013-07-18 DIAGNOSIS — M1710 Unilateral primary osteoarthritis, unspecified knee: Secondary | ICD-10-CM

## 2013-07-18 DIAGNOSIS — I509 Heart failure, unspecified: Secondary | ICD-10-CM

## 2013-07-18 DIAGNOSIS — J45909 Unspecified asthma, uncomplicated: Secondary | ICD-10-CM

## 2013-07-18 DIAGNOSIS — M1711 Unilateral primary osteoarthritis, right knee: Secondary | ICD-10-CM

## 2013-07-18 DIAGNOSIS — F32A Depression, unspecified: Secondary | ICD-10-CM

## 2013-07-18 DIAGNOSIS — IMO0001 Reserved for inherently not codable concepts without codable children: Secondary | ICD-10-CM

## 2013-07-18 DIAGNOSIS — F329 Major depressive disorder, single episode, unspecified: Secondary | ICD-10-CM

## 2013-07-18 DIAGNOSIS — E785 Hyperlipidemia, unspecified: Secondary | ICD-10-CM

## 2013-07-19 NOTE — Progress Notes (Signed)
Patient ID: Holly Weiss, female   DOB: November 09, 1940, 73 y.o.   MRN: [id number]     [facility]  No Known Allergies   Chief Complaint  Patient presents with  . Hospitalization Follow-up    HPI:  She has been hospitalized for for a right total knee with tibia revision component. she has a  Right tibia fracture. She is here for short term  With her goal to her return back home.   Past Medical History  Diagnosis Date  . ANEMIA-NOS 01/18/2008  . 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 ASTHMA, WITH ACUTE EXACERBATION 11/29/2008  . Chronic pain syndrome 01/21/2010  . DIABETIC  RETINOPATHY 05/30/2007  . DM, UNCOMPLICATED, TYPE II, UNCONTROLLED 05/30/2007  . 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 HYPERLIPIDEMIA 05/30/2007  . HYPERTENSION 10/31/2007  . LOW BACK PAIN 01/18/2008  . MORBID OBESITY, HX OF 05/30/2007  . NEPHROLITHIASIS, HX OF 01/18/2008  . SHOULDER PAIN, BILATERAL 04/09/2009  . Migraine headache   . Spinal stenosis   . Bilateral carpal tunnel syndrome   . Shortness of breath     with walking  . Urgency of urination   . Arthritis   . Edema of both legs     takes Lasix  . Numbness and tingling in hands     Past Surgical History  Procedure Laterality Date  . Cataract extraction    . Cholecystectomy    . Thoracic fusion    . Lumbar disc surgury    . Tubal ligation    . Back surgery    . Total knee arthroplasty with revision components Right 07/12/2013    Procedure: TOTAL KNEE ARTHROPLASTY WITH TIBIA REVISION COMPONENTS;  Surgeon: Loreta Ave, MD;  Location: [facility] OR;  Service: Orthopedics;  Laterality: Right;    VITAL SIGNS BP 138/69  Pulse 78  Ht 5' (1.524 m)  Wt 199 lb (90.266 kg)  BMI 38.86 kg/m2   Patient's Medications  New Prescriptions   No medications on file  Previous Medications   ASPIRIN EC 325 MG EC TABLET    Take 1 tablet (325 mg total) by mouth daily with breakfast.   CITALOPRAM (CELEXA) 10 MG TABLET    Take 1 tablet (10 mg total) by mouth daily.   CRESTOR 20  MG TABLET    TAKE ONE-HALF TABLET DAILY   CYCLOBENZAPRINE (FLEXERIL) 10 MG TABLET    Take 10 mg by mouth 3 (three) times daily as needed for muscle spasms.   FUROSEMIDE (LASIX) 40 MG TABLET    Take 40 mg by mouth daily.    INSULIN GLARGINE (LANTUS) 100 UNIT/ML INJECTION    Inject 37 Units into the skin daily.   INSULIN LISPRO (HUMALOG) 100 UNIT/ML INJECTION    Inject 5 Units into the skin 3 (three) times daily with meals. For cbg >=150   MULTIPLE VITAMINS-MINERALS (ONE-A-DAY EXTRAS ANTIOXIDANT PO)    Take by mouth daily.     NAPROXEN SODIUM (ANAPROX) 220 MG TABLET    Take 220 mg by mouth 2 (two) times daily as needed. For pain   OXYCODONE-ACETAMINOPHEN (ROXICET) 5-325 MG PER TABLET    Take one tablet by mouth every 4 hours as needed for moderate pain; Take two tablets by mouth every 4 hours as needed for severe pain   QVAR 80 MCG/ACT INHALER    TAKE 1 PUFF TWICE A DAY  Modified Medications   No medications on file  Discontinued Medications   HYDROCODONE-ACETAMINOPHEN (NORCO) 5-325 MG PER TABLET    Take  1 tablet by mouth every 6 (six) hours as needed for moderate pain.   INSULIN ASPART (NOVOLOG) 100 UNIT/ML INJECTION    Inject 15 Units into the skin daily.     SIGNIFICANT DIAGNOSTIC EXAMS  07-05-13: echo dobutamine stress: No chest pain. No wall motion abnormalities at rest or with dobutamine stress. The patient was not able to achieve target heart rate even at maximal dose of dobutamine. Atropine not given because of glaucoma.  Impression: No evidence of ischemia seen on this submaximal dobutamine stress test.  07-07-13: chest x-ray: 1. Persistent findings of cardiomegaly and pulmonary venous congestion without frank evidence of edema. 2. Improved aeration of the lungs with persistent right basilar opacities favored to represent atelectasis.  07-12-13: right knee x-ray: The patient has sustained a fracture through the cortex of the proximal tibial shaft adjacent to the inferior aspect of the  tibial component of the prosthesis.  07-12-13: right tibia fibula x-ray: Lateral cortical tibial fracture at and below the level of the prosthesis.    LABS REVIEWED;   07-07-13: wbc 5.9; hgb 11.2; hct 33.7; mcv 91.3; plt 211;glucose 82; bun 26; creat 1.08; k+4.4;na++141 liver normal albumin 3.7 07-12-13: hgb a1c 6.9 07-15-13: wbc 8.9; hgb 8.0; hct 23.7; mcv 91.plt 187; glcose 97; bun 43; creat 1.21; k+4.3; na+139 07-17-13: wbc 9.1; hgb 7.9; hct 25.0; mcv 92.9; plt 230; glucose 194; bun 28; creat 1.1; k+ 4.4; na++136     Review of Systems  Constitutional: Negative for malaise/fatigue.  Eyes: Negative for blurred vision.  Respiratory: Negative for cough and shortness of breath.   Cardiovascular: Negative for chest pain, palpitations and leg swelling.  Gastrointestinal: Negative for heartburn, abdominal pain and constipation.  Musculoskeletal: Negative for back pain, joint pain and myalgias.  Skin: Negative.   Neurological: Negative for dizziness and headaches.  Psychiatric/Behavioral: Negative for depression. The patient is not nervous/anxious.     Physical Exam  Constitutional: She is oriented to person, place, and time. She appears well-developed and well-nourished. No distress.  obese  Neck: Neck supple. No JVD present.  Cardiovascular: Normal rate, regular rhythm and intact distal pulses.   Respiratory: Effort normal and breath sounds normal. No respiratory distress.  GI: Soft. Bowel sounds are normal. She exhibits no distension. There is no tenderness.  Musculoskeletal: She exhibits no edema.  Right lower extremity in a camboot  Neurological: She is alert and oriented to person, place, and time.  Skin: Skin is warm and dry. She is not diaphoretic.  Incision line without signs of infection present.   Psychiatric: She has a normal mood and affect.     ASSESSMENT/ PLAN:  1. Osteoarthritis: will continue therapy as directed; will use camboot as directed by ortho and will  continue to monitor her status will continue percocet 5/325 mg 1-2 tabs every 4 hours as needed and has anaprox 220 mg twice daily as needed and flexeril 10 mg three times daily  2. Diabetes: will continue her lantus 37 units daily and humalog 5 units prior to meals for cbg >= 150   3. Chf: is stable will continue lasix 40 mg daily  Asa 325 mg daily  4. Dyslipidemia: will continue crestor 10 mg daily  5. Depression: will continue celexa 10 mg daily  6. Asthma: will continue QVar 1 puff twice daily    Time spent with patient 50 minutes.

## 2013-07-20 ENCOUNTER — Non-Acute Institutional Stay (SKILLED_NURSING_FACILITY): Payer: Medicare Other | Admitting: Internal Medicine

## 2013-07-20 DIAGNOSIS — J45909 Unspecified asthma, uncomplicated: Secondary | ICD-10-CM

## 2013-07-20 DIAGNOSIS — M1710 Unilateral primary osteoarthritis, unspecified knee: Secondary | ICD-10-CM

## 2013-07-20 DIAGNOSIS — M1711 Unilateral primary osteoarthritis, right knee: Secondary | ICD-10-CM

## 2013-07-20 DIAGNOSIS — I509 Heart failure, unspecified: Secondary | ICD-10-CM

## 2013-07-20 DIAGNOSIS — I10 Essential (primary) hypertension: Secondary | ICD-10-CM

## 2013-07-26 NOTE — Progress Notes (Signed)
Patient ID: Holly Weiss, female   DOB: 1941-08-02, 72 y.o.   MRN: [id number]    [facility]    PCP: No PCP Per Patient  Code Status: full code  No Known Allergies  Chief Complaint: new admit  HPI:  73 y/o female patient is here for STR after undergoing total knee arthroplasty for severe degenerative joint disease in right knee. She is seen in her room and is in no acute distress. Her pain is currently under control.   Review of Systems  Constitutional: Negative for fever, chills, weight loss, malaise/fatigue and diaphoresis.  HENT: Negative for congestion, hearing loss and sore throat.   Eyes: Negative for blurred vision, double vision and discharge.  Respiratory: Negative for cough, sputum production, shortness of breath and wheezing.   Cardiovascular: Negative for chest pain, palpitations, orthopnea and leg swelling.  Gastrointestinal: Negative for heartburn, nausea, vomiting, abdominal pain, diarrhea and constipation.  Genitourinary: Negative for dysuria, urgency, frequency and flank pain.  Musculoskeletal: Negative for back pain, falls and myalgias.  Skin: Negative for itching and rash.  Neurological: Positive for weakness. Negative for dizziness, tingling, focal weakness and headaches.  Psychiatric/Behavioral: Negative for depression and memory loss. The patient is not nervous/anxious.     Past Medical History  Diagnosis Date  . ANEMIA-NOS 01/18/2008  . 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 ASTHMA, WITH ACUTE EXACERBATION 11/29/2008  . Chronic pain syndrome 01/21/2010  . DIABETIC  RETINOPATHY 05/30/2007  . DM, UNCOMPLICATED, TYPE II, UNCONTROLLED 05/30/2007  . 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 HYPERLIPIDEMIA 05/30/2007  . HYPERTENSION 10/31/2007  . LOW BACK PAIN 01/18/2008  . MORBID OBESITY, HX OF 05/30/2007  . NEPHROLITHIASIS, HX OF 01/18/2008  . SHOULDER PAIN, BILATERAL 04/09/2009  . Migraine headache   . Spinal stenosis   . Bilateral carpal tunnel syndrome   .  Shortness of breath     with walking  . Urgency of urination   . Arthritis   . Edema of both legs     takes Lasix  . Numbness and tingling in hands    Past Surgical History  Procedure Laterality Date  . Cataract extraction    . Cholecystectomy    . Thoracic fusion    . Lumbar disc surgury    . Tubal ligation    . Back surgery    . Total knee arthroplasty with revision components Right 07/12/2013    Procedure: TOTAL KNEE ARTHROPLASTY WITH TIBIA REVISION COMPONENTS;  Surgeon: Loreta Ave, MD;  Location: [facility] OR;  Service: Orthopedics;  Laterality: Right;   Social History:   reports that she has never smoked. She does not have any smokeless tobacco history on file. She reports that she does not drink alcohol or use illicit drugs.  Family History  Problem Relation Age of Onset  . Diabetes Mother   . Heart disease Mother   . Lung cancer Father   . Heart disease Father   . Diabetes Sister     Medications: Patient's Medications  New Prescriptions   No medications on file  Previous Medications   ASPIRIN EC 325 MG EC TABLET    Take 1 tablet (325 mg total) by mouth daily with breakfast.   CITALOPRAM (CELEXA) 10 MG TABLET    Take 1 tablet (10 mg total) by mouth daily.   CRESTOR 20 MG TABLET    TAKE ONE-HALF TABLET DAILY   CYCLOBENZAPRINE (FLEXERIL) 10 MG TABLET    Take 10 mg by mouth 3 (three) times daily as  needed for muscle spasms.   FUROSEMIDE (LASIX) 40 MG TABLET    Take 40 mg by mouth daily.    INSULIN GLARGINE (LANTUS) 100 UNIT/ML INJECTION    Inject 37 Units into the skin daily.   INSULIN LISPRO (HUMALOG) 100 UNIT/ML INJECTION    Inject 5 Units into the skin 3 (three) times daily with meals. For cbg >=150   MULTIPLE VITAMINS-MINERALS (ONE-A-DAY EXTRAS ANTIOXIDANT PO)    Take by mouth daily.     NAPROXEN SODIUM (ANAPROX) 220 MG TABLET    Take 220 mg by mouth 2 (two) times daily as needed. For pain   OXYCODONE-ACETAMINOPHEN (ROXICET) 5-325 MG PER TABLET    Take one tablet by  mouth every 4 hours as needed for moderate pain; Take two tablets by mouth every 4 hours as needed for severe pain   QVAR 80 MCG/ACT INHALER    TAKE 1 PUFF TWICE A DAY  Modified Medications   No medications on file  Discontinued Medications   No medications on file     Physical Exam: Filed Vitals:   07/20/13 1239  BP: 117/50  Pulse: 64  Temp: 98 F (36.7 C)  Resp: 18  SpO2: 98%   Constitutional: She is oriented to person, place, and time. She appears well-developed and well-nourished. No distress.  obese  Neck: Neck supple. No JVD present.  Cardiovascular: Normal rate, regular rhythm and intact distal pulses.   Respiratory: Effort normal and breath sounds normal. No respiratory distress.  GI: Soft. Bowel sounds are normal. She exhibits no distension. There is no tenderness.  Musculoskeletal: She exhibits no edema.  Right lower extremity in a camboot  Neurological: She is alert and oriented to person, place, and time.  Skin: Skin is warm and dry. She is not diaphoretic.  Incision line without signs of infection   Psychiatric: She has a normal mood and affect.    Labs reviewed: Basic Metabolic Panel:  Recent Labs  78/29/56 0410 07/14/13 0640 07/15/13 0453  NA 135 137 139  K 4.8 4.4 4.3  CL 103 105 106  CO2 26 26 27   GLUCOSE 251* 262* 97  BUN 28* 38* 43*  CREATININE 1.05 1.18* 1.21*  CALCIUM 8.9 9.0 9.1   Liver Function Tests:  Recent Labs  02/14/13 1538 07/07/13 1051  AST 26 23  ALT 27 17  ALKPHOS 59 65  BILITOT 0.5 0.2*  PROT 7.5 7.4  ALBUMIN 3.6 3.7   No results found for this basename: LIPASE, AMYLASE,  in the last 8760 hours No results found for this basename: AMMONIA,  in the last 8760 hours CBC:  Recent Labs  01/30/13 1200 02/14/13 1538 07/07/13 1051 07/13/13 0410 07/14/13 0640 07/15/13 0453  WBC 5.7 5.4 5.9 7.0 9.1 8.0  NEUTROABS 2.8 3.0 3.0  --   --   --   HGB 10.4* 10.7* 11.2* 8.8* 8.2* 8.0*  HCT 30.7* 32.1* 33.7* 25.5* 23.8* 23.7*    MCV 88.5 92.2 91.3 90.1 89.5 91.2  PLT 206 224.0 211 184 176 187   Cardiac Enzymes: No results found for this basename: CKTOTAL, CKMB, CKMBINDEX, TROPONINI,  in the last 8760 hours BNP: No components found with this basename: POCBNP,  CBG:  Recent Labs  07/14/13 1657 07/14/13 2127 07/15/13 0630  GLUCAP 137* 136* 94    Radiological Exams:  07-05-13: echo dobutamine stress: No chest pain. No wall motion abnormalities at rest or with dobutamine stress. The patient was not able to achieve target heart rate even  at maximal dose of dobutamine. Atropine not given because of glaucoma.  Impression: No evidence of ischemia seen on this submaximal dobutamine stress test.  07-07-13: chest x-ray: 1. Persistent findings of cardiomegaly and pulmonary venous congestion without frank evidence of edema. 2. Improved aeration of the lungs with persistent right basilar opacities favored to represent atelectasis.  07-12-13: right knee x-ray: The patient has sustained a fracture through the cortex of the proximal tibial shaft adjacent to the inferior aspect of the tibial component of the prosthesis.  07-12-13: right tibia fibula x-ray: Lateral cortical tibial fracture at and below the level of the prosthesis.    Assessment/Plan  Osteoarthritis- s/p right TKA. To continue therapy. Fall precautions. Skin care and has follow up with orthopedic. Pain under control. Thus continue  percocet 5/325 mg 1-2 tabs every 4 hours as needed and anaprox 220 mg twice daily as needed for pain. Continue flexeril 10 mg three times daily for her muscle spasm  Depression- stable at present. continue celexa 10 mg daily  Asthma- breathing remains stable. Continue qvar as prescribed   Diabetes mellitus- continue her lantus 37 units daily and humalog 5 units prior to meals for cbg >= 150   Chf- monitor weight and salt intake, continue lasix 40 mg daily with aspirin 325 mg daily  Dyslipidemia-  continue crestor 10 mg  daily  Family/ staff Communication: reviewed care plan with patient and nursing supervisor   Goals of care: to return home   Labs/tests ordered- cbc, bmp

## 2013-08-01 ENCOUNTER — Non-Acute Institutional Stay (SKILLED_NURSING_FACILITY): Payer: Medicare Other | Admitting: Adult Health

## 2013-08-01 DIAGNOSIS — I10 Essential (primary) hypertension: Secondary | ICD-10-CM

## 2013-08-01 DIAGNOSIS — M1710 Unilateral primary osteoarthritis, unspecified knee: Secondary | ICD-10-CM

## 2013-08-01 DIAGNOSIS — G894 Chronic pain syndrome: Secondary | ICD-10-CM

## 2013-08-01 DIAGNOSIS — M1711 Unilateral primary osteoarthritis, right knee: Secondary | ICD-10-CM

## 2013-08-03 ENCOUNTER — Encounter: Payer: Self-pay | Admitting: Adult Health

## 2013-08-03 NOTE — Progress Notes (Signed)
Patient ID: Holly Weiss, female   DOB: May 19, 1941, 73 y.o.   MRN: [id number]     [facility]  No Known Allergies   Chief Complaint  Patient presents with  . Discharge Note    HPI: She is being discharged to home with home health for pt/ot/nursing. She will need a wheelchair in order for her to maintain to level of independence with her adl's. She will need a 3-1 commode and tub bench. She will need prescriptions to be written.   Past Medical History  Diagnosis Date  . ANEMIA-NOS 01/18/2008  . 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 ASTHMA, WITH ACUTE EXACERBATION 11/29/2008  . Chronic pain syndrome 01/21/2010  . DIABETIC  RETINOPATHY 05/30/2007  . DM, UNCOMPLICATED, TYPE II, UNCONTROLLED 05/30/2007  . 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 HYPERLIPIDEMIA 05/30/2007  . HYPERTENSION 10/31/2007  . LOW BACK PAIN 01/18/2008  . MORBID OBESITY, HX OF 05/30/2007  . NEPHROLITHIASIS, HX OF 01/18/2008  . SHOULDER PAIN, BILATERAL 04/09/2009  . Migraine headache   . Spinal stenosis   . Bilateral carpal tunnel syndrome   . Shortness of breath     with walking  . Urgency of urination   . Arthritis   . Edema of both legs     takes Lasix  . Numbness and tingling in hands     Past Surgical History  Procedure Laterality Date  . Cataract extraction    . Cholecystectomy    . Thoracic fusion    . Lumbar disc surgury    . Tubal ligation    . Back surgery    . Total knee arthroplasty with revision components Right 07/12/2013    Procedure: TOTAL KNEE ARTHROPLASTY WITH TIBIA REVISION COMPONENTS;  Surgeon: Ninetta Lights, MD;  Location: House;  Service: Orthopedics;  Laterality: Right;    VITAL SIGNS BP 120/69  Pulse 71  Ht 5' (1.524 m)  Wt 208 lb (94.348 kg)  BMI 40.62 kg/m2   Patient's Medications  New Prescriptions   No medications on file  Previous Medications   ASPIRIN EC 325 MG EC TABLET    Take 1 tablet (325 mg total) by mouth daily with breakfast.   CITALOPRAM (CELEXA) 10 MG TABLET     Take 1 tablet (10 mg total) by mouth daily.   CRESTOR 20 MG TABLET    TAKE ONE-HALF TABLET DAILY   CYCLOBENZAPRINE (FLEXERIL) 10 MG TABLET    Take 10 mg by mouth 3 (three) times daily as needed for muscle spasms.   FUROSEMIDE (LASIX) 40 MG TABLET    Take 40 mg by mouth daily.    INSULIN GLARGINE (LANTUS) 100 UNIT/ML INJECTION    Inject 37 Units into the skin daily.   INSULIN LISPRO (HUMALOG) 100 UNIT/ML INJECTION    Inject 5 Units into the skin 3 (three) times daily with meals. For cbg >=150   MULTIPLE VITAMINS-MINERALS (ONE-A-DAY EXTRAS ANTIOXIDANT PO)    Take by mouth daily.     NAPROXEN SODIUM (ANAPROX) 220 MG TABLET    Take 220 mg by mouth 2 (two) times daily as needed. For pain   OXYCODONE-ACETAMINOPHEN (ROXICET) 5-325 MG PER TABLET    Take one tablet by mouth every 4 hours as needed for moderate pain; Take two tablets by mouth every 4 hours as needed for severe pain   QVAR 80 MCG/ACT INHALER    TAKE 1 PUFF TWICE A DAY  Modified Medications   No medications on file  Discontinued Medications   No medications  on file    SIGNIFICANT DIAGNOSTIC EXAMS  07-05-13: echo dobutamine stress: No chest pain. No wall motion abnormalities at rest or with dobutamine stress. The patient was not able to achieve target heart rate even at maximal dose of dobutamine. Atropine not given because of glaucoma.  Impression: No evidence of ischemia seen on this submaximal dobutamine stress test.  07-07-13: chest x-ray: 1. Persistent findings of cardiomegaly and pulmonary venous congestion without frank evidence of edema. 2. Improved aeration of the lungs with persistent right basilar opacities favored to represent atelectasis.  07-12-13: right knee x-ray: The patient has sustained a fracture through the cortex of the proximal tibial shaft adjacent to the inferior aspect of the tibial component of the prosthesis.  07-12-13: right tibia fibula x-ray: Lateral cortical tibial fracture at and below the level of  the prosthesis.    LABS REVIEWED;   07-07-13: wbc 5.9; hgb 11.2; hct 33.7; mcv 91.3; plt 211;glucose 82; bun 26; creat 1.08; k+4.4;na++141 liver normal albumin 3.7 07-12-13: hgb a1c 6.9 07-15-13: wbc 8.9; hgb 8.0; hct 23.7; mcv 91.plt 187; glcose 97; bun 43; creat 1.21; k+4.3; na+139 07-17-13: wbc 9.1; hgb 7.9; hct 25.0; mcv 92.9; plt 230; glucose 194; bun 28; creat 1.1; k+ 4.4; na++136     Review of Systems  Constitutional: Negative for malaise/fatigue.  Eyes: Negative for blurred vision.  Respiratory: Negative for cough and shortness of breath.   Cardiovascular: Negative for chest pain, palpitations and leg swelling.  Gastrointestinal: Negative for heartburn, abdominal pain and constipation.  Musculoskeletal: Negative for back pain, joint pain and myalgias.  Skin: Negative.   Neurological: Negative for dizziness and headaches.  Psychiatric/Behavioral: Negative for depression. The patient is not nervous/anxious.     Physical Exam  Constitutional: She is oriented to person, place, and time. She appears well-developed and well-nourished. No distress.  obese  Neck: Neck supple. No JVD present.  Cardiovascular: Normal rate, regular rhythm and intact distal pulses.   Respiratory: Effort normal and breath sounds normal. No respiratory distress.  GI: Soft. Bowel sounds are normal. She exhibits no distension. There is no tenderness.  Musculoskeletal: She exhibits no edema.  Right lower extremity in a camboot  Neurological: She is alert and oriented to person, place, and time.  Skin: Skin is warm and dry. She is not diaphoretic.  Incision line without signs of infection present.   Psychiatric: She has a normal mood and affect.     ASSESSMENT/ PLAN:  Will discharge to home with home health for pt/ot/nursing; will need a wheelchair; 3-1 bsc and tub bench for dme; prescriptions have been written.   Time spent with patient 40 minutes.

## 2013-08-06 DIAGNOSIS — Z471 Aftercare following joint replacement surgery: Secondary | ICD-10-CM

## 2013-08-06 DIAGNOSIS — E1165 Type 2 diabetes mellitus with hyperglycemia: Secondary | ICD-10-CM

## 2013-08-06 DIAGNOSIS — Z96659 Presence of unspecified artificial knee joint: Secondary | ICD-10-CM

## 2013-08-06 DIAGNOSIS — IMO0001 Reserved for inherently not codable concepts without codable children: Secondary | ICD-10-CM

## 2013-08-06 DIAGNOSIS — I10 Essential (primary) hypertension: Secondary | ICD-10-CM

## 2013-08-08 ENCOUNTER — Telehealth: Payer: Self-pay | Admitting: General Practice

## 2013-08-08 MED ORDER — OXYCODONE-ACETAMINOPHEN 5-325 MG PO TABS: ORAL_TABLET | ORAL | Status: DC

## 2013-08-08 NOTE — Telephone Encounter (Signed)
Pt request refill for oxycodone. Pt stated that she is out of this med and pt has an appt with Dr. Jenny Reichmann 08/10/12. Please advise

## 2013-08-08 NOTE — Telephone Encounter (Signed)
Done hardcopy to robin  Also please remind pt that we did give letter nov 2014 that I would no longer be able to provide this after feb 1  I can do a tapering off program for the month after this one if she is wanting to try to stop her med, or she will need to find alternative provider for this

## 2013-08-09 NOTE — Telephone Encounter (Signed)
Rx ready, pt aware.

## 2013-08-10 ENCOUNTER — Ambulatory Visit (INDEPENDENT_AMBULATORY_CARE_PROVIDER_SITE_OTHER): Payer: Medicare Other | Admitting: Internal Medicine

## 2013-08-10 VITALS — BP 124/60 | HR 64 | Temp 97.6°F | Ht 60.0 in

## 2013-08-10 DIAGNOSIS — I10 Essential (primary) hypertension: Secondary | ICD-10-CM

## 2013-08-10 DIAGNOSIS — E1165 Type 2 diabetes mellitus with hyperglycemia: Principal | ICD-10-CM

## 2013-08-10 DIAGNOSIS — IMO0001 Reserved for inherently not codable concepts without codable children: Secondary | ICD-10-CM

## 2013-08-10 DIAGNOSIS — E785 Hyperlipidemia, unspecified: Secondary | ICD-10-CM

## 2013-08-10 MED ORDER — OXYCODONE-ACETAMINOPHEN 5-325 MG PO TABS: ORAL_TABLET | ORAL | Status: DC

## 2013-08-10 NOTE — Progress Notes (Signed)
Subjective:    Patient ID: Holly Weiss, female    DOB: March 06, 1941, 73 y.o.   MRN: [id number]  HPI   Here to f/u after recent right TKR, tolerated well, glc higher post op, tx with SSI contd post d/c but now home after rehab stay; Declines to take the humalog since a1c was 6.9 preop.  Pt denies chest pain, increased sob or doe, wheezing, orthopnea, PND, increased LE swelling, palpitations, dizziness or syncope.  Pt denies polydipsia, polyuria, or low sugar symptoms such as weakness or confusion improved with po intake.  Pt denies new neurological symptoms such as new headache, or facial or extremity weakness or numbness.   Pt states overall good compliance with meds, has been trying to follow lower cholesterol, diabetic diet, with wt overall stable.   S/p right knee TKR dec 10, in immobilizer , for ortho f/u jan 20.  Needs short term pain med management only Past Medical History  Diagnosis Date  . ANEMIA-NOS 01/18/2008  . 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 ASTHMA, WITH ACUTE EXACERBATION 11/29/2008  . Chronic pain syndrome 01/21/2010  . DIABETIC  RETINOPATHY 05/30/2007  . DM, UNCOMPLICATED, TYPE II, UNCONTROLLED 05/30/2007  . 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 HYPERLIPIDEMIA 05/30/2007  . HYPERTENSION 10/31/2007  . LOW BACK PAIN 01/18/2008  . MORBID OBESITY, HX OF 05/30/2007  . NEPHROLITHIASIS, HX OF 01/18/2008  . SHOULDER PAIN, BILATERAL 04/09/2009  . Migraine headache   . Spinal stenosis   . Bilateral carpal tunnel syndrome   . Shortness of breath     with walking  . Urgency of urination   . Arthritis   . Edema of both legs     takes Lasix  . Numbness and tingling in hands    Past Surgical History  Procedure Laterality Date  . Cataract extraction    . Cholecystectomy    . Thoracic fusion    . Lumbar disc surgury    . Tubal ligation    . Back surgery    . Total knee arthroplasty with revision components Right 07/12/2013    Procedure: TOTAL KNEE ARTHROPLASTY WITH TIBIA REVISION  COMPONENTS;  Surgeon: Ninetta Lights, MD;  Location: [facility];  Service: Orthopedics;  Laterality: Right;    reports that she has never smoked. She does not have any smokeless tobacco history on file. She reports that she does not drink alcohol or use illicit drugs. family history includes Diabetes in her mother and sister; Heart disease in her father and mother; Lung cancer in her father. No Known Allergies Current Outpatient Prescriptions on File Prior to Visit  Medication Sig Dispense Refill  . aspirin EC 325 MG EC tablet Take 1 tablet (325 mg total) by mouth daily with breakfast.  30 tablet  0  . citalopram (CELEXA) 10 MG tablet Take 1 tablet (10 mg total) by mouth daily.  90 tablet  3  . CRESTOR 20 MG tablet TAKE ONE-HALF TABLET DAILY  30 tablet  3  . cyclobenzaprine (FLEXERIL) 10 MG tablet Take 10 mg by mouth 3 (three) times daily as needed for muscle spasms.      . furosemide (LASIX) 40 MG tablet Take 40 mg by mouth daily.       . insulin glargine (LANTUS) 100 UNIT/ML injection Inject 37 Units into the skin daily.      . Multiple Vitamins-Minerals (ONE-A-DAY EXTRAS ANTIOXIDANT PO) Take by mouth daily.        . naproxen sodium (ANAPROX) 220 MG tablet Take 220  mg by mouth 2 (two) times daily as needed. For pain      . QVAR 80 MCG/ACT inhaler TAKE 1 PUFF TWICE A DAY  8.7 g  0   No current facility-administered medications on file prior to visit.   Review of Systems  Constitutional: Negative for unexpected weight change, or unusual diaphoresis  HENT: Negative for tinnitus.   Eyes: Negative for photophobia and visual disturbance.  Respiratory: Negative for choking and stridor.   Gastrointestinal: Negative for vomiting and blood in stool.  Genitourinary: Negative for hematuria and decreased urine volume.  Musculoskeletal: Negative for acute joint swelling Skin: Negative for color change and wound.  Neurological: Negative for tremors and numbness other than noted  Psychiatric/Behavioral:  Negative for decreased concentration or  hyperactivity.       Objective:   Physical Exam BP 124/60  Pulse 64  Temp(Src) 97.6 F (36.4 C) (Oral)  Ht 5' (1.524 m)  SpO2 98% VS noted, not ill appearing Constitutional: Pt appears well-developed and well-nourished.  HENT: Head: NCAT.  Right Ear: External ear normal.  Left Ear: External ear normal.  Eyes: Conjunctivae and EOM are normal. Pupils are equal, round, and reactive to light.  Neck: Normal range of motion. Neck supple.  Cardiovascular: Normal rate and regular rhythm.   Pulmonary/Chest: Effort normal and breath sounds normal.  Abd:  Soft, NT, non-distended, + BS Neurological: Pt is alert. Not confused  Skin: Skin is warm. No erythema.  Psychiatric: Pt behavior is normal. Thought content normal.     Assessment & Plan:

## 2013-08-10 NOTE — Patient Instructions (Signed)
Please continue all other medications as before, and refills have been done if requested. Please have the pharmacy call with any other refills you may need.  Please remember to sign up for My Chart if you have not done so, as this will be important to you in the future with finding out test results, communicating by private email, and scheduling acute appointments online when needed.

## 2013-08-10 NOTE — Progress Notes (Signed)
Pre visit review using our clinic review tool, if applicable. No additional management support is needed unless otherwise documented below in the visit note. 

## 2013-08-13 ENCOUNTER — Encounter: Payer: Self-pay | Admitting: Internal Medicine

## 2013-08-13 NOTE — Assessment & Plan Note (Signed)
stable overall by history and exam, recent data reviewed with pt, and pt to continue medical treatment as before,  to f/u any worsening symptoms or concerns Lab Results  Component Value Date   LDLCALC 72 07/21/2011    

## 2013-08-13 NOTE — Assessment & Plan Note (Signed)
stable overall by history and exam, recent data reviewed with pt, and pt to continue medical treatment as before,  to f/u any worsening symptoms or concerns Lab Results  Component Value Date   HGBA1C 6.9* 07/12/2013

## 2013-08-13 NOTE — Assessment & Plan Note (Signed)
stable overall by history and exam, recent data reviewed with pt, and pt to continue medical treatment as before,  to f/u any worsening symptoms or concerns BP Readings from Last 3 Encounters:  08/10/13 124/60  08/01/13 120/69  07/20/13 117/50

## 2013-08-30 ENCOUNTER — Ambulatory Visit (INDEPENDENT_AMBULATORY_CARE_PROVIDER_SITE_OTHER): Payer: Medicare Other | Admitting: Internal Medicine

## 2013-08-30 DIAGNOSIS — H5316 Psychophysical visual disturbances: Secondary | ICD-10-CM

## 2013-08-31 NOTE — Patient Instructions (Signed)
Pt not seen.

## 2013-08-31 NOTE — Assessment & Plan Note (Signed)
Pt not seen today, error

## 2013-08-31 NOTE — Progress Notes (Signed)
Pt not seen today

## 2013-09-22 ENCOUNTER — Other Ambulatory Visit: Payer: Self-pay | Admitting: Adult Health

## 2013-09-22 ENCOUNTER — Other Ambulatory Visit: Payer: Self-pay | Admitting: Internal Medicine

## 2013-09-23 ENCOUNTER — Other Ambulatory Visit: Payer: Self-pay | Admitting: Adult Health

## 2013-10-16 ENCOUNTER — Other Ambulatory Visit: Payer: Self-pay | Admitting: Internal Medicine

## 2013-10-19 ENCOUNTER — Emergency Department (HOSPITAL_COMMUNITY): Payer: Medicare Other

## 2013-10-19 ENCOUNTER — Emergency Department (HOSPITAL_COMMUNITY)
Admission: EM | Admit: 2013-10-19 | Discharge: 2013-10-20 | Disposition: A | Discharge status: Home / Self Care | Payer: Medicare Other | Attending: Emergency Medicine | Admitting: Emergency Medicine

## 2013-10-19 ENCOUNTER — Encounter (HOSPITAL_COMMUNITY): Payer: Self-pay | Admitting: Emergency Medicine

## 2013-10-19 DIAGNOSIS — Z862 Personal history of diseases of the blood and blood-forming organs and certain disorders involving the immune mechanism: Secondary | ICD-10-CM | POA: Insufficient documentation

## 2013-10-19 DIAGNOSIS — Z8739 Personal history of other diseases of the musculoskeletal system and connective tissue: Secondary | ICD-10-CM | POA: Insufficient documentation

## 2013-10-19 DIAGNOSIS — M129 Arthropathy, unspecified: Secondary | ICD-10-CM | POA: Insufficient documentation

## 2013-10-19 DIAGNOSIS — Z87442 Personal history of urinary calculi: Secondary | ICD-10-CM | POA: Insufficient documentation

## 2013-10-19 DIAGNOSIS — F329 Major depressive disorder, single episode, unspecified: Secondary | ICD-10-CM

## 2013-10-19 DIAGNOSIS — Z794 Long term (current) use of insulin: Secondary | ICD-10-CM | POA: Insufficient documentation

## 2013-10-19 DIAGNOSIS — Z79899 Other long term (current) drug therapy: Secondary | ICD-10-CM | POA: Insufficient documentation

## 2013-10-19 DIAGNOSIS — R609 Edema, unspecified: Secondary | ICD-10-CM | POA: Insufficient documentation

## 2013-10-19 DIAGNOSIS — F22 Delusional disorders: Secondary | ICD-10-CM | POA: Insufficient documentation

## 2013-10-19 DIAGNOSIS — E119 Type 2 diabetes mellitus without complications: Secondary | ICD-10-CM | POA: Insufficient documentation

## 2013-10-19 DIAGNOSIS — F29 Unspecified psychosis not due to a substance or known physiological condition: Secondary | ICD-10-CM | POA: Insufficient documentation

## 2013-10-19 DIAGNOSIS — R443 Hallucinations, unspecified: Secondary | ICD-10-CM

## 2013-10-19 DIAGNOSIS — Z7982 Long term (current) use of aspirin: Secondary | ICD-10-CM | POA: Insufficient documentation

## 2013-10-19 DIAGNOSIS — J45909 Unspecified asthma, uncomplicated: Secondary | ICD-10-CM | POA: Insufficient documentation

## 2013-10-19 DIAGNOSIS — I10 Essential (primary) hypertension: Secondary | ICD-10-CM | POA: Insufficient documentation

## 2013-10-19 DIAGNOSIS — E785 Hyperlipidemia, unspecified: Secondary | ICD-10-CM | POA: Insufficient documentation

## 2013-10-19 DIAGNOSIS — F32A Depression, unspecified: Secondary | ICD-10-CM

## 2013-10-19 LAB — COMPREHENSIVE METABOLIC PANEL
ALT: 35 U/L (ref 0–35)
AST: 62 U/L — ABNORMAL HIGH (ref 0–37)
Albumin: 3.8 g/dL (ref 3.5–5.2)
Alkaline Phosphatase: 105 U/L (ref 39–117)
BUN: 41 mg/dL — ABNORMAL HIGH (ref 6–23)
CALCIUM: 9.8 mg/dL (ref 8.4–10.5)
CO2: 25 mEq/L (ref 19–32)
Chloride: 102 mEq/L (ref 96–112)
Creatinine, Ser: 1.36 mg/dL — ABNORMAL HIGH (ref 0.50–1.10)
GFR calc non Af Amer: 38 mL/min — ABNORMAL LOW (ref >90)
GFR, EST AFRICAN AMERICAN: 44 mL/min — AB (ref >90)
GLUCOSE: 180 mg/dL — AB (ref 70–99)
POTASSIUM: 3.8 meq/L (ref 3.7–5.3)
SODIUM: 142 meq/L (ref 137–147)
TOTAL PROTEIN: 8.4 g/dL — AB (ref 6.0–8.3)
Total Bilirubin: 0.2 mg/dL — ABNORMAL LOW (ref 0.3–1.2)

## 2013-10-19 LAB — RAPID URINE DRUG SCREEN, HOSP PERFORMED
AMPHETAMINES: NOT DETECTED
BENZODIAZEPINES: NOT DETECTED
Barbiturates: NOT DETECTED
Cocaine: NOT DETECTED
Opiates: NOT DETECTED
TETRAHYDROCANNABINOL: NOT DETECTED

## 2013-10-19 LAB — URINALYSIS, ROUTINE W REFLEX MICROSCOPIC
BILIRUBIN URINE: NEGATIVE
Glucose, UA: NEGATIVE mg/dL
Hgb urine dipstick: NEGATIVE
KETONES UR: NEGATIVE mg/dL
Nitrite: NEGATIVE
PH: 5 (ref 5.0–8.0)
PROTEIN: NEGATIVE mg/dL
Specific Gravity, Urine: 1.02 (ref 1.005–1.030)
Urobilinogen, UA: 0.2 mg/dL (ref 0.0–1.0)

## 2013-10-19 LAB — CBC WITH DIFFERENTIAL/PLATELET
BASOS PCT: 1 % (ref 0–1)
Basophils Absolute: 0 10*3/uL (ref 0.0–0.1)
EOS PCT: 10 % — AB (ref 0–5)
Eosinophils Absolute: 0.6 10*3/uL (ref 0.0–0.7)
HCT: 32.7 % — ABNORMAL LOW (ref 36.0–46.0)
Hemoglobin: 10.7 g/dL — ABNORMAL LOW (ref 12.0–15.0)
LYMPHS ABS: 1.7 10*3/uL (ref 0.7–4.0)
Lymphocytes Relative: 29 % (ref 12–46)
MCH: 28.8 pg (ref 26.0–34.0)
MCHC: 32.7 g/dL (ref 30.0–36.0)
MCV: 88.1 fL (ref 78.0–100.0)
Monocytes Absolute: 0.5 10*3/uL (ref 0.1–1.0)
Monocytes Relative: 9 % (ref 3–12)
Neutro Abs: 3.1 10*3/uL (ref 1.7–7.7)
Neutrophils Relative %: 52 % (ref 43–77)
PLATELETS: 241 10*3/uL (ref 150–400)
RBC: 3.71 MIL/uL — AB (ref 3.87–5.11)
RDW: 15.1 % (ref 11.5–15.5)
WBC: 6 10*3/uL (ref 4.0–10.5)

## 2013-10-19 LAB — CBG MONITORING, ED
Glucose-Capillary: 165 mg/dL — ABNORMAL HIGH (ref 70–99)
Glucose-Capillary: 244 mg/dL — ABNORMAL HIGH (ref 70–99)

## 2013-10-19 LAB — URINE MICROSCOPIC-ADD ON

## 2013-10-19 LAB — ETHANOL

## 2013-10-19 MED ORDER — ACETAMINOPHEN 325 MG PO TABS
650.0000 mg | ORAL_TABLET | Freq: Once | ORAL | Status: AC
  Administered 2013-10-19: 650 mg via ORAL
  Filled 2013-10-19: qty 2

## 2013-10-19 MED ORDER — FUROSEMIDE 40 MG PO TABS
20.0000 mg | ORAL_TABLET | Freq: Every day | ORAL | Status: DC
  Administered 2013-10-20: 20 mg via ORAL
  Filled 2013-10-19: qty 1

## 2013-10-19 MED ORDER — CYCLOBENZAPRINE HCL 10 MG PO TABS
5.0000 mg | ORAL_TABLET | Freq: Three times a day (TID) | ORAL | Status: DC | PRN
  Administered 2013-10-19: 5 mg via ORAL
  Filled 2013-10-19: qty 1

## 2013-10-19 MED ORDER — SODIUM CHLORIDE 0.9 % IV SOLN
1000.0000 mL | INTRAVENOUS | Status: DC
  Administered 2013-10-19: 1000 mL via INTRAVENOUS

## 2013-10-19 MED ORDER — INSULIN ASPART 100 UNIT/ML ~~LOC~~ SOLN: 0.0000 [IU] | Freq: Three times a day (TID) | SUBCUTANEOUS | Status: DC

## 2013-10-19 MED ORDER — ACETAMINOPHEN 325 MG PO TABS
650.0000 mg | ORAL_TABLET | ORAL | Status: DC | PRN
  Administered 2013-10-20: 650 mg via ORAL
  Filled 2013-10-19 (×2): qty 2

## 2013-10-19 MED ORDER — ASPIRIN 81 MG PO CHEW
81.0000 mg | CHEWABLE_TABLET | Freq: Every day | ORAL | Status: DC
  Administered 2013-10-20: 81 mg via ORAL
  Filled 2013-10-19: qty 1

## 2013-10-19 MED ORDER — NAPROXEN SODIUM 220 MG PO TABS: 220.0000 mg | ORAL_TABLET | Freq: Two times a day (BID) | ORAL | Status: DC | PRN

## 2013-10-19 MED ORDER — NAPROXEN 500 MG PO TABS
250.0000 mg | ORAL_TABLET | Freq: Once | ORAL | Status: AC
  Administered 2013-10-19: 250 mg via ORAL
  Filled 2013-10-19: qty 1

## 2013-10-19 MED ORDER — INSULIN GLARGINE 100 UNIT/ML ~~LOC~~ SOLN
45.0000 [IU] | Freq: Every day | SUBCUTANEOUS | Status: DC
  Administered 2013-10-19: 45 [IU] via SUBCUTANEOUS
  Filled 2013-10-19 (×2): qty 0.45

## 2013-10-19 MED ORDER — NAPROXEN 250 MG PO TABS
250.0000 mg | ORAL_TABLET | Freq: Two times a day (BID) | ORAL | Status: DC | PRN
  Filled 2013-10-19: qty 1

## 2013-10-19 NOTE — BH Assessment (Signed)
Assessment Note  Holly Weiss is an 73 y.o. female. Patient arrived to the ED with her two daughter because of hallucinations of people in her home.  Patient reports there are 2 adult females, 2 younger females, and man that came into her home about a week ago and will not leave.  She reports that these people are in her home doing things she do not accept like having babies.  Patient reports those people are sleeping on top of her clothes and putting snakes on her bed.  She is unable to understand the people when they are talking because they speak really low but she knows they understand each other.  She reports now when they talk to each other they are turning to look at her and she is starting to get scared of them.  "I told them they are the wrong type of people to be here." She reports that she is unable to take a bath in her bathtub because the man got in the bath and left black soot over everything.   CSW spoke with the daughter of the patient to collect collateral information.  The daughter were able to provide documentation of POA and this is now in the chart.  Daughter reports since July the patient's responses to the hallucination increased and she began calling the police reporting people were following her or bothering her.  Patient received skilled rehab with [facility] in December and once discharge the daughter reported her mental status had improved.  The family reports that the patient refused to follow up with appointments and at this time they are now sure she is taking her medications as recommended.  Family reports that the patient has the ability to live with any of her children and will once medically cleared.     Axis I: Psychotic Disorder NOS Axis II: Deferred Axis III:  Past Medical History  Diagnosis Date  . ANEMIA-NOS 01/18/2008  . 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 ASTHMA, WITH ACUTE EXACERBATION 11/29/2008  . Chronic pain syndrome 01/21/2010  . DIABETIC  RETINOPATHY  05/30/2007  . DM, UNCOMPLICATED, TYPE II, UNCONTROLLED 05/30/2007  . 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 HYPERLIPIDEMIA 05/30/2007  . HYPERTENSION 10/31/2007  . LOW BACK PAIN 01/18/2008  . MORBID OBESITY, HX OF 05/30/2007  . NEPHROLITHIASIS, HX OF 01/18/2008  . SHOULDER PAIN, BILATERAL 04/09/2009  . Migraine headache   . Spinal stenosis   . Bilateral carpal tunnel syndrome   . Shortness of breath     with walking  . Urgency of urination   . Arthritis   . Edema of both legs     takes Lasix  . Numbness and tingling in hands    Axis IV: housing problems, other psychosocial or environmental problems, problems related to social environment, problems with access to health care services and problems with primary support group Axis V: 21-30 behavior considerably influenced by delusions or hallucinations OR serious impairment in judgment, communication OR inability to function in almost all areas  Past Medical History:  Past Medical History  Diagnosis Date  . ANEMIA-NOS 01/18/2008  . 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 ASTHMA, WITH ACUTE EXACERBATION 11/29/2008  . Chronic pain syndrome 01/21/2010  . DIABETIC  RETINOPATHY 05/30/2007  . DM, UNCOMPLICATED, TYPE II, UNCONTROLLED 05/30/2007  . 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 HYPERLIPIDEMIA 05/30/2007  . HYPERTENSION 10/31/2007  . LOW BACK PAIN 01/18/2008  . MORBID OBESITY, HX OF 05/30/2007  . NEPHROLITHIASIS, HX OF 01/18/2008  . SHOULDER PAIN, BILATERAL 04/09/2009  .  Migraine headache   . Spinal stenosis   . Bilateral carpal tunnel syndrome   . Shortness of breath     with walking  . Urgency of urination   . Arthritis   . Edema of both legs     takes Lasix  . Numbness and tingling in hands     Past Surgical History  Procedure Laterality Date  . Cataract extraction    . Cholecystectomy    . Thoracic fusion    . Lumbar disc surgury    . Tubal ligation    . Back surgery    . Total knee arthroplasty with revision components Right 07/12/2013     Procedure: TOTAL KNEE ARTHROPLASTY WITH TIBIA REVISION COMPONENTS;  Surgeon: Ninetta Lights, MD;  Location: [facility];  Service: Orthopedics;  Laterality: Right;    Family History:  Family History  Problem Relation Age of Onset  . Diabetes Mother   . Heart disease Mother   . Lung cancer Father   . Heart disease Father   . Diabetes Sister     Social History:  reports that she has never smoked. She does not have any smokeless tobacco history on file. She reports that she does not drink alcohol or use illicit drugs.  Additional Social History:     CIWA: CIWA-Ar BP: 153/83 mmHg Pulse Rate: 85 COWS:    Allergies: No Known Allergies  Home Medications:  (Not in a hospital admission)  OB/GYN Status:  No LMP recorded. Patient is postmenopausal.  General Assessment Data Location of Assessment: [facility] ED ACT Assessment: Yes Is this a Tele or Face-to-Face Assessment?: Face-to-Face Is this an Initial Assessment or a Re-assessment for this encounter?: Initial Assessment Living Arrangements: Alone (Able to live with adult children) Can pt return to current living arrangement?: Yes Admission Status: Voluntary Is patient capable of signing voluntary admission?: Yes Transfer from: Home Referral Source: Self/Family/Friend  Medical Screening Exam ([place]) Medical Exam completed: Yes  [facility] Living Arrangements: Alone (Able to live with adult children)  Education Status Is patient currently in school?: No Highest grade of school patient has completed: 12  Risk to self Suicidal Ideation: No-Not Currently/Within Last 6 Months Suicidal Intent: No-Not Currently/Within Last 6 Months Is patient at risk for suicide?: No Suicidal Plan?: No-Not Currently/Within Last 6 Months Access to Means: No What has been your use of drugs/alcohol within the last 12 months?: none Previous Attempts/Gestures: No Family Suicide History: No Recent stressful life event(s): Loss  (Comment);Other (Comment) (Patient having hallucinations of people in her home ) Persecutory voices/beliefs?: No Depression: No Substance abuse history and/or treatment for substance abuse?: No Suicide prevention information given to non-admitted patients: Not applicable  Risk to Others Homicidal Ideation: No-Not Currently/Within Last 6 Months Thoughts of Harm to Others: No-Not Currently Present/Within Last 6 Months Current Homicidal Intent: No-Not Currently/Within Last 6 Months Current Homicidal Plan: No-Not Currently/Within Last 6 Months Access to Homicidal Means: No History of harm to others?: No Assessment of Violence: None Noted Does patient have access to weapons?: No Criminal Charges Pending?: No Does patient have a court date: No  Psychosis Hallucinations: Visual;Auditory (People in her home taking advantage of her) Delusions: Persecutory (people are taking advantage of her)  Mental Status Report Appear/Hygiene: Other (Comment) (appropriate) Eye Contact: Fair Motor Activity: Restlessness Speech: Soft;Tangential Level of Consciousness: Alert;Restless Mood: Despair;Helpless Affect: Anxious Anxiety Level: Minimal Thought Processes: Circumstantial Judgement: Impaired Orientation: Person;Place Obsessive Compulsive Thoughts/Behaviors: None  Cognitive Functioning Concentration: Decreased Memory:  Recent Intact;Remote Intact IQ: Average Insight: Poor Impulse Control: Fair Appetite: Fair Weight Loss: 0 Weight Gain: 0 Sleep: Decreased Total Hours of Sleep: 5  ADLScreening [facility] Assessment Services) Patient's cognitive ability adequate to safely complete daily activities?: Yes Patient able to express need for assistance with ADLs?: Yes Independently performs ADLs?: Yes (appropriate for developmental age)  Prior Inpatient Therapy Prior Inpatient Therapy: No     ADL Screening (condition at time of admission) Patient's cognitive ability adequate to safely complete  daily activities?: Yes Patient able to express need for assistance with ADLs?: Yes Independently performs ADLs?: Yes (appropriate for developmental age)         Values / Beliefs Cultural Requests During Hospitalization: None Spiritual Requests During Hospitalization: None        Additional Information 1:1 In Past 12 Months?: No CIRT Risk: No Elopement Risk: No Does patient have medical clearance?: Yes     Disposition:  Disposition Initial Assessment Completed for this Encounter: Yes Disposition of Patient: Other dispositions (Pending disposition) Other disposition(s): Other (Comment) (Pending Disposition) It was recommended to refer for gero-psych for evalulation.    On Site Evaluation by:   Reviewed with Physician:    Chesley Noon A 10/19/2013 9:09 PM

## 2013-10-19 NOTE — ED Provider Notes (Addendum)
CSN: [id number]     Arrival date & time 10/19/13  1055 History  First MD Initiated Contact with Patient 10/19/13 1217     Chief Complaint  Patient presents with  . Medical Clearance    HPI The patient presents to the emergency room for evaluation of hallucinations and confusion. Patient states she is seeing mainly and in children in her house but she doesn't even know. His pupils seem to be creating animals and insects in her house. Patient called police yesterday because of these people that she was seen. Patient states that the police were not able to see them because they have the ability to flatten themselves out like paper.  The patient's family states they're concerned about her hallucinations. Deficit to the she's not taking care of herself like she usually does.  Patient denies trouble with headaches or chest pain. She denies any abdominal pain. The patient did have knee surgery in December of last year. Patient was recovering in a nursing facility but subsequently has been home. Past Medical History  Diagnosis Date  . ANEMIA-NOS 01/18/2008  . 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 ASTHMA, WITH ACUTE EXACERBATION 11/29/2008  . Chronic pain syndrome 01/21/2010  . DIABETIC  RETINOPATHY 05/30/2007  . DM, UNCOMPLICATED, TYPE II, UNCONTROLLED 05/30/2007  . 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 HYPERLIPIDEMIA 05/30/2007  . HYPERTENSION 10/31/2007  . LOW BACK PAIN 01/18/2008  . MORBID OBESITY, HX OF 05/30/2007  . NEPHROLITHIASIS, HX OF 01/18/2008  . SHOULDER PAIN, BILATERAL 04/09/2009  . Migraine headache   . Spinal stenosis   . Bilateral carpal tunnel syndrome   . Shortness of breath     with walking  . Urgency of urination   . Arthritis   . Edema of both legs     takes Lasix  . Numbness and tingling in hands    Past Surgical History  Procedure Laterality Date  . Cataract extraction    . Cholecystectomy    . Thoracic fusion    . Lumbar disc surgury    . Tubal ligation    . Back surgery    .  Total knee arthroplasty with revision components Right 07/12/2013    Procedure: TOTAL KNEE ARTHROPLASTY WITH TIBIA REVISION COMPONENTS;  Surgeon: Ninetta Lights, MD;  Location: [facility];  Service: Orthopedics;  Laterality: Right;   Family History  Problem Relation Age of Onset  . Diabetes Mother   . Heart disease Mother   . Lung cancer Father   . Heart disease Father   . Diabetes Sister    History  Substance Use Topics  . Smoking status: Never Smoker   . Smokeless tobacco: Not on file  . Alcohol Use: No   OB History   Grav Para Term Preterm Abortions TAB SAB Ect Mult Living   5 5   0     5     Review of Systems  All other systems reviewed and are negative.      Allergies  Review of patient's allergies indicates no known allergies.  Home Medications   Current Outpatient Rx  Name  Route  Sig  Dispense  Refill  . aspirin 81 MG chewable tablet   Oral   Chew 81 mg by mouth daily.         . cyclobenzaprine (FLEXERIL) 10 MG tablet   Oral   Take 10 mg by mouth 3 (three) times daily as needed for muscle spasms.         . furosemide (LASIX)  20 MG tablet   Oral   Take 20 mg by mouth daily.         . insulin aspart (NOVOLOG FLEXPEN) 100 UNIT/ML FlexPen   Subcutaneous   Inject 15 Units into the skin every morning.         . Insulin Glargine (LANTUS SOLOSTAR) 100 UNIT/ML Solostar Pen   Subcutaneous   Inject 45 Units into the skin at bedtime.         . Multiple Vitamins-Minerals (ONE-A-DAY EXTRAS ANTIOXIDANT PO)   Oral   Take by mouth daily.           . naproxen sodium (ANAPROX) 220 MG tablet   Oral   Take 220 mg by mouth 2 (two) times daily as needed. For pain         . rosuvastatin (CRESTOR) 20 MG tablet   Oral   Take 10 mg by mouth daily.          BP 153/83  Pulse 85  Temp(Src) 98.1 F (36.7 C) (Oral)  Resp 16  SpO2 98% Physical Exam  Nursing note and vitals reviewed. Constitutional: She appears well-developed and well-nourished. No  distress.  HENT:  Head: Normocephalic and atraumatic.  Right Ear: External ear normal.  Left Ear: External ear normal.  Eyes: Conjunctivae are normal. Right eye exhibits no discharge. Left eye exhibits no discharge. No scleral icterus.  Neck: Neck supple. No tracheal deviation present.  Cardiovascular: Normal rate, regular rhythm and intact distal pulses.   Pulmonary/Chest: Effort normal and breath sounds normal. No stridor. No respiratory distress. She has no wheezes. She has no rales.  Abdominal: Soft. Bowel sounds are normal. She exhibits no distension. There is no tenderness. There is no rebound and no guarding.  Musculoskeletal: She exhibits edema (mild bilateral lower extremities). She exhibits no tenderness.  Neurological: She is alert. She has normal strength. No cranial nerve deficit (no facial droop, extraocular movements intact, no slurred speech) or sensory deficit. She exhibits normal muscle tone. She displays no seizure activity. Coordination normal.  Skin: Skin is warm and dry. No rash noted.  Psychiatric: She has a normal mood and affect. Her affect is not angry. She is actively hallucinating. Thought content is delusional. She does not exhibit a depressed mood.    ED Course  Procedures (including critical care time) Labs Review Labs Reviewed  CBC WITH DIFFERENTIAL - Abnormal; Notable for the following:    RBC 3.71 (*)    Hemoglobin 10.7 (*)    HCT 32.7 (*)    Eosinophils Relative 10 (*)    All other components within normal limits  COMPREHENSIVE METABOLIC PANEL - Abnormal; Notable for the following:    Glucose, Bld 180 (*)    BUN 41 (*)    Creatinine, Ser 1.36 (*)    Total Protein 8.4 (*)    AST 62 (*)    Total Bilirubin 0.2 (*)    GFR calc non Af Amer 38 (*)    GFR calc Af Amer 44 (*)    All other components within normal limits  URINE RAPID DRUG SCREEN (HOSP PERFORMED)  ETHANOL  URINALYSIS, ROUTINE W REFLEX MICROSCOPIC   Imaging Review Dg Chest 2  View  10/19/2013   CLINICAL DATA:  Chronic exertional dyspnea, history of asthma and hypertension and morbid obesity.  EXAM: CHEST  2 VIEW  COMPARISON:  DG CHEST 2 VIEW dated 07/07/2013  FINDINGS: The lungs are adequately inflated. There is no focal infiltrate. There is no pleural effusion.  The cardiopericardial silhouette is top-normal in size. The pulmonary vascularity is not engorged. The observed portions of the bony thorax exhibit degenerative disc change at multiple levels.  IMPRESSION: There is no evidence of active cardiopulmonary disease.   Electronically Signed   By: David  Martinique   On: 10/19/2013 13:17   Ct Head Wo Contrast  10/19/2013   CLINICAL DATA:  Medical clearance, hallucinations  EXAM: CT HEAD WITHOUT CONTRAST  TECHNIQUE: Contiguous axial images were obtained from the base of the skull through the vertex without intravenous contrast.  COMPARISON:  None.  FINDINGS: No skull fracture is noted. Paranasal sinuses and mastoid air cells are unremarkable. No intracranial hemorrhage, mass effect or midline shift.  No acute cortical infarction. No mass lesion is noted on this unenhanced scan. Minimal cerebral atrophy.  IMPRESSION: No acute intracranial abnormality.   Electronically Signed   By: Lahoma Crocker M.D.   On: 10/19/2013 13:39     MDM   Final diagnoses:  Hallucinations   Pt does not appear in any distress.  No infection noted or other acute complaints.  UA is pending to evaluate for possible infection as a source.  Will consult with psychiatry for evaluation.    Kathalene Frames, MD 10/20/13 [phone]  UA reviewed this am.  Suggests UTI.  Pt is holding in psych ED.  Will add keflex tid for 5 days  Kathalene Frames, MD 10/20/13 [phone]

## 2013-10-19 NOTE — ED Notes (Signed)
Pt brought in by [employer] IVC; pt states she called police because people coming into her house that aren't welcome; states men and women with babies staying at her house; pt alert and oriented to person; date; birthday; per IVC by family pt has not been taking care of herself and has been having hallucinations

## 2013-10-19 NOTE — Progress Notes (Signed)
CSW spoke with Holly Weiss at [facility] and faxed all documentation for review.      Chesley Noon, MSW, [place], 10/19/2013 Evening Clinical Social Worker [phone]

## 2013-10-19 NOTE — ED Notes (Signed)
Pt. CBG 244. RN,Emily made aware.

## 2013-10-19 NOTE — ED Notes (Signed)
Patient given sandwich and water, per request Patient and family members deny further needs or complaints at this time

## 2013-10-19 NOTE — ED Notes (Signed)
Pt. Is unable to use the restroom at this time, but is aware that we need a urine specimen.  

## 2013-10-19 NOTE — ED Provider Notes (Signed)
10:56 PM  Labs unremarkable.  TTS recommends referral for inpatient Geriatric Psych.  Placed in psych hold.      Houston Siren III, MD 10/19/13 [phone]

## 2013-10-19 NOTE — ED Notes (Signed)
Patient asking for pain medication and a "muscle relaxer" Dr. Doy Mince made aware

## 2013-10-19 NOTE — ED Notes (Signed)
Patient transported to CT 

## 2013-10-19 NOTE — ED Notes (Signed)
MD at bedside. 

## 2013-10-19 NOTE — ED Notes (Signed)
Face to face eval taking place in TCU for privacy purposes

## 2013-10-20 ENCOUNTER — Encounter (HOSPITAL_COMMUNITY): Payer: Self-pay | Admitting: Emergency Medicine

## 2013-10-20 DIAGNOSIS — F29 Unspecified psychosis not due to a substance or known physiological condition: Secondary | ICD-10-CM

## 2013-10-20 LAB — CBG MONITORING, ED
GLUCOSE-CAPILLARY: 102 mg/dL — AB (ref 70–99)
Glucose-Capillary: 55 mg/dL — ABNORMAL LOW (ref 70–99)
Glucose-Capillary: 126 mg/dL — ABNORMAL HIGH (ref 70–99)

## 2013-10-20 MED ORDER — NAPROXEN 500 MG PO TABS
250.0000 mg | ORAL_TABLET | Freq: Two times a day (BID) | ORAL | Status: DC | PRN
  Administered 2013-10-20: 250 mg via ORAL
  Filled 2013-10-20: qty 1

## 2013-10-20 MED ORDER — CEPHALEXIN 500 MG PO CAPS
500.0000 mg | ORAL_CAPSULE | Freq: Three times a day (TID) | ORAL | Status: DC
  Administered 2013-10-20: 500 mg via ORAL
  Filled 2013-10-20: qty 1

## 2013-10-20 NOTE — ED Notes (Signed)
Patient resting in position of comfort with eyes closed RR WNL--even and unlabored with equal rise and fall of chest Patient in NAD Side rails up, call bell in reach  

## 2013-10-20 NOTE — ED Provider Notes (Signed)
Pt without complaints this am.  Sitting up in bed.  Alert, calm. Filed Vitals:   10/20/13 0116  BP: 154/74  Pulse: 85  Temp: 98.4 F (36.9 C)  Resp: 17    Awaiting psych disposition.    Kathalene Frames, MD 10/20/13 [phone]

## 2013-10-20 NOTE — Progress Notes (Signed)
Pt referred to [facility], pending review. (CSW left message to f/u) Pt referred to [facility], pending reivew.  Pt referred to [facility] pending review.    Noreene Larsson [phone]  ED CSW 10/20/2013 841am

## 2013-10-20 NOTE — Consult Note (Signed)
[facility] Face-to-Face Psychiatry Consult   Reason for Consult:  Paranoia Referring Physician:  ED  Physician  Holly Weiss is an 73 y.o. female. Total Time spent with patient: 45 minutes  Assessment: AXIS I:  Psychosis NOS.  r/o Dementia  AXIS II:  Deferred AXIS III:   Past Medical History  Diagnosis Date  . ANEMIA-NOS 01/18/2008  . 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 ASTHMA, WITH ACUTE EXACERBATION 11/29/2008  . Chronic pain syndrome 01/21/2010  . DIABETIC  RETINOPATHY 05/30/2007  . DM, UNCOMPLICATED, TYPE II, UNCONTROLLED 05/30/2007  . 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 HYPERLIPIDEMIA 05/30/2007  . HYPERTENSION 10/31/2007  . LOW BACK PAIN 01/18/2008  . MORBID OBESITY, HX OF 05/30/2007  . NEPHROLITHIASIS, HX OF 01/18/2008  . SHOULDER PAIN, BILATERAL 04/09/2009  . Migraine headache   . Spinal stenosis   . Bilateral carpal tunnel syndrome   . Shortness of breath     with walking  . Urgency of urination   . Arthritis   . Edema of both legs     takes Lasix  . Numbness and tingling in hands    AXIS IV:  housing problems and other psychosocial or environmental problems AXIS V:  31-40 impairment in reality testing  Plan:  Recommend psychiatric Inpatient admission when medically cleared.  Subjective:   Holly Weiss is a 73 y.o. female patient admitted with paranoia and hallucinations. Feels that babies are in the house. There are nasty things going tout there.  HPI: Holly Weiss is an 73 y.o. female. Patient arrived to the ED with her two daughter because of hallucinations of people in her home. Patient reports there are 2 adult females, 2 younger females, and man that came into her home about a week ago and will not leave. She reports that these people are in her home doing things she do not accept like having babies. Patient reports those people are sleeping on top of her clothes and putting snakes on her bed. She is unable to understand the people when they are talking because they speak really  low but she knows they understand each other. She reports now when they talk to each other they are turning to look at her and she is starting to get scared of them. "I told them they are the wrong type of people to be here." She reports that she is unable to take a bath in her bathtub because the man got in the bath and left black soot over everything.  On evaluation today lying in bed but still feels nasty things are happening and there are babies in the house. I am not crazy but there is something not right in the house.  She lives by herself and her daughter was there who agrees she is not doing well and getting paranoid and somewhat bizarre thoughts.  HPI Elements:   Location:  psychosis. Quality:  moderate. Severity:  recurrent.  Past Psychiatric History: Past Medical History  Diagnosis Date  . ANEMIA-NOS 01/18/2008  . 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 ASTHMA, WITH ACUTE EXACERBATION 11/29/2008  . Chronic pain syndrome 01/21/2010  . DIABETIC  RETINOPATHY 05/30/2007  . DM, UNCOMPLICATED, TYPE II, UNCONTROLLED 05/30/2007  . 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 HYPERLIPIDEMIA 05/30/2007  . HYPERTENSION 10/31/2007  . LOW BACK PAIN 01/18/2008  . MORBID OBESITY, HX OF 05/30/2007  . NEPHROLITHIASIS, HX OF 01/18/2008  . SHOULDER PAIN, BILATERAL 04/09/2009  . Migraine headache   . Spinal stenosis   . Bilateral carpal tunnel syndrome   .  Shortness of breath     with walking  . Urgency of urination   . Arthritis   . Edema of both legs     takes Lasix  . Numbness and tingling in hands     reports that she has never smoked. She does not have any smokeless tobacco history on file. She reports that she does not drink alcohol or use illicit drugs. Family History  Problem Relation Age of Onset  . Diabetes Mother   . Heart disease Mother   . Lung cancer Father   . Heart disease Father   . Diabetes Sister    Family History Substance Abuse: No Family Supports: Yes, List: [name] and Davy Pique are the  [employer]) Living Arrangements: Alone (Able to live with adult children) Can pt return to current living arrangement?: Yes   Allergies:  No Known Allergies  ACT Assessment Complete:  Yes:    Educational Status    Risk to Self: Risk to self Suicidal Ideation: No-Not Currently/Within Last 6 Months Suicidal Intent: No-Not Currently/Within Last 6 Months Is patient at risk for suicide?: No Suicidal Plan?: No-Not Currently/Within Last 6 Months Access to Means: No What has been your use of drugs/alcohol within the last 12 months?: none Previous Attempts/Gestures: No Family Suicide History: No Recent stressful life event(s): Loss (Comment);Other (Comment) (Patient having hallucinations of people in her home ) Persecutory voices/beliefs?: No Depression: No Substance abuse history and/or treatment for substance abuse?: No Suicide prevention information given to non-admitted patients: Not applicable  Risk to Others: Risk to Others Homicidal Ideation: No-Not Currently/Within Last 6 Months Thoughts of Harm to Others: No-Not Currently Present/Within Last 6 Months Current Homicidal Intent: No-Not Currently/Within Last 6 Months Current Homicidal Plan: No-Not Currently/Within Last 6 Months Access to Homicidal Means: No History of harm to others?: No Assessment of Violence: None Noted Does patient have access to weapons?: No Criminal Charges Pending?: No Does patient have a court date: No  Abuse:    Prior Inpatient Therapy: Prior Inpatient Therapy Prior Inpatient Therapy: No  Prior Outpatient Therapy:    Additional Information: Additional Information 1:1 In Past 12 Months?: No CIRT Risk: No Elopement Risk: No Does patient have medical clearance?: Yes                  Objective: Blood pressure 155/60, pulse 98, temperature 97.3 F (36.3 C), temperature source Oral, resp. rate 17, SpO2 98.00%.There is no weight on file to calculate BMI. Results for orders placed during the hospital  encounter of 10/19/13 (from the past 72 hour(s))  CBC WITH DIFFERENTIAL     Status: Abnormal   Collection Time    10/19/13 11:50 AM      Result Value Ref Range   WBC 6.0  4.0 - 10.5 K/uL   RBC 3.71 (*) 3.87 - 5.11 MIL/uL   Hemoglobin 10.7 (*) 12.0 - 15.0 g/dL   HCT 32.7 (*) 36.0 - 46.0 %   MCV 88.1  78.0 - 100.0 fL   MCH 28.8  26.0 - 34.0 pg   MCHC 32.7  30.0 - 36.0 g/dL   RDW 15.1  11.5 - 15.5 %   Platelets 241  150 - 400 K/uL   Neutrophils Relative % 52  43 - 77 %   Neutro Abs 3.1  1.7 - 7.7 K/uL   Lymphocytes Relative 29  12 - 46 %   Lymphs Abs 1.7  0.7 - 4.0 K/uL   Monocytes Relative 9  3 - 12 %  Monocytes Absolute 0.5  0.1 - 1.0 K/uL   Eosinophils Relative 10 (*) 0 - 5 %   Eosinophils Absolute 0.6  0.0 - 0.7 K/uL   Basophils Relative 1  0 - 1 %   Basophils Absolute 0.0  0.0 - 0.1 K/uL  COMPREHENSIVE METABOLIC PANEL     Status: Abnormal   Collection Time    10/19/13 11:50 AM      Result Value Ref Range   Sodium 142  137 - 147 mEq/L   Potassium 3.8  3.7 - 5.3 mEq/L   Chloride 102  96 - 112 mEq/L   CO2 25  19 - 32 mEq/L   Glucose, Bld 180 (*) 70 - 99 mg/dL   BUN 41 (*) 6 - 23 mg/dL   Creatinine, Ser 1.36 (*) 0.50 - 1.10 mg/dL   Calcium 9.8  8.4 - 10.5 mg/dL   Total Protein 8.4 (*) 6.0 - 8.3 g/dL   Albumin 3.8  3.5 - 5.2 g/dL   AST 62 (*) 0 - 37 U/L   ALT 35  0 - 35 U/L   Alkaline Phosphatase 105  39 - 117 U/L   Total Bilirubin 0.2 (*) 0.3 - 1.2 mg/dL   GFR calc non Af Amer 38 (*) >90 mL/min   GFR calc Af Amer 44 (*) >90 mL/min   Comment: (NOTE)     The eGFR has been calculated using the CKD EPI equation.     This calculation has not been validated in all clinical situations.     eGFR's persistently <90 mL/min signify possible Chronic Kidney     Disease.  ETHANOL     Status: None   Collection Time    10/19/13 11:50 AM      Result Value Ref Range   Alcohol, Ethyl (B) <11  0 - 11 mg/dL   Comment:            LOWEST DETECTABLE LIMIT FOR     SERUM ALCOHOL IS 11  mg/dL     FOR MEDICAL PURPOSES ONLY  URINE RAPID DRUG SCREEN (HOSP PERFORMED)     Status: None   Collection Time    10/19/13  3:12 PM      Result Value Ref Range   Opiates NONE DETECTED  NONE DETECTED   Cocaine NONE DETECTED  NONE DETECTED   Benzodiazepines NONE DETECTED  NONE DETECTED   Amphetamines NONE DETECTED  NONE DETECTED   Tetrahydrocannabinol NONE DETECTED  NONE DETECTED   Barbiturates NONE DETECTED  NONE DETECTED   Comment:            DRUG SCREEN FOR MEDICAL PURPOSES     ONLY.  IF CONFIRMATION IS NEEDED     FOR ANY PURPOSE, NOTIFY LAB     WITHIN 5 DAYS.                LOWEST DETECTABLE LIMITS     FOR URINE DRUG SCREEN     Drug Class       Cutoff (ng/mL)     Amphetamine      1000     Barbiturate      200     Benzodiazepine   233     Tricyclics       007     Opiates          300     Cocaine          300     THC  84  URINALYSIS, ROUTINE W REFLEX MICROSCOPIC     Status: Abnormal   Collection Time    10/19/13  3:12 PM      Result Value Ref Range   Color, Urine YELLOW  YELLOW   APPearance CLOUDY (*) CLEAR   Specific Gravity, Urine 1.020  1.005 - 1.030   pH 5.0  5.0 - 8.0   Glucose, UA NEGATIVE  NEGATIVE mg/dL   Hgb urine dipstick NEGATIVE  NEGATIVE   Bilirubin Urine NEGATIVE  NEGATIVE   Ketones, ur NEGATIVE  NEGATIVE mg/dL   Protein, ur NEGATIVE  NEGATIVE mg/dL   Urobilinogen, UA 0.2  0.0 - 1.0 mg/dL   Nitrite NEGATIVE  NEGATIVE   Leukocytes, UA MODERATE (*) NEGATIVE  URINE MICROSCOPIC-ADD ON     Status: Abnormal   Collection Time    10/19/13  3:12 PM      Result Value Ref Range   Squamous Epithelial / LPF FEW (*) RARE   WBC, UA 11-20  <3 WBC/hpf   RBC / HPF 3-6  <3 RBC/hpf   Bacteria, UA FEW (*) RARE   Urine-Other MUCOUS PRESENT    CBG MONITORING, ED     Status: Abnormal   Collection Time    10/19/13  5:17 PM      Result Value Ref Range   Glucose-Capillary 165 (*) 70 - 99 mg/dL  CBG MONITORING, ED     Status: Abnormal   Collection Time     10/19/13 11:10 PM      Result Value Ref Range   Glucose-Capillary 244 (*) 70 - 99 mg/dL  CBG MONITORING, ED     Status: Abnormal   Collection Time    10/20/13  8:13 AM      Result Value Ref Range   Glucose-Capillary 55 (*) 70 - 99 mg/dL  CBG MONITORING, ED     Status: Abnormal   Collection Time    10/20/13  9:05 AM      Result Value Ref Range   Glucose-Capillary 102 (*) 70 - 99 mg/dL   Labs are reviewed and are pertinent for see medical notes.  Current Facility-Administered Medications  Medication Dose Route Frequency Provider Last Rate Last Dose  . 0.9 %  sodium chloride infusion  1,000 mL Intravenous Continuous Kathalene Frames, MD   1,000 mL at 10/19/13 1303  . acetaminophen (TYLENOL) tablet 650 mg  650 mg Oral Q4H PRN Houston Siren III, MD   650 mg at 10/20/13 1031  . aspirin chewable tablet 81 mg  81 mg Oral Daily Houston Siren III, MD   81 mg at 10/20/13 9924  . cephALEXin (KEFLEX) capsule 500 mg  500 mg Oral 3 times per day Kathalene Frames, MD   500 mg at 10/20/13 0820  . cyclobenzaprine (FLEXERIL) tablet 5 mg  5 mg Oral TID PRN Houston Siren III, MD   5 mg at 10/19/13 2229  . furosemide (LASIX) tablet 20 mg  20 mg Oral Daily Houston Siren III, MD   20 mg at 10/20/13 2683  . insulin aspart (novoLOG) injection 0-15 Units  0-15 Units Subcutaneous TID WC Houston Siren III, MD      . insulin glargine (LANTUS) injection 45 Units  45 Units Subcutaneous QHS Houston Siren III, MD   45 Units at 10/19/13 2314  . naproxen (NAPROSYN) tablet 250 mg  250 mg Oral BID PRN Johnna Acosta, MD   250 mg at 10/20/13 [phone]  Current Outpatient Prescriptions  Medication Sig Dispense Refill  . aspirin 81 MG chewable tablet Chew 81 mg by mouth daily.      . cyclobenzaprine (FLEXERIL) 10 MG tablet Take 10 mg by mouth 3 (three) times daily as needed for muscle spasms.      . furosemide (LASIX) 20 MG tablet Take 20 mg by mouth daily.      . insulin aspart (NOVOLOG FLEXPEN) 100 UNIT/ML  FlexPen Inject 15 Units into the skin every morning.      . Insulin Glargine (LANTUS SOLOSTAR) 100 UNIT/ML Solostar Pen Inject 45 Units into the skin at bedtime.      . Multiple Vitamins-Minerals (ONE-A-DAY EXTRAS ANTIOXIDANT PO) Take by mouth daily.        . naproxen sodium (ANAPROX) 220 MG tablet Take 220 mg by mouth 2 (two) times daily as needed. For pain      . rosuvastatin (CRESTOR) 20 MG tablet Take 10 mg by mouth daily.        Psychiatric Specialty Exam:     Blood pressure 155/60, pulse 98, temperature 97.3 F (36.3 C), temperature source Oral, resp. rate 17, SpO2 98.00%.There is no weight on file to calculate BMI.  General Appearance: Casual  Eye Contact::  Fair  Speech:  Slow  Volume:  Decreased  Mood:  Euthymic  Affect:  Constricted  Thought Process:  Disorganized  Orientation:  Full (Time, Place, and Person)  Thought Content:  Ideas of Reference:   Paranoia and Paranoid Ideation  Suicidal Thoughts:  No  Homicidal Thoughts:  No  Memory:  Recent;   Poor  Judgement:  Poor  Insight:  Lacking  Psychomotor Activity:  Decreased  Concentration:  Poor  Recall:  Poor  Fund of Knowledge:Fair  Language: Fair  Akathisia:  Negative  Handed:  Right  AIMS (if indicated):     Assets:  Desire for Improvement Financial Resources/Insurance  Sleep:      Musculoskeletal: Strength & Muscle Tone: within normal limits Gait & Station: normal Patient leans: N/A  Treatment Plan Summary: Daily contact with patient to assess and evaluate symptoms and progress in treatment Medication management Admit for stabilization.  Patient has been accepted at [facility].  De Nurse, Orpah Greek MD 10/20/2013 11:20 AM

## 2013-10-20 NOTE — ED Notes (Signed)
Pt given graham crackers/peanut butter and orange juice, will recheck CBG after eats.

## 2013-10-20 NOTE — ED Notes (Signed)
Bed: WA22 Expected date:  Expected time:  Means of arrival:  Comments: 

## 2013-10-20 NOTE — Progress Notes (Signed)
Pt accepted to [facility], by Dr. Ananias Pilgrim to room [phone]. Patient to be transported involuntarily by sheriff[employer] department. Patient RN can call report to [phone]. Per Darlene at [facility], pt bed is ready.   Noreene Larsson [phone]  ED CSW 10/20/2013 1054am

## 2013-10-21 DIAGNOSIS — IMO0002 Reserved for concepts with insufficient information to code with codable children: Secondary | ICD-10-CM | POA: Insufficient documentation

## 2013-10-21 LAB — URINE CULTURE

## 2013-11-01 ENCOUNTER — Ambulatory Visit (INDEPENDENT_AMBULATORY_CARE_PROVIDER_SITE_OTHER): Payer: Medicare Other | Admitting: Internal Medicine

## 2013-11-01 ENCOUNTER — Other Ambulatory Visit: Payer: Self-pay | Admitting: Internal Medicine

## 2013-11-01 ENCOUNTER — Other Ambulatory Visit: Payer: Self-pay | Admitting: *Deleted

## 2013-11-01 ENCOUNTER — Telehealth: Payer: Self-pay | Admitting: *Deleted

## 2013-11-01 ENCOUNTER — Encounter: Payer: Self-pay | Admitting: Internal Medicine

## 2013-11-01 VITALS — BP 122/62 | HR 72 | Temp 98.2°F | Wt 190.0 lb

## 2013-11-01 DIAGNOSIS — R441 Visual hallucinations: Secondary | ICD-10-CM

## 2013-11-01 DIAGNOSIS — E785 Hyperlipidemia, unspecified: Secondary | ICD-10-CM

## 2013-11-01 DIAGNOSIS — R1319 Other dysphagia: Secondary | ICD-10-CM

## 2013-11-01 DIAGNOSIS — H5316 Psychophysical visual disturbances: Secondary | ICD-10-CM

## 2013-11-01 DIAGNOSIS — I10 Essential (primary) hypertension: Secondary | ICD-10-CM

## 2013-11-01 DIAGNOSIS — IMO0001 Reserved for inherently not codable concepts without codable children: Secondary | ICD-10-CM

## 2013-11-01 DIAGNOSIS — R29898 Other symptoms and signs involving the musculoskeletal system: Secondary | ICD-10-CM

## 2013-11-01 DIAGNOSIS — E1165 Type 2 diabetes mellitus with hyperglycemia: Secondary | ICD-10-CM

## 2013-11-01 MED ORDER — GLUCOSE BLOOD VI STRP: ORAL_STRIP | Status: DC

## 2013-11-01 MED ORDER — ONETOUCH LANCETS MISC: Status: DC

## 2013-11-01 MED ORDER — INSULIN DETEMIR 100 UNIT/ML FLEXPEN: PEN_INJECTOR | SUBCUTANEOUS | Status: DC

## 2013-11-01 MED ORDER — PANTOPRAZOLE SODIUM 40 MG PO TBEC: 40.0000 mg | DELAYED_RELEASE_TABLET | Freq: Every day | ORAL | Status: DC

## 2013-11-01 MED ORDER — ONETOUCH BASIC SYSTEM W/DEVICE KIT: PACK | Status: DC

## 2013-11-01 MED ORDER — INSULIN LISPRO 100 UNIT/ML (KWIKPEN): PEN_INJECTOR | SUBCUTANEOUS | Status: DC

## 2013-11-01 NOTE — Telephone Encounter (Signed)
Melanie from [facility] called requesting OT for pts hand weakening.  Please advise

## 2013-11-01 NOTE — Progress Notes (Signed)
Pre visit review using our clinic review tool, if applicable. No additional management support is needed unless otherwise documented below in the visit note. 

## 2013-11-01 NOTE — Telephone Encounter (Signed)
Done per emr 

## 2013-11-01 NOTE — Patient Instructions (Signed)
Please take all new medication as prescribed - the insulins as prescribed, and the protonix 40 mg per day  Please continue all other medications as before, including the risperdone  Please have the pharmacy call with any other refills you may need.  You will be contacted regarding the referral for: Gastroenterology  Please keep your appointments with your specialists as you have planned  - Psychiatry

## 2013-11-01 NOTE — Progress Notes (Signed)
Subjective:    Patient ID: Holly Weiss, female    DOB: 04-Sep-1940, 73 y.o.   MRN: [id number]  HPI  Here to f/u with family, recently hospd with paranoia at [facility], now improved on risperidone, but stopped her med 3 days ago due to some difficulty swallowing with solids getting stuck at the lower mid sternal area.  No vomiting, choking, fever, nausea, abd pain, and seems to resolve with taking more fluids.  Has occas reflux, Denies worsening  bowel change or blood.  Pt denies chest pain, increased sob or doe, wheezing, orthopnea, PND, increased LE swelling, palpitations, dizziness or syncope. Pt denies new neurological symptoms such as new headache, or facial or extremity weakness or numbness   Pt denies polydipsia, polyuria, Past Medical History  Diagnosis Date  . ANEMIA-NOS 01/18/2008  . 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 ASTHMA, WITH ACUTE EXACERBATION 11/29/2008  . Chronic pain syndrome 01/21/2010  . DIABETIC  RETINOPATHY 05/30/2007  . DM, UNCOMPLICATED, TYPE II, UNCONTROLLED 05/30/2007  . 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 HYPERLIPIDEMIA 05/30/2007  . HYPERTENSION 10/31/2007  . LOW BACK PAIN 01/18/2008  . MORBID OBESITY, HX OF 05/30/2007  . NEPHROLITHIASIS, HX OF 01/18/2008  . SHOULDER PAIN, BILATERAL 04/09/2009  . Migraine headache   . Spinal stenosis   . Bilateral carpal tunnel syndrome   . Shortness of breath     with walking  . Urgency of urination   . Arthritis   . Edema of both legs     takes Lasix  . Numbness and tingling in hands    Past Surgical History  Procedure Laterality Date  . Cataract extraction    . Cholecystectomy    . Thoracic fusion    . Lumbar disc surgury    . Tubal ligation    . Back surgery    . 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  She does not have any smokeless tobacco history on file. She reports that she does not drink alcohol or use illicit drugs. family history includes Diabetes in her mother and sister; Heart disease in her father and mother; Lung cancer in her father. No Known Allergies Current Outpatient Prescriptions on File Prior to Visit  Medication Sig Dispense Refill  . aspirin 81 MG chewable tablet Chew 81 mg by mouth daily.      . cyclobenzaprine (FLEXERIL) 10 MG tablet Take 10 mg by mouth 3 (three) times daily as needed for muscle spasms.      . furosemide (LASIX) 20 MG tablet Take 20 mg by mouth daily.      . Multiple Vitamins-Minerals (ONE-A-DAY EXTRAS ANTIOXIDANT PO) Take by mouth daily.        . naproxen sodium (ANAPROX) 220 MG tablet Take 220 mg by mouth 2 (two) times daily as needed. For pain      . rosuvastatin (CRESTOR) 20 MG tablet Take 10 mg by mouth daily.       No current facility-administered medications on file prior to visit.   .   Review of Systems  Constitutional: Negative for unexpected weight change, or unusual diaphoresis  HENT: Negative for tinnitus.   Eyes: Negative for photophobia and visual disturbance.  Respiratory: Negative for choking and stridor.   Gastrointestinal: Negative for vomiting and blood in stool.  Genitourinary:  Negative for hematuria and decreased urine volume.  Musculoskeletal: Negative for acute joint swelling Skin: Negative for color change and wound.  Neurological: Negative for tremors and numbness other than noted  Psychiatric/Behavioral: Negative for decreased concentration or  hyperactivity.       Objective:   Physical Exam BP 122/62  Pulse 72  Temp(Src) 98.2 F (36.8 C) (Oral)  Wt 190 lb (86.183 kg)  SpO2 98% VS noted,  Constitutional: Pt appears well-developed and well-nourished.  HENT: Head: NCAT.  Right Ear: External ear normal.  Left Ear: External ear normal.  Eyes: Conjunctivae and EOM are normal. Pupils are equal, round, and reactive  to light.  Neck: Normal range of motion. Neck supple.  Cardiovascular: Normal rate and regular rhythm.   Pulmonary/Chest: Effort normal and breath sounds normal.  Abd:  Soft, NT, non-distended, + BS Neurological: Pt is alert. Not confused  Skin: Skin is warm. No erythema.  Psychiatric: Pt behavior is normal. Not agitated    Assessment & Plan:

## 2013-11-01 NOTE — Telephone Encounter (Signed)
Received call from [facility] requesting diabetic testing supplies to be sent in to [facility].  Refill has been done as requested.

## 2013-11-04 DIAGNOSIS — R1319 Other dysphagia: Secondary | ICD-10-CM | POA: Insufficient documentation

## 2013-11-04 NOTE — Assessment & Plan Note (Signed)
stable overall by history and exam, recent data reviewed with pt, and pt to continue medical treatment as before,  to f/u any worsening symptoms or concerns Lab Results  Component Value Date   HGBA1C 6.9* 07/12/2013

## 2013-11-04 NOTE — Assessment & Plan Note (Signed)
For soft diet, refer GI

## 2013-11-04 NOTE — Assessment & Plan Note (Signed)
With paranoia - to re-start risperdal, pt and family reassured regarding dysphagia likely not related

## 2013-11-04 NOTE — Assessment & Plan Note (Signed)
stable overall by history and exam, recent data reviewed with pt, and pt to continue medical treatment as before,  to f/u any worsening symptoms or concerns Lab Results  Component Value Date   LDLCALC 72 07/21/2011    

## 2013-11-04 NOTE — Assessment & Plan Note (Signed)
stable overall by history and exam, recent data reviewed with pt, and pt to continue medical treatment as before,  to f/u any worsening symptoms or concerns Lab Results  Component Value Date   HGBA1C 6.9* 07/12/2013   BP Readings from Last 3 Encounters:  11/01/13 122/62  10/20/13 155/60  08/10/13 124/60

## 2013-11-07 ENCOUNTER — Encounter (HOSPITAL_COMMUNITY): Payer: Self-pay | Admitting: Psychiatry

## 2013-11-07 ENCOUNTER — Telehealth: Payer: Self-pay

## 2013-11-07 ENCOUNTER — Ambulatory Visit (INDEPENDENT_AMBULATORY_CARE_PROVIDER_SITE_OTHER): Payer: No Typology Code available for payment source | Admitting: Psychiatry

## 2013-11-07 VITALS — BP 130/82 | HR 69 | Ht 58.5 in | Wt 192.6 lb

## 2013-11-07 DIAGNOSIS — F0390 Unspecified dementia without behavioral disturbance: Secondary | ICD-10-CM

## 2013-11-07 DIAGNOSIS — H539 Unspecified visual disturbance: Secondary | ICD-10-CM

## 2013-11-07 DIAGNOSIS — F0392 Unspecified dementia, unspecified severity, with psychotic disturbance: Secondary | ICD-10-CM

## 2013-11-07 DIAGNOSIS — F29 Unspecified psychosis not due to a substance or known physiological condition: Secondary | ICD-10-CM

## 2013-11-07 DIAGNOSIS — F0391 Unspecified dementia with behavioral disturbance: Secondary | ICD-10-CM

## 2013-11-07 MED ORDER — RISPERIDONE 0.5 MG PO TABS: 0.5000 mg | ORAL_TABLET | Freq: Every day | ORAL | Status: DC
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
## 2013-11-07 NOTE — Telephone Encounter (Signed)
HHRN informed 

## 2013-11-07 NOTE — Patient Instructions (Signed)
Dementia Dementia is a general term for problems with brain function. A person with dementia has memory loss and a hard time with at least one other brain function such as thinking, speaking, or problem solving. Dementia can affect social functioning, how you do your job, your mood, or your personality. The changes may be hidden for a long time. The earliest forms of this disease are usually not detected by family or friends. Dementia can be:  Irreversible.  Potentially reversible.  Partially reversible.  Progressive. This means it can get worse over time. CAUSES  Irreversible dementia causes may include:  Degeneration of brain cells (Alzheimer's disease or lewy body dementia).  Multiple small strokes (vascular dementia).  Infection (chronic meningitis or Creutzfelt-Jakob disease).  Frontotemporal dementia. This affects younger people, age 40 to 70, compared to those who have Alzheimer's disease.  Dementia associated with other disorders like Parkinson's disease, Huntington's disease, or HIV-associated dementia. Potentially or partially reversible dementia causes may include:  Medicines.  Metabolic causes such as excessive alcohol intake, vitamin B12 deficiency, or thyroid disease.  Masses or pressure in the brain such as a tumor, blood clot, or hydrocephalus. SYMPTOMS  Symptoms are often hard to detect. Family members or coworkers may not notice them early in the disease process. Different people with dementia may have different symptoms. Symptoms can include:  A hard time with memory, especially recent memory. Long-term memory may not be impaired.  Asking the same question multiple times or forgetting something someone just said.  A hard time speaking your thoughts or finding certain words.  A hard time solving problems or performing familiar tasks (such as how to use a telephone).  Sudden changes in mood.  Changes in personality, especially increasing moodiness or  mistrust.  Depression.  A hard time understanding complex ideas that were never a problem in the past. DIAGNOSIS  There are no specific tests for dementia.   Your caregiver may recommend a thorough evaluation. This is because some forms of dementia can be reversible. The evaluation will likely include a physical exam and getting a detailed history from you and a family member. The history often gives the best clues and suggestions for a diagnosis.  Memory testing may be done. A detailed brain function evaluation called neuropsychologic testing may be helpful.  Lab tests and brain imaging (such as a CT scan or MRI scan) are sometimes important.  Sometimes observation and re-evaluation over time is very helpful. TREATMENT  Treatment depends on the cause.   If the problem is a vitamin deficiency, it may be helped or cured with supplements.  For dementias such as Alzheimer's disease, medicines are available to stabilize or slow the course of the disease. There are no cures for this type of dementia.  Your caregiver can help direct you to groups, organizations, and other caregivers to help with decisions in the care of you or your loved one. HOME CARE INSTRUCTIONS The care of individuals with dementia is varied and dependent upon the progression of the dementia. The following suggestions are intended for the person living with, or caring for, the person with dementia.  Create a safe environment.  Remove the locks on bathroom doors to prevent the person from accidentally locking himself or herself in.  Use childproof latches on kitchen cabinets and any place where cleaning supplies, chemicals, or alcohol are kept.  Use childproof covers in unused electrical outlets.  Install childproof devices to keep doors and windows secured.  Remove stove knobs or install safety   knobs and an automatic shut-off on the stove.  Lower the temperature on water heaters.  Label medicines and keep them  locked up.  Secure knives, lighters, matches, power tools, and guns, and keep these items out of reach.  Keep the house free from clutter. Remove rugs or anything that might contribute to a fall.  Remove objects that might break and hurt the person.  Make sure lighting is good, both inside and outside.  Install grab rails as needed.  Use a monitoring device to alert you to falls or other needs for help.  Reduce confusion.  Keep familiar objects and people around.  Use night lights or dim lights at night.  Label items or areas.  Use reminders, notes, or directions for daily activities or tasks.  Keep a simple, consistent routine for waking, meals, bathing, dressing, and bedtime.  Create a calm, quiet environment.  Place large clocks and calendars prominently.  Display emergency numbers and home address near all telephones.  Use cues to establish different times of the day. An example is to open curtains to let the natural light in during the day.   Use effective communication.  Choose simple words and short sentences.  Use a gentle, calm tone of voice.  Be careful not to interrupt.  If the person is struggling to find a word or communicate a thought, try to provide the word or thought.  Ask one question at a time. Allow the person ample time to answer questions. Repeat the question again if the person does not respond.  Reduce nighttime restlessness.  Provide a comfortable bed.  Have a consistent nighttime routine.  Ensure a regular walking or physical activity schedule. Involve the person in daily activities as much as possible.  Limit napping during the day.  Limit caffeine.  Attend social events that stimulate rather than overwhelm the senses.  Encourage good nutrition and hydration.  Reduce distractions during meal times and snacks.  Avoid foods that are too hot or too cold.  Monitor chewing and swallowing ability.  Continue with routine vision,  hearing, dental, and medical screenings.  Only give over-the-counter or prescription medicines as directed by the caregiver.  Monitor driving abilities. Do not allow the person to drive when safe driving is no longer possible.  Register with an identification program which could provide location assistance in the event of a missing person situation. SEEK MEDICAL CARE IF:   New behavioral problems start such as moodiness, aggressiveness, or seeing things that are not there (hallucinations).  Any new problem with brain function happens. This includes problems with balance, speech, or falling a lot.  Problems with swallowing develop.  Any symptoms of other illness happen. Small changes or worsening in any aspect of brain function can be a sign that the illness is getting worse. It can also be a sign of another medical illness such as infection. Seeing a caregiver right away is important. SEEK IMMEDIATE MEDICAL CARE IF:   A fever develops.  New or worsened confusion develops.  New or worsened sleepiness develops.  Staying awake becomes hard to do. Document Released: 01/13/2001 Document Revised: 10/12/2011 Document Reviewed: 12/15/2010 Exit[employer] Patient Information 2014 [employer].  

## 2013-11-07 NOTE — Telephone Encounter (Signed)
The physical therapist is hoping to get a verbal order for occupational therapy (due to hand pain) Therapist - [facility] - callback - [phone]

## 2013-11-07 NOTE — Telephone Encounter (Signed)
Ok for verbal 

## 2013-11-08 ENCOUNTER — Other Ambulatory Visit: Payer: Self-pay

## 2013-11-08 MED ORDER — ONETOUCH DELICA LANCETS 33G MISC
Status: DC
Start: 2013-11-08 — End: 2015-01-30

## 2013-11-09 DIAGNOSIS — H5316 Psychophysical visual disturbances: Secondary | ICD-10-CM

## 2013-11-09 DIAGNOSIS — F319 Bipolar disorder, unspecified: Secondary | ICD-10-CM

## 2013-11-09 DIAGNOSIS — IMO0001 Reserved for inherently not codable concepts without codable children: Secondary | ICD-10-CM

## 2013-11-09 DIAGNOSIS — I509 Heart failure, unspecified: Secondary | ICD-10-CM

## 2013-11-09 DIAGNOSIS — E1165 Type 2 diabetes mellitus with hyperglycemia: Secondary | ICD-10-CM

## 2013-11-10 ENCOUNTER — Telehealth: Payer: Self-pay | Admitting: Internal Medicine

## 2013-11-10 NOTE — Telephone Encounter (Signed)
[facility] to say they missed the physical therapy visit this week because she was not feeling well.  They will continue next week.

## 2013-11-10 NOTE — Telephone Encounter (Signed)
noted 

## 2013-11-13 ENCOUNTER — Telehealth: Payer: Self-pay | Admitting: Internal Medicine

## 2013-11-13 NOTE — Telephone Encounter (Signed)
Melanie, patient's physical therapist, with [facility] is calling in regards to the patient who she just saw and says that the patient is having severe knee pain and the Percocet she is taking does not seem to be helping. She just wanted for Dr. Jenny Reichmann to be aware and can be reached back at [phone] with questions.

## 2013-11-13 NOTE — Telephone Encounter (Signed)
Holly Weiss [phone])  Informed of MD instructions.  She would inform the patient and her family.

## 2013-11-13 NOTE — Telephone Encounter (Signed)
Pt is s/p right knee surgury per Dr Percell Miller dec 2014, and pain control seemed adequate for d/c to rehab at that time  Please have pt make appt with Dr Percell Miller if this has worsened, for re-assessment.  Simply giving more pain med at this time would not seem to be appropriate.   She (and family ) could consider seeing Dr Tamala Julian in our office if unable to see Dr Percell Miller in a timely fashion.

## 2013-11-14 ENCOUNTER — Emergency Department (HOSPITAL_COMMUNITY)
Admission: EM | Admit: 2013-11-14 | Discharge: 2013-11-14 | Disposition: A | Discharge status: Home / Self Care | Payer: Medicare Other | Attending: Emergency Medicine | Admitting: Emergency Medicine

## 2013-11-14 ENCOUNTER — Encounter (HOSPITAL_COMMUNITY): Payer: Self-pay | Admitting: Emergency Medicine

## 2013-11-14 DIAGNOSIS — Z7982 Long term (current) use of aspirin: Secondary | ICD-10-CM | POA: Insufficient documentation

## 2013-11-14 DIAGNOSIS — Z87442 Personal history of urinary calculi: Secondary | ICD-10-CM | POA: Insufficient documentation

## 2013-11-14 DIAGNOSIS — G43909 Migraine, unspecified, not intractable, without status migrainosus: Secondary | ICD-10-CM | POA: Insufficient documentation

## 2013-11-14 DIAGNOSIS — E785 Hyperlipidemia, unspecified: Secondary | ICD-10-CM | POA: Insufficient documentation

## 2013-11-14 DIAGNOSIS — G8929 Other chronic pain: Secondary | ICD-10-CM | POA: Insufficient documentation

## 2013-11-14 DIAGNOSIS — M79609 Pain in unspecified limb: Secondary | ICD-10-CM

## 2013-11-14 DIAGNOSIS — L03119 Cellulitis of unspecified part of limb: Principal | ICD-10-CM

## 2013-11-14 DIAGNOSIS — I10 Essential (primary) hypertension: Secondary | ICD-10-CM | POA: Insufficient documentation

## 2013-11-14 DIAGNOSIS — M25569 Pain in unspecified knee: Secondary | ICD-10-CM

## 2013-11-14 DIAGNOSIS — Z8719 Personal history of other diseases of the digestive system: Secondary | ICD-10-CM | POA: Insufficient documentation

## 2013-11-14 DIAGNOSIS — M7989 Other specified soft tissue disorders: Secondary | ICD-10-CM

## 2013-11-14 DIAGNOSIS — Z794 Long term (current) use of insulin: Secondary | ICD-10-CM | POA: Insufficient documentation

## 2013-11-14 DIAGNOSIS — L03115 Cellulitis of right lower limb: Secondary | ICD-10-CM

## 2013-11-14 DIAGNOSIS — J45909 Unspecified asthma, uncomplicated: Secondary | ICD-10-CM | POA: Insufficient documentation

## 2013-11-14 DIAGNOSIS — R609 Edema, unspecified: Secondary | ICD-10-CM

## 2013-11-14 DIAGNOSIS — Z79899 Other long term (current) drug therapy: Secondary | ICD-10-CM | POA: Insufficient documentation

## 2013-11-14 DIAGNOSIS — M129 Arthropathy, unspecified: Secondary | ICD-10-CM | POA: Insufficient documentation

## 2013-11-14 DIAGNOSIS — M25561 Pain in right knee: Secondary | ICD-10-CM

## 2013-11-14 DIAGNOSIS — E1139 Type 2 diabetes mellitus with other diabetic ophthalmic complication: Secondary | ICD-10-CM | POA: Insufficient documentation

## 2013-11-14 DIAGNOSIS — F0391 Unspecified dementia with behavioral disturbance: Secondary | ICD-10-CM

## 2013-11-14 DIAGNOSIS — Z862 Personal history of diseases of the blood and blood-forming organs and certain disorders involving the immune mechanism: Secondary | ICD-10-CM | POA: Insufficient documentation

## 2013-11-14 DIAGNOSIS — G894 Chronic pain syndrome: Secondary | ICD-10-CM

## 2013-11-14 DIAGNOSIS — E11319 Type 2 diabetes mellitus with unspecified diabetic retinopathy without macular edema: Secondary | ICD-10-CM | POA: Insufficient documentation

## 2013-11-14 DIAGNOSIS — F0392 Unspecified dementia, unspecified severity, with psychotic disturbance: Secondary | ICD-10-CM

## 2013-11-14 DIAGNOSIS — L02419 Cutaneous abscess of limb, unspecified: Secondary | ICD-10-CM | POA: Insufficient documentation

## 2013-11-14 DIAGNOSIS — Z8739 Personal history of other diseases of the musculoskeletal system and connective tissue: Secondary | ICD-10-CM | POA: Insufficient documentation

## 2013-11-14 LAB — CBC WITH DIFFERENTIAL/PLATELET
Basophils Absolute: 0 10*3/uL (ref 0.0–0.1)
Basophils Relative: 1 % (ref 0–1)
Eosinophils Absolute: 0.4 10*3/uL (ref 0.0–0.7)
Eosinophils Relative: 6 % — ABNORMAL HIGH (ref 0–5)
HCT: 29.6 % — ABNORMAL LOW (ref 36.0–46.0)
HEMOGLOBIN: 9.7 g/dL — AB (ref 12.0–15.0)
LYMPHS ABS: 1.6 10*3/uL (ref 0.7–4.0)
Lymphocytes Relative: 25 % (ref 12–46)
MCH: 28.7 pg (ref 26.0–34.0)
MCHC: 32.8 g/dL (ref 30.0–36.0)
MCV: 87.6 fL (ref 78.0–100.0)
Monocytes Absolute: 0.8 10*3/uL (ref 0.1–1.0)
Monocytes Relative: 12 % (ref 3–12)
Neutro Abs: 3.6 10*3/uL (ref 1.7–7.7)
Neutrophils Relative %: 56 % (ref 43–77)
Platelets: 256 10*3/uL (ref 150–400)
RBC: 3.38 MIL/uL — ABNORMAL LOW (ref 3.87–5.11)
RDW: 15.8 % — ABNORMAL HIGH (ref 11.5–15.5)
WBC: 6.4 10*3/uL (ref 4.0–10.5)

## 2013-11-14 LAB — PROTIME-INR
INR: 1.09 (ref 0.00–1.49)
Prothrombin Time: 13.9 seconds (ref 11.6–15.2)

## 2013-11-14 LAB — APTT: aPTT: 37 seconds (ref 24–37)

## 2013-11-14 LAB — BASIC METABOLIC PANEL
BUN: 31 mg/dL — ABNORMAL HIGH (ref 6–23)
CO2: 26 mEq/L (ref 19–32)
Calcium: 10.1 mg/dL (ref 8.4–10.5)
Chloride: 105 mEq/L (ref 96–112)
Creatinine, Ser: 1.04 mg/dL (ref 0.50–1.10)
GFR calc Af Amer: 61 mL/min — ABNORMAL LOW (ref >90)
GFR, EST NON AFRICAN AMERICAN: 52 mL/min — AB (ref >90)
GLUCOSE: 145 mg/dL — AB (ref 70–99)
Potassium: 4.7 mEq/L (ref 3.7–5.3)
Sodium: 142 mEq/L (ref 137–147)

## 2013-11-14 MED ORDER — CLINDAMYCIN HCL 300 MG PO CAPS
300.0000 mg | ORAL_CAPSULE | Freq: Once | ORAL | Status: AC
  Administered 2013-11-14: 300 mg via ORAL
  Filled 2013-11-14: qty 2
  Filled 2013-11-14: qty 1

## 2013-11-14 MED ORDER — HYDROCODONE-ACETAMINOPHEN 5-325 MG PO TABS
1.0000 | ORAL_TABLET | Freq: Once | ORAL | Status: AC
  Administered 2013-11-14: 1 via ORAL
  Filled 2013-11-14: qty 1

## 2013-11-14 MED ORDER — CLINDAMYCIN HCL 300 MG PO CAPS: 300.0000 mg | ORAL_CAPSULE | Freq: Three times a day (TID) | ORAL | Status: DC

## 2013-11-14 NOTE — ED Notes (Signed)
Patient sitting in bed comfortable with family at bedside.
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
## 2013-11-14 NOTE — Progress Notes (Signed)
VASCULAR LAB PRELIMINARY  PRELIMINARY  PRELIMINARY  PRELIMINARY  Right lower extremity venous duplex completed.    Preliminary report:  Right:  No evidence of DVT, superficial thrombosis, or Baker's cyst.  [place], RVS 11/14/2013, 2:38 PM

## 2013-11-14 NOTE — ED Notes (Signed)
Patient transported to Ultrasound 

## 2013-11-14 NOTE — Discharge Instructions (Signed)
Elevate you feet above your heart, as much as possible. See your doctor for a checkup 3 days. Return here, if needed, for problems.  Cellulitis Cellulitis is an infection of the skin and the tissue beneath it. The infected area is usually red and tender. Cellulitis occurs most often in the arms and lower legs.  CAUSES  Cellulitis is caused by bacteria that enter the skin through cracks or cuts in the skin. The most common types of bacteria that cause cellulitis are Staphylococcus and Streptococcus. SYMPTOMS   Redness and warmth.  Swelling.  Tenderness or pain.  Fever. DIAGNOSIS  Your caregiver can usually determine what is wrong based on a physical exam. Blood tests may also be done. TREATMENT  Treatment usually involves taking an antibiotic medicine. HOME CARE INSTRUCTIONS   Take your antibiotics as directed. Finish them even if you start to feel better.  Keep the infected arm or leg elevated to reduce swelling.  Apply a warm cloth to the affected area up to 4 times per day to relieve pain.  Only take over-the-counter or prescription medicines for pain, discomfort, or fever as directed by your caregiver.  Keep all follow-up appointments as directed by your caregiver. SEEK MEDICAL CARE IF:   You notice red streaks coming from the infected area.  Your red area gets larger or turns dark in color.  Your bone or joint underneath the infected area becomes painful after the skin has healed.  Your infection returns in the same area or another area.  You notice a swollen bump in the infected area.  You develop new symptoms. SEEK IMMEDIATE MEDICAL CARE IF:   You have a fever.  You feel very sleepy.  You develop vomiting or diarrhea.  You have a general ill feeling (malaise) with muscle aches and pains. MAKE SURE YOU:   Understand these instructions.  Will watch your condition.  Will get help right away if you are not doing well or get worse. Document Released:  04/29/2005 Document Revised: 01/19/2012 Document Reviewed: 10/05/2011 [facility] Patient Information 2014 [place].

## 2013-11-14 NOTE — ED Provider Notes (Signed)
CSN: [id number]     Arrival date & time 11/14/13  1002 History   First MD Initiated Contact with Patient 11/14/13 1137     Chief Complaint  Patient presents with  . Leg Pain  . Leg Swelling     (Consider location/radiation/quality/duration/timing/severity/associated sxs/prior Treatment) Patient is a 73 y.o. female presenting with leg pain. The history is provided by the patient.  Leg Pain  She complains of right leg pain and swelling for 3 weeks, since discharge from another hospital. She did admit her for psychiatric reasons. She denies fever, chills, nausea, vomiting, weakness, dizziness, chest pain, shortness of breath or cough. He has never had this previously. There are no other known modifying factors.  Past Medical History  Diagnosis Date  . ANEMIA-NOS 01/18/2008  . 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 ASTHMA, WITH ACUTE EXACERBATION 11/29/2008  . Chronic pain syndrome 01/21/2010  . DIABETIC  RETINOPATHY 05/30/2007  . DM, UNCOMPLICATED, TYPE II, UNCONTROLLED 05/30/2007  . 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 HYPERLIPIDEMIA 05/30/2007  . HYPERTENSION 10/31/2007  . LOW BACK PAIN 01/18/2008  . MORBID OBESITY, HX OF 05/30/2007  . NEPHROLITHIASIS, HX OF 01/18/2008  . SHOULDER PAIN, BILATERAL 04/09/2009  . Migraine headache   . Spinal stenosis   . Bilateral carpal tunnel syndrome   . Shortness of breath     with walking  . Urgency of urination   . Arthritis   . Edema of both legs     takes Lasix  . Numbness and tingling in hands   . GERD (gastroesophageal reflux disease)    Past Surgical History  Procedure Laterality Date  . Cataract extraction    . Cholecystectomy    . Thoracic fusion    . Lumbar disc surgury    . Tubal ligation    . Back surgery    . 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 Diabetes Mother   . Heart disease Mother   . Dementia Mother   . Lung cancer Father   . Heart disease Father   . Diabetes Sister   . Depression Sister    History  Substance Use Topics  . Smoking status: Never Smoker   . Smokeless tobacco: Not on file  . Alcohol Use: No   OB History   Grav Para Term Preterm Abortions TAB SAB Ect Mult Living   5 5   0     5     Review of Systems  All other systems reviewed and are negative.     Allergies  Endocet  Home Medications   Prior to Admission medications   Medication Sig Start Date End Date Taking? Authorizing Provider  albuterol (VENTOLIN HFA) 108 (90 BASE) MCG/ACT inhaler Inhale 2 puffs into the lungs 2 (two) times daily as needed. 10/26/13 10/26/14 Yes Historical Provider, MD  aspirin 81 MG chewable tablet Chew 81 mg by mouth daily.   Yes Historical Provider, MD  cyclobenzaprine (FLEXERIL) 10 MG tablet Take 10 mg by mouth daily as needed for muscle spasms.    Yes Historical Provider, MD  furosemide (LASIX) 40 MG tablet Take 20 mg by mouth daily.   Yes Historical Provider, MD  insulin aspart (NOVOLOG) 100 UNIT/ML injection Inject 37 Units into the skin once.   Yes Historical  Provider, MD  insulin glargine (LANTUS) 100 UNIT/ML injection Inject 15 Units into the skin daily.   Yes Historical Provider, MD  Multiple Vitamins-Minerals (ONE-A-DAY EXTRAS ANTIOXIDANT PO) Take 1 tablet by mouth daily.    Yes Historical Provider, MD  naproxen sodium (ANAPROX) 220 MG tablet Take 440 mg by mouth 2 (two) times daily as needed (pain). For pain   Yes Historical Provider, MD  OVER THE COUNTER MEDICATION Take 2 tablets by mouth daily. To help strengthen bones   Yes Historical Provider, MD  risperiDONE (RISPERDAL) 0.5 MG tablet Take 1 tablet (0.5 mg total) by mouth at bedtime. 11/07/13  Yes Charlcie Cradle, MD  rosuvastatin (CRESTOR) 20 MG tablet Take 10 mg by mouth daily.   Yes Historical Provider, MD   Blood Glucose Monitoring Suppl ([place]) W/DEVICE KIT Use as directed once daily to check blood sugar.  Diagnosis code 250.02 11/01/13   Biagio Borg, MD  glucose blood (ONE TOUCH TEST STRIPS) test strip Use as directed once daily to check blood sugar.  Diagnosis code 250.02 11/01/13   Biagio Borg, MD  ONE [facility] LANCETS MISC Use as directed once daily to check blood sugar.  Diagnosis code 250.02 11/01/13   Biagio Borg, MD  [facility] DELICA LANCETS 94T MISC Use as directed once daily to check blood sugar.  Diagnosis code 250.02. 11/08/13   Biagio Borg, MD   BP 94/51  Pulse 50  Temp(Src) 98 F (36.7 C) (Oral)  Resp 18  SpO2 100% Physical Exam  Nursing note and vitals reviewed. Constitutional: She is oriented to person, place, and time. She appears well-developed and well-nourished.  HENT:  Head: Normocephalic and atraumatic.  Eyes: Conjunctivae and EOM are normal. Pupils are equal, round, and reactive to light.  Neck: Normal range of motion and phonation normal. Neck supple.  Cardiovascular: Normal rate, regular rhythm and intact distal pulses.   Pulmonary/Chest: Effort normal and breath sounds normal. She exhibits no tenderness.  Abdominal: Soft. She exhibits no distension. There is no tenderness. There is no guarding.  Musculoskeletal: Normal range of motion.  Moderate right lower leg swelling, as compared to left with diffuse, mild erythema of the anterior lower shin. There is normal perfusion to the feet, bilaterally.  Normal range of motion both legs, bilaterally. The feet appear normal bilaterally. There is no evidence for a portal of entry, for an infectious process.  Neurological: She is alert and oriented to person, place, and time. She exhibits normal muscle tone.  Skin: Skin is warm and dry.  Psychiatric: She has a normal mood and affect. Her behavior is normal. Judgment and thought content normal.    ED Course  Procedures (including critical care time) Medications   HYDROcodone-acetaminophen (NORCO/VICODIN) 5-325 MG per tablet 1 tablet (1 tablet Oral Given 11/14/13 1247)  clindamycin (CLEOCIN) capsule 300 mg (300 mg Oral Given 11/14/13 1521)    Patient Vitals for the past 24 hrs:  BP Temp Temp src Pulse Resp SpO2  11/14/13 1400 94/51 mmHg - - 50 - 100 %  11/14/13 1245 142/58 mmHg - - 55 - 100 %  11/14/13 1230 113/93 mmHg - - 62 - 100 %  11/14/13 1215 143/54 mmHg - - 58 - 100 %  11/14/13 1200 111/82 mmHg - - 60 - 100 %  11/14/13 1010 145/54 mmHg 98 F (36.7 C) Oral 65 18 99 %    3:02 PM Reevaluation with update and discussion. After initial assessment and treatment, an updated  evaluation reveals she does not have any additional complaints. She is hungry now. Findings of Doppler study discussed with patient and treatment plan agreed on. Richarda Blade    Labs Review Labs Reviewed  CBC WITH DIFFERENTIAL - Abnormal; Notable for the following:    RBC 3.38 (*)    Hemoglobin 9.7 (*)    HCT 29.6 (*)    RDW 15.8 (*)    Eosinophils Relative 6 (*)    All other components within normal limits  BASIC METABOLIC PANEL - Abnormal; Notable for the following:    Glucose, Bld 145 (*)    BUN 31 (*)    GFR calc non Af Amer 52 (*)    GFR calc Af Amer 61 (*)    All other components within normal limits  PROTIME-INR  APTT    Imaging Review No results found.   EKG Interpretation None       Diagnosis: Cellulitis, right lower leg, without foot   MDM    Nonspecific swelling and redness of the right leg, without evidence for DVT. She may have a superficial cellulitis that can be treated as an outpatient, with close followup. Is no evidence for significant hyperglycemia or anion Ketosis.   Nursing Notes Reviewed/ Care Coordinated Applicable Imaging Reviewed Interpretation of Laboratory Data incorporated into ED treatment  The patient appears reasonably screened and/or stabilized for discharge and I doubt any other medical condition or other [facility]  requiring further screening, evaluation, or treatment in the ED at this time prior to discharge.  Plan: Home Medications- Clindamycin; Home Treatments- rest; return here if the recommended treatment, does not improve the symptoms; Recommended follow up- PCP check up in 3 days       Richarda Blade, MD 11/14/13 [phone]

## 2013-11-17 ENCOUNTER — Encounter: Payer: Self-pay | Admitting: Internal Medicine

## 2013-11-17 ENCOUNTER — Ambulatory Visit (INDEPENDENT_AMBULATORY_CARE_PROVIDER_SITE_OTHER): Payer: Medicare Other | Admitting: Internal Medicine

## 2013-11-17 VITALS — BP 138/68 | HR 68 | Temp 97.4°F

## 2013-11-17 DIAGNOSIS — I10 Essential (primary) hypertension: Secondary | ICD-10-CM

## 2013-11-17 DIAGNOSIS — L03119 Cellulitis of unspecified part of limb: Secondary | ICD-10-CM

## 2013-11-17 DIAGNOSIS — L02419 Cutaneous abscess of limb, unspecified: Secondary | ICD-10-CM

## 2013-11-17 DIAGNOSIS — G894 Chronic pain syndrome: Secondary | ICD-10-CM

## 2013-11-17 DIAGNOSIS — L03115 Cellulitis of right lower limb: Secondary | ICD-10-CM

## 2013-11-17 MED ORDER — HYDROCODONE-ACETAMINOPHEN 5-325 MG PO TABS: 1.0000 | ORAL_TABLET | Freq: Four times a day (QID) | ORAL | Status: DC | PRN

## 2013-11-17 NOTE — Patient Instructions (Addendum)
Please take all new medication as prescribed  Please continue all other medications as before, and refills have been done if requested.  Please have the pharmacy call with any other refills you may need.   

## 2013-11-17 NOTE — Assessment & Plan Note (Signed)
stable overall by history and exam, recent data reviewed with pt, and pt to continue medical treatment as before,  to f/u any worsening symptoms or concerns BP Readings from Last 3 Encounters:  11/17/13 138/68  11/14/13 94/51  11/07/13 130/82

## 2013-11-17 NOTE — Progress Notes (Signed)
Subjective:    Patient ID: Holly Weiss, female    DOB: Oct 21, 1940, 73 y.o.   MRN: [id number]  HPI  Here to f/u, seen in ER x 3 days with warm swelling area to right mid leg, tx with cleocin for cellulitis, overall improved today with no further erythema, still mild warm and tender.  Has rx for 7 days total cleocin. No f/c or n/v/d.  Pt denies chest pain, increased sob or doe, wheezing, orthopnea, PND, increased LE swelling, palpitations, dizziness or syncope.  Pt denies new neurological symptoms such as new headache, or facial or extremity weakness or numbness   Pt denies polydipsia, polyuria,  + Dementia at psychiatry recently.  Has ongoing chronic pain.  Endoocet causes itching.  The one pain pill rx in ER worked well, but cant recall name.   Past Medical History  Diagnosis Date  . ANEMIA-NOS 01/18/2008  . 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 ASTHMA, WITH ACUTE EXACERBATION 11/29/2008  . Chronic pain syndrome 01/21/2010  . DIABETIC  RETINOPATHY 05/30/2007  . DM, UNCOMPLICATED, TYPE II, UNCONTROLLED 05/30/2007  . 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 HYPERLIPIDEMIA 05/30/2007  . HYPERTENSION 10/31/2007  . LOW BACK PAIN 01/18/2008  . MORBID OBESITY, HX OF 05/30/2007  . NEPHROLITHIASIS, HX OF 01/18/2008  . SHOULDER PAIN, BILATERAL 04/09/2009  . Migraine headache   . Spinal stenosis   . Bilateral carpal tunnel syndrome   . Shortness of breath     with walking  . Urgency of urination   . Arthritis   . Edema of both legs     takes Lasix  . Numbness and tingling in hands   . GERD (gastroesophageal reflux disease)    Past Surgical History  Procedure Laterality Date  . Cataract extraction    . Cholecystectomy    . Thoracic fusion    . Lumbar disc surgury    . Tubal ligation    . Back surgery    . 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 She does not have any smokeless tobacco history on file. She reports that she does not drink alcohol or use illicit drugs. family history includes Dementia in her mother; Depression in her sister; Diabetes in her mother and sister; Heart disease in her father and mother; Lung cancer in her father. Allergies  Allergen Reactions  . Endocet [Oxycodone-Acetaminophen] Itching   Current Outpatient Prescriptions on File Prior to Visit  Medication Sig Dispense Refill  . albuterol (VENTOLIN HFA) 108 (90 BASE) MCG/ACT inhaler Inhale 2 puffs into the lungs 2 (two) times daily as needed.      Marland Kitchen aspirin 81 MG chewable tablet Chew 81 mg by mouth daily.      . clindamycin (CLEOCIN) 300 MG capsule Take 1 capsule (300 mg total) by mouth 3 (three) times daily. X 7 days  21 capsule  0  . cyclobenzaprine (FLEXERIL) 10 MG tablet Take 10 mg by mouth daily as needed for muscle spasms.       . furosemide (LASIX) 40 MG tablet Take 20 mg by mouth daily.      Marland Kitchen glucose blood (ONE TOUCH TEST STRIPS) test strip Use as directed once daily to check blood sugar.  Diagnosis code 250.02  100 each  11  . insulin aspart (NOVOLOG) 100  UNIT/ML injection Inject 37 Units into the skin once.      . insulin glargine (LANTUS) 100 UNIT/ML injection Inject 15 Units into the skin daily.      . Multiple Vitamins-Minerals (ONE-A-DAY EXTRAS ANTIOXIDANT PO) Take 1 tablet by mouth daily.       . naproxen sodium (ANAPROX) 220 MG tablet Take 440 mg by mouth 2 (two) times daily as needed (pain). For pain      . OVER THE COUNTER MEDICATION Take 2 tablets by mouth daily. To help strengthen bones      . risperiDONE (RISPERDAL) 0.5 MG tablet Take 1 tablet (0.5 mg total) by mouth at bedtime.  30 tablet  1  . rosuvastatin (CRESTOR) 20 MG tablet Take 10 mg by mouth daily.      . Blood Glucose Monitoring Suppl ([place]) W/DEVICE KIT Use as directed once daily to check  blood sugar.  Diagnosis code 250.02  1 each  0  . ONE TOUCH LANCETS MISC Use as directed once daily to check blood sugar.  Diagnosis code 250.02  200 each  11  . ONETOUCH DELICA LANCETS 16X MISC Use as directed once daily to check blood sugar.  Diagnosis code 250.02.  100 each  11   No current facility-administered medications on file prior to visit.    Review of Systems  Constitutional: Negative for unexpected weight change, or unusual diaphoresis  HENT: Negative for tinnitus.   Eyes: Negative for photophobia and visual disturbance.  Respiratory: Negative for choking and stridor.   Gastrointestinal: Negative for vomiting and blood in stool.  Genitourinary: Negative for hematuria and decreased urine volume.  Musculoskeletal: Negative for acute joint swelling Skin: Negative for color change and wound.  Neurological: Negative for tremors and numbness other than noted  Psychiatric/Behavioral: Negative for decreased concentration or  hyperactivity.       Objective:   Physical Exam BP 138/68  Pulse 68  Temp(Src) 97.4 F (36.3 C) (Oral) VS noted,  Constitutional: Pt appears well-developed and well-nourished.  HENT: Head: NCAT.  Right Ear: External ear normal.  Left Ear: External ear normal.  Eyes: Conjunctivae and EOM are normal. Pupils are equal, round, and reactive to light.  Neck: Normal range of motion. Neck supple.  Cardiovascular: Normal rate and regular rhythm.   Pulmonary/Chest: Effort normal and breath sounds normal.  Abd:  Soft, NT, non-distended, + BS Neurological: Pt is alert. At basline confused  Skin: Skin is warm. No erythema. Right mid leg with 3 cm area mild tender, warm, swelling Psychiatric: Pt behavior is normal.    Assessment & Plan:

## 2013-11-17 NOTE — Assessment & Plan Note (Signed)
Clinically improved, afeb, exam improved, to finish antibx

## 2013-11-17 NOTE — Progress Notes (Signed)
Pre visit review using our clinic review tool, if applicable. No additional management support is needed unless otherwise documented below in the visit note. 

## 2013-11-17 NOTE — Assessment & Plan Note (Signed)
[place] for limited hydrocodone 5 325 prn for now, advised we should not do refills for long term for now, this is really b/c of her her more acute right leg pain

## 2013-11-24 ENCOUNTER — Telehealth: Payer: Self-pay

## 2013-11-24 NOTE — Telephone Encounter (Signed)
Relevant patient education mailed to patient.  

## 2013-11-28 ENCOUNTER — Telehealth: Payer: Self-pay | Admitting: Internal Medicine

## 2013-11-28 NOTE — Telephone Encounter (Signed)
Ok for verbal 

## 2013-11-28 NOTE — Telephone Encounter (Signed)
Melanie with [facility] is calling for verbal orders for the patient to have a home health aide, medical social worker to be involved and PT 4 more weeks, 3x a week. She can be reached back at [phone].

## 2013-11-28 NOTE — Telephone Encounter (Signed)
Called  and spoke with Threasa Beards from [facility] about  Her message  and she was  advised that Dr. Jenny Reichmann was ok with the order.

## 2013-12-04 ENCOUNTER — Encounter: Payer: Self-pay | Admitting: Internal Medicine

## 2013-12-12 ENCOUNTER — Other Ambulatory Visit: Payer: Self-pay | Admitting: Internal Medicine

## 2013-12-14 ENCOUNTER — Other Ambulatory Visit: Payer: Self-pay | Admitting: Internal Medicine

## 2013-12-19 ENCOUNTER — Ambulatory Visit (HOSPITAL_COMMUNITY): Payer: Self-pay | Admitting: Psychiatry

## 2013-12-19 ENCOUNTER — Encounter (HOSPITAL_COMMUNITY): Payer: Self-pay | Admitting: Psychiatry

## 2013-12-19 ENCOUNTER — Ambulatory Visit (INDEPENDENT_AMBULATORY_CARE_PROVIDER_SITE_OTHER): Payer: Medicare Other | Admitting: Psychiatry

## 2013-12-19 VITALS — BP 137/53 | HR 60 | Ht 60.0 in | Wt 190.0 lb

## 2013-12-19 DIAGNOSIS — F0392 Unspecified dementia, unspecified severity, with psychotic disturbance: Secondary | ICD-10-CM

## 2013-12-19 DIAGNOSIS — F0390 Unspecified dementia without behavioral disturbance: Secondary | ICD-10-CM

## 2013-12-19 DIAGNOSIS — F29 Unspecified psychosis not due to a substance or known physiological condition: Secondary | ICD-10-CM

## 2013-12-19 DIAGNOSIS — F0391 Unspecified dementia with behavioral disturbance: Secondary | ICD-10-CM

## 2013-12-19 MED ORDER — DONEPEZIL HCL 5 MG PO TABS: 5.0000 mg | ORAL_TABLET | Freq: Every day | ORAL | Status: DC

## 2013-12-19 MED ORDER — OLANZAPINE 2.5 MG PO TABS: 2.5000 mg | ORAL_TABLET | Freq: Every day | ORAL | Status: DC
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
## 2013-12-20 ENCOUNTER — Telehealth: Payer: Self-pay

## 2013-12-20 NOTE — Telephone Encounter (Signed)
[facility] called to get clarification on the patients insulin.  The patient is currently taking Lantus 37 units and Novolog 15 units. Current list states the opposite, but looking back at previous visits and med. Please advise as is some confusion

## 2013-12-20 NOTE — Telephone Encounter (Signed)
Current med list is incorrect.  Robin to correct list - lantus is 37 /day, and novolog 15 qd, and notify caller

## 2013-12-21 NOTE — Telephone Encounter (Signed)
HHRN informed of correct amount to take and did correct medication list.

## 2014-01-15 ENCOUNTER — Encounter (HOSPITAL_COMMUNITY): Payer: Self-pay

## 2014-01-15 ENCOUNTER — Encounter (HOSPITAL_COMMUNITY)
Admission: RE | Admit: 2014-01-15 | Discharge: 2014-01-15 | Disposition: A | Discharge status: Home / Self Care | Payer: Medicare Other | Source: Ambulatory Visit | Attending: Orthopedic Surgery | Admitting: Orthopedic Surgery

## 2014-01-15 ENCOUNTER — Encounter (HOSPITAL_COMMUNITY): Payer: Self-pay | Admitting: Pharmacy Technician

## 2014-01-15 HISTORY — DX: Personal history of other medical treatment: Z92.89

## 2014-01-15 HISTORY — DX: Major depressive disorder, single episode, unspecified: F32.9

## 2014-01-15 HISTORY — DX: Depression, unspecified: F32.A

## 2014-01-15 HISTORY — DX: Personal history of urinary calculi: Z87.442

## 2014-01-15 HISTORY — DX: Other seasonal allergic rhinitis: J30.2

## 2014-01-15 LAB — URINALYSIS, ROUTINE W REFLEX MICROSCOPIC
BILIRUBIN URINE: NEGATIVE
GLUCOSE, UA: NEGATIVE mg/dL
HGB URINE DIPSTICK: NEGATIVE
Ketones, ur: NEGATIVE mg/dL
Nitrite: NEGATIVE
PH: 7 (ref 5.0–8.0)
Protein, ur: NEGATIVE mg/dL
Specific Gravity, Urine: <1.005 — ABNORMAL LOW (ref 1.005–1.030)
Urobilinogen, UA: 0.2 mg/dL (ref 0.0–1.0)

## 2014-01-15 LAB — URINE MICROSCOPIC-ADD ON

## 2014-01-15 LAB — CBC
HEMATOCRIT: 31.4 % — AB (ref 36.0–46.0)
HEMOGLOBIN: 10.3 g/dL — AB (ref 12.0–15.0)
MCH: 29.3 pg (ref 26.0–34.0)
MCHC: 32.8 g/dL (ref 30.0–36.0)
MCV: 89.5 fL (ref 78.0–100.0)
Platelets: 181 10*3/uL (ref 150–400)
RBC: 3.51 MIL/uL — AB (ref 3.87–5.11)
RDW: 16.1 % — ABNORMAL HIGH (ref 11.5–15.5)
WBC: 7.1 10*3/uL (ref 4.0–10.5)

## 2014-01-15 LAB — BASIC METABOLIC PANEL
BUN: 26 mg/dL — AB (ref 6–23)
CHLORIDE: 105 meq/L (ref 96–112)
CO2: 25 mEq/L (ref 19–32)
Calcium: 10.2 mg/dL (ref 8.4–10.5)
Creatinine, Ser: 0.92 mg/dL (ref 0.50–1.10)
GFR calc non Af Amer: 60 mL/min — ABNORMAL LOW (ref >90)
GFR, EST AFRICAN AMERICAN: 70 mL/min — AB (ref >90)
Glucose, Bld: 104 mg/dL — ABNORMAL HIGH (ref 70–99)
POTASSIUM: 4.3 meq/L (ref 3.7–5.3)
Sodium: 142 mEq/L (ref 137–147)

## 2014-01-15 LAB — PROTIME-INR
INR: 1.11 (ref 0.00–1.49)
Prothrombin Time: 14.1 seconds (ref 11.6–15.2)

## 2014-01-15 LAB — TYPE AND SCREEN
ABO/RH(D): A POS
ANTIBODY SCREEN: NEGATIVE

## 2014-01-15 MED ORDER — CEFAZOLIN SODIUM-DEXTROSE 2-3 GM-% IV SOLR
2.0000 g | INTRAVENOUS | Status: DC
  Filled 2014-01-15: qty 50

## 2014-01-15 MED ORDER — ACETAMINOPHEN 500 MG PO TABS
1000.0000 mg | ORAL_TABLET | Freq: Once | ORAL | Status: AC
  Administered 2014-01-16: 1000 mg via ORAL
  Filled 2014-01-15: qty 2

## 2014-01-15 MED ORDER — DEXTROSE-NACL 5-0.45 % IV SOLN: 100.0000 mL/h | INTRAVENOUS | Status: DC

## 2014-01-15 MED ORDER — CHLORHEXIDINE GLUCONATE 4 % EX LIQD
60.0000 mL | Freq: Once | CUTANEOUS | Status: DC
  Filled 2014-01-15: qty 60

## 2014-01-15 NOTE — Pre-Procedure Instructions (Signed)
Holly Weiss  01/15/2014   Your procedure is scheduled on: Tuesday,  June 16.  Report to [facility] Admitting at  8:00 AM.  Call this number if you have problems the morning of surgery: [phone]   Remember:   Do not eat food or drink liquids after midnight.   Take these medicines the morning of surgery with A SIP OF WATER: Use Inhaler if needed and bring it to the hospital with you.                Take if needed:cyclobenzaprine (FLEXERIL), HYDROcodone-acetaminophen (NORCO/VICODIN).                 DO NOT take any Insulin the morning of surgery.                   Stop taking Vitamins and naproxen sodium (ANAPROX).     Do not wear jewelry, make-up or nail polish.  Do not wear lotions, powders, or perfumes.   Do not shave 48 hours prior to surgery.   Do not bring valuables to the hospital.               [facility] is not responsible for any belongings or valuables.               Contacts, dentures or bridgework may not be worn into surgery.  Leave suitcase in the car. After surgery it may be brought to your room.  For patients admitted to the hospital, discharge time is determined by your treatment team.               Patients discharged the day of surgery will not be allowed to drive home.  Name and phone number of your driver: -   Special Instructions: -Review  [facility] - Preparing For Surgery.   Please read over the following fact sheets that you were given: Pain Booklet, Coughing and Deep Breathing, Blood Transfusion Information and Surgical Site Infection Prevention

## 2014-01-15 NOTE — H&P (Signed)
  [facility] [address] [place], [place] [address] [phone] A Division of [facility]  Ninetta Lights, M.D.   Robert A. Noemi Chapel, M.D.   Faythe Casa, M.D.   Johnny Bridge, M.D.   Almedia Balls, M.D Ernesta Amble. Percell Miller, M.D.  Joseph Pierini, M.D.  Lanier Prude, M.D.    Verner Chol, M.D. Mary L. Fenton Malling, PA-C  Kirstin A. Shepperson, PA-C  Josh Val[place], PA-C [place], [place]   RE: [place], [place]   [id number]      DOB: Dec 01, 1940 PROGRESS NOTE: 01-12-14 Reason for visit:  Evaluate tibial nonunion. History of present illness: She underwent a stemmed tibial prosthesis for total knee arthroplasty on 07/12/13 with Dr. Amada Jupiter. At the time he noted a small crack through the cortex but elected to treat this non-operatively. She did well for a while but eventually she went on to progress this crack and later form a fibular stress fracture as the tibia bowed. This has caused her to be unable to bear weight on this side and causing significant pain.   Please see associated documentation for this clinic visit for further past medical, family, surgical and social history, review of systems, and exam findings as this was reviewed by me.  EXAMINATION: Well appearing female in no apparent distress. The right lower extremity is neurovascularly intact. There is obvious bowing of the tibia this is apex anterior, she has painless range of motion of the knee and has full extension of the knee.  Well healed incision at the knee.  IMAGING: Review of previous films demonstrate roughly 40 degree apex anterior bow of the tibia.   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  PLAN: I had a lengthy discussion with her given her options. She always has the option of doing nothing, we could try continued bracing with modalities to get her bone to  heal, however her alignment has made it very difficult for her to ambulate and she would like to go forward with operative treatment. I think the best plan going forward would be a 3-5 locked plate on the anterolateral side with local bone grafting. I will check her vitamin D and calcium levels today and we will get her on supplementation as soon as possible. I am also going to get her set up for a bone stimulator to treat this nonunion of her tibia. I will set her up to be held overnight at the hospital.   Christia Reading D.  Percell Miller, M.D.  Electronically verified by Ernesta Amble. Percell Miller, M.D. TDM:kah D 01-12-14 T 01-15-14

## 2014-01-16 ENCOUNTER — Ambulatory Visit (HOSPITAL_COMMUNITY): Payer: Medicare Other | Admitting: Anesthesiology

## 2014-01-16 ENCOUNTER — Observation Stay (HOSPITAL_COMMUNITY): Payer: Medicare Other

## 2014-01-16 ENCOUNTER — Encounter (HOSPITAL_COMMUNITY): Payer: Self-pay | Admitting: Anesthesiology

## 2014-01-16 ENCOUNTER — Observation Stay (HOSPITAL_COMMUNITY)
Admission: RE | Admit: 2014-01-16 | Discharge: 2014-01-17 | Disposition: A | Discharge status: Home / Self Care | Payer: Medicare Other | Source: Ambulatory Visit | Attending: Orthopedic Surgery | Admitting: Orthopedic Surgery

## 2014-01-16 ENCOUNTER — Encounter (HOSPITAL_COMMUNITY)
Admission: RE | Disposition: A | Discharge status: Home / Self Care | Payer: Self-pay | Source: Ambulatory Visit | Attending: Orthopedic Surgery

## 2014-01-16 ENCOUNTER — Encounter (HOSPITAL_COMMUNITY): Payer: Medicare Other | Admitting: Anesthesiology

## 2014-01-16 DIAGNOSIS — E785 Hyperlipidemia, unspecified: Secondary | ICD-10-CM | POA: Insufficient documentation

## 2014-01-16 DIAGNOSIS — I10 Essential (primary) hypertension: Secondary | ICD-10-CM | POA: Insufficient documentation

## 2014-01-16 DIAGNOSIS — F3289 Other specified depressive episodes: Secondary | ICD-10-CM | POA: Insufficient documentation

## 2014-01-16 DIAGNOSIS — Z96659 Presence of unspecified artificial knee joint: Secondary | ICD-10-CM | POA: Insufficient documentation

## 2014-01-16 DIAGNOSIS — IMO0002 Reserved for concepts with insufficient information to code with codable children: Principal | ICD-10-CM | POA: Insufficient documentation

## 2014-01-16 DIAGNOSIS — Z6837 Body mass index (BMI) 37.0-37.9, adult: Secondary | ICD-10-CM | POA: Insufficient documentation

## 2014-01-16 DIAGNOSIS — E1139 Type 2 diabetes mellitus with other diabetic ophthalmic complication: Secondary | ICD-10-CM | POA: Insufficient documentation

## 2014-01-16 DIAGNOSIS — K219 Gastro-esophageal reflux disease without esophagitis: Secondary | ICD-10-CM | POA: Insufficient documentation

## 2014-01-16 DIAGNOSIS — S82209A Unspecified fracture of shaft of unspecified tibia, initial encounter for closed fracture: Secondary | ICD-10-CM | POA: Diagnosis present

## 2014-01-16 DIAGNOSIS — I509 Heart failure, unspecified: Secondary | ICD-10-CM | POA: Insufficient documentation

## 2014-01-16 DIAGNOSIS — E11319 Type 2 diabetes mellitus with unspecified diabetic retinopathy without macular edema: Secondary | ICD-10-CM | POA: Insufficient documentation

## 2014-01-16 DIAGNOSIS — Z01812 Encounter for preprocedural laboratory examination: Secondary | ICD-10-CM | POA: Insufficient documentation

## 2014-01-16 DIAGNOSIS — F329 Major depressive disorder, single episode, unspecified: Secondary | ICD-10-CM | POA: Insufficient documentation

## 2014-01-16 DIAGNOSIS — G43909 Migraine, unspecified, not intractable, without status migrainosus: Secondary | ICD-10-CM | POA: Insufficient documentation

## 2014-01-16 DIAGNOSIS — Z7901 Long term (current) use of anticoagulants: Secondary | ICD-10-CM | POA: Insufficient documentation

## 2014-01-16 DIAGNOSIS — J45909 Unspecified asthma, uncomplicated: Secondary | ICD-10-CM | POA: Insufficient documentation

## 2014-01-16 DIAGNOSIS — G894 Chronic pain syndrome: Secondary | ICD-10-CM | POA: Insufficient documentation

## 2014-01-16 HISTORY — PX: ORIF TIBIA FRACTURE: SHX5416

## 2014-01-16 LAB — GLUCOSE, CAPILLARY
GLUCOSE-CAPILLARY: 189 mg/dL — AB (ref 70–99)
GLUCOSE-CAPILLARY: 187 mg/dL — AB (ref 70–99)
GLUCOSE-CAPILLARY: 168 mg/dL — AB (ref 70–99)
GLUCOSE-CAPILLARY: 192 mg/dL — AB (ref 70–99)
GLUCOSE-CAPILLARY: 220 mg/dL — AB (ref 70–99)
GLUCOSE-CAPILLARY: 225 mg/dL — AB (ref 70–99)

## 2014-01-16 SURGERY — OPEN REDUCTION INTERNAL FIXATION (ORIF) TIBIA FRACTURE
Anesthesia: General | Site: Leg Lower | Laterality: Right

## 2014-01-16 MED ORDER — OXYCODONE HCL 5 MG/5ML PO SOLN: 5.0000 mg | Freq: Once | ORAL | Status: AC | PRN

## 2014-01-16 MED ORDER — HYDROMORPHONE HCL PF 1 MG/ML IJ SOLN
0.2500 mg | INTRAMUSCULAR | Status: DC | PRN
  Administered 2014-01-16 (×3): 0.5 mg via INTRAVENOUS

## 2014-01-16 MED ORDER — INSULIN ASPART 100 UNIT/ML ~~LOC~~ SOLN
0.0000 [IU] | Freq: Every day | SUBCUTANEOUS | Status: DC
  Administered 2014-01-16: 23:00:00 via SUBCUTANEOUS

## 2014-01-16 MED ORDER — 0.9 % SODIUM CHLORIDE (POUR BTL) OPTIME
TOPICAL | Status: DC | PRN
Start: 2014-01-16 — End: 2014-01-16
  Administered 2014-01-16: 1000 mL

## 2014-01-16 MED ORDER — ROCURONIUM BROMIDE 50 MG/5ML IV SOLN
INTRAVENOUS | Status: AC
  Filled 2014-01-16: qty 1

## 2014-01-16 MED ORDER — PROPOFOL 10 MG/ML IV BOLUS
INTRAVENOUS | Status: AC
Start: 2014-01-16 — End: 2014-01-16
  Filled 2014-01-16: qty 20

## 2014-01-16 MED ORDER — SODIUM CHLORIDE 0.9 % IJ SOLN
INTRAMUSCULAR | Status: AC
  Filled 2014-01-16: qty 10

## 2014-01-16 MED ORDER — DEXTROSE 5 % IV SOLN
500.0000 mg | Freq: Four times a day (QID) | INTRAVENOUS | Status: DC | PRN
  Filled 2014-01-16: qty 5

## 2014-01-16 MED ORDER — FENTANYL CITRATE 0.05 MG/ML IJ SOLN
INTRAMUSCULAR | Status: AC
  Filled 2014-01-16: qty 2

## 2014-01-16 MED ORDER — ALBUTEROL SULFATE (2.5 MG/3ML) 0.083% IN NEBU: 2.5000 mg | INHALATION_SOLUTION | Freq: Two times a day (BID) | RESPIRATORY_TRACT | Status: DC | PRN

## 2014-01-16 MED ORDER — PROPOFOL 10 MG/ML IV BOLUS
INTRAVENOUS | Status: DC | PRN
  Administered 2014-01-16: 150 mg via INTRAVENOUS

## 2014-01-16 MED ORDER — CEFAZOLIN SODIUM-DEXTROSE 2-3 GM-% IV SOLR
2.0000 g | Freq: Four times a day (QID) | INTRAVENOUS | Status: AC
  Administered 2014-01-16 – 2014-01-17 (×3): 2 g via INTRAVENOUS
  Filled 2014-01-16 (×3): qty 50

## 2014-01-16 MED ORDER — METOCLOPRAMIDE HCL 5 MG/ML IJ SOLN: 10.0000 mg | Freq: Once | INTRAMUSCULAR | Status: DC | PRN

## 2014-01-16 MED ORDER — GLYCOPYRROLATE 0.2 MG/ML IJ SOLN
INTRAMUSCULAR | Status: AC
  Filled 2014-01-16: qty 2

## 2014-01-16 MED ORDER — HYDROMORPHONE HCL PF 1 MG/ML IJ SOLN
INTRAMUSCULAR | Status: AC
  Administered 2014-01-16: 0.5 mg via INTRAVENOUS
  Filled 2014-01-16: qty 1

## 2014-01-16 MED ORDER — ROCURONIUM BROMIDE 100 MG/10ML IV SOLN
INTRAVENOUS | Status: DC | PRN
  Administered 2014-01-16: 50 mg via INTRAVENOUS

## 2014-01-16 MED ORDER — DONEPEZIL HCL 5 MG PO TABS
2.5000 mg | ORAL_TABLET | Freq: Every day | ORAL | Status: DC
  Filled 2014-01-16 (×2): qty 0.5

## 2014-01-16 MED ORDER — NEOSTIGMINE METHYLSULFATE 10 MG/10ML IV SOLN
INTRAVENOUS | Status: AC
  Filled 2014-01-16: qty 1

## 2014-01-16 MED ORDER — CYCLOBENZAPRINE HCL 10 MG PO TABS: 10.0000 mg | ORAL_TABLET | Freq: Every day | ORAL | Status: DC | PRN

## 2014-01-16 MED ORDER — ATORVASTATIN CALCIUM 10 MG PO TABS
10.0000 mg | ORAL_TABLET | Freq: Every day | ORAL | Status: DC
  Administered 2014-01-16 – 2014-01-17 (×2): 10 mg via ORAL
  Filled 2014-01-16 (×2): qty 1

## 2014-01-16 MED ORDER — LACTATED RINGERS IV SOLN
INTRAVENOUS | Status: DC
  Administered 2014-01-16: 09:00:00 via INTRAVENOUS

## 2014-01-16 MED ORDER — EPHEDRINE SULFATE 50 MG/ML IJ SOLN
INTRAMUSCULAR | Status: DC | PRN
  Administered 2014-01-16: 10 mg via INTRAVENOUS

## 2014-01-16 MED ORDER — MIDAZOLAM HCL 5 MG/5ML IJ SOLN
INTRAMUSCULAR | Status: DC | PRN
  Administered 2014-01-16: 1 mg via INTRAVENOUS

## 2014-01-16 MED ORDER — DOCUSATE SODIUM 100 MG PO CAPS
100.0000 mg | ORAL_CAPSULE | Freq: Two times a day (BID) | ORAL | Status: DC
  Administered 2014-01-16 – 2014-01-17 (×2): 100 mg via ORAL
  Filled 2014-01-16 (×4): qty 1

## 2014-01-16 MED ORDER — METHOCARBAMOL 500 MG PO TABS
500.0000 mg | ORAL_TABLET | Freq: Four times a day (QID) | ORAL | Status: DC | PRN
  Administered 2014-01-16 – 2014-01-17 (×4): 500 mg via ORAL
  Filled 2014-01-16 (×4): qty 1

## 2014-01-16 MED ORDER — FUROSEMIDE 20 MG PO TABS
20.0000 mg | ORAL_TABLET | Freq: Every day | ORAL | Status: DC
  Administered 2014-01-16 – 2014-01-17 (×2): 20 mg via ORAL
  Filled 2014-01-16 (×2): qty 1

## 2014-01-16 MED ORDER — ASPIRIN 325 MG PO TABS
325.0000 mg | ORAL_TABLET | Freq: Every day | ORAL | Status: DC
  Administered 2014-01-16 – 2014-01-17 (×2): 325 mg via ORAL
  Filled 2014-01-16 (×2): qty 1

## 2014-01-16 MED ORDER — ONDANSETRON HCL 4 MG/2ML IJ SOLN: 4.0000 mg | Freq: Four times a day (QID) | INTRAMUSCULAR | Status: DC | PRN

## 2014-01-16 MED ORDER — LIDOCAINE HCL (CARDIAC) 20 MG/ML IV SOLN
INTRAVENOUS | Status: AC
  Filled 2014-01-16: qty 5

## 2014-01-16 MED ORDER — OXYCODONE HCL 5 MG PO TABS
ORAL_TABLET | ORAL | Status: AC
  Filled 2014-01-16: qty 1

## 2014-01-16 MED ORDER — OLANZAPINE 2.5 MG PO TABS
2.5000 mg | ORAL_TABLET | Freq: Every day | ORAL | Status: DC
  Administered 2014-01-16: 2.5 mg via ORAL
  Filled 2014-01-16 (×2): qty 1

## 2014-01-16 MED ORDER — LACTATED RINGERS IV SOLN
INTRAVENOUS | Status: DC | PRN
  Administered 2014-01-16 (×2): via INTRAVENOUS

## 2014-01-16 MED ORDER — FENTANYL CITRATE 0.05 MG/ML IJ SOLN
INTRAMUSCULAR | Status: AC
Start: 2014-01-16 — End: 2014-01-16
  Filled 2014-01-16: qty 5

## 2014-01-16 MED ORDER — INSULIN ASPART 100 UNIT/ML ~~LOC~~ SOLN
0.0000 [IU] | Freq: Three times a day (TID) | SUBCUTANEOUS | Status: DC
  Administered 2014-01-16: 5 [IU] via SUBCUTANEOUS
  Administered 2014-01-17: 3 [IU] via SUBCUTANEOUS
  Administered 2014-01-17: 5 [IU] via SUBCUTANEOUS

## 2014-01-16 MED ORDER — OXYCODONE-ACETAMINOPHEN 5-325 MG PO TABS
1.0000 | ORAL_TABLET | ORAL | Status: DC | PRN
  Administered 2014-01-16 – 2014-01-17 (×4): 2 via ORAL
  Filled 2014-01-16 (×4): qty 2

## 2014-01-16 MED ORDER — INSULIN ASPART 100 UNIT/ML ~~LOC~~ SOLN
4.0000 [IU] | Freq: Three times a day (TID) | SUBCUTANEOUS | Status: DC
  Administered 2014-01-16 – 2014-01-17 (×3): 4 [IU] via SUBCUTANEOUS

## 2014-01-16 MED ORDER — METOCLOPRAMIDE HCL 5 MG PO TABS
5.0000 mg | ORAL_TABLET | Freq: Three times a day (TID) | ORAL | Status: DC | PRN
  Filled 2014-01-16: qty 2

## 2014-01-16 MED ORDER — DEXTROSE-NACL 5-0.45 % IV SOLN: INTRAVENOUS | Status: AC

## 2014-01-16 MED ORDER — EPHEDRINE SULFATE 50 MG/ML IJ SOLN
INTRAMUSCULAR | Status: AC
  Filled 2014-01-16: qty 1

## 2014-01-16 MED ORDER — GLYCOPYRROLATE 0.2 MG/ML IJ SOLN
INTRAMUSCULAR | Status: DC | PRN
  Administered 2014-01-16: 0.4 mg via INTRAVENOUS

## 2014-01-16 MED ORDER — MORPHINE SULFATE 2 MG/ML IJ SOLN
1.0000 mg | INTRAMUSCULAR | Status: DC | PRN
  Administered 2014-01-16 (×2): 1 mg via INTRAVENOUS
  Filled 2014-01-16 (×2): qty 1

## 2014-01-16 MED ORDER — FENTANYL CITRATE 0.05 MG/ML IJ SOLN: 50.0000 ug | INTRAMUSCULAR | Status: DC | PRN

## 2014-01-16 MED ORDER — ONDANSETRON HCL 4 MG/2ML IJ SOLN
INTRAMUSCULAR | Status: AC
  Filled 2014-01-16: qty 2

## 2014-01-16 MED ORDER — ONDANSETRON HCL 4 MG/2ML IJ SOLN
INTRAMUSCULAR | Status: DC | PRN
  Administered 2014-01-16: 4 mg via INTRAVENOUS

## 2014-01-16 MED ORDER — METOCLOPRAMIDE HCL 5 MG/ML IJ SOLN: 5.0000 mg | Freq: Three times a day (TID) | INTRAMUSCULAR | Status: DC | PRN

## 2014-01-16 MED ORDER — MIDAZOLAM HCL 2 MG/2ML IJ SOLN
INTRAMUSCULAR | Status: AC
  Filled 2014-01-16: qty 2

## 2014-01-16 MED ORDER — FENTANYL CITRATE 0.05 MG/ML IJ SOLN
INTRAMUSCULAR | Status: AC
  Filled 2014-01-16: qty 5

## 2014-01-16 MED ORDER — LABETALOL HCL 5 MG/ML IV SOLN
INTRAVENOUS | Status: DC | PRN
  Administered 2014-01-16: 5 mg via INTRAVENOUS

## 2014-01-16 MED ORDER — ONDANSETRON HCL 4 MG PO TABS: 4.0000 mg | ORAL_TABLET | Freq: Four times a day (QID) | ORAL | Status: DC | PRN

## 2014-01-16 MED ORDER — HYDROMORPHONE HCL PF 1 MG/ML IJ SOLN
INTRAMUSCULAR | Status: AC
  Filled 2014-01-16: qty 1

## 2014-01-16 MED ORDER — NEOSTIGMINE METHYLSULFATE 10 MG/10ML IV SOLN
INTRAVENOUS | Status: DC | PRN
  Administered 2014-01-16: 3 mg via INTRAVENOUS

## 2014-01-16 MED ORDER — LIDOCAINE HCL (CARDIAC) 20 MG/ML IV SOLN
INTRAVENOUS | Status: DC | PRN
  Administered 2014-01-16: 40 mg via INTRAVENOUS

## 2014-01-16 MED ORDER — MIDAZOLAM HCL 2 MG/2ML IJ SOLN: 1.0000 mg | INTRAMUSCULAR | Status: DC | PRN

## 2014-01-16 MED ORDER — FENTANYL CITRATE 0.05 MG/ML IJ SOLN
INTRAMUSCULAR | Status: DC | PRN
  Administered 2014-01-16 (×2): 50 ug via INTRAVENOUS
  Administered 2014-01-16: 150 ug via INTRAVENOUS
  Administered 2014-01-16: 100 ug via INTRAVENOUS

## 2014-01-16 MED ORDER — OXYCODONE HCL 5 MG PO TABS
5.0000 mg | ORAL_TABLET | Freq: Once | ORAL | Status: AC | PRN
  Administered 2014-01-16: 5 mg via ORAL

## 2014-01-16 SURGICAL SUPPLY — 61 items
BANDAGE ESMARK 6X9 LF (GAUZE/BANDAGES/DRESSINGS) ×1 IMPLANT
BIT DRILL 2.5X125 (BIT) ×2 IMPLANT
BLADE SURG 10 STRL SS (BLADE) ×3 IMPLANT
BLADE SURG 15 STRL LF DISP TIS (BLADE) ×1 IMPLANT
BLADE SURG 15 STRL SS (BLADE) ×3
BNDG CMPR 9X6 STRL LF SNTH (GAUZE/BANDAGES/DRESSINGS) ×1
BNDG CMPR MED 15X6 ELC VLCR LF (GAUZE/BANDAGES/DRESSINGS) ×1
BNDG ELASTIC 6X15 VLCR STRL LF (GAUZE/BANDAGES/DRESSINGS) ×2 IMPLANT
BNDG ESMARK 6X9 LF (GAUZE/BANDAGES/DRESSINGS) ×3
COVER MAYO STAND STRL (DRAPES) ×3 IMPLANT
COVER SURGICAL LIGHT HANDLE (MISCELLANEOUS) ×3 IMPLANT
DRAPE C-ARM 42X72 X-RAY (DRAPES) ×3 IMPLANT
DRAPE IMP U-DRAPE 54X76 (DRAPES) ×6 IMPLANT
DRAPE U-SHAPE 47X51 STRL (DRAPES) ×3 IMPLANT
DRSG ADAPTIC 3X8 NADH LF (GAUZE/BANDAGES/DRESSINGS) ×2 IMPLANT
DRSG PAD ABDOMINAL 8X10 ST (GAUZE/BANDAGES/DRESSINGS) ×12 IMPLANT
ELECT REM PT RETURN 9FT ADLT (ELECTROSURGICAL) ×3
ELECTRODE REM PT RTRN 9FT ADLT (ELECTROSURGICAL) ×1 IMPLANT
GLOVE BIO SURGEON STRL SZ7.5 (GLOVE) ×3 IMPLANT
GLOVE BIOGEL PI IND STRL 8 (GLOVE) ×1 IMPLANT
GLOVE BIOGEL PI INDICATOR 8 (GLOVE) ×4
GLOVE BIOGEL PI ORTHO PRO SZ7 (GLOVE) ×4
GLOVE PI ORTHO PRO STRL SZ7 (GLOVE) IMPLANT
GLOVE SURG SS PI 8.0 STRL IVOR (GLOVE) ×2 IMPLANT
GOWN STRL REUS W/ TWL LRG LVL3 (GOWN DISPOSABLE) ×2 IMPLANT
GOWN STRL REUS W/TWL LRG LVL3 (GOWN DISPOSABLE) ×6
INSERT LOCKING 4.0MM (Insert) ×20 IMPLANT
KIT BASIN OR (CUSTOM PROCEDURE TRAY) ×3 IMPLANT
KIT ROOM TURNOVER OR (KITS) ×3 IMPLANT
MANIFOLD NEPTUNE II (INSTRUMENTS) ×3 IMPLANT
NS IRRIG 1000ML POUR BTL (IV SOLUTION) ×3 IMPLANT
PACK FOAM VITOSS 1.2CC (Orthopedic Implant) ×2 IMPLANT
PACK ORTHO EXTREMITY (CUSTOM PROCEDURE TRAY) ×3 IMPLANT
PAD ARMBOARD 7.5X6 YLW CONV (MISCELLANEOUS) ×6 IMPLANT
PAD CAST 4YDX4 CTTN HI CHSV (CAST SUPPLIES) IMPLANT
PADDING CAST ABS 6INX4YD NS (CAST SUPPLIES) ×6
PADDING CAST ABS COTTON 6X4 NS (CAST SUPPLIES) IMPLANT
PADDING CAST COTTON 4X4 STRL (CAST SUPPLIES) ×12
PLATE COMPRESSION 10H WAISTED (Plate) ×2 IMPLANT
SCREW CORTEX ST MATTA 3.5X24 (Screw) ×2 IMPLANT
SCREW CORTEX ST MATTA 3.5X32MM (Screw) ×2 IMPLANT
SCREW CORTEX ST MATTA 3.5X34MM (Screw) ×10 IMPLANT
SCREW CORTEX ST MATTA 3.5X36MM (Screw) ×4 IMPLANT
SCREW CORTEX ST MATTA 3.5X38M (Screw) ×4 IMPLANT
SCREW LOCKING 4.0X34 (Screw) ×2 IMPLANT
SPLINT PLASTER CAST XFAST 5X30 (CAST SUPPLIES) IMPLANT
SPLINT PLASTER XFAST SET 5X30 (CAST SUPPLIES) ×2
SPONGE GAUZE 4X4 12PLY (GAUZE/BANDAGES/DRESSINGS) ×3 IMPLANT
SPONGE LAP 18X18 X RAY DECT (DISPOSABLE) ×3 IMPLANT
STOCKINETTE IMPERVIOUS LG (DRAPES) ×3 IMPLANT
SUCTION FRAZIER TIP 10 FR DISP (SUCTIONS) ×3 IMPLANT
SUT MNCRL AB 4-0 PS2 18 (SUTURE) IMPLANT
SUT VIC AB 0 CT1 27 (SUTURE) ×6
SUT VIC AB 0 CT1 27XBRD ANBCTR (SUTURE) ×2 IMPLANT
SYR 20ML ECCENTRIC (SYRINGE) ×2 IMPLANT
TOWEL OR 17X24 6PK STRL BLUE (TOWEL DISPOSABLE) ×3 IMPLANT
TOWEL OR 17X26 10 PK STRL BLUE (TOWEL DISPOSABLE) ×6 IMPLANT
TOWEL OR NON WOVEN STRL DISP B (DISPOSABLE) ×3 IMPLANT
TUBE CONNECTING 12'X1/4 (SUCTIONS) ×1
TUBE CONNECTING 12X1/4 (SUCTIONS) ×2 IMPLANT
YANKAUER SUCT BULB TIP NO VENT (SUCTIONS) ×3 IMPLANT

## 2014-01-16 NOTE — Op Note (Signed)
01/16/2014  12:35 PM  PATIENT:  Holly Weiss    PRE-OPERATIVE DIAGNOSIS:  RIGHT TIBIA MALUNION/NONUNION OF FRACTURE  POST-OPERATIVE DIAGNOSIS:  Same  PROCEDURE:  RIGHT TIBIA REPAIR NON-UNION/MALUNION TIBIA WITH SLIDING GRAFT  SURGEON:  Edmonia Lynch, D, MD  ASSISTANT: Joya Gaskins OPA  ANESTHESIA:   Gen  PREOPERATIVE INDICATIONS:  Holly Weiss is a  73 y.o. female with a diagnosis of RIGHT TIBIA MALUNION/NONUNION OF FRACTURE who failed conservative measures and elected for surgical management.    The risks benefits and alternatives were discussed with the patient preoperatively including but not limited to the risks of infection, bleeding, nerve injury, cardiopulmonary complications, the need for revision surgery, among others, and the patient was willing to proceed.  OPERATIVE IMPLANTS: Stryker 10 hole 3.5 plate  BLOOD LOSS: min  COMPLICATIONS: none  TOURNIQUET TIME: 15min  OPERATIVE PROCEDURE:  Patient was identified in the preoperative holding area and site was marked by me She was transported to the operating theater and placed on the table in supine position taking care to pad all bony prominences. After a preincinduction time out anesthesia was induced. The right extremity was prepped and draped in normal sterile fashion and a pre-incision timeout was performed. She received ancef for preoperative antibiotics.   I made a lateral approach to the fracture site taking my incision off of the crest laterally. I dissected down to the lateral level of the fascia over the anterior compartment I then incised this off the crest anteriorly. I elevated the anterior compartment musculature off of the callus and elevated a path for the plate on the anterolateral aspect of the tibia. I used a combination of a Ronjair and osteotome to take down some of the callus. I then used an osteotome to open up the fracture site it was still mobile on fluoroscopy prior to starting the surgery.  I then  debrided as much callus as I could without devitalizing tissue. I curetted the bone ends. I then placed 1.5 cc of a biologic bone graft from Stryker. I then took multiple fluoroscopic views in line the leg up anatomic position. Next I selected a 10 hole 3.5 plate and placed some locking placement holders. I placed a nonlocking screw proximally and distally at the ends of the bone so as not to create a stress riser. After fixing the plate proximally I used a clamp to reduce the distal shaft over to the plate. At this point I then placed a 2 nonlocking screws in the distal aspect of the shaft. I originally planned on placing 3 locking screws on either end of the fracture however on the distal discovered that the locking inserts did not fit into this plate. Do to the excellent reduction and fixation we have the previous screws I elected to place all nonlocking screws. There are bicortical not excellent bites. A place 5 holes on either side of the fracture.  I then took multiple fluoroscopic views and was happy with the reduction as well as the placement of all hardware. I then thoroughly irrigated the wound I did closed the anterior compartment over top of the plate. I then closed the skin with a nylon stitch and placed a sterile dressing a long-leg splint.. She was taken the PACU in stable condition.  POST OPERATIVE PLAN: NWB RLE, ASA 325 daily for DVT px

## 2014-01-16 NOTE — Transfer of Care (Signed)
Immediate Anesthesia Transfer of Care Note  Patient: Holly Weiss  Procedure(s) Performed: Procedure(s): RIGHT TIBIA REPAIR NON-UNION/MALUNION TIBIA WITH SLIDING GRAFT (Right)  Patient Location: PACU  Anesthesia Type:General  Level of Consciousness: awake, alert  and sedated  Airway & Oxygen Therapy: Patient Spontanous Breathing and Patient connected to face mask oxygen  Post-op Assessment: Report given to PACU RN, Post -op Vital signs reviewed and stable and Patient moving all extremities  Post vital signs: Reviewed  Complications: No apparent anesthesia complications

## 2014-01-16 NOTE — Interval H&P Note (Signed)
History and Physical Interval Note:  01/16/2014 7:27 AM  Holly Weiss  has presented today for surgery, with the diagnosis of RIGHT TIBIA MALUNION/NONUNION OF FRACTURE  The various methods of treatment have been discussed with the patient and family. After consideration of risks, benefits and other options for treatment, the patient has consented to  Procedure(s): RIGHT TIBIA REPAIR NON-UNION/MALUNION TIBIA WITH SLIDING GRAFT (Right) as a surgical intervention .  The patient's history has been reviewed, patient examined, no change in status, stable for surgery.  I have reviewed the patient's chart and labs.  Questions were answered to the patient's satisfaction.     MURPHY, TIMOTHY, D

## 2014-01-16 NOTE — Anesthesia Procedure Notes (Signed)
Date/Time: 01/16/2014 10:36 AM Performed by: Izora Gala Pre-anesthesia Checklist: Patient identified, Emergency Drugs available and Suction available Patient Re-evaluated:Patient Re-evaluated prior to inductionOxygen Delivery Method: Circle system utilized Preoxygenation: Pre-oxygenation with 100% oxygen Intubation Type: IV induction Laryngoscope Size: Miller and 3 Grade View: Grade I Tube type: Oral Tube size: 7.5 mm Number of attempts: 1 Airway Equipment and Method: Stylet and LTA kit utilized Placement Confirmation: ETT inserted through vocal cords under direct vision,  positive ETCO2 and breath sounds checked- equal and bilateral Secured at: 22 cm Dental Injury: Teeth and Oropharynx as per pre-operative assessment

## 2014-01-16 NOTE — Anesthesia Postprocedure Evaluation (Signed)
Anesthesia Post Note  Patient: Holly Weiss  Procedure(s) Performed: Procedure(s) (LRB): RIGHT TIBIA REPAIR NON-UNION/MALUNION TIBIA WITH SLIDING GRAFT (Right)  Anesthesia type: General  Patient location: PACU  Post pain: Pain level controlled  Post assessment: Patient's Cardiovascular Status Stable  Last Vitals:  Filed Vitals:   01/16/14 1400  BP: 111/49  Pulse: 58  Temp:   Resp: 14    Post vital signs: Reviewed and stable  Level of consciousness: alert  Complications: No apparent anesthesia complications

## 2014-01-16 NOTE — Anesthesia Preprocedure Evaluation (Signed)
Anesthesia Evaluation  Patient identified by MRN, date of birth, ID band Patient awake    Reviewed: Allergy & Precautions, H&P , NPO status , Patient's Chart, lab work & pertinent test results, reviewed documented beta blocker date and time   Airway Mallampati: II TM Distance: >3 FB Neck ROM: full    Dental   Pulmonary shortness of breath and with exertion, asthma ,  breath sounds clear to auscultation        Cardiovascular hypertension, On Medications +CHF Rhythm:regular     Neuro/Psych  Headaches, PSYCHIATRIC DISORDERS  Neuromuscular disease    GI/Hepatic Neg liver ROS, GERD-  Medicated and Controlled,  Endo/Other  diabetes, Insulin DependentMorbid obesity  Renal/GU negative Renal ROS  negative genitourinary   Musculoskeletal   Abdominal   Peds  Hematology  (+) anemia ,   Anesthesia Other Findings See surgeon's H&P   Reproductive/Obstetrics negative OB ROS                           Anesthesia Physical Anesthesia Plan  ASA: III  Anesthesia Plan: General   Post-op Pain Management:    Induction: Intravenous  Airway Management Planned: Oral ETT  Additional Equipment:   Intra-op Plan:   Post-operative Plan: Extubation in OR  Informed Consent: I have reviewed the patients History and Physical, chart, labs and discussed the procedure including the risks, benefits and alternatives for the proposed anesthesia with the patient or authorized representative who has indicated his/her understanding and acceptance.   Dental Advisory Given  Plan Discussed with: CRNA and Surgeon  Anesthesia Plan Comments:         Anesthesia Quick Evaluation

## 2014-01-17 LAB — GLUCOSE, CAPILLARY
GLUCOSE-CAPILLARY: 215 mg/dL — AB (ref 70–99)
GLUCOSE-CAPILLARY: 182 mg/dL — AB (ref 70–99)

## 2014-01-17 MED ORDER — ASPIRIN 81 MG PO TABS
81.0000 mg | ORAL_TABLET | Freq: Every day | ORAL | Status: AC
Start: 2014-01-17 — End: ?

## 2014-01-17 MED ORDER — ONDANSETRON HCL 4 MG PO TABS: 4.0000 mg | ORAL_TABLET | Freq: Three times a day (TID) | ORAL | Status: DC | PRN

## 2014-01-17 MED ORDER — METHOCARBAMOL 500 MG PO TABS: 500.0000 mg | ORAL_TABLET | Freq: Four times a day (QID) | ORAL | Status: DC | PRN

## 2014-01-17 MED ORDER — OXYCODONE HCL 5 MG PO TABS: 10.0000 mg | ORAL_TABLET | ORAL | Status: DC | PRN

## 2014-01-17 MED ORDER — DOCUSATE SODIUM 100 MG PO CAPS: 100.0000 mg | ORAL_CAPSULE | Freq: Two times a day (BID) | ORAL | Status: DC

## 2014-01-17 NOTE — Progress Notes (Signed)
Clinical Social Work Department BRIEF PSYCHOSOCIAL ASSESSMENT 01/17/2014  Patient:  Holly Weiss, Holly Weiss     Account Number:  [id number]     Admit date:  01/16/2014  Clinical Social Worker:  Freeman Caldron  Date/Time:  01/17/2014 03:08 PM  Referred by:  Physician  Date Referred:  01/17/2014 Referred for  SNF Placement   Other Referral:   Interview type:  Patient Other interview type:    PSYCHOSOCIAL DATA Living Status:  ALONE Admitted from facility:   Level of care:   Primary support name:  Deirdre Peer ([phone]) Primary support relationship to patient:  CHILD, ADULT Degree of support available:   Good--pt has ample support from children. One son visits her on weekend days and another son visits every day during the week.    CURRENT CONCERNS Current Concerns  Post-Acute Placement   Other Concerns:    SOCIAL WORK ASSESSMENT / PLAN CSW informed pt is ready for discharge today. Pt has Blue Medicare--PT/OT informed they need to evaluate pt. PT/OT put in evaluation. CSW made referrals to SNFs and presented bed offers. Pt selected [facility]--informed facility, and sent discharge summary. CSW faxed FL2 to MD office and received fax back in afternoon. Received insurance auth, and wrote this on packet. RN informed of discharge to SNF and given phone number to call report. CSW answered all of pt's questions and scheduled PTAR for 3:45pm. CSW signed off.   Assessment/plan status:  Psychosocial Support/Ongoing Assessment of Needs Other assessment/ plan:   Information/referral to community resources:   SNF    PATIENT'S/FAMILY'S RESPONSE TO PLAN OF CARE: Good--pt thanked CSW for assistance and is understanding of CSW role in discharge to SNF.       Ky Barban, MSW, [facility] Clinical Social Worker [phone]

## 2014-01-17 NOTE — Evaluation (Signed)
Physical Therapy Evaluation Patient Details Name: Holly Weiss MRN: [id number] DOB: 1940-12-23 Today's Date: 01/17/2014   History of Present Illness  s/p RIGHT TIBIA REPAIR NON-UNION/MALUNION TIBIA WITH SLIDING GRAFT on 01/16/2014.  Clinical Impression  Patient is s/p surgery listed above resulting in functional limitations due to the deficits listed below (see PT Problem List).  Patient will benefit from skilled PT to increase their independence and safety with mobility to allow discharge to the venue listed below. Pt greatly limited in mobility due to NWB status and overall deconditioning. Pt will require SNF for post acute rehab prior to returning home, where she lives alone.       Follow Up Recommendations SNF;Supervision/Assistance - 24 hour    Equipment Recommendations  None recommended by PT    Recommendations for Other Services OT consult     Precautions / Restrictions Precautions Precautions: Fall Required Braces or Orthoses: Other Brace/Splint Other Brace/Splint: splinted  Restrictions Weight Bearing Restrictions: Yes RLE Weight Bearing: Non weight bearing      Mobility  Bed Mobility Overal bed mobility: Needs Assistance;+2 for physical assistance Bed Mobility: Supine to Sit     Supine to sit: Mod assist;+2 for physical assistance;HOB elevated     General bed mobility comments: (A) to advance Rt LE to/off EOB and to elevate trunk; cues for hand placement and sequencing; incr time required due to pain and anxiety   Transfers Overall transfer level: Needs assistance Equipment used: 2 person hand held assist;Rolling walker (2 wheeled) Transfers: Sit to/from [facility] Sit to Stand: +2 physical assistance;Total assist Stand pivot transfers: +2 physical assistance;Total assist       General transfer comment: performed sit to stand x 2; initially with RW; pt with difficulty sequencing with RW and unable to use UEs to (A) with transfer; total (A)  to facilitate and maintain NWB status on rt LE; pt very fearful of falling and anxious  throughout transfer   Ambulation/Gait             General Gait Details: unable to at this time  Stairs            Wheelchair Mobility    Modified Rankin (Stroke Patients Only)       Balance Overall balance assessment: Needs assistance Sitting-balance support: Feet supported;Bilateral upper extremity supported Sitting balance-Leahy Scale: Poor Sitting balance - Comments: UE support due to pain   Standing balance support: During functional activity;Bilateral upper extremity supported Standing balance-Leahy Scale: Zero Standing balance comment: relies heavily on 2 person total (A)                              Pertinent Vitals/Pain 10/10 with activity; patient repositioned for comfort     Home Living Family/patient expects to be discharged to:: Skilled nursing facility Living Arrangements: Alone                    Prior Function Level of Independence: Needs assistance   Gait / Transfers Assistance Needed: pt reports she was ambulating with RW and using w/c as needed  ADL's / Homemaking Assistance Needed: family would come in to (A) pt  Comments: pt poor historian; changes level of (A) and mobility PTA information throughout session     Hand Dominance   Dominant Hand: Right    Extremity/Trunk Assessment   Upper Extremity Assessment: Defer to OT evaluation  Lower Extremity Assessment: RLE deficits/detail      Cervical / Trunk Assessment: Kyphotic  Communication   Communication: No difficulties  Cognition Arousal/Alertness: Awake/alert Behavior During Therapy: Anxious Overall Cognitive Status: No family/caregiver present to determine baseline cognitive functioning Area of Impairment: Following commands;Problem solving     Memory: Decreased recall of precautions Following Commands: Follows one step commands with increased time      Problem Solving: Slow processing;Requires verbal cues;Requires tactile cues;Difficulty sequencing;Decreased initiation      General Comments      Exercises Low Level/ICU Exercises Ankle Circles/Pumps: AROM;Left;10 reps;Supine      Assessment/Plan    PT Assessment Patient needs continued PT services  PT Diagnosis Difficulty walking;Generalized weakness;Acute pain   PT Problem List Decreased strength;Decreased range of motion;Decreased mobility;Decreased balance;Decreased activity tolerance;Decreased knowledge of use of DME;Decreased safety awareness;Decreased cognition;Decreased knowledge of precautions;Impaired sensation;Pain  PT Treatment Interventions DME instruction;Gait training;Functional mobility training;Therapeutic activities;Therapeutic exercise;Balance training;Neuromuscular re-education;Patient/family education;Wheelchair mobility training   PT Goals (Current goals can be found in the Care Plan section) Acute Rehab PT Goals Patient Stated Goal: to get stronger PT Goal Formulation: With patient Time For Goal Achievement: 01/24/14 Potential to Achieve Goals: Good    Frequency Min 3X/week   Barriers to discharge Decreased caregiver support lives alone    Co-evaluation               End of Session Equipment Utilized During Treatment: Gait belt;Oxygen Activity Tolerance: Patient limited by pain;Patient limited by fatigue Patient left: in chair;with call bell/phone within reach Nurse Communication: Mobility status;Precautions;Patient requests pain meds;Weight bearing status    Functional Assessment Tool Used: clinical judgement  Functional Limitation: Mobility: Walking and moving around Mobility: Walking and Moving Around Current Status [phone]): At least 80 percent but less than 100 percent impaired, limited or restricted Mobility: Walking and Moving Around Goal Status [phone]): At least 1 percent but less than 20 percent impaired, limited or restricted     Time: 0857-0913 PT Time Calculation (min): 16 min   Charges:   PT Evaluation $Initial PT Evaluation Tier I: 1 Procedure PT Treatments $Therapeutic Activity: 8-22 mins   PT G Codes:   Functional Assessment Tool Used: clinical judgement  Functional Limitation: Mobility: Walking and moving around    [place], [place], [place]  [phone] 01/17/2014, 10:19 AM

## 2014-01-17 NOTE — Progress Notes (Signed)
Clinical Social Work Department CLINICAL SOCIAL WORK PLACEMENT NOTE 01/17/2014  Patient:  Holly Weiss, Holly Weiss  Account Number:  [id number] Admit date:  01/16/2014  Clinical Social Worker:  Ky Barban, Latanya Presser  Date/time:  01/17/2014 03:11 PM  Clinical Social Work is seeking post-discharge placement for this patient at the following level of care:   SKILLED NURSING   (*CSW will update this form in Epic as items are completed)   01/17/2014  Patient/family provided with [place] Department of Clinical Social Work's list of facilities offering this level of care within the geographic area requested by the patient (or if unable, by the patient's family).  01/17/2014  Patient/family informed of their freedom to choose among providers that offer the needed level of care, that participate in Medicare, Medicaid or managed care program needed by the patient, have an available bed and are willing to accept the patient.  01/17/2014  Patient/family informed of [facility]' ownership interest in [facility], as well as of the fact that they are under no obligation to receive care at this facility.  PASARR submitted to EDS on EXISTING PASARR number received on EXISTING  FL2 transmitted to all facilities in geographic area requested by pt/family on  01/17/2014 FL2 transmitted to all facilities within larger geographic area on   Patient informed that his/her managed care company has contracts with or will negotiate with  certain facilities, including the following:     Patient/family informed of bed offers received:  01/17/2014 Patient chooses bed at [facility] and [place] recommends and patient chooses bed at    Patient to be transferred to [facility] on 01/17/2014  Patient to be transferred to facility by PTAR Patient and family notified of transfer on  Name of family member notified:    The following physician request were entered in Epic:   Additional  Comments: Presented bed offers, and pt selected [facility]. Submitted clinicals to [employer] and received insurance auth. Wrote insurance auth information on pt's discharge packet. Submitted discharge summary to facility and gave RN phone number to call report. Scheduled PTAR for 3:45. CSW signed off.   Ky Barban, MSW, [facility] Clinical Social Worker [phone]

## 2014-01-17 NOTE — Progress Notes (Addendum)
Need PT/OT to evaluate pt for insurance authorization before she can discharge to SNF. Informed RNCM, who has placed consult. CSW starting paperwork and faxing pt out to facilities now to attempt placement today.  Addendum: Sent clinicals to [employer] for insurance auth. Faxed FL2 to MD's office for signature. Pt selected [facility]. Facility informed of discharge today and pt's selection. CSW to facilitate discharge summary once procedural requirements are complete. Full assessment and placement note to follow.  Ky Barban, MSW, [facility] Clinical Social Worker [phone]

## 2014-01-17 NOTE — Progress Notes (Signed)
    Subjective:  Patient reports pain as moderate. Has her home set up for NWB  Objective:   VITALS:   Filed Vitals:   01/16/14 1956 01/17/14 0017 01/17/14 0418 01/17/14 0701  BP: 121/58 137/51 142/58   Pulse: 62 74 83   Temp: 97.6 F (36.4 C) 99.1 F (37.3 C) 100.5 F (38.1 C) 99.5 F (37.5 C)  TempSrc: Oral Oral Oral Oral  Resp: 16 16 18    Height:      Weight:      SpO2: 100% 100% 99%     Physical Exam  Dressing: C/D/I  Compartments soft  SILT DP/SP/S/S/T, 2+DP, +TA/GS/EHL  LABS  Results for orders placed during the hospital encounter of 01/16/14 (from the past 24 hour(s))  GLUCOSE, CAPILLARY     Status: Abnormal   Collection Time    01/16/14  8:22 AM      Result Value Ref Range   Glucose-Capillary 189 (*) 70 - 99 mg/dL  GLUCOSE, CAPILLARY     Status: Abnormal   Collection Time    01/16/14 10:07 AM      Result Value Ref Range   Glucose-Capillary 187 (*) 70 - 99 mg/dL  GLUCOSE, CAPILLARY     Status: Abnormal   Collection Time    01/16/14 12:48 PM      Result Value Ref Range   Glucose-Capillary 168 (*) 70 - 99 mg/dL  GLUCOSE, CAPILLARY     Status: Abnormal   Collection Time    01/16/14  3:02 PM      Result Value Ref Range   Glucose-Capillary 192 (*) 70 - 99 mg/dL  GLUCOSE, CAPILLARY     Status: Abnormal   Collection Time    01/16/14  4:42 PM      Result Value Ref Range   Glucose-Capillary 220 (*) 70 - 99 mg/dL  GLUCOSE, CAPILLARY     Status: Abnormal   Collection Time    01/16/14  9:46 PM      Result Value Ref Range   Glucose-Capillary 225 (*) 70 - 99 mg/dL  GLUCOSE, CAPILLARY     Status: Abnormal   Collection Time    01/17/14  6:43 AM      Result Value Ref Range   Glucose-Capillary 215 (*) 70 - 99 mg/dL     Assessment/Plan: 1 Day Post-Op   Active Problems:   Tibial fracture   PLAN: Weight Bearing: NWB RLE Dressings: Keep splint C/D/I until f/u VTE prophylaxis: ASA 81 daily Dispo: She has been nonweight bearing for several wks and her  home is set up but I am suspicious that she was actually using her leg and now that she is splinted she may struggle to get around. We will see how she does with therapy prior to d/c  Will need an extension for her wheel chair.    Edmonia Lynch, D 01/17/2014, 8:21 AM   Edmonia Lynch, MD Cell [phone]

## 2014-01-17 NOTE — Progress Notes (Signed)
Patient is discharged from room 5N16 at this time. Alert and in stable condition. IV site d/c'd. Report given to nurse Hilda Blades at [facility]. Patient verbalized understanding of being transferred to [facility]. Transported via stretcher by [employer].

## 2014-01-17 NOTE — Discharge Instructions (Signed)
Elevate leg  No weight on your leg

## 2014-01-17 NOTE — Progress Notes (Addendum)
Inpatient Diabetes Program Recommendations  AACE/ADA: New Consensus Statement on Inpatient Glycemic Control (2013)  Target Ranges:  Prepandial:   less than 140 mg/dL      Peak postprandial:   less than 180 mg/dL (1-2 hours)      Critically ill patients:  140 - 180 mg/dL   Inpatient Diabetes Program Recommendations Insulin - Basal: Please consider addition of basal lantus/levemir while here as fasting glucose levels are elevated and would assist in glucose control throughout the day. Can start with 10-15 units. Thank you, Rosita Kea, RN, CNS, Diabetes Coordinator [phone])

## 2014-01-17 NOTE — Care Management Note (Signed)
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     Action/Plan:   Patient will need shortterm rehab at [facility]. Will go to [facility]. Social worker is aware.   Anticipated DC Date:  01/10/2014   Anticipated DC Plan:  SKILLED NURSING FACILITY  In-house referral  Clinical Social Worker      DC Planning Services  CM consult      Choice offered to / List presented to:             Status of service:  Completed, signed off Medicare Important Message given?   (If response is "NO", the following Medicare IM given date fields will be blank) Date Medicare IM given:   Date Additional Medicare IM given:    Discharge Disposition:  SKILLED NURSING FACILITY  Per UR Regulation:    If discussed at Long Length of Stay Meetings, dates discussed:    Comments:

## 2014-01-17 NOTE — Discharge Summary (Signed)
Physician Discharge Summary  Patient ID: Holly Weiss MRN: [id number] DOB/AGE: 73-Nov-1942 73 y.o.  Admit date: 01/16/2014 Discharge date: 01/17/2014  Admission Diagnoses:  <principal problem not specified>  Discharge Diagnoses:  Active Problems:   Tibial fracture   Past Medical History  Diagnosis Date  . ANEMIA-NOS 01/18/2008  . 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 ASTHMA, WITH ACUTE EXACERBATION 11/29/2008  . Chronic pain syndrome 01/21/2010  . DIABETIC  RETINOPATHY 05/30/2007  . DM, UNCOMPLICATED, TYPE II, UNCONTROLLED 05/30/2007  . 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 HYPERLIPIDEMIA 05/30/2007  . HYPERTENSION 10/31/2007  . LOW BACK PAIN 01/18/2008  . MORBID OBESITY, HX OF 05/30/2007  . NEPHROLITHIASIS, HX OF 01/18/2008  . SHOULDER PAIN, BILATERAL 04/09/2009  . Migraine headache   . Spinal stenosis   . Bilateral carpal tunnel syndrome   . Shortness of breath     with walking  . Urgency of urination   . Arthritis   . Edema of both legs     takes Lasix  . Numbness and tingling in hands   . GERD (gastroesophageal reflux disease)   . Femur fracture, right   . Seasonal allergies   . History of kidney stones   . Depression   . History of blood transfusion     spine surgery    Surgeries: Procedure(s): RIGHT TIBIA REPAIR NON-UNION/MALUNION TIBIA WITH SLIDING GRAFT on 01/16/2014   Consultants (if any):    Discharged Condition: Improved  Hospital Course: IRA BUSBIN is an 73 y.o. female who was admitted 01/16/2014 with a diagnosis of <principal problem not specified> and went to the operating room on 01/16/2014 and underwent the above named procedures.    She was given perioperative antibiotics:  Anti-infectives   Start     Dose/Rate Route Frequency Ordered Stop   01/16/14 1500  ceFAZolin (ANCEF) IVPB 2 g/50 mL premix     2 g 100 mL/hr over 30 Minutes Intravenous Every 6 hours 01/16/14 1451 01/17/14 0501   01/16/14 0600  ceFAZolin (ANCEF) IVPB 2 g/50 mL premix  Status:   Discontinued     2 g 100 mL/hr over 30 Minutes Intravenous On call to O.R. 01/15/14 1354 01/16/14 1444    .  She was given sequential compression devices, early ambulation, and ASA 81 for DVT prophylaxis.  She benefited maximally from the hospital stay and there were no complications.    Recent vital signs:  Filed Vitals:   01/17/14 0701  BP:   Pulse:   Temp: 99.5 F (37.5 C)  Resp:     Recent laboratory studies:  Lab Results  Component Value Date   HGB 10.3* 01/15/2014   HGB 9.7* 11/14/2013   HGB 10.7* 10/19/2013   Lab Results  Component Value Date   WBC 7.1 01/15/2014   PLT 181 01/15/2014   Lab Results  Component Value Date   INR 1.11 01/15/2014   Lab Results  Component Value Date   NA 142 01/15/2014   K 4.3 01/15/2014   CL 105 01/15/2014   CO2 25 01/15/2014   BUN 26* 01/15/2014   CREATININE 0.92 01/15/2014   GLUCOSE 104* 01/15/2014    Discharge Medications:     Medication List    STOP taking these medications       HYDROcodone-acetaminophen 5-325 MG per tablet  Commonly known as:  NORCO/VICODIN     naproxen sodium 220 MG tablet  Commonly known as:  ANAPROX      TAKE these medications  aspirin 81 MG tablet  Take 1 tablet (81 mg total) by mouth daily.     aspirin 81 MG chewable tablet  Chew 81 mg by mouth daily.     B-D ULTRAFINE III SHORT PEN 31G X 8 MM Misc  Generic drug:  Insulin Pen Needle  USE AS DIRECTED TWICE A DAY     B-D ULTRAFINE III SHORT PEN 31G X 8 MM Misc  Generic drug:  Insulin Pen Needle  USE AS DIRECTED TWICE A DAY     CALTRATE 600+D PO  Take 2 tablets by mouth daily.     cyclobenzaprine 10 MG tablet  Commonly known as:  FLEXERIL  Take 10 mg by mouth daily as needed for muscle spasms.     docusate sodium 100 MG capsule  Commonly known as:  COLACE  Take 1 capsule (100 mg total) by mouth 2 (two) times daily. Continue this while taking narcotics to help with bowel movements     donepezil 5 MG tablet  Commonly known as:   ARICEPT  Take 2.5 mg by mouth at bedtime.     furosemide 40 MG tablet  Commonly known as:  LASIX  Take 20 mg by mouth daily.     glucose blood test strip  Commonly known as:  ONE TOUCH TEST STRIPS  Use as directed once daily to check blood sugar.  Diagnosis code 250.02     insulin aspart 100 UNIT/ML injection  Commonly known as:  novoLOG  Inject 15 Units into the skin every evening.     insulin glargine 100 UNIT/ML injection  Commonly known as:  LANTUS  Inject 37 Units into the skin every morning.     methocarbamol 500 MG tablet  Commonly known as:  ROBAXIN  Take 1 tablet (500 mg total) by mouth every 6 (six) hours as needed.     OLANZapine 2.5 MG tablet  Commonly known as:  ZYPREXA  Take 1 tablet (2.5 mg total) by mouth at bedtime.     ondansetron 4 MG tablet  Commonly known as:  ZOFRAN  Take 1 tablet (4 mg total) by mouth every 8 (eight) hours as needed for nausea.     ONE TOUCH BASIC SYSTEM W/DEVICE Kit  Use as directed once daily to check blood sugar.  Diagnosis code 250.02     ONE TOUCH LANCETS Misc  Use as directed once daily to check blood sugar.  Diagnosis code 357.01     ONETOUCH DELICA LANCETS 77L Misc  Use as directed once daily to check blood sugar.  Diagnosis code 250.02.     ONE-A-DAY EXTRAS ANTIOXIDANT PO  Take 1 tablet by mouth daily.     oxyCODONE 5 MG immediate release tablet  Commonly known as:  ROXICODONE  Take 2 tablets (10 mg total) by mouth every 4 (four) hours as needed for severe pain.     rosuvastatin 20 MG tablet  Commonly known as:  CRESTOR  Take 10 mg by mouth daily.     VENTOLIN HFA 108 (90 BASE) MCG/ACT inhaler  Generic drug:  albuterol  Inhale 2 puffs into the lungs 2 (two) times daily as needed for wheezing or shortness of breath.        Diagnostic Studies: Dg Tibia/fibula Right  01/16/2014   CLINICAL DATA:  Bb RIGHT tibial repair with nonunion/malunion  EXAM: RIGHT TIBIA AND FIBULA - 2 VIEW  COMPARISON:  Two digital C-arm  fluoroscopic images obtained intraoperatively are compared to preoperative studies of 07/12/2013  FINDINGS:  Images consist of the AP and lateral views of the proximal to mid RIGHT lower leg.  Knee excluded.  Oblique displaced mid fibular fracture.  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  Lateral plate and 10 screws are present post ORIF of the proximal to mid RIGHT tibial diaphysis.  Osseous demineralization.  Question debris versus artifacts projecting medial to the tibia at the surgical site.  IMPRESSION: Post ORIF of the proximal to mid RIGHT tibial diaphysis.  Oblique minimally displaced mid RIGHT fibular diaphyseal fracture.   Electronically Signed   By: Lavonia Dana M.D.   On: 01/16/2014 13:26   Dg Tibia/fibula Right Port  01/16/2014   CLINICAL DATA:  Postop  EXAM: PORTABLE RIGHT TIBIA AND FIBULA - 2 VIEW  COMPARISON:  Intraoperative imaging. Prior tibia and fibula of 07/12/2013.  FINDINGS: Plate and screw fixation of device noted across 8 midshaft right tibial fracture. There is callus formation suggesting this is an old/healing fracture, possibly with nonunion. Mildly displaced midshaft fibular fracture present. Changes of right knee replacement again noted.  IMPRESSION: Plate and screw fixation across a midshaft tibial fracture.   Electronically Signed   By: Rolm Baptise M.D.   On: 01/16/2014 14:31    Disposition: 01-Home or Self Care        Follow-up Information   Follow up with Renette Butters, MD. (as scheduled)    Specialty:  Orthopedic Surgery   Contact information:   [address] [place] [id number] [phone]        Signed: Edmonia Lynch, D 01/17/2014, 7:56 AM

## 2014-01-17 NOTE — Evaluation (Signed)
Occupational Therapy Evaluation Patient Details Name: Holly Weiss MRN: [id number] DOB: Oct 22, 1940 Today's Date: 01/17/2014    History of Present Illness s/p RIGHT TIBIA REPAIR NON-UNION/MALUNION TIBIA WITH SLIDING GRAFT on 01/16/2014.   Clinical Impression   Pt admitted with the above diagnoses and presents with below problem list. Pt will benefit from continued acute  OT to address the below listed deficits and maximize independence with basic ADLs prior to d/c to next venue. Pt needing +2 assistance for most ADLs due to NWB status of affected LE and generalized weakness of BUE. Recommending SNF at d/c to increase independence prior to returning home.      Follow Up Recommendations  SNF    Equipment Recommendations  Other (comment) (defer to next venue)    Recommendations for Other Services       Precautions / Restrictions Precautions Precautions: Fall Required Braces or Orthoses: Other Brace/Splint Other Brace/Splint: splinted  Restrictions Weight Bearing Restrictions: Yes RLE Weight Bearing: Non weight bearing      Mobility Bed Mobility               General bed mobility comments: unable to assess - pt in recliner  Transfers Overall transfer level: Needs assistance                    Balance                                            ADL Overall ADL's : Needs assistance/impaired Eating/Feeding: Set up;Sitting   Grooming: Set up;Sitting   Upper Body Bathing: Min guard;Sitting   Lower Body Bathing: +2 for physical assistance;Sit to/from stand;Sitting/lateral leans   Upper Body Dressing : Set up;Sitting   Lower Body Dressing: +2 for physical assistance;Sit to/from stand   Toilet Transfer: +2 for physical assistance;Stand-pivot;BSC;RW   Toileting- Clothing Manipulation and Hygiene: +2 for physical assistance;Sit to/from stand   Tub/ Shower Transfer: +2 for physical assistance;Stand-pivot;3 in 1;Rolling walker   Functional  mobility during ADLs: +2 for physical assistance;Rolling walker General ADL Comments: Pt needing +2 assist for LB ADLs, transfers, and functional mobility. Educated on NWB status.     Vision                     Perception     Praxis      Pertinent Vitals/Pain No c/o acute pain.     Hand Dominance Right   Extremity/Trunk Assessment Upper Extremity Assessment Upper Extremity Assessment: Generalized weakness;RUE deficits/detail RUE Deficits / Details: pt unable to fully extend 2nd, 3rd, and 4th digits. pt reports this is new since surgery. some very mild edema in both noted. encouraged pt to perform composite extension/flexion of digits to reduce swelling.   Lower Extremity Assessment Lower Extremity Assessment: Defer to PT evaluation       Communication Communication Communication: No difficulties   Cognition Arousal/Alertness: Awake/alert Behavior During Therapy: WFL for tasks assessed/performed Overall Cognitive Status: No family/caregiver present to determine baseline cognitive functioning                     General Comments       Exercises       Shoulder Instructions      Home Living Family/patient expects to be discharged to:: Skilled nursing facility Living Arrangements: Alone  Prior Functioning/Environment Level of Independence: Needs assistance  Gait / Transfers Assistance Needed: rw, w/c as needed ADL's / Homemaking Assistance Needed: daughter came by in the evenings to help pt        OT Diagnosis: Generalized weakness;Acute pain   OT Problem List: Decreased strength;Decreased range of motion;Decreased activity tolerance;Impaired balance (sitting and/or standing);Decreased safety awareness;Decreased knowledge of use of DME or AE;Decreased knowledge of precautions;Obesity;Pain   OT Treatment/Interventions: Self-care/ADL training;Therapeutic exercise;DME and/or AE  instruction;Therapeutic activities;Patient/family education;Balance training    OT Goals(Current goals can be found in the care plan section) Acute Rehab OT Goals Patient Stated Goal: not stated OT Goal Formulation: With patient Time For Goal Achievement: 01/24/14 Potential to Achieve Goals: Good ADL Goals Pt Will Perform Lower Body Bathing: with min assist;with adaptive equipment;sit to/from stand Pt Will Perform Lower Body Dressing: with min assist;with adaptive equipment;sit to/from stand Pt Will Transfer to Toilet: with min assist;bedside commode;stand pivot transfer Pt Will Perform Toileting - Clothing Manipulation and hygiene: with min assist;sit to/from stand Pt Will Perform Tub/Shower Transfer: with min assist;3 in 1;rolling walker;Stand pivot transfer  OT Frequency: Min 2X/week   Barriers to D/C: Decreased caregiver support          Co-evaluation              End of Session Equipment Utilized During Treatment: Gait belt;Rolling walker  Activity Tolerance: Patient tolerated treatment well Patient left: in chair;with call bell/phone within reach   Time: 3664-4034 OT Time Calculation (min): 8 min Charges:  OT General Charges $OT Visit: 1 Procedure OT Evaluation $Initial OT Evaluation Tier I: 1 Procedure G-Codes: OT G-codes **NOT FOR INPATIENT CLASS** Functional Assessment Tool Used: clinical judgement Functional Limitation: Self care Self Care Current Status (V4259): At least 80 percent but less than 100 percent impaired, limited or restricted Self Care Goal Status (D6387): At least 20 percent but less than 40 percent impaired, limited or restricted  Hortencia Pilar 01/17/2014, 3:09 PM

## 2014-01-17 NOTE — Progress Notes (Signed)
UR Completed Brenda Graves-Bigelow, RN,BSN [phone]  

## 2014-01-21 ENCOUNTER — Encounter (HOSPITAL_COMMUNITY): Payer: Self-pay | Admitting: Orthopedic Surgery

## 2014-01-30 ENCOUNTER — Telehealth: Payer: Self-pay | Admitting: *Deleted

## 2014-01-30 DIAGNOSIS — E785 Hyperlipidemia, unspecified: Secondary | ICD-10-CM

## 2014-01-30 DIAGNOSIS — E1165 Type 2 diabetes mellitus with hyperglycemia: Principal | ICD-10-CM

## 2014-01-30 DIAGNOSIS — IMO0001 Reserved for inherently not codable concepts without codable children: Secondary | ICD-10-CM

## 2014-01-30 NOTE — Telephone Encounter (Signed)
Left message on machine for patient to come to lab for a lipid panel.  Order placed.

## 2014-02-01 ENCOUNTER — Ambulatory Visit: Payer: Medicare Other | Admitting: Internal Medicine

## 2014-02-12 ENCOUNTER — Other Ambulatory Visit: Payer: Self-pay | Admitting: Internal Medicine

## 2014-02-12 ENCOUNTER — Telehealth: Payer: Self-pay | Admitting: *Deleted

## 2014-02-12 ENCOUNTER — Telehealth: Payer: Self-pay | Admitting: Internal Medicine

## 2014-02-12 NOTE — Telephone Encounter (Signed)
Pt received a wheelchair from [facility] supply, but it doesn't have a elevating leg rest. Her cast is resting on the foot petal. Needing an order fax stating Pt requires a elevating leg rest to accommodate (R) long leg cast.../lmb

## 2014-02-12 NOTE — Telephone Encounter (Signed)
Sreejesh from [facility] is calling to request a verbal order for physical therapy and also a prescription to be sent to [facility] for a sliding board for the patient to be able to tranfer wheelchairs since she is non-weight bearing on her rt leg. Please advise.

## 2014-02-13 ENCOUNTER — Telehealth: Payer: Self-pay

## 2014-02-13 NOTE — Telephone Encounter (Signed)
OT states PCP needs to write an order for "Drop down Arm Rest commode" sent to [facility] supply, phone number [phone]

## 2014-02-13 NOTE — Telephone Encounter (Signed)
Ok for all, Done hardcopy to [employer]

## 2014-02-13 NOTE — Telephone Encounter (Signed)
Ok, but please request further orders go to her orthopedic

## 2014-02-13 NOTE — Telephone Encounter (Signed)
Done hardcopy to robin  

## 2014-02-14 ENCOUNTER — Ambulatory Visit: Payer: Medicare Other | Admitting: Internal Medicine

## 2014-02-14 NOTE — Telephone Encounter (Signed)
Faxed script to [phone] Attn:Rene.  Did speak to Debroah Baller who will also fax over a form to check off items as well as faxing over scripts are not clear to read.  Informed Debroah Baller PCP will be out of the office until 02/20/14.  She stated ok, but will proceed with order of items today.

## 2014-02-14 NOTE — Telephone Encounter (Signed)
Faxed scripts to [phone] ATTN: Debroah Baller.  Debroah Baller will fax over check off sheet today for request as well as scripts faxed are difficult to see.  Informed her PCP is out of the office until 02/20/14 and will then sign paper work.  Debroah Baller stated that would be ok, but order can be placed for items requested today.

## 2014-02-19 DIAGNOSIS — M4800 Spinal stenosis, site unspecified: Secondary | ICD-10-CM

## 2014-02-19 DIAGNOSIS — E119 Type 2 diabetes mellitus without complications: Secondary | ICD-10-CM

## 2014-02-19 DIAGNOSIS — G8929 Other chronic pain: Secondary | ICD-10-CM

## 2014-02-21 ENCOUNTER — Ambulatory Visit (INDEPENDENT_AMBULATORY_CARE_PROVIDER_SITE_OTHER): Payer: Medicare Other | Admitting: Internal Medicine

## 2014-02-21 ENCOUNTER — Encounter: Payer: Self-pay | Admitting: Internal Medicine

## 2014-02-21 VITALS — BP 118/70 | HR 74 | Temp 98.2°F

## 2014-02-21 DIAGNOSIS — E1165 Type 2 diabetes mellitus with hyperglycemia: Principal | ICD-10-CM

## 2014-02-21 DIAGNOSIS — IMO0002 Reserved for concepts with insufficient information to code with codable children: Secondary | ICD-10-CM

## 2014-02-21 DIAGNOSIS — IMO0001 Reserved for inherently not codable concepts without codable children: Secondary | ICD-10-CM

## 2014-02-21 DIAGNOSIS — Z23 Encounter for immunization: Secondary | ICD-10-CM

## 2014-02-21 DIAGNOSIS — S82201P Unspecified fracture of shaft of right tibia, subsequent encounter for closed fracture with malunion: Secondary | ICD-10-CM

## 2014-02-21 DIAGNOSIS — I10 Essential (primary) hypertension: Secondary | ICD-10-CM

## 2014-02-21 DIAGNOSIS — E785 Hyperlipidemia, unspecified: Secondary | ICD-10-CM

## 2014-02-21 NOTE — Assessment & Plan Note (Signed)
stable overall by history and exam, recent data reviewed with pt, and pt to continue medical treatment as before,  to f/u any worsening symptoms or concerns Lab Results  Component Value Date   LDLCALC 72 07/21/2011

## 2014-02-21 NOTE — Assessment & Plan Note (Signed)
For f/u ortho as planned July 24

## 2014-02-21 NOTE — Progress Notes (Signed)
Subjective:    Patient ID: Holly Weiss, female    DOB: 1941/02/22, 73 y.o.   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  Has ortho f/u for Friday July 24, Dr Bosie Clos.  Doing ok at home, able to do most transfers with small assist.  No falls. No fever, and Denies urinary symptoms such as dysuria, frequency, urgency, flank pain, hematuria or n/v, fever, chills.  Pt denies chest pain, increased sob or doe, wheezing, orthopnea, PND, increased LE swelling, palpitations, dizziness or syncope.  Pt denies new neurological symptoms such as new headache, or facial or extremity weakness or numbness   Pt denies polydipsia, polyuria,  Past Medical History  Diagnosis Date  . ANEMIA-NOS 01/18/2008  . 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 ASTHMA, WITH ACUTE EXACERBATION 11/29/2008  . Chronic pain syndrome 01/21/2010  . DIABETIC  RETINOPATHY 05/30/2007  . DM, UNCOMPLICATED, TYPE II, UNCONTROLLED 05/30/2007  . 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 HYPERLIPIDEMIA 05/30/2007  . HYPERTENSION 10/31/2007  . LOW BACK PAIN 01/18/2008  . MORBID OBESITY, HX OF 05/30/2007  . NEPHROLITHIASIS, HX OF 01/18/2008  . SHOULDER PAIN, BILATERAL 04/09/2009  . Migraine headache   . Spinal stenosis   . Bilateral carpal tunnel syndrome   . Shortness of breath     with walking  . Urgency of urination   . Arthritis   . Edema of both legs     takes Lasix  . Numbness and tingling in hands   . GERD (gastroesophageal reflux disease)   . Femur fracture, right   . Seasonal allergies   . History of kidney stones   . Depression   . History of blood transfusion     spine surgery   Past Surgical History  Procedure Laterality Date  . Cataract extraction    . Cholecystectomy    . Thoracic fusion    . Lumbar disc surgury    . Tubal ligation    . Back surgery    .  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 Orif tibia fracture Right 01/16/2014    Procedure: RIGHT TIBIA REPAIR NON-UNION/MALUNION TIBIA WITH SLIDING GRAFT;  Surgeon: Renette Butters, MD;  Location: [facility];  Service: Orthopedics;  Laterality: Right;    reports that she has never smoked. She does not have any smokeless tobacco history on file. She reports that she does not drink alcohol or use illicit drugs. family history includes Dementia in her mother; Depression in her sister; Diabetes in her mother and sister; Heart disease in her father and mother; Lung cancer in her father. Allergies  Allergen Reactions  . Adhesive [Tape] Itching  . Endocet [Oxycodone-Acetaminophen] Itching  . Latex Itching   Current Outpatient Prescriptions on File Prior to Visit  Medication Sig Dispense Refill  . albuterol (VENTOLIN HFA) 108 (90 BASE) MCG/ACT inhaler Inhale 2 puffs into the lungs 2 (two) times daily as needed for wheezing or shortness of breath.       Marland Kitchen aspirin 81 MG chewable tablet Chew 81 mg by mouth daily.      Marland Kitchen aspirin 81 MG tablet Take 1 tablet (81  mg total) by mouth daily.  30 tablet  0  . B-D ULTRAFINE III SHORT PEN 31G X 8 MM MISC USE AS DIRECTED TWICE A DAY  200 each  3  . B-D ULTRAFINE III SHORT PEN 31G X 8 MM MISC USE AS DIRECTED TWICE A DAY  200 each  3  . Blood Glucose Monitoring Suppl ([place]) W/DEVICE KIT Use as directed once daily to check blood sugar.  Diagnosis code 250.02  1 each  0  . Calcium Carbonate-Vitamin D (CALTRATE 600+D PO) Take 2 tablets by mouth daily.      . cyclobenzaprine (FLEXERIL) 10 MG tablet Take 10 mg by mouth daily as needed for muscle spasms.       . cyclobenzaprine (FLEXERIL) 10 MG tablet TAKE 1 TABLET BY MOUTH 3 TIMES A DAY AS NEEDED FOR MUSCLE SPASMS  90  tablet  1  . donepezil (ARICEPT) 5 MG tablet Take 2.5 mg by mouth at bedtime.      . furosemide (LASIX) 40 MG tablet Take 20 mg by mouth daily.      Marland Kitchen glucose blood (ONE TOUCH TEST STRIPS) test strip Use as directed once daily to check blood sugar.  Diagnosis code 250.02  100 each  11  . insulin aspart (NOVOLOG) 100 UNIT/ML injection Inject 15 Units into the skin every evening.       . insulin glargine (LANTUS) 100 UNIT/ML injection Inject 37 Units into the skin every morning.       . Multiple Vitamins-Minerals (ONE-A-DAY EXTRAS ANTIOXIDANT PO) Take 1 tablet by mouth daily.       Marland Kitchen OLANZapine (ZYPREXA) 2.5 MG tablet Take 1 tablet (2.5 mg total) by mouth at bedtime.  30 tablet  2  . ONE TOUCH LANCETS MISC Use as directed once daily to check blood sugar.  Diagnosis code 250.02  200 each  11  . ONETOUCH DELICA LANCETS 58K MISC Use as directed once daily to check blood sugar.  Diagnosis code 250.02.  100 each  11  . oxyCODONE (ROXICODONE) 5 MG immediate release tablet Take 2 tablets (10 mg total) by mouth every 4 (four) hours as needed for severe pain.  90 tablet  0  . rosuvastatin (CRESTOR) 20 MG tablet Take 10 mg by mouth daily.       No current facility-administered medications on file prior to visit.   Review of Systems  Constitutional: Negative for unusual diaphoresis or other sweats  HENT: Negative for ringing in ear Eyes: Negative for double vision or worsening visual disturbance.  Respiratory: Negative for choking and stridor.   Gastrointestinal: Negative for vomiting or other signifcant bowel change Genitourinary: Negative for hematuria or decreased urine volume.  Musculoskeletal: Negative for other MSK pain or swelling Skin: Negative for color change and worsening wound.  Neurological: Negative for tremors and numbness other than noted  Psychiatric/Behavioral: Negative for decreased concentration or agitation other than above       Objective:   Physical Exam BP 118/70  Pulse 74   Temp(Src) 98.2 F (36.8 C) (Oral) VS noted,  Constitutional: Pt appears well-developed, well-nourished.  HENT: Head: NCAT.  Right Ear: External ear normal.  Left Ear: External ear normal.  Eyes: . Pupils are equal, round, and reactive to light. Conjunctivae and EOM are normal Neck: Normal range of motion. Neck supple.  Cardiovascular: Normal rate and regular rhythm.   Pulmonary/Chest: Effort normal and breath sounds normal.  Abd:  Soft, NT, ND, + BS Neurological: Pt is  alert. Not confused , motor grossly intact Skin: Skin is warm. No rash, no LE edema  Right leg casted above /below knee Psychiatric: Pt behavior is normal. No agitation.     Assessment & Plan:

## 2014-02-21 NOTE — Assessment & Plan Note (Addendum)
stable overall by history and exam, recent data reviewed with pt, and pt to continue medical treatment as before,  to f/u any worsening symptoms or concerns Lab Results  Component Value Date   HGBA1C 6.9* 07/12/2013   For lab eval

## 2014-02-21 NOTE — Progress Notes (Signed)
Pre visit review using our clinic review tool, if applicable. No additional management support is needed unless otherwise documented below in the visit note. 

## 2014-02-21 NOTE — Patient Instructions (Addendum)
You had the new Prevnar pneumonia shot today  Please continue all other medications as before, and refills have been done if requested.  Please have the pharmacy call with any other refills you may need.  Please continue your efforts at being more active, low cholesterol diet, and weight control.  You are otherwise up to date with prevention measures today.  Please keep your appointments with your specialists as you may have planned - orthopedic on friday  Please go to the LAB in the Basement (turn left off the elevator) for the tests to be done on Friday as you requested later this week  You will be contacted by phone if any changes need to be made immediately.  Otherwise, you will receive a letter about your results with an explanation, but please check with MyChart first.  Please remember to sign up for MyChart if you have not done so, as this will be important to you in the future with finding out test results, communicating by private email, and scheduling acute appointments online when needed.  Please return in 6 months, or sooner if needed  Please call for hand surgury referral if you change your mind about the 2 trigger fingers on the right hand

## 2014-02-21 NOTE — Assessment & Plan Note (Signed)
stable overall by history and exam, recent data reviewed with pt, and pt to continue medical treatment as before,  to f/u any worsening symptoms or concerns BP Readings from Last 3 Encounters:  02/21/14 118/70  01/17/14 107/43  01/17/14 107/43

## 2014-02-23 ENCOUNTER — Other Ambulatory Visit (INDEPENDENT_AMBULATORY_CARE_PROVIDER_SITE_OTHER): Payer: Medicare Other

## 2014-02-23 DIAGNOSIS — IMO0001 Reserved for inherently not codable concepts without codable children: Secondary | ICD-10-CM

## 2014-02-23 DIAGNOSIS — E1165 Type 2 diabetes mellitus with hyperglycemia: Principal | ICD-10-CM

## 2014-02-23 LAB — HEMOGLOBIN A1C: HEMOGLOBIN A1C: 8 % — AB (ref 4.6–6.5)

## 2014-02-23 LAB — CBC WITH DIFFERENTIAL/PLATELET
Basophils Absolute: 0 10*3/uL (ref 0.0–0.1)
Basophils Relative: 0.4 % (ref 0.0–3.0)
EOS ABS: 0.5 10*3/uL (ref 0.0–0.7)
Eosinophils Relative: 10 % — ABNORMAL HIGH (ref 0.0–5.0)
HEMATOCRIT: 29.9 % — AB (ref 36.0–46.0)
HEMOGLOBIN: 10 g/dL — AB (ref 12.0–15.0)
LYMPHS ABS: 1.9 10*3/uL (ref 0.7–4.0)
Lymphocytes Relative: 35.5 % (ref 12.0–46.0)
MCHC: 33.3 g/dL (ref 30.0–36.0)
MCV: 88.9 fl (ref 78.0–100.0)
MONOS PCT: 10.5 % (ref 3.0–12.0)
Monocytes Absolute: 0.6 10*3/uL (ref 0.1–1.0)
Neutro Abs: 2.3 10*3/uL (ref 1.4–7.7)
Neutrophils Relative %: 43.6 % (ref 43.0–77.0)
PLATELETS: 169 10*3/uL (ref 150.0–400.0)
RBC: 3.36 Mil/uL — ABNORMAL LOW (ref 3.87–5.11)
RDW: 15.6 % — AB (ref 11.5–15.5)
WBC: 5.2 10*3/uL (ref 4.0–10.5)

## 2014-02-23 LAB — HEPATIC FUNCTION PANEL
ALK PHOS: 79 U/L (ref 39–117)
ALT: 17 U/L (ref 0–35)
AST: 20 U/L (ref 0–37)
Albumin: 3.5 g/dL (ref 3.5–5.2)
BILIRUBIN TOTAL: 0.4 mg/dL (ref 0.2–1.2)
Bilirubin, Direct: 0.1 mg/dL (ref 0.0–0.3)
Total Protein: 7.1 g/dL (ref 6.0–8.3)

## 2014-02-23 LAB — BASIC METABOLIC PANEL
BUN: 36 mg/dL — ABNORMAL HIGH (ref 6–23)
CO2: 30 mEq/L (ref 19–32)
Calcium: 10.1 mg/dL (ref 8.4–10.5)
Chloride: 104 mEq/L (ref 96–112)
Creatinine, Ser: 1 mg/dL (ref 0.4–1.2)
GFR: 69.86 mL/min (ref >60.00)
GLUCOSE: 142 mg/dL — AB (ref 70–99)
POTASSIUM: 4.5 meq/L (ref 3.5–5.1)
SODIUM: 138 meq/L (ref 135–145)

## 2014-02-23 LAB — LIPID PANEL
CHOLESTEROL: 138 mg/dL (ref 0–200)
HDL: 54.6 mg/dL (ref >39.00)
LDL CALC: 72 mg/dL (ref 0–99)
NonHDL: 83.4
Total CHOL/HDL Ratio: 3
Triglycerides: 56 mg/dL (ref 0.0–149.0)
VLDL: 11.2 mg/dL (ref 0.0–40.0)

## 2014-02-23 LAB — TSH: TSH: 2.79 u[IU]/mL (ref 0.35–4.50)

## 2014-02-27 ENCOUNTER — Encounter: Payer: Self-pay | Admitting: Internal Medicine

## 2014-02-28 ENCOUNTER — Other Ambulatory Visit: Payer: Self-pay

## 2014-03-22 ENCOUNTER — Ambulatory Visit (HOSPITAL_COMMUNITY): Payer: Self-pay | Admitting: Psychiatry

## 2014-04-20 ENCOUNTER — Telehealth: Payer: Self-pay

## 2014-04-20 NOTE — Telephone Encounter (Signed)
Received PA  For the patients Novolog from 04/18/14 through 04/19/15

## 2014-05-18 ENCOUNTER — Telehealth: Payer: Self-pay | Admitting: Internal Medicine

## 2014-05-18 NOTE — Telephone Encounter (Signed)
Needing notes sent to [facility] fax [phone] Attn: Hassan Rowan, indicating the need for another wheelchair.  Medical companying already has order.

## 2014-05-18 NOTE — Telephone Encounter (Signed)
Not sure how to address this, since this was not mentioned at her last visit; ok to send last note but may not be enough for insurance to pay

## 2014-06-04 ENCOUNTER — Encounter: Payer: Self-pay | Admitting: Internal Medicine

## 2014-06-05 DIAGNOSIS — Z96651 Presence of right artificial knee joint: Secondary | ICD-10-CM | POA: Insufficient documentation

## 2014-06-12 ENCOUNTER — Other Ambulatory Visit: Payer: Self-pay | Admitting: Nurse Practitioner

## 2014-06-12 DIAGNOSIS — M810 Age-related osteoporosis without current pathological fracture: Secondary | ICD-10-CM

## 2014-06-15 ENCOUNTER — Ambulatory Visit
Admission: RE | Admit: 2014-06-15 | Discharge: 2014-06-15 | Disposition: A | Discharge status: Home / Self Care | Payer: Medicare Other | Source: Ambulatory Visit | Attending: Nurse Practitioner | Admitting: Nurse Practitioner

## 2014-06-15 DIAGNOSIS — M810 Age-related osteoporosis without current pathological fracture: Secondary | ICD-10-CM

## 2014-06-22 ENCOUNTER — Other Ambulatory Visit: Payer: Self-pay | Admitting: Internal Medicine

## 2014-06-22 DIAGNOSIS — R600 Localized edema: Secondary | ICD-10-CM | POA: Insufficient documentation

## 2014-06-25 ENCOUNTER — Ambulatory Visit: Payer: Self-pay | Admitting: Family

## 2014-06-26 ENCOUNTER — Telehealth: Payer: Self-pay | Admitting: Internal Medicine

## 2014-06-26 NOTE — Telephone Encounter (Signed)
Review 05/18/14 telephone note.

## 2014-06-26 NOTE — Telephone Encounter (Signed)
Still not sure how to address this, as apparently the order has been done.  And I am not sure of the details of the accompanying other paperwork.  Not sure how to proceed, unless this can be addressed by Home Health - I can refer if needed

## 2014-06-26 NOTE — Telephone Encounter (Signed)
Patient called requesting a wheelchair. This request was made in October and patient has not received it yet.

## 2014-06-27 NOTE — Telephone Encounter (Signed)
I spoke w/Krystal @ [facility] in [place]. The patient already received the wheelchair, they are going to pick it up today per pt request. They state they do not need anything from [place].

## 2014-07-02 ENCOUNTER — Telehealth: Payer: Self-pay | Admitting: Internal Medicine

## 2014-07-02 DIAGNOSIS — F03918 Unspecified dementia, unspecified severity, with other behavioral disturbance: Secondary | ICD-10-CM

## 2014-07-02 DIAGNOSIS — I5022 Chronic systolic (congestive) heart failure: Secondary | ICD-10-CM

## 2014-07-02 DIAGNOSIS — F0391 Unspecified dementia with behavioral disturbance: Secondary | ICD-10-CM

## 2014-07-02 NOTE — Telephone Encounter (Signed)
Patient states [facility] took wheelchair back b.c they were charging her monthly for the chair and it was leaving black marks on the floor.  She currently does not have a wheelchair.  She is requesting a script to be sent to her.

## 2014-07-02 NOTE — Telephone Encounter (Signed)
We do not do DME orders. Forwarding to Dr. Gwynn Burly CMA.

## 2014-07-03 NOTE — Telephone Encounter (Signed)
Patient informed of MD instructions.  She does want a referral for home health.

## 2014-07-03 NOTE — Telephone Encounter (Signed)
Same answer as before the family reversed the request recently for the wheelchair  Please let me know if pt needs referred to [facility] and/or HHPT as they usually are very good at determing what kind of Wheelchair is needed, as it would be difficult for me to determine this

## 2014-07-04 NOTE — Telephone Encounter (Signed)
Please refer to home health.

## 2014-07-04 NOTE — Telephone Encounter (Signed)
Already done dec 1

## 2014-07-19 DIAGNOSIS — Z9889 Other specified postprocedural states: Secondary | ICD-10-CM | POA: Insufficient documentation

## 2014-07-19 DIAGNOSIS — S82401K Unspecified fracture of shaft of right fibula, subsequent encounter for closed fracture with nonunion: Secondary | ICD-10-CM | POA: Insufficient documentation

## 2014-07-19 DIAGNOSIS — S82201K Unspecified fracture of shaft of right tibia, subsequent encounter for closed fracture with nonunion: Secondary | ICD-10-CM | POA: Insufficient documentation

## 2014-07-20 ENCOUNTER — Other Ambulatory Visit: Payer: Self-pay | Admitting: Internal Medicine

## 2014-07-20 ENCOUNTER — Ambulatory Visit (INDEPENDENT_AMBULATORY_CARE_PROVIDER_SITE_OTHER): Payer: Medicare Other | Admitting: Internal Medicine

## 2014-07-20 ENCOUNTER — Other Ambulatory Visit (INDEPENDENT_AMBULATORY_CARE_PROVIDER_SITE_OTHER): Payer: Medicare Other

## 2014-07-20 ENCOUNTER — Encounter: Payer: Self-pay | Admitting: Internal Medicine

## 2014-07-20 VITALS — BP 118/82 | HR 73 | Temp 98.3°F | Ht 61.0 in | Wt 200.0 lb

## 2014-07-20 DIAGNOSIS — G249 Dystonia, unspecified: Secondary | ICD-10-CM

## 2014-07-20 DIAGNOSIS — E119 Type 2 diabetes mellitus without complications: Secondary | ICD-10-CM

## 2014-07-20 DIAGNOSIS — Z0000 Encounter for general adult medical examination without abnormal findings: Secondary | ICD-10-CM

## 2014-07-20 DIAGNOSIS — Z23 Encounter for immunization: Secondary | ICD-10-CM

## 2014-07-20 LAB — CBC WITH DIFFERENTIAL/PLATELET
Basophils Absolute: 0 10*3/uL (ref 0.0–0.1)
Basophils Relative: 0.9 % (ref 0.0–3.0)
Eosinophils Absolute: 0.5 10*3/uL (ref 0.0–0.7)
Eosinophils Relative: 8.9 % — ABNORMAL HIGH (ref 0.0–5.0)
HCT: 27.9 % — ABNORMAL LOW (ref 36.0–46.0)
HEMOGLOBIN: 9.1 g/dL — AB (ref 12.0–15.0)
LYMPHS PCT: 22.7 % (ref 12.0–46.0)
Lymphs Abs: 1.2 10*3/uL (ref 0.7–4.0)
MCHC: 32.6 g/dL (ref 30.0–36.0)
MCV: 90.6 fl (ref 78.0–100.0)
Monocytes Absolute: 0.5 10*3/uL (ref 0.1–1.0)
Monocytes Relative: 9.4 % (ref 3.0–12.0)
NEUTROS PCT: 58.1 % (ref 43.0–77.0)
Neutro Abs: 3.1 10*3/uL (ref 1.4–7.7)
Platelets: 344 10*3/uL (ref 150.0–400.0)
RBC: 3.08 Mil/uL — AB (ref 3.87–5.11)
RDW: 15.5 % (ref 11.5–15.5)
WBC: 5.3 10*3/uL (ref 4.0–10.5)

## 2014-07-20 LAB — BASIC METABOLIC PANEL
BUN: 28 mg/dL — AB (ref 6–23)
CALCIUM: 10 mg/dL (ref 8.4–10.5)
CHLORIDE: 105 meq/L (ref 96–112)
CO2: 26 mEq/L (ref 19–32)
Creatinine, Ser: 1.1 mg/dL (ref 0.4–1.2)
GFR: 63.17 mL/min (ref >60.00)
Glucose, Bld: 87 mg/dL (ref 70–99)
Potassium: 4.7 mEq/L (ref 3.5–5.1)
Sodium: 138 mEq/L (ref 135–145)

## 2014-07-20 LAB — URINALYSIS, ROUTINE W REFLEX MICROSCOPIC
Bilirubin Urine: NEGATIVE
Hgb urine dipstick: NEGATIVE
KETONES UR: NEGATIVE
Nitrite: NEGATIVE
SPECIFIC GRAVITY, URINE: 1.02 (ref 1.000–1.030)
Total Protein, Urine: NEGATIVE
URINE GLUCOSE: NEGATIVE
UROBILINOGEN UA: 0.2 (ref 0.0–1.0)
pH: 7 (ref 5.0–8.0)

## 2014-07-20 LAB — LIPID PANEL
CHOL/HDL RATIO: 4
Cholesterol: 146 mg/dL (ref 0–200)
HDL: 38.8 mg/dL — ABNORMAL LOW (ref >39.00)
LDL Cholesterol: 88 mg/dL (ref 0–99)
NonHDL: 107.2
TRIGLYCERIDES: 95 mg/dL (ref 0.0–149.0)
VLDL: 19 mg/dL (ref 0.0–40.0)

## 2014-07-20 LAB — HEPATIC FUNCTION PANEL
ALT: 18 U/L (ref 0–35)
AST: 22 U/L (ref 0–37)
Albumin: 3.5 g/dL (ref 3.5–5.2)
Alkaline Phosphatase: 83 U/L (ref 39–117)
Bilirubin, Direct: 0.1 mg/dL (ref 0.0–0.3)
TOTAL PROTEIN: 7.5 g/dL (ref 6.0–8.3)
Total Bilirubin: 0.7 mg/dL (ref 0.2–1.2)

## 2014-07-20 LAB — HEMOGLOBIN A1C: Hgb A1c MFr Bld: 7 % — ABNORMAL HIGH (ref 4.6–6.5)

## 2014-07-20 LAB — MICROALBUMIN / CREATININE URINE RATIO
Creatinine,U: 131.5 mg/dL
MICROALB/CREAT RATIO: 1.3 mg/g (ref 0.0–30.0)
Microalb, Ur: 1.7 mg/dL (ref 0.0–1.9)

## 2014-07-20 MED ORDER — ALBUTEROL SULFATE HFA 108 (90 BASE) MCG/ACT IN AERS: 2.0000 | INHALATION_SPRAY | Freq: Two times a day (BID) | RESPIRATORY_TRACT | Status: DC | PRN

## 2014-07-20 NOTE — Progress Notes (Signed)
Subjective:    Patient ID: Holly Weiss, female    DOB: March 07, 1941, 73 y.o.   MRN: [id number]  HPI Here for wellness and f/u;  Overall doing ok;  Pt denies CP, worsening SOB, DOE, wheezing, orthopnea, PND, worsening LE edema, palpitations, dizziness or syncope.  Pt denies neurological change such as new headache, facial or extremity weakness.  Pt denies polydipsia, polyuria, or low sugar symptoms. Pt states overall good compliance with treatment and medications, good tolerability, and has been trying to follow lower cholesterol diet.  Pt denies worsening depressive symptoms, suicidal ideation or panic. No fever, night sweats, wt loss, loss of appetite, or other constitutional symptoms.  Pt states good ability with ADL's, has low fall risk, home safety reviewed and adequate, no other significant changes in hearing or vision.  Has > 4 mo onset distal RUE spasm/pain unusual, not improving.  Past Medical History  Diagnosis Date  . ANEMIA-NOS 01/18/2008  . 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 ASTHMA, WITH ACUTE EXACERBATION 11/29/2008  . Chronic pain syndrome 01/21/2010  . DIABETIC  RETINOPATHY 05/30/2007  . DM, UNCOMPLICATED, TYPE II, UNCONTROLLED 05/30/2007  . 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 HYPERLIPIDEMIA 05/30/2007  . HYPERTENSION 10/31/2007  . LOW BACK PAIN 01/18/2008  . MORBID OBESITY, HX OF 05/30/2007  . NEPHROLITHIASIS, HX OF 01/18/2008  . SHOULDER PAIN, BILATERAL 04/09/2009  . Migraine headache   . Spinal stenosis   . Bilateral carpal tunnel syndrome   . Shortness of breath     with walking  . Urgency of urination   . Arthritis   . Edema of both legs     takes Lasix  . Numbness and tingling in hands   . GERD (gastroesophageal reflux disease)   . Femur fracture, right   . Seasonal allergies   . History of kidney stones   . Depression   . History of blood transfusion     spine surgery   Past Surgical History  Procedure Laterality Date  . Cataract extraction    . Cholecystectomy    .  Thoracic fusion    . Lumbar disc surgury    . Tubal ligation    . Back surgery    . Total knee arthroplasty with revision components Right 07/12/2013    Procedure: TOTAL KNEE ARTHROPLASTY WITH TIBIA REVISION COMPONENTS;  Surgeon: Ninetta Lights, MD;  Location: [place];  Service: Orthopedics;  Laterality: Right;  . Orif tibia fracture Right 01/16/2014    Procedure: RIGHT TIBIA REPAIR NON-UNION/MALUNION TIBIA WITH SLIDING GRAFT;  Surgeon: Renette Butters, MD;  Location: [place];  Service: Orthopedics;  Laterality: Right;    reports that she has never smoked. She does not have any smokeless tobacco history on file. She reports that she does not drink alcohol or use illicit drugs. family history includes Dementia in her mother; Depression in her sister; Diabetes in her mother and sister; Heart disease in her father and mother; Lung cancer in her father. Allergies  Allergen Reactions  . Adhesive [Tape] Itching  . Endocet [Oxycodone-Acetaminophen] Itching  . Latex Itching   Current Outpatient Prescriptions on File Prior to Visit  Medication Sig Dispense Refill  . aspirin 81 MG tablet Take 1 tablet (81 mg total) by mouth daily. 30 tablet 0  . B-D ULTRAFINE III SHORT PEN 31G X 8 MM MISC USE AS DIRECTED TWICE A DAY 200 each 3  . B-D ULTRAFINE III SHORT PEN 31G X 8 MM MISC USE AS DIRECTED TWICE A DAY 200  each 3  . Blood Glucose Monitoring Suppl ([place]) W/DEVICE KIT Use as directed once daily to check blood sugar.  Diagnosis code 250.02 1 each 0  . Calcium Carbonate-Vitamin D (CALTRATE 600+D PO) Take 2 tablets by mouth daily.    . cyclobenzaprine (FLEXERIL) 10 MG tablet Take 10 mg by mouth daily as needed for muscle spasms.     . furosemide (LASIX) 40 MG tablet Take 20 mg by mouth daily.    Marland Kitchen glucose blood (ONE TOUCH TEST STRIPS) test strip Use as directed once daily to check blood sugar.  Diagnosis code 250.02 100 each 11  . insulin aspart (NOVOLOG) 100 UNIT/ML injection Inject 15  Units into the skin every evening.     . insulin glargine (LANTUS) 100 UNIT/ML injection Inject 40 Units into the skin every morning.     . Multiple Vitamins-Minerals (ONE-A-DAY EXTRAS ANTIOXIDANT PO) Take 1 tablet by mouth daily.     Marland Kitchen OLANZapine (ZYPREXA) 2.5 MG tablet Take 1 tablet (2.5 mg total) by mouth at bedtime. 30 tablet 2  . ONE TOUCH LANCETS MISC Use as directed once daily to check blood sugar.  Diagnosis code 250.02 200 each 11  . ONETOUCH DELICA LANCETS 97X MISC Use as directed once daily to check blood sugar.  Diagnosis code 250.02. 100 each 11  . oxyCODONE (ROXICODONE) 5 MG immediate release tablet Take 2 tablets (10 mg total) by mouth every 4 (four) hours as needed for severe pain. 90 tablet 0  . rosuvastatin (CRESTOR) 20 MG tablet Take 10 mg by mouth daily.     No current facility-administered medications on file prior to visit.   Review of Systems Constitutional: Negative for increased diaphoresis, other activity, appetite or other siginficant weight change  HENT: Negative for worsening hearing loss, ear pain, facial swelling, mouth sores and neck stiffness.   Eyes: Negative for other worsening pain, redness or visual disturbance.  Respiratory: Negative for shortness of breath and wheezing.   Cardiovascular: Negative for chest pain and palpitations.  Gastrointestinal: Negative for diarrhea, blood in stool, abdominal distention or other pain Genitourinary: Negative for hematuria, flank pain or change in urine volume.  Musculoskeletal: Negative for myalgias or other joint complaints.  Skin: Negative for color change and wound.  Neurological: Negative for syncope and numbness. other than noted Hematological: Negative for adenopathy. or other swelling Psychiatric/Behavioral: Negative for hallucinations, self-injury, decreased concentration or other worsening agitation.      Objective:   Physical Exam BP 118/82 mmHg  Pulse 73  Temp(Src) 98.3 F (36.8 C) (Oral)  Ht 5' 1"  (1.549 m)  Wt 200 lb (90.719 kg)  BMI 37.81 kg/m2  SpO2 99% VS noted, examined in wheelchair Constitutional: Pt is oriented to person. Appears well-developed and well-nourished. Annabell Sabal Head: Normocephalic and atraumatic.  Right Ear: External ear normal.  Left Ear: External ear normal.  Nose: Nose normal.  Mouth/Throat: Oropharynx is clear and moist.  Eyes: Conjunctivae and EOM are normal. Pupils are equal, round, and reactive to light.  Neck: Normal range of motion. Neck supple. No JVD present. No tracheal deviation present.  Cardiovascular: Normal rate, regular rhythm, normal heart sounds and intact distal pulses.   Pulmonary/Chest: Effort normal and breath sounds without rales or wheezing  Abdominal: Soft. Bowel sounds are normal. NT. No HSM  Musculoskeletal: Normal range of motion. Exhibits no edema.  Lymphadenopathy:  Has no cervical adenopathy.  Neurological: Pt is alert and oriented to person, place, and time.  No cranial nerve  deficit. Motor grossly intact, dystonic distal RUE noted Skin: Skin is warm and dry. No rash noted. Steristrips noted to right ant leg Psychiatric:  Has normal mood and affect. Behavior is normal.     Assessment & Plan:

## 2014-07-20 NOTE — Patient Instructions (Addendum)
You had the flu shot today  Please continue all other medications as before, and refills have been done if requested.  Please have the pharmacy call with any other refills you may need.  Please continue your efforts at being more active, low cholesterol diet, and weight control.  You are otherwise up to date with prevention measures today.  Please keep your appointments with your specialists as you may have planned  You will be contacted regarding the referral for: Neurology - Dr Tat  Please go to the LAB in the Basement (turn left off the elevator) for the tests to be done today  You will be contacted by phone if any changes need to be made immediately.  Otherwise, you will receive a letter about your results with an explanation, but please check with MyChart first.  Please remember to sign up for MyChart if you have not done so, as this will be important to you in the future with finding out test results, communicating by private email, and scheduling acute appointments online when needed.  Please return in 6 months, or sooner if needed

## 2014-07-20 NOTE — Progress Notes (Signed)
Pre visit review using our clinic review tool, if applicable. No additional management support is needed unless otherwise documented below in the visit note. 

## 2014-07-21 NOTE — Assessment & Plan Note (Signed)

## 2014-07-21 NOTE — Assessment & Plan Note (Signed)
stable overall by history and exam, recent data reviewed with pt, and pt to continue medical treatment as before,  to f/u any worsening symptoms or concerns Lab Results  Component Value Date   HGBA1C 8.0* 02/23/2014

## 2014-07-21 NOTE — Assessment & Plan Note (Signed)
For neurology referral

## 2014-07-24 ENCOUNTER — Encounter: Payer: Self-pay | Admitting: Internal Medicine

## 2014-07-24 ENCOUNTER — Telehealth: Payer: Self-pay | Admitting: Internal Medicine

## 2014-07-24 LAB — TSH: TSH: 1.744 u[IU]/mL (ref 0.350–4.500)

## 2014-07-24 NOTE — Telephone Encounter (Signed)
Requesting script for a metal roll chair Fax number [phone] Needs diagnosis on script

## 2014-07-25 NOTE — Telephone Encounter (Signed)
Done hardcopy to robin  

## 2014-07-25 NOTE — Telephone Encounter (Signed)
Faxed script to number below.

## 2014-07-31 NOTE — Telephone Encounter (Signed)
Wheel chair script faxed as requested.

## 2014-07-31 NOTE — Telephone Encounter (Signed)
Redo please and needs to be written as Standard Wheel Chair (same codes as previous). Arville Go called stating insurance will not pay based on previous script for Metal Roll Chair

## 2014-07-31 NOTE — Telephone Encounter (Signed)
Done hardcopy to robin  

## 2014-08-01 ENCOUNTER — Other Ambulatory Visit: Payer: Self-pay | Admitting: *Deleted

## 2014-08-01 ENCOUNTER — Ambulatory Visit (INDEPENDENT_AMBULATORY_CARE_PROVIDER_SITE_OTHER): Payer: Medicare Other | Admitting: Neurology

## 2014-08-01 ENCOUNTER — Encounter: Payer: Self-pay | Admitting: Neurology

## 2014-08-01 VITALS — BP 128/62 | HR 88 | Ht 61.0 in | Wt 210.0 lb

## 2014-08-01 DIAGNOSIS — G2402 Drug induced acute dystonia: Secondary | ICD-10-CM

## 2014-08-01 DIAGNOSIS — E1342 Other specified diabetes mellitus with diabetic polyneuropathy: Secondary | ICD-10-CM

## 2014-08-01 DIAGNOSIS — T43505A Adverse effect of unspecified antipsychotics and neuroleptics, initial encounter: Secondary | ICD-10-CM

## 2014-08-01 DIAGNOSIS — G2401 Drug induced subacute dyskinesia: Secondary | ICD-10-CM

## 2014-08-01 DIAGNOSIS — G609 Hereditary and idiopathic neuropathy, unspecified: Secondary | ICD-10-CM

## 2014-08-01 DIAGNOSIS — G5621 Lesion of ulnar nerve, right upper limb: Secondary | ICD-10-CM

## 2014-08-01 DIAGNOSIS — G629 Polyneuropathy, unspecified: Secondary | ICD-10-CM

## 2014-08-01 DIAGNOSIS — E1142 Type 2 diabetes mellitus with diabetic polyneuropathy: Secondary | ICD-10-CM

## 2014-08-01 NOTE — Progress Notes (Signed)
Holly Weiss was seen today in neurologic consultation at the request of Cathlean Cower, MD.  The consultation is for the evaluation of dystonia.  Pt is accompanied by her daughter who suppplements the history, but minimally so.  Pt is not good historian.  Pt was in behavioral health hospital about a year ago at [facility] and she states that she was given some medication that caused her to go to sleep and when she woke up, her fingers "were folded up" and have been that way ever since.  She states that it was a pill she was given but doesn't know what it was (as opposed to an injection).  It doesn't hurt at all.  She states that the thumb and the pinky are good but the others seem to curl all the time.  It doesn't change position as the day goes on.  It does not involuntarily move.    ALLERGIES:   Allergies  Allergen Reactions  . Adhesive [Tape] Itching  . Endocet [Oxycodone-Acetaminophen] Itching  . Latex Itching    CURRENT MEDICATIONS:  Outpatient Encounter Prescriptions as of 08/01/2014  Medication Sig  . albuterol (VENTOLIN HFA) 108 (90 BASE) MCG/ACT inhaler Inhale 2 puffs into the lungs 2 (two) times daily as needed for wheezing or shortness of breath.  Marland Kitchen aspirin 81 MG tablet Take 1 tablet (81 mg total) by mouth daily.  . Calcium Carbonate-Vitamin D (CALTRATE 600+D PO) Take 2 tablets by mouth daily.  . cyclobenzaprine (FLEXERIL) 10 MG tablet Take 10 mg by mouth daily as needed for muscle spasms.   Marland Kitchen donepezil (ARICEPT) 5 MG tablet Take 2.5 mg by mouth at bedtime.  . furosemide (LASIX) 40 MG tablet Take 20 mg by mouth daily.  Marland Kitchen HYDROcodone-acetaminophen (NORCO/VICODIN) 5-325 MG per tablet Take 1 tablet by mouth every 4 (four) hours as needed for moderate pain.  Marland Kitchen insulin aspart (NOVOLOG) 100 UNIT/ML injection Inject 15 Units into the skin every evening.   . insulin glargine (LANTUS) 100 UNIT/ML injection Inject 40 Units into the skin every morning.   . Magnesium 250 MG TABS Take 250 mg by  mouth daily.  . Multiple Vitamins-Minerals (ONE-A-DAY EXTRAS ANTIOXIDANT PO) Take 1 tablet by mouth daily.   Marland Kitchen OLANZapine (ZYPREXA) 2.5 MG tablet Take 1 tablet (2.5 mg total) by mouth at bedtime.  Marland Kitchen oxyCODONE (ROXICODONE) 5 MG immediate release tablet Take 2 tablets (10 mg total) by mouth every 4 (four) hours as needed for severe pain.  . rosuvastatin (CRESTOR) 20 MG tablet Take 10 mg by mouth daily.  . B-D ULTRAFINE III SHORT PEN 31G X 8 MM MISC USE AS DIRECTED TWICE A DAY (Patient not taking: Reported on 08/01/2014)  . B-D ULTRAFINE III SHORT PEN 31G X 8 MM MISC USE AS DIRECTED TWICE A DAY (Patient not taking: Reported on 08/01/2014)  . Blood Glucose Monitoring Suppl (ONE TOUCH BASIC SYSTEM) W/DEVICE KIT Use as directed once daily to check blood sugar.  Diagnosis code 250.02 (Patient not taking: Reported on 08/01/2014)  . glucose blood (ONE TOUCH TEST STRIPS) test strip Use as directed once daily to check blood sugar.  Diagnosis code 250.02 (Patient not taking: Reported on 08/01/2014)  . ONE TOUCH LANCETS MISC Use as directed once daily to check blood sugar.  Diagnosis code 250.02 (Patient not taking: Reported on 08/01/2014)  . ONETOUCH DELICA LANCETS 62U MISC Use as directed once daily to check blood sugar.  Diagnosis code 250.02. (Patient not taking: Reported on 08/01/2014)  PAST MEDICAL HISTORY:   Past Medical History  Diagnosis Date  . ANEMIA-NOS 01/18/2008  . 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 ASTHMA, WITH ACUTE EXACERBATION 11/29/2008  . Chronic pain syndrome 01/21/2010  . DIABETIC  RETINOPATHY 05/30/2007  . DM, UNCOMPLICATED, TYPE II, UNCONTROLLED 05/30/2007  . 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 HYPERLIPIDEMIA 05/30/2007  . HYPERTENSION 10/31/2007  . LOW BACK PAIN 01/18/2008  . MORBID OBESITY, HX OF 05/30/2007  . NEPHROLITHIASIS, HX OF 01/18/2008  . SHOULDER PAIN, BILATERAL 04/09/2009  . Migraine headache   . Spinal stenosis   . Bilateral carpal tunnel syndrome   . Shortness of breath      with walking  . Urgency of urination   . Arthritis   . Edema of both legs     takes Lasix  . Numbness and tingling in hands   . GERD (gastroesophageal reflux disease)   . Femur fracture, right   . Seasonal allergies   . History of kidney stones   . Depression   . History of blood transfusion     spine surgery    PAST SURGICAL HISTORY:   Past Surgical History  Procedure Laterality Date  . Cataract extraction    . Cholecystectomy    . Thoracic fusion    . Lumbar disc surgury    . Tubal ligation    . Back surgery    . Total knee arthroplasty with revision components Right 07/12/2013    Procedure: TOTAL KNEE ARTHROPLASTY WITH TIBIA REVISION COMPONENTS;  Surgeon: Ninetta Lights, MD;  Location: [place];  Service: Orthopedics;  Laterality: Right;  . Orif tibia fracture Right 01/16/2014    Procedure: RIGHT TIBIA REPAIR NON-UNION/MALUNION TIBIA WITH SLIDING GRAFT;  Surgeon: Renette Butters, MD;  Location: [place];  Service: Orthopedics;  Laterality: Right;    SOCIAL HISTORY:   History   Social History  . Marital Status: Widowed    Spouse Name: N/A    Number of Children: 63  . Years of Education: N/A   Occupational History  . 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 Smoking status: Never Smoker   . Smokeless tobacco: Not on file  . Alcohol Use: No  . Drug Use: No  . Sexual Activity: No   Other Topics Concern  . Not on file   Social History Narrative   She is married to her second husband for about 24 yrs. He has MS and is in a wheelchair - died 09-25-09. She has five grown children from her first marriage.    FAMILY HISTORY:   Family Status  Relation Status Death Age  . Mother Deceased 71    DM, heart disease  . Father Deceased 80    secondary to lung cancer  . Sister Alive   . Other      multiple siblings, type 2 diabetes  . Brother Alive   . Sister Alive   . Sister Alive   . Son Alive   . Son Alive     . Daughter Alive   . Daughter Alive   . Daughter Alive     ROS:  A complete 10 system review of systems was obtained and was unremarkable apart from what is mentioned above.  PHYSICAL EXAMINATION:    VITALS:   Filed Vitals:   08/01/14 1022  BP: 128/62  Pulse: 88  Height: 5' 1"  (1.549 m)  Weight: 210 lb (95.255 kg)  GEN:  Normal appears female in no acute distress.  Appears stated age. HEENT:  Normocephalic, atraumatic. The mucous membranes are moist. The superficial temporal arteries are without ropiness or tenderness. Cardiovascular: Regular rate and rhythm. Lungs: Clear to auscultation bilaterally. Neck/Heme: There are no carotid bruits noted bilaterally. Skin:  Steri-strips over wound on R leg.  NEUROLOGICAL: Orientation:  Complete MMSE done on 08/01/14 and pt scored 24/30 Cranial nerves: There is good facial symmetry. The pupils are equal round and reactive to light bilaterally.  Fundoscopic exam is attempted but the disc margins are not well visualized bilaterally.. Extraocular muscles are intact and visual fields are full to confrontational testing. Speech is fluent and clear. Soft palate rises symmetrically and there is no tongue deviation. Hearing is intact to conversational tone. Tone: Tone is good throughout. Sensation: Sensation is intact to light touch and pinprick throughout (facial, trunk, extremities). Vibration is absent at the bilateral big toe and ankle. There is no extinction with double simultaneous stimulation. There is no sensory dermatomal level identified. Coordination:  The patient has no difficulty with RAM's or FNF bilaterally. Motor: Weakness in the ulnar innervated muscles on the right.  Wasting of the APB bilaterally.  Weakness of the right leg (4/5) but attributes to recent surgeries. DTR's: Deep tendon reflexes are 0/4 at the bilateral biceps, triceps, brachioradialis, patella and achilles.  Plantar responses are downgoing bilaterally. Gait:  Pt  states that she is unable to ambulate.  She had surgery 4 weeks ago and has been non-ambulatory since that time.  Gait was even limited prior to that as states has had 3 surgeries on the knee over the year Abnormal movements:  Slight orobuccolingual dyskinesia   IMPRESSION/PLAN  1. 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  Diabetic peripheral neuropathy  -evidence in hands and feet  -safety discussed 3.  Mild orobuccolingual dyskinesia  -likely due to zyprexa.  Is very mild currently but exposure will increase risk that this worsens.  Discussed.  Should d/w prescribing physician.

## 2014-08-07 DIAGNOSIS — S82201K Unspecified fracture of shaft of right tibia, subsequent encounter for closed fracture with nonunion: Secondary | ICD-10-CM | POA: Diagnosis not present

## 2014-08-07 DIAGNOSIS — S82401K Unspecified fracture of shaft of right fibula, subsequent encounter for closed fracture with nonunion: Secondary | ICD-10-CM | POA: Diagnosis not present

## 2014-08-08 ENCOUNTER — Other Ambulatory Visit: Payer: Self-pay | Admitting: Internal Medicine

## 2014-08-08 ENCOUNTER — Telehealth: Payer: Self-pay | Admitting: Internal Medicine

## 2014-08-08 DIAGNOSIS — I509 Heart failure, unspecified: Secondary | ICD-10-CM

## 2014-08-08 NOTE — Telephone Encounter (Signed)
The computer EMR is very unforgiving with terms, and instructions appear to need to be very exact  To search for an order for "ambulatory referral for DME" there is no option or dropdown box to order a wheelchair (this a REFERRAL type order)  To search for an order for "wheelchair" there any many options, but I presume the most approp might be a SUPPLY type order - but there are several options:      High Camera operator wheelchair      Standard manual      Electric wheelchair  Please ask the [facility] representative to clarify the exact orders needed, such as whether a Referral order is needed, a Supply order is needed ( or both), and if a supply order is needed then which one

## 2014-08-08 NOTE — Telephone Encounter (Signed)
But what about clarifying the "more info" part?

## 2014-08-08 NOTE — Telephone Encounter (Signed)
Called [facility] informed of PCP questions.. [facility] Melissa Sheran Lawless is coming by the office today and will drop off a cheat sheet which hopefully will be what is needed.

## 2014-08-08 NOTE — Telephone Encounter (Signed)
Called [facility] and they are suggesting PCP do a ambulatory referral and under the DME order a light weight wheel chair.. I can then send Darlina Guys at [facility] an in basket message and she will pull the order up.

## 2014-08-08 NOTE — Telephone Encounter (Signed)
To robin to help

## 2014-08-08 NOTE — Telephone Encounter (Signed)
Received an order for a wheelchair last week, but it was too dark to be legible. She also needs more info. Please contact her so have the RX filled.

## 2014-08-08 NOTE — Telephone Encounter (Signed)
The script will need to be rewritten as I no longer have.

## 2014-08-09 NOTE — Telephone Encounter (Signed)
[facility] did drop off a cheat sheet for PCP to put in an order for this wheel. Chair.  Called [facility] left msg. Order is done and can see in EPIC.

## 2014-08-10 DIAGNOSIS — Z9181 History of falling: Secondary | ICD-10-CM | POA: Diagnosis not present

## 2014-08-10 DIAGNOSIS — D649 Anemia, unspecified: Secondary | ICD-10-CM | POA: Diagnosis not present

## 2014-08-10 DIAGNOSIS — E119 Type 2 diabetes mellitus without complications: Secondary | ICD-10-CM | POA: Diagnosis not present

## 2014-08-10 DIAGNOSIS — J45909 Unspecified asthma, uncomplicated: Secondary | ICD-10-CM | POA: Diagnosis not present

## 2014-08-10 DIAGNOSIS — Z7982 Long term (current) use of aspirin: Secondary | ICD-10-CM | POA: Diagnosis not present

## 2014-08-10 DIAGNOSIS — R32 Unspecified urinary incontinence: Secondary | ICD-10-CM | POA: Diagnosis not present

## 2014-08-10 DIAGNOSIS — S82201K Unspecified fracture of shaft of right tibia, subsequent encounter for closed fracture with nonunion: Secondary | ICD-10-CM | POA: Diagnosis not present

## 2014-08-10 DIAGNOSIS — Z794 Long term (current) use of insulin: Secondary | ICD-10-CM | POA: Diagnosis not present

## 2014-08-10 DIAGNOSIS — Z96651 Presence of right artificial knee joint: Secondary | ICD-10-CM | POA: Diagnosis not present

## 2014-08-10 DIAGNOSIS — M1990 Unspecified osteoarthritis, unspecified site: Secondary | ICD-10-CM | POA: Diagnosis not present

## 2014-08-14 DIAGNOSIS — E785 Hyperlipidemia, unspecified: Secondary | ICD-10-CM | POA: Diagnosis not present

## 2014-08-14 DIAGNOSIS — S82291G Other fracture of shaft of right tibia, subsequent encounter for closed fracture with delayed healing: Secondary | ICD-10-CM | POA: Diagnosis not present

## 2014-08-14 DIAGNOSIS — S82201K Unspecified fracture of shaft of right tibia, subsequent encounter for closed fracture with nonunion: Secondary | ICD-10-CM | POA: Diagnosis not present

## 2014-08-14 DIAGNOSIS — E119 Type 2 diabetes mellitus without complications: Secondary | ICD-10-CM | POA: Diagnosis not present

## 2014-08-14 DIAGNOSIS — Z4789 Encounter for other orthopedic aftercare: Secondary | ICD-10-CM | POA: Diagnosis not present

## 2014-08-14 DIAGNOSIS — S82401K Unspecified fracture of shaft of right fibula, subsequent encounter for closed fracture with nonunion: Secondary | ICD-10-CM | POA: Diagnosis not present

## 2014-08-15 DIAGNOSIS — R32 Unspecified urinary incontinence: Secondary | ICD-10-CM | POA: Diagnosis not present

## 2014-08-15 DIAGNOSIS — Z9181 History of falling: Secondary | ICD-10-CM | POA: Diagnosis not present

## 2014-08-15 DIAGNOSIS — S82201K Unspecified fracture of shaft of right tibia, subsequent encounter for closed fracture with nonunion: Secondary | ICD-10-CM | POA: Diagnosis not present

## 2014-08-15 DIAGNOSIS — D649 Anemia, unspecified: Secondary | ICD-10-CM | POA: Diagnosis not present

## 2014-08-15 DIAGNOSIS — J45909 Unspecified asthma, uncomplicated: Secondary | ICD-10-CM | POA: Diagnosis not present

## 2014-08-15 DIAGNOSIS — Z794 Long term (current) use of insulin: Secondary | ICD-10-CM | POA: Diagnosis not present

## 2014-08-15 DIAGNOSIS — Z7982 Long term (current) use of aspirin: Secondary | ICD-10-CM | POA: Diagnosis not present

## 2014-08-15 DIAGNOSIS — M1990 Unspecified osteoarthritis, unspecified site: Secondary | ICD-10-CM | POA: Diagnosis not present

## 2014-08-15 DIAGNOSIS — Z96651 Presence of right artificial knee joint: Secondary | ICD-10-CM | POA: Diagnosis not present

## 2014-08-15 DIAGNOSIS — E119 Type 2 diabetes mellitus without complications: Secondary | ICD-10-CM | POA: Diagnosis not present

## 2014-08-17 DIAGNOSIS — R9431 Abnormal electrocardiogram [ECG] [EKG]: Secondary | ICD-10-CM | POA: Diagnosis not present

## 2014-08-17 DIAGNOSIS — Z01810 Encounter for preprocedural cardiovascular examination: Secondary | ICD-10-CM | POA: Diagnosis not present

## 2014-08-21 DIAGNOSIS — R609 Edema, unspecified: Secondary | ICD-10-CM | POA: Diagnosis not present

## 2014-08-21 DIAGNOSIS — D649 Anemia, unspecified: Secondary | ICD-10-CM | POA: Diagnosis not present

## 2014-08-21 DIAGNOSIS — Z794 Long term (current) use of insulin: Secondary | ICD-10-CM | POA: Diagnosis not present

## 2014-08-21 DIAGNOSIS — J45909 Unspecified asthma, uncomplicated: Secondary | ICD-10-CM | POA: Diagnosis not present

## 2014-08-21 DIAGNOSIS — Z7982 Long term (current) use of aspirin: Secondary | ICD-10-CM | POA: Diagnosis not present

## 2014-08-21 DIAGNOSIS — E119 Type 2 diabetes mellitus without complications: Secondary | ICD-10-CM | POA: Diagnosis not present

## 2014-08-21 DIAGNOSIS — R079 Chest pain, unspecified: Secondary | ICD-10-CM | POA: Diagnosis not present

## 2014-08-21 DIAGNOSIS — R9431 Abnormal electrocardiogram [ECG] [EKG]: Secondary | ICD-10-CM | POA: Insufficient documentation

## 2014-08-21 DIAGNOSIS — I10 Essential (primary) hypertension: Secondary | ICD-10-CM | POA: Diagnosis not present

## 2014-08-21 DIAGNOSIS — R600 Localized edema: Secondary | ICD-10-CM | POA: Diagnosis not present

## 2014-08-21 DIAGNOSIS — E669 Obesity, unspecified: Secondary | ICD-10-CM | POA: Diagnosis not present

## 2014-08-21 DIAGNOSIS — M79604 Pain in right leg: Secondary | ICD-10-CM | POA: Diagnosis not present

## 2014-08-21 DIAGNOSIS — M1990 Unspecified osteoarthritis, unspecified site: Secondary | ICD-10-CM | POA: Diagnosis not present

## 2014-08-21 DIAGNOSIS — E785 Hyperlipidemia, unspecified: Secondary | ICD-10-CM | POA: Diagnosis not present

## 2014-08-21 DIAGNOSIS — Z993 Dependence on wheelchair: Secondary | ICD-10-CM | POA: Diagnosis not present

## 2014-08-21 DIAGNOSIS — Z01810 Encounter for preprocedural cardiovascular examination: Secondary | ICD-10-CM | POA: Diagnosis not present

## 2014-08-23 ENCOUNTER — Ambulatory Visit: Payer: Self-pay | Admitting: Internal Medicine

## 2014-08-23 DIAGNOSIS — S82201K Unspecified fracture of shaft of right tibia, subsequent encounter for closed fracture with nonunion: Secondary | ICD-10-CM | POA: Diagnosis not present

## 2014-08-23 DIAGNOSIS — S82291K Other fracture of shaft of right tibia, subsequent encounter for closed fracture with nonunion: Secondary | ICD-10-CM | POA: Diagnosis not present

## 2014-08-23 DIAGNOSIS — S82201G Unspecified fracture of shaft of right tibia, subsequent encounter for closed fracture with delayed healing: Secondary | ICD-10-CM | POA: Diagnosis not present

## 2014-08-23 DIAGNOSIS — S82401K Unspecified fracture of shaft of right fibula, subsequent encounter for closed fracture with nonunion: Secondary | ICD-10-CM | POA: Diagnosis not present

## 2014-08-23 DIAGNOSIS — G5621 Lesion of ulnar nerve, right upper limb: Secondary | ICD-10-CM | POA: Diagnosis not present

## 2014-08-29 DIAGNOSIS — I2589 Other forms of chronic ischemic heart disease: Secondary | ICD-10-CM | POA: Diagnosis not present

## 2014-08-29 DIAGNOSIS — E785 Hyperlipidemia, unspecified: Secondary | ICD-10-CM | POA: Diagnosis not present

## 2014-08-29 DIAGNOSIS — E119 Type 2 diabetes mellitus without complications: Secondary | ICD-10-CM | POA: Diagnosis not present

## 2014-08-29 DIAGNOSIS — I10 Essential (primary) hypertension: Secondary | ICD-10-CM | POA: Diagnosis not present

## 2014-08-29 DIAGNOSIS — J45909 Unspecified asthma, uncomplicated: Secondary | ICD-10-CM | POA: Diagnosis not present

## 2014-08-29 DIAGNOSIS — Z01810 Encounter for preprocedural cardiovascular examination: Secondary | ICD-10-CM | POA: Diagnosis not present

## 2014-08-29 DIAGNOSIS — R9431 Abnormal electrocardiogram [ECG] [EKG]: Secondary | ICD-10-CM | POA: Diagnosis not present

## 2014-08-29 DIAGNOSIS — R600 Localized edema: Secondary | ICD-10-CM | POA: Diagnosis not present

## 2014-08-29 DIAGNOSIS — I259 Chronic ischemic heart disease, unspecified: Secondary | ICD-10-CM | POA: Diagnosis not present

## 2014-08-31 ENCOUNTER — Telehealth: Payer: Self-pay | Admitting: Neurology

## 2014-08-31 NOTE — Telephone Encounter (Signed)
Pt called to cancel EMG study on 09/03/14. She says she will call later to r/s / Sherri S.

## 2014-09-03 ENCOUNTER — Encounter: Payer: Self-pay | Admitting: Neurology

## 2014-09-19 ENCOUNTER — Telehealth (HOSPITAL_COMMUNITY): Payer: Self-pay

## 2014-09-19 DIAGNOSIS — M79604 Pain in right leg: Secondary | ICD-10-CM | POA: Diagnosis not present

## 2014-09-19 DIAGNOSIS — Z91040 Latex allergy status: Secondary | ICD-10-CM | POA: Diagnosis not present

## 2014-09-19 DIAGNOSIS — Z967 Presence of other bone and tendon implants: Secondary | ICD-10-CM | POA: Diagnosis not present

## 2014-09-19 DIAGNOSIS — S82201G Unspecified fracture of shaft of right tibia, subsequent encounter for closed fracture with delayed healing: Secondary | ICD-10-CM | POA: Diagnosis not present

## 2014-09-19 DIAGNOSIS — Z886 Allergy status to analgesic agent status: Secondary | ICD-10-CM | POA: Diagnosis not present

## 2014-09-19 DIAGNOSIS — E119 Type 2 diabetes mellitus without complications: Secondary | ICD-10-CM | POA: Diagnosis not present

## 2014-09-19 DIAGNOSIS — Z794 Long term (current) use of insulin: Secondary | ICD-10-CM | POA: Diagnosis not present

## 2014-09-19 DIAGNOSIS — E785 Hyperlipidemia, unspecified: Secondary | ICD-10-CM | POA: Diagnosis not present

## 2014-09-19 DIAGNOSIS — J45909 Unspecified asthma, uncomplicated: Secondary | ICD-10-CM | POA: Diagnosis not present

## 2014-09-19 DIAGNOSIS — Z91048 Other nonmedicinal substance allergy status: Secondary | ICD-10-CM | POA: Diagnosis not present

## 2014-09-20 NOTE — Telephone Encounter (Signed)
Called and left voice message for call back to clinic.

## 2014-09-23 DIAGNOSIS — S82291K Other fracture of shaft of right tibia, subsequent encounter for closed fracture with nonunion: Secondary | ICD-10-CM | POA: Diagnosis not present

## 2014-09-23 DIAGNOSIS — G5621 Lesion of ulnar nerve, right upper limb: Secondary | ICD-10-CM | POA: Diagnosis not present

## 2014-09-25 DIAGNOSIS — S82831K Other fracture of upper and lower end of right fibula, subsequent encounter for closed fracture with nonunion: Secondary | ICD-10-CM | POA: Diagnosis not present

## 2014-09-25 DIAGNOSIS — S82301K Unspecified fracture of lower end of right tibia, subsequent encounter for closed fracture with nonunion: Secondary | ICD-10-CM | POA: Diagnosis not present

## 2014-09-26 DIAGNOSIS — S82201D Unspecified fracture of shaft of right tibia, subsequent encounter for closed fracture with routine healing: Secondary | ICD-10-CM | POA: Diagnosis not present

## 2014-09-26 DIAGNOSIS — M810 Age-related osteoporosis without current pathological fracture: Secondary | ICD-10-CM | POA: Diagnosis not present

## 2014-09-26 DIAGNOSIS — E119 Type 2 diabetes mellitus without complications: Secondary | ICD-10-CM | POA: Diagnosis not present

## 2014-09-26 DIAGNOSIS — S82401K Unspecified fracture of shaft of right fibula, subsequent encounter for closed fracture with nonunion: Secondary | ICD-10-CM | POA: Diagnosis not present

## 2014-09-26 DIAGNOSIS — S82201K Unspecified fracture of shaft of right tibia, subsequent encounter for closed fracture with nonunion: Secondary | ICD-10-CM | POA: Diagnosis not present

## 2014-09-27 NOTE — Telephone Encounter (Signed)
Called and left voice message for call back to clinic.

## 2014-10-17 ENCOUNTER — Other Ambulatory Visit: Payer: Self-pay | Admitting: Internal Medicine

## 2014-10-17 ENCOUNTER — Telehealth: Payer: Self-pay | Admitting: *Deleted

## 2014-10-17 NOTE — Telephone Encounter (Signed)
[place] Night - Client TELEPHONE [facility] Patient Name: Holly Weiss Gender: Female DOB: [birth year] Age: 74 Y 10 M 1 D Return Phone Number: [phone] (Primary) Address: City/State/Zip: [place] Client [facility] - Client Client Site [place] - Night Physician [name], [place] Type Call Call Type Triage / Clinical Relationship To Patient Self Return Phone Number [phone] (Primary) Chief Complaint Prescription Refill or Medication Request (non symptomatic) Initial Comment Caller states she needs [facility] @ [phone] notified that the caller needs Lantus Solostar and Novalin Flex Pen. The caller is completely out of insulin. The caller needs Crestor also. Nurse Assessment Guidelines Guideline Title Affirmed Question Affirmed Notes Nurse Date/Time (Eastern Time) Disp. Time Eilene Ghazi Time) Disposition Final User 10/15/2014 6:21:17 PM Pharmacy Call Wynetta Emery, RN, [facility] Reason: checking on refills of medications she takes. 10/15/2014 6:21:45 PM Clinical Call Yes Wynetta Emery, RN, [facility] After Care Instructions Given Call Event Type User Date / Time Description Comments User: Michele Rockers, RN Date/Time Eilene Ghazi Time): 10/15/2014 6:20:34 PM Nurse called Pharmacy and she has several refills on crestor and the Lantus Insulin; The RX for her Novalin Flex Pen expired 3-10 2016 ALERT patient will need a NEW RX for the Novalin Flex Pen called in to pharmacy Nurse Advised pharmacy to fill the crestor and the lantus at this time Also advised nurse the crestor expires 11-04-2014 so she will need new rx for it soon.

## 2014-10-17 NOTE — Telephone Encounter (Signed)
Refill sent to pharmacy...Holly Weiss

## 2014-10-17 NOTE — Telephone Encounter (Signed)
Pt called to check up on this request. Please help

## 2014-10-22 DIAGNOSIS — S82291K Other fracture of shaft of right tibia, subsequent encounter for closed fracture with nonunion: Secondary | ICD-10-CM | POA: Diagnosis not present

## 2014-10-22 DIAGNOSIS — G5621 Lesion of ulnar nerve, right upper limb: Secondary | ICD-10-CM | POA: Diagnosis not present

## 2014-10-23 ENCOUNTER — Telehealth: Payer: Self-pay | Admitting: Internal Medicine

## 2014-10-23 NOTE — Telephone Encounter (Signed)
Pt last saw md back in Dec fax dec labs...Holly Weiss

## 2014-10-23 NOTE — Telephone Encounter (Signed)
Patient requesting refill of NOVOLOG FLEXPEN 100 UNIT/ML FlexPen [[id number]] and "something for muscle spasms" as well. Sent to [facility]

## 2014-10-23 NOTE — Telephone Encounter (Signed)
[facility] is requesting last labs from [place]    Fax [phone] atten- [facility]

## 2014-10-24 MED ORDER — TIZANIDINE HCL 4 MG PO TABS: 4.0000 mg | ORAL_TABLET | Freq: Four times a day (QID) | ORAL | Status: DC | PRN

## 2014-10-24 NOTE — Telephone Encounter (Signed)
I did erx for tizanidine  Please do the novolog, thanks

## 2014-10-24 NOTE — Telephone Encounter (Signed)
Is ok for her to have the humalog instead?

## 2014-10-24 NOTE — Telephone Encounter (Signed)
Ok for [facility], whichever is covered

## 2014-10-24 NOTE — Telephone Encounter (Signed)
I can send the RX for the novolog. Not sure what she is wanting for muscle spasms.

## 2014-10-24 NOTE — Telephone Encounter (Signed)
Pharmacist states that insurance company does not cover NOVOLOG FLEXPEN 100 UNIT/ML FlexPen [[id number]] , and that humilog must be Rx to be covered. Please change Rx and resend. Thanks so much

## 2014-10-25 ENCOUNTER — Telehealth: Payer: Self-pay | Admitting: Internal Medicine

## 2014-10-25 MED ORDER — INSULIN LISPRO 100 UNIT/ML ~~LOC~~ SOLN: 15.0000 [IU] | Freq: Once | SUBCUTANEOUS | Status: DC

## 2014-10-25 NOTE — Telephone Encounter (Signed)
Sent humalog to pharmacy...[name]

## 2014-10-25 NOTE — Telephone Encounter (Signed)
States [facility] needs PA for [employer].  Patient is out of med

## 2014-10-29 NOTE — Telephone Encounter (Signed)
See previous msg insulin was change to [employer] will not cover novolog...[name]

## 2014-10-30 DIAGNOSIS — R9431 Abnormal electrocardiogram [ECG] [EKG]: Secondary | ICD-10-CM | POA: Diagnosis not present

## 2014-10-30 DIAGNOSIS — E785 Hyperlipidemia, unspecified: Secondary | ICD-10-CM | POA: Diagnosis not present

## 2014-10-30 DIAGNOSIS — I259 Chronic ischemic heart disease, unspecified: Secondary | ICD-10-CM | POA: Diagnosis not present

## 2014-11-09 DIAGNOSIS — I2510 Atherosclerotic heart disease of native coronary artery without angina pectoris: Secondary | ICD-10-CM | POA: Diagnosis not present

## 2014-11-09 DIAGNOSIS — I10 Essential (primary) hypertension: Secondary | ICD-10-CM | POA: Diagnosis not present

## 2014-11-09 DIAGNOSIS — Z91040 Latex allergy status: Secondary | ICD-10-CM | POA: Diagnosis not present

## 2014-11-09 DIAGNOSIS — Z885 Allergy status to narcotic agent status: Secondary | ICD-10-CM | POA: Diagnosis not present

## 2014-11-09 DIAGNOSIS — Z7902 Long term (current) use of antithrombotics/antiplatelets: Secondary | ICD-10-CM | POA: Diagnosis not present

## 2014-11-09 DIAGNOSIS — Z01818 Encounter for other preprocedural examination: Secondary | ICD-10-CM | POA: Diagnosis not present

## 2014-11-09 DIAGNOSIS — E785 Hyperlipidemia, unspecified: Secondary | ICD-10-CM | POA: Diagnosis not present

## 2014-11-09 DIAGNOSIS — Z9889 Other specified postprocedural states: Secondary | ICD-10-CM | POA: Diagnosis not present

## 2014-11-09 DIAGNOSIS — E669 Obesity, unspecified: Secondary | ICD-10-CM | POA: Diagnosis not present

## 2014-11-09 DIAGNOSIS — Z79899 Other long term (current) drug therapy: Secondary | ICD-10-CM | POA: Diagnosis not present

## 2014-11-09 DIAGNOSIS — R9431 Abnormal electrocardiogram [ECG] [EKG]: Secondary | ICD-10-CM | POA: Diagnosis not present

## 2014-11-09 DIAGNOSIS — M79604 Pain in right leg: Secondary | ICD-10-CM | POA: Diagnosis not present

## 2014-11-09 DIAGNOSIS — I259 Chronic ischemic heart disease, unspecified: Secondary | ICD-10-CM | POA: Diagnosis not present

## 2014-11-09 DIAGNOSIS — E119 Type 2 diabetes mellitus without complications: Secondary | ICD-10-CM | POA: Diagnosis not present

## 2014-11-09 DIAGNOSIS — Z91048 Other nonmedicinal substance allergy status: Secondary | ICD-10-CM | POA: Diagnosis not present

## 2014-11-13 DIAGNOSIS — I259 Chronic ischemic heart disease, unspecified: Secondary | ICD-10-CM | POA: Diagnosis not present

## 2014-11-13 DIAGNOSIS — M810 Age-related osteoporosis without current pathological fracture: Secondary | ICD-10-CM | POA: Diagnosis not present

## 2014-11-13 DIAGNOSIS — Z885 Allergy status to narcotic agent status: Secondary | ICD-10-CM | POA: Diagnosis not present

## 2014-11-13 DIAGNOSIS — J90 Pleural effusion, not elsewhere classified: Secondary | ICD-10-CM | POA: Diagnosis not present

## 2014-11-13 DIAGNOSIS — E785 Hyperlipidemia, unspecified: Secondary | ICD-10-CM | POA: Diagnosis not present

## 2014-11-13 DIAGNOSIS — Z96651 Presence of right artificial knee joint: Secondary | ICD-10-CM | POA: Diagnosis not present

## 2014-11-13 DIAGNOSIS — Z7951 Long term (current) use of inhaled steroids: Secondary | ICD-10-CM | POA: Diagnosis not present

## 2014-11-13 DIAGNOSIS — S82291K Other fracture of shaft of right tibia, subsequent encounter for closed fracture with nonunion: Secondary | ICD-10-CM | POA: Diagnosis not present

## 2014-11-13 DIAGNOSIS — J95821 Acute postprocedural respiratory failure: Secondary | ICD-10-CM | POA: Diagnosis not present

## 2014-11-13 DIAGNOSIS — D649 Anemia, unspecified: Secondary | ICD-10-CM | POA: Diagnosis not present

## 2014-11-13 DIAGNOSIS — R578 Other shock: Secondary | ICD-10-CM | POA: Diagnosis not present

## 2014-11-13 DIAGNOSIS — M79604 Pain in right leg: Secondary | ICD-10-CM | POA: Diagnosis not present

## 2014-11-13 DIAGNOSIS — M6281 Muscle weakness (generalized): Secondary | ICD-10-CM | POA: Diagnosis not present

## 2014-11-13 DIAGNOSIS — E118 Type 2 diabetes mellitus with unspecified complications: Secondary | ICD-10-CM | POA: Diagnosis not present

## 2014-11-13 DIAGNOSIS — K5900 Constipation, unspecified: Secondary | ICD-10-CM | POA: Diagnosis not present

## 2014-11-13 DIAGNOSIS — Z7982 Long term (current) use of aspirin: Secondary | ICD-10-CM | POA: Diagnosis not present

## 2014-11-13 DIAGNOSIS — J811 Chronic pulmonary edema: Secondary | ICD-10-CM | POA: Diagnosis not present

## 2014-11-13 DIAGNOSIS — Z9981 Dependence on supplemental oxygen: Secondary | ICD-10-CM | POA: Diagnosis not present

## 2014-11-13 DIAGNOSIS — R34 Anuria and oliguria: Secondary | ICD-10-CM | POA: Diagnosis not present

## 2014-11-13 DIAGNOSIS — R079 Chest pain, unspecified: Secondary | ICD-10-CM | POA: Diagnosis not present

## 2014-11-13 DIAGNOSIS — J9811 Atelectasis: Secondary | ICD-10-CM | POA: Diagnosis not present

## 2014-11-13 DIAGNOSIS — Z79891 Long term (current) use of opiate analgesic: Secondary | ICD-10-CM | POA: Diagnosis not present

## 2014-11-13 DIAGNOSIS — R0902 Hypoxemia: Secondary | ICD-10-CM | POA: Diagnosis not present

## 2014-11-13 DIAGNOSIS — G8929 Other chronic pain: Secondary | ICD-10-CM | POA: Diagnosis not present

## 2014-11-13 DIAGNOSIS — S82201S Unspecified fracture of shaft of right tibia, sequela: Secondary | ICD-10-CM | POA: Diagnosis not present

## 2014-11-13 DIAGNOSIS — E119 Type 2 diabetes mellitus without complications: Secondary | ICD-10-CM | POA: Diagnosis not present

## 2014-11-13 DIAGNOSIS — Z9181 History of falling: Secondary | ICD-10-CM | POA: Diagnosis not present

## 2014-11-13 DIAGNOSIS — S72001A Fracture of unspecified part of neck of right femur, initial encounter for closed fracture: Secondary | ICD-10-CM | POA: Diagnosis not present

## 2014-11-13 DIAGNOSIS — E1165 Type 2 diabetes mellitus with hyperglycemia: Secondary | ICD-10-CM | POA: Diagnosis not present

## 2014-11-13 DIAGNOSIS — I2510 Atherosclerotic heart disease of native coronary artery without angina pectoris: Secondary | ICD-10-CM | POA: Diagnosis not present

## 2014-11-13 DIAGNOSIS — M1990 Unspecified osteoarthritis, unspecified site: Secondary | ICD-10-CM | POA: Diagnosis not present

## 2014-11-13 DIAGNOSIS — D696 Thrombocytopenia, unspecified: Secondary | ICD-10-CM | POA: Diagnosis not present

## 2014-11-13 DIAGNOSIS — R9431 Abnormal electrocardiogram [ECG] [EKG]: Secondary | ICD-10-CM | POA: Diagnosis not present

## 2014-11-13 DIAGNOSIS — I10 Essential (primary) hypertension: Secondary | ICD-10-CM | POA: Diagnosis not present

## 2014-11-13 DIAGNOSIS — S82201G Unspecified fracture of shaft of right tibia, subsequent encounter for closed fracture with delayed healing: Secondary | ICD-10-CM | POA: Diagnosis not present

## 2014-11-13 DIAGNOSIS — Z01818 Encounter for other preprocedural examination: Secondary | ICD-10-CM | POA: Diagnosis not present

## 2014-11-13 DIAGNOSIS — G5621 Lesion of ulnar nerve, right upper limb: Secondary | ICD-10-CM | POA: Diagnosis not present

## 2014-11-13 DIAGNOSIS — Z7409 Other reduced mobility: Secondary | ICD-10-CM | POA: Diagnosis not present

## 2014-11-13 DIAGNOSIS — J45909 Unspecified asthma, uncomplicated: Secondary | ICD-10-CM | POA: Diagnosis not present

## 2014-11-13 DIAGNOSIS — G8918 Other acute postprocedural pain: Secondary | ICD-10-CM | POA: Diagnosis not present

## 2014-11-13 DIAGNOSIS — J4520 Mild intermittent asthma, uncomplicated: Secondary | ICD-10-CM | POA: Diagnosis not present

## 2014-11-13 DIAGNOSIS — Z01810 Encounter for preprocedural cardiovascular examination: Secondary | ICD-10-CM | POA: Diagnosis not present

## 2014-11-13 DIAGNOSIS — Z794 Long term (current) use of insulin: Secondary | ICD-10-CM | POA: Diagnosis not present

## 2014-11-13 DIAGNOSIS — Z951 Presence of aortocoronary bypass graft: Secondary | ICD-10-CM | POA: Diagnosis not present

## 2014-11-13 DIAGNOSIS — D62 Acute posthemorrhagic anemia: Secondary | ICD-10-CM | POA: Diagnosis not present

## 2014-11-13 DIAGNOSIS — Z91048 Other nonmedicinal substance allergy status: Secondary | ICD-10-CM | POA: Diagnosis not present

## 2014-11-13 DIAGNOSIS — Z91040 Latex allergy status: Secondary | ICD-10-CM | POA: Diagnosis not present

## 2014-11-14 DIAGNOSIS — E119 Type 2 diabetes mellitus without complications: Secondary | ICD-10-CM | POA: Diagnosis not present

## 2014-11-14 DIAGNOSIS — Z01810 Encounter for preprocedural cardiovascular examination: Secondary | ICD-10-CM | POA: Diagnosis not present

## 2014-11-14 DIAGNOSIS — J4520 Mild intermittent asthma, uncomplicated: Secondary | ICD-10-CM | POA: Diagnosis not present

## 2014-11-14 DIAGNOSIS — S82201G Unspecified fracture of shaft of right tibia, subsequent encounter for closed fracture with delayed healing: Secondary | ICD-10-CM | POA: Diagnosis not present

## 2014-11-14 DIAGNOSIS — Z9181 History of falling: Secondary | ICD-10-CM | POA: Diagnosis not present

## 2014-11-14 DIAGNOSIS — M810 Age-related osteoporosis without current pathological fracture: Secondary | ICD-10-CM | POA: Diagnosis not present

## 2014-11-16 DIAGNOSIS — I2510 Atherosclerotic heart disease of native coronary artery without angina pectoris: Secondary | ICD-10-CM | POA: Insufficient documentation

## 2014-11-22 DIAGNOSIS — E780 Pure hypercholesterolemia: Secondary | ICD-10-CM | POA: Diagnosis not present

## 2014-11-22 DIAGNOSIS — S82201S Unspecified fracture of shaft of right tibia, sequela: Secondary | ICD-10-CM | POA: Diagnosis not present

## 2014-11-22 DIAGNOSIS — D649 Anemia, unspecified: Secondary | ICD-10-CM | POA: Diagnosis not present

## 2014-11-22 DIAGNOSIS — Z9981 Dependence on supplemental oxygen: Secondary | ICD-10-CM | POA: Diagnosis not present

## 2014-11-22 DIAGNOSIS — M810 Age-related osteoporosis without current pathological fracture: Secondary | ICD-10-CM | POA: Diagnosis not present

## 2014-11-22 DIAGNOSIS — S72001A Fracture of unspecified part of neck of right femur, initial encounter for closed fracture: Secondary | ICD-10-CM | POA: Diagnosis not present

## 2014-11-22 DIAGNOSIS — G5621 Lesion of ulnar nerve, right upper limb: Secondary | ICD-10-CM | POA: Diagnosis not present

## 2014-11-22 DIAGNOSIS — Z7409 Other reduced mobility: Secondary | ICD-10-CM | POA: Diagnosis not present

## 2014-11-22 DIAGNOSIS — I2510 Atherosclerotic heart disease of native coronary artery without angina pectoris: Secondary | ICD-10-CM | POA: Diagnosis not present

## 2014-11-22 DIAGNOSIS — M6281 Muscle weakness (generalized): Secondary | ICD-10-CM | POA: Diagnosis not present

## 2014-11-22 DIAGNOSIS — E785 Hyperlipidemia, unspecified: Secondary | ICD-10-CM | POA: Diagnosis not present

## 2014-11-22 DIAGNOSIS — E119 Type 2 diabetes mellitus without complications: Secondary | ICD-10-CM | POA: Diagnosis not present

## 2014-11-22 DIAGNOSIS — J45909 Unspecified asthma, uncomplicated: Secondary | ICD-10-CM | POA: Diagnosis not present

## 2014-11-22 DIAGNOSIS — S82291K Other fracture of shaft of right tibia, subsequent encounter for closed fracture with nonunion: Secondary | ICD-10-CM | POA: Diagnosis not present

## 2014-11-26 DIAGNOSIS — D649 Anemia, unspecified: Secondary | ICD-10-CM | POA: Diagnosis not present

## 2014-11-26 DIAGNOSIS — E780 Pure hypercholesterolemia: Secondary | ICD-10-CM | POA: Diagnosis not present

## 2014-11-26 DIAGNOSIS — E119 Type 2 diabetes mellitus without complications: Secondary | ICD-10-CM | POA: Diagnosis not present

## 2014-11-26 DIAGNOSIS — I2510 Atherosclerotic heart disease of native coronary artery without angina pectoris: Secondary | ICD-10-CM | POA: Diagnosis not present

## 2014-11-30 ENCOUNTER — Telehealth: Payer: Self-pay | Admitting: Internal Medicine

## 2014-11-30 DIAGNOSIS — Z48812 Encounter for surgical aftercare following surgery on the circulatory system: Secondary | ICD-10-CM | POA: Diagnosis not present

## 2014-11-30 DIAGNOSIS — R2689 Other abnormalities of gait and mobility: Secondary | ICD-10-CM | POA: Diagnosis not present

## 2014-11-30 DIAGNOSIS — F0392 Unspecified dementia, unspecified severity, with psychotic disturbance: Secondary | ICD-10-CM

## 2014-11-30 DIAGNOSIS — F0391 Unspecified dementia with behavioral disturbance: Secondary | ICD-10-CM

## 2014-11-30 DIAGNOSIS — I10 Essential (primary) hypertension: Secondary | ICD-10-CM | POA: Diagnosis not present

## 2014-11-30 DIAGNOSIS — I509 Heart failure, unspecified: Secondary | ICD-10-CM | POA: Diagnosis not present

## 2014-11-30 DIAGNOSIS — E119 Type 2 diabetes mellitus without complications: Secondary | ICD-10-CM | POA: Diagnosis not present

## 2014-11-30 NOTE — Telephone Encounter (Signed)
Home health referral done

## 2014-11-30 NOTE — Telephone Encounter (Signed)
She just wanted to let you know they will be needing to do PT, OT and an aid.

## 2014-12-03 DIAGNOSIS — R2689 Other abnormalities of gait and mobility: Secondary | ICD-10-CM | POA: Diagnosis not present

## 2014-12-03 DIAGNOSIS — E119 Type 2 diabetes mellitus without complications: Secondary | ICD-10-CM | POA: Diagnosis not present

## 2014-12-03 DIAGNOSIS — Z48812 Encounter for surgical aftercare following surgery on the circulatory system: Secondary | ICD-10-CM | POA: Diagnosis not present

## 2014-12-03 DIAGNOSIS — I509 Heart failure, unspecified: Secondary | ICD-10-CM | POA: Diagnosis not present

## 2014-12-03 DIAGNOSIS — I10 Essential (primary) hypertension: Secondary | ICD-10-CM | POA: Diagnosis not present

## 2014-12-03 IMAGING — RF DG C-ARM 61-120 MIN
1 series · 2 of 2 positions shown · non-contrast
Comparison: Two digital C-arm fluoroscopic images obtained
intraoperatively are compared to preoperative studies of 07/12/2013

CLINICAL DATA: Bb RIGHT tibial repair with nonunion/malunion

EXAM:
RIGHT TIBIA AND FIBULA - 2 VIEW

[Series 1: run · 2 of 2 slices shown]
[im 1/2]
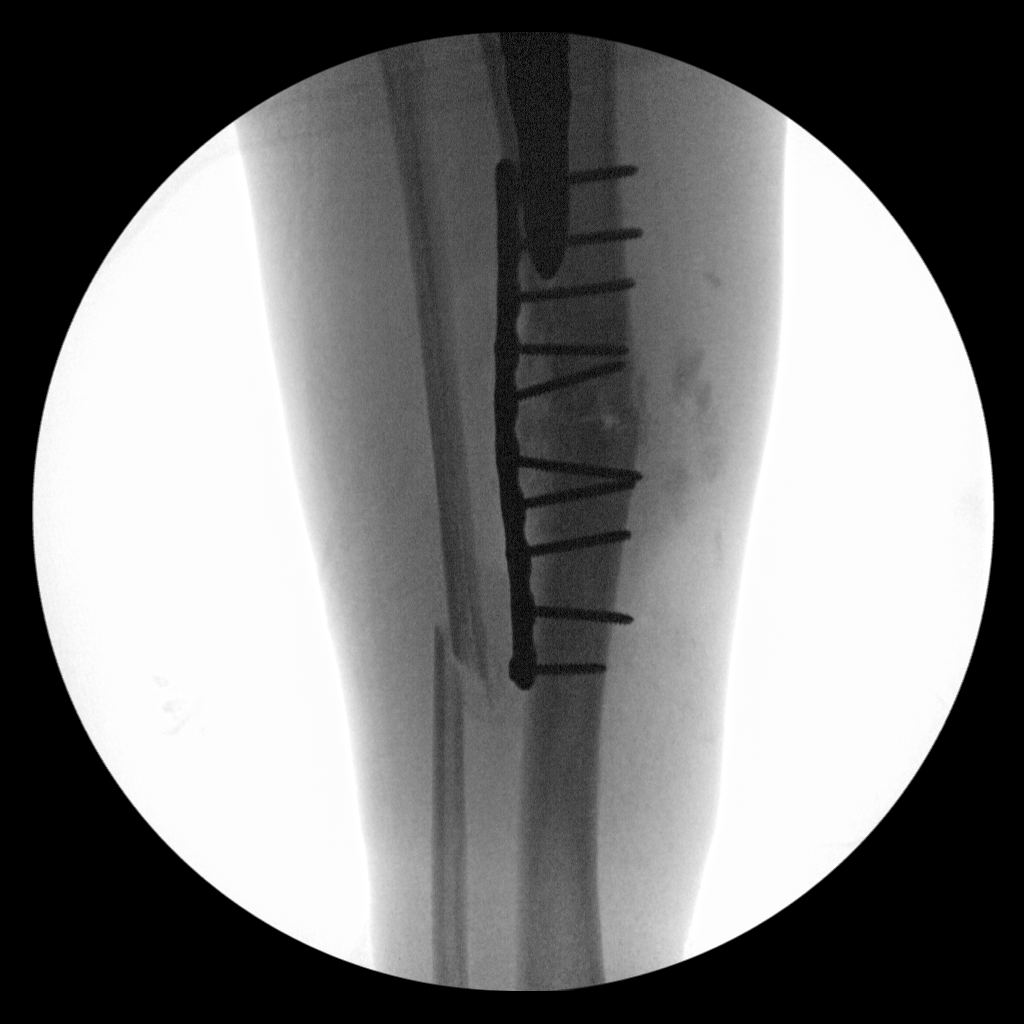
[im 2/2]
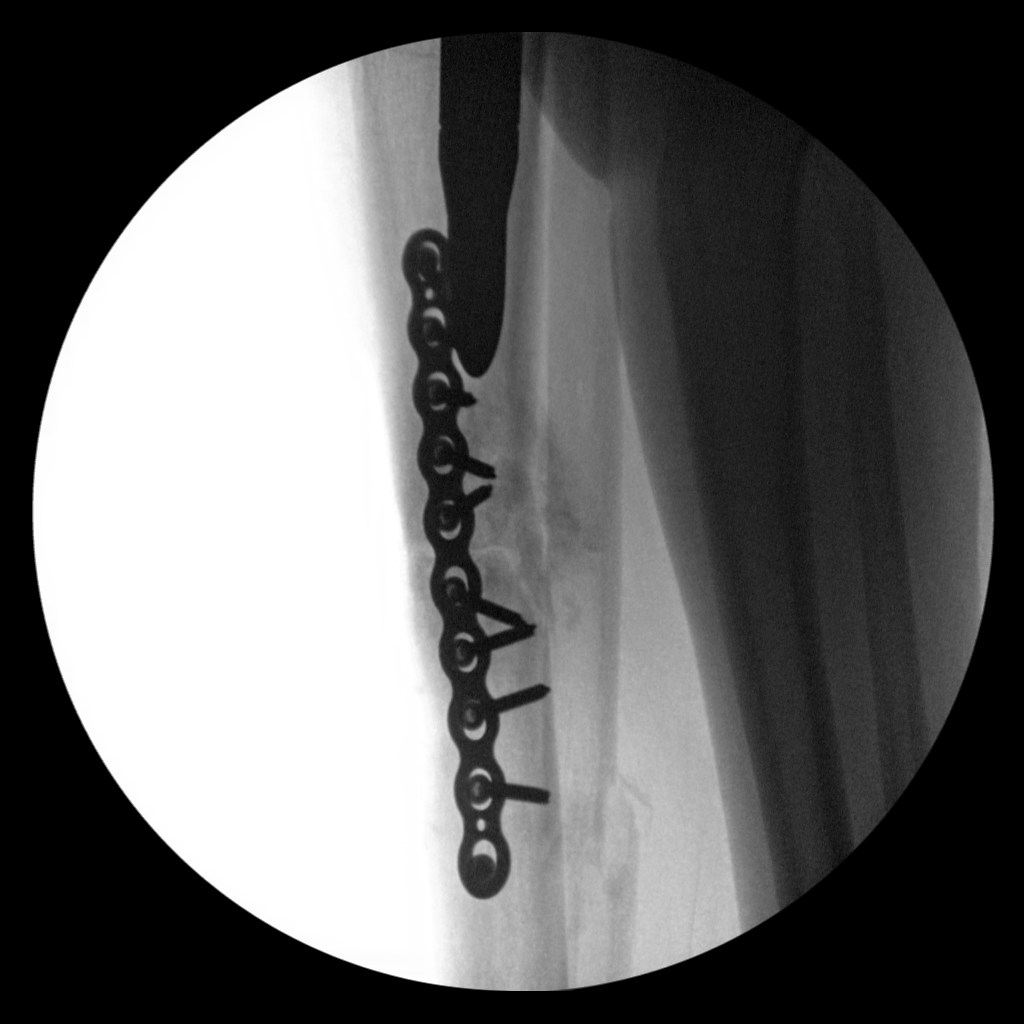

[2 of 2 positions shown; findings below may reference images not displayed]

FINDINGS: Images consist of the AP and lateral views of the proximal to mid
RIGHT lower leg.

Knee excluded.

Oblique displaced mid fibular fracture.

Stem of a tibial component of a RIGHT knee prosthesis is noted.

Lateral plate and 10 screws are present post ORIF of the proximal to
mid RIGHT tibial diaphysis.

Osseous demineralization.

Question debris versus artifacts projecting medial to the tibia at
the surgical site.
IMPRESSION: Post ORIF of the proximal to mid RIGHT tibial diaphysis.

Oblique minimally displaced mid RIGHT fibular diaphyseal fracture.

## 2014-12-03 IMAGING — CR DG TIBIA/FIBULA PORT 2V*R*
2 series · 2 of 2 positions shown · non-contrast
Comparison: Intraoperative imaging. Prior tibia and fibula of
07/12/2013.

CLINICAL DATA: Postop

EXAM:
PORTABLE RIGHT TIBIA AND FIBULA - 2 VIEW

[AP]
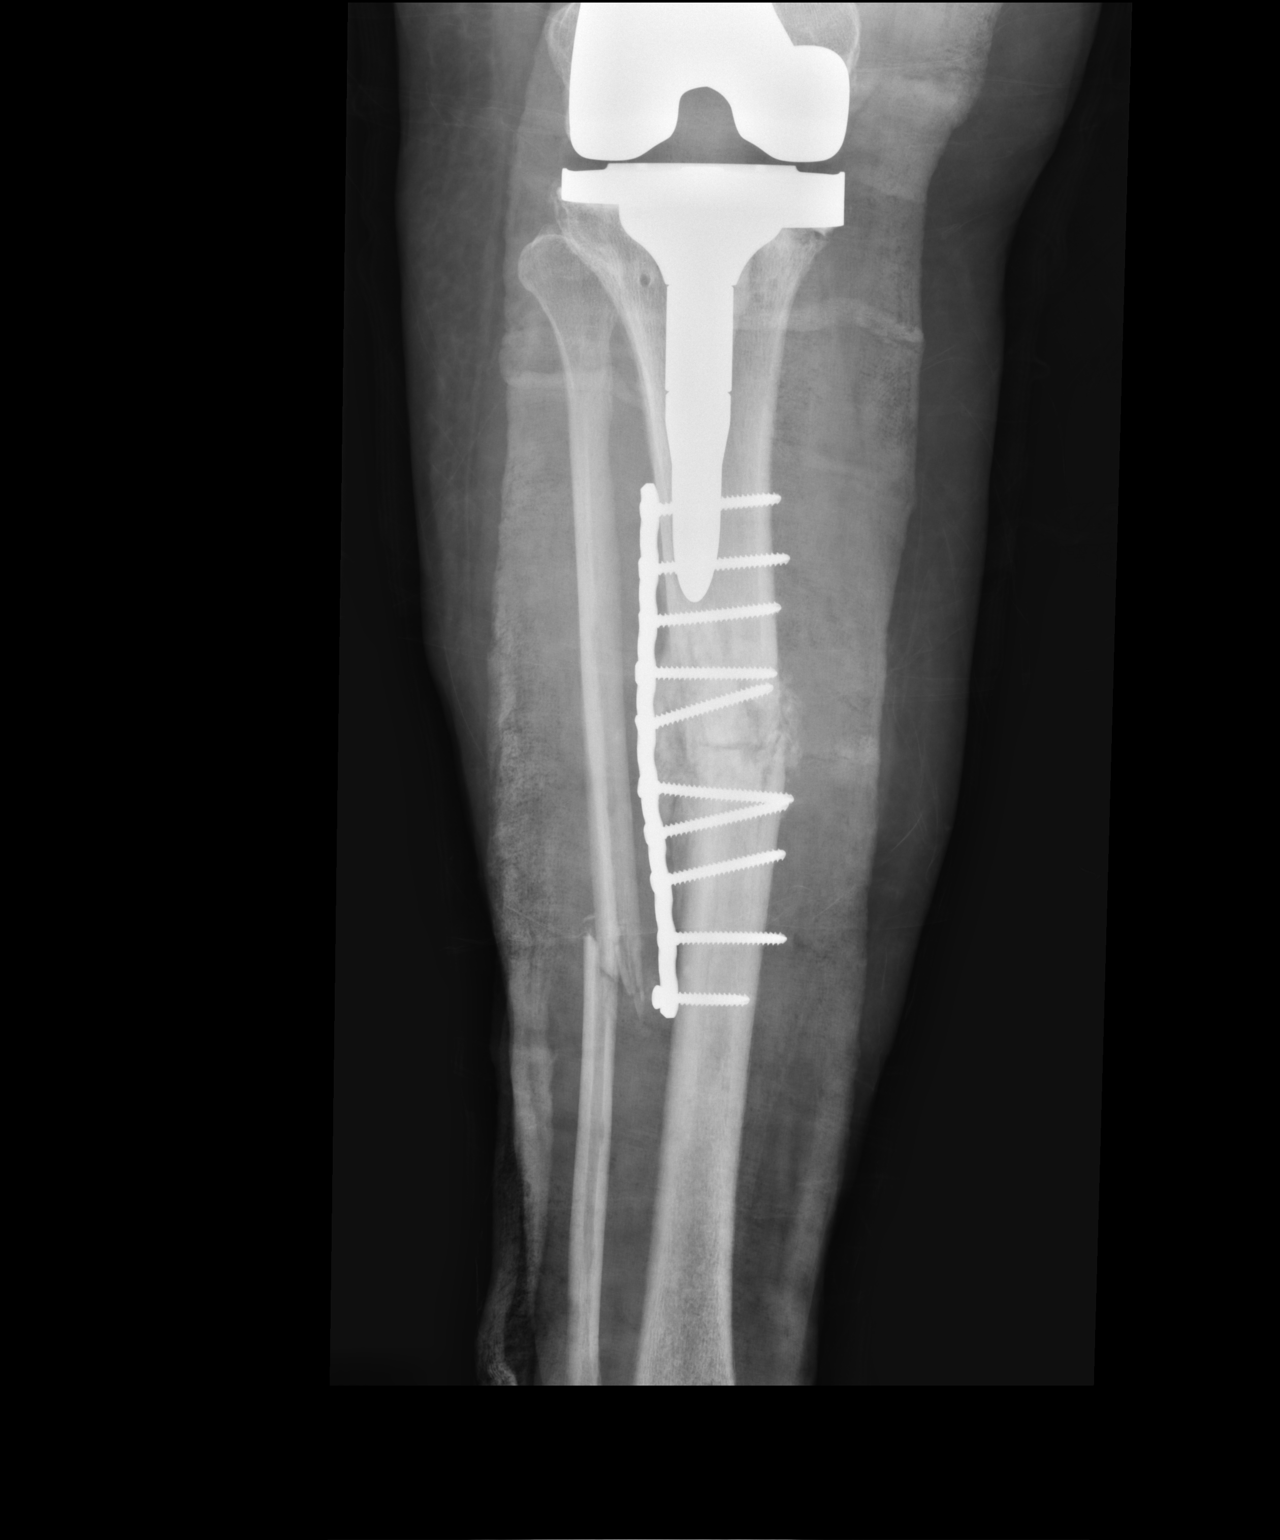

[lateral]
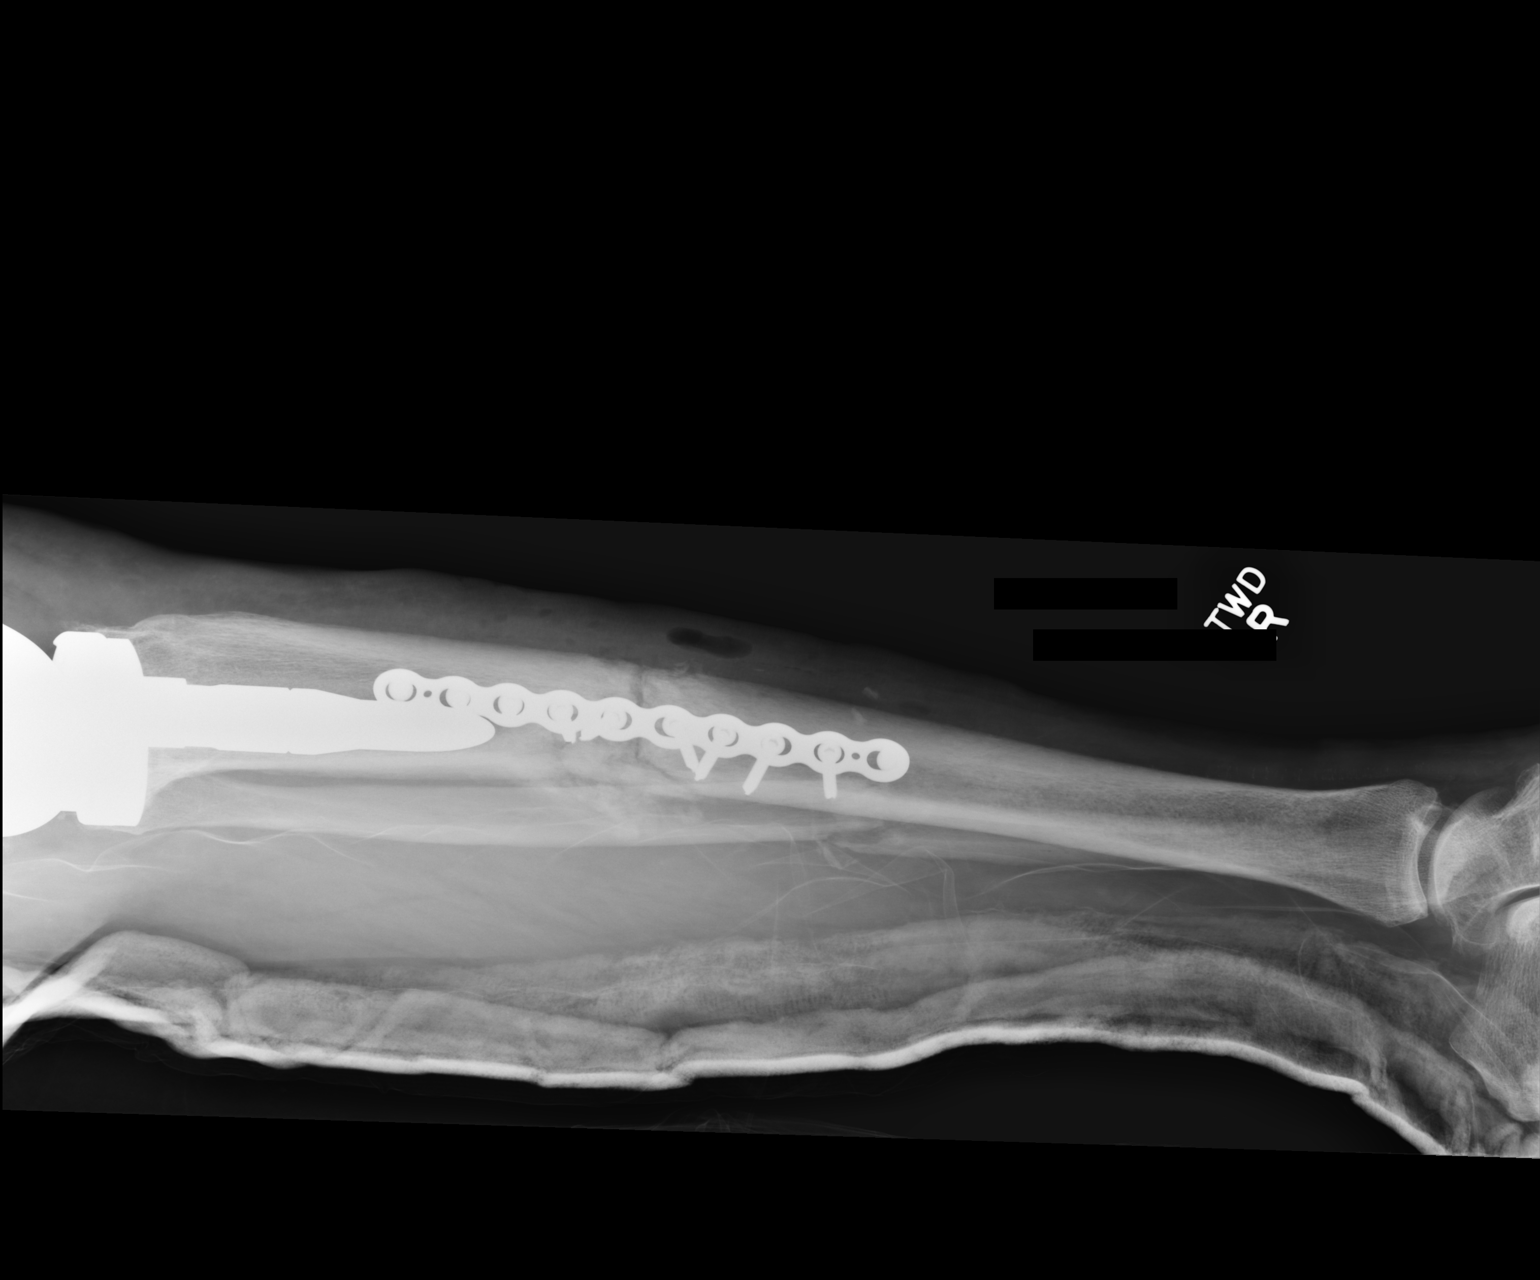

[2 of 2 positions shown; findings below may reference images not displayed]

FINDINGS: Plate and screw fixation of device noted across 8 midshaft right
tibial fracture. There is callus formation suggesting this is an
old/healing fracture, possibly with nonunion. Mildly displaced
midshaft fibular fracture present. Changes of right knee replacement
again noted.
IMPRESSION: Plate and screw fixation across a midshaft tibial fracture.

## 2014-12-04 DIAGNOSIS — Z48812 Encounter for surgical aftercare following surgery on the circulatory system: Secondary | ICD-10-CM | POA: Diagnosis not present

## 2014-12-04 DIAGNOSIS — I509 Heart failure, unspecified: Secondary | ICD-10-CM | POA: Diagnosis not present

## 2014-12-04 DIAGNOSIS — E119 Type 2 diabetes mellitus without complications: Secondary | ICD-10-CM | POA: Diagnosis not present

## 2014-12-04 DIAGNOSIS — I10 Essential (primary) hypertension: Secondary | ICD-10-CM | POA: Diagnosis not present

## 2014-12-04 DIAGNOSIS — R2689 Other abnormalities of gait and mobility: Secondary | ICD-10-CM | POA: Diagnosis not present

## 2014-12-05 DIAGNOSIS — I10 Essential (primary) hypertension: Secondary | ICD-10-CM | POA: Diagnosis not present

## 2014-12-05 DIAGNOSIS — I509 Heart failure, unspecified: Secondary | ICD-10-CM | POA: Diagnosis not present

## 2014-12-05 DIAGNOSIS — R2689 Other abnormalities of gait and mobility: Secondary | ICD-10-CM | POA: Diagnosis not present

## 2014-12-05 DIAGNOSIS — Z48812 Encounter for surgical aftercare following surgery on the circulatory system: Secondary | ICD-10-CM | POA: Diagnosis not present

## 2014-12-05 DIAGNOSIS — E119 Type 2 diabetes mellitus without complications: Secondary | ICD-10-CM | POA: Diagnosis not present

## 2014-12-07 DIAGNOSIS — I509 Heart failure, unspecified: Secondary | ICD-10-CM | POA: Diagnosis not present

## 2014-12-07 DIAGNOSIS — E119 Type 2 diabetes mellitus without complications: Secondary | ICD-10-CM | POA: Diagnosis not present

## 2014-12-07 DIAGNOSIS — Z48812 Encounter for surgical aftercare following surgery on the circulatory system: Secondary | ICD-10-CM | POA: Diagnosis not present

## 2014-12-07 DIAGNOSIS — I10 Essential (primary) hypertension: Secondary | ICD-10-CM | POA: Diagnosis not present

## 2014-12-07 DIAGNOSIS — R2689 Other abnormalities of gait and mobility: Secondary | ICD-10-CM | POA: Diagnosis not present

## 2014-12-12 ENCOUNTER — Other Ambulatory Visit: Payer: Self-pay | Admitting: Internal Medicine

## 2014-12-12 ENCOUNTER — Telehealth: Payer: Self-pay | Admitting: Internal Medicine

## 2014-12-12 NOTE — Telephone Encounter (Signed)
Bhavn pt with [facility] [phone]  Requesting for orders to get pt a Hospital bed and a wheelchair

## 2014-12-13 DIAGNOSIS — I2510 Atherosclerotic heart disease of native coronary artery without angina pectoris: Secondary | ICD-10-CM | POA: Diagnosis not present

## 2014-12-14 DIAGNOSIS — Z48812 Encounter for surgical aftercare following surgery on the circulatory system: Secondary | ICD-10-CM | POA: Diagnosis not present

## 2014-12-14 DIAGNOSIS — E119 Type 2 diabetes mellitus without complications: Secondary | ICD-10-CM | POA: Diagnosis not present

## 2014-12-14 DIAGNOSIS — R2689 Other abnormalities of gait and mobility: Secondary | ICD-10-CM | POA: Diagnosis not present

## 2014-12-14 DIAGNOSIS — I10 Essential (primary) hypertension: Secondary | ICD-10-CM | POA: Diagnosis not present

## 2014-12-14 DIAGNOSIS — I509 Heart failure, unspecified: Secondary | ICD-10-CM | POA: Diagnosis not present

## 2014-12-14 NOTE — Telephone Encounter (Signed)
Done hardcopy to steph 

## 2014-12-14 NOTE — Telephone Encounter (Signed)
Faxed order to [facility] @ [phone]...Holly Weiss

## 2014-12-18 ENCOUNTER — Telehealth: Payer: Self-pay | Admitting: Internal Medicine

## 2014-12-18 DIAGNOSIS — I509 Heart failure, unspecified: Secondary | ICD-10-CM | POA: Diagnosis not present

## 2014-12-18 DIAGNOSIS — E119 Type 2 diabetes mellitus without complications: Secondary | ICD-10-CM | POA: Diagnosis not present

## 2014-12-18 DIAGNOSIS — I10 Essential (primary) hypertension: Secondary | ICD-10-CM | POA: Diagnosis not present

## 2014-12-18 DIAGNOSIS — R2689 Other abnormalities of gait and mobility: Secondary | ICD-10-CM | POA: Diagnosis not present

## 2014-12-18 DIAGNOSIS — Z48812 Encounter for surgical aftercare following surgery on the circulatory system: Secondary | ICD-10-CM | POA: Diagnosis not present

## 2014-12-18 MED ORDER — OXYCODONE HCL 5 MG PO TABS: 5.0000 mg | ORAL_TABLET | Freq: Three times a day (TID) | ORAL | Status: DC | PRN

## 2014-12-18 NOTE — Telephone Encounter (Signed)
Done hardcopy to Dahlia  

## 2014-12-18 NOTE — Telephone Encounter (Signed)
Out of oxycodone and patient would like refill.  Please call patient for pick up.

## 2014-12-19 NOTE — Telephone Encounter (Signed)
Pt advised that Rx is available for pick up via home VM. Rx in cabinet

## 2014-12-20 DIAGNOSIS — M542 Cervicalgia: Secondary | ICD-10-CM | POA: Diagnosis not present

## 2014-12-21 DIAGNOSIS — I10 Essential (primary) hypertension: Secondary | ICD-10-CM | POA: Diagnosis not present

## 2014-12-21 DIAGNOSIS — I509 Heart failure, unspecified: Secondary | ICD-10-CM | POA: Diagnosis not present

## 2014-12-21 DIAGNOSIS — Z48812 Encounter for surgical aftercare following surgery on the circulatory system: Secondary | ICD-10-CM | POA: Diagnosis not present

## 2014-12-21 DIAGNOSIS — E119 Type 2 diabetes mellitus without complications: Secondary | ICD-10-CM | POA: Diagnosis not present

## 2014-12-21 DIAGNOSIS — R2689 Other abnormalities of gait and mobility: Secondary | ICD-10-CM | POA: Diagnosis not present

## 2014-12-22 DIAGNOSIS — G5621 Lesion of ulnar nerve, right upper limb: Secondary | ICD-10-CM | POA: Diagnosis not present

## 2014-12-22 DIAGNOSIS — S82291K Other fracture of shaft of right tibia, subsequent encounter for closed fracture with nonunion: Secondary | ICD-10-CM | POA: Diagnosis not present

## 2014-12-24 DIAGNOSIS — I509 Heart failure, unspecified: Secondary | ICD-10-CM | POA: Diagnosis not present

## 2014-12-24 DIAGNOSIS — E119 Type 2 diabetes mellitus without complications: Secondary | ICD-10-CM | POA: Diagnosis not present

## 2014-12-24 DIAGNOSIS — Z48812 Encounter for surgical aftercare following surgery on the circulatory system: Secondary | ICD-10-CM | POA: Diagnosis not present

## 2014-12-24 DIAGNOSIS — I10 Essential (primary) hypertension: Secondary | ICD-10-CM | POA: Diagnosis not present

## 2014-12-24 DIAGNOSIS — R2689 Other abnormalities of gait and mobility: Secondary | ICD-10-CM | POA: Diagnosis not present

## 2014-12-25 ENCOUNTER — Other Ambulatory Visit: Payer: Self-pay | Admitting: Internal Medicine

## 2014-12-25 DIAGNOSIS — Z48812 Encounter for surgical aftercare following surgery on the circulatory system: Secondary | ICD-10-CM | POA: Diagnosis not present

## 2014-12-25 DIAGNOSIS — I509 Heart failure, unspecified: Secondary | ICD-10-CM | POA: Diagnosis not present

## 2014-12-25 DIAGNOSIS — I10 Essential (primary) hypertension: Secondary | ICD-10-CM | POA: Diagnosis not present

## 2014-12-25 DIAGNOSIS — R2689 Other abnormalities of gait and mobility: Secondary | ICD-10-CM | POA: Diagnosis not present

## 2014-12-25 DIAGNOSIS — E119 Type 2 diabetes mellitus without complications: Secondary | ICD-10-CM | POA: Diagnosis not present

## 2014-12-25 NOTE — Telephone Encounter (Signed)
Patient need refill of novalog monthy,  and  Would he put in for her a electric hospital bed, please advise [phone]

## 2014-12-25 NOTE — Telephone Encounter (Signed)
Bed ordered - Done hardcopy to [facility]

## 2014-12-25 NOTE — Telephone Encounter (Signed)
Novolog handled, please advise on hospital bed order

## 2014-12-26 ENCOUNTER — Telehealth: Payer: Self-pay

## 2014-12-26 NOTE — Telephone Encounter (Signed)
Pt's daughter informed that order for DME hospital bed faxed to [facility] at [phone]

## 2014-12-26 NOTE — Telephone Encounter (Signed)
Pt states that she cannot use Humalog because it causes blurred vision (per [facility] more likely related to diabetes) Rx filled sot Novolog sent to pharmacy but PA is required. Awaiting paperwork from pharmacy  There is no number to contact [facility] facility to fax order for hospital bed. Will wait for a call back from RN. Order is on CMA's desk waiting to be faxed

## 2014-12-27 ENCOUNTER — Telehealth: Payer: Self-pay

## 2014-12-27 DIAGNOSIS — I509 Heart failure, unspecified: Secondary | ICD-10-CM | POA: Diagnosis not present

## 2014-12-27 DIAGNOSIS — E119 Type 2 diabetes mellitus without complications: Secondary | ICD-10-CM | POA: Diagnosis not present

## 2014-12-27 DIAGNOSIS — Z48812 Encounter for surgical aftercare following surgery on the circulatory system: Secondary | ICD-10-CM | POA: Diagnosis not present

## 2014-12-27 DIAGNOSIS — I10 Essential (primary) hypertension: Secondary | ICD-10-CM | POA: Diagnosis not present

## 2014-12-27 DIAGNOSIS — R2689 Other abnormalities of gait and mobility: Secondary | ICD-10-CM | POA: Diagnosis not present

## 2014-12-27 NOTE — Telephone Encounter (Signed)
[facility] called requesting verbal authorization for a social worker to come out to evaluate pt for community resources. Verbal authorization given per MD protocol.

## 2015-01-01 ENCOUNTER — Telehealth: Payer: Self-pay | Admitting: Internal Medicine

## 2015-01-01 DIAGNOSIS — R2689 Other abnormalities of gait and mobility: Secondary | ICD-10-CM | POA: Diagnosis not present

## 2015-01-01 DIAGNOSIS — Z48812 Encounter for surgical aftercare following surgery on the circulatory system: Secondary | ICD-10-CM | POA: Diagnosis not present

## 2015-01-01 DIAGNOSIS — E119 Type 2 diabetes mellitus without complications: Secondary | ICD-10-CM | POA: Diagnosis not present

## 2015-01-01 DIAGNOSIS — I509 Heart failure, unspecified: Secondary | ICD-10-CM | POA: Diagnosis not present

## 2015-01-01 DIAGNOSIS — I10 Essential (primary) hypertension: Secondary | ICD-10-CM | POA: Diagnosis not present

## 2015-01-01 NOTE — Telephone Encounter (Signed)
Patient is advising that NOVOLOG FLEXPEN 100 UNIT/ML FlexPen [[id number]] needs prior auth. Please call patient with update @ # listed

## 2015-01-01 NOTE — Telephone Encounter (Signed)
PA in process

## 2015-01-02 ENCOUNTER — Telehealth: Payer: Self-pay | Admitting: *Deleted

## 2015-01-02 NOTE — Telephone Encounter (Signed)
[facility] Night - Client TELEPHONE [facility] Patient Name: CONSTANCE WHITTLE Gender: Female DOB: 04-27-1941 Age: 74 Y 11 D Return Phone Number: [phone] (Primary) Address: City/State/Zip: [place] [employer] [facility] Night - Client Client Site [place] - Night Physician Cathlean Cower Contact Type Call Call Type Page Only Caller Name Katelyn Relationship To Patient Provider Is this call to report lab results? 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 CB# [phone] Nurse Assessment Guidelines Guideline Title Affirmed Question Affirmed Notes Nurse Date/Time Eilene Ghazi Time) Disp. Time Eilene Ghazi Time) Disposition Final User 12/25/2014 6:51:27 PM Send to [place], [place] 12/25/2014 7:00:52 PM Called On-Call Provider Tonette Bihari 12/25/2014 7:01:11 PM Page Completed Yes Tonette Bihari After Care Instructions Given Call Event Type User Date / Time Description Paging [facility] Phone DateTime Result/Outcome Message Type Notes Unice Cobble [phone] 12/25/2014 7:00:52 PM Called On Call Provider - Reached Doctor Paged Unice Cobble 12/25/2014 7:00:59 PM Paged On Call to Another Provider Message Result

## 2015-01-02 NOTE — Telephone Encounter (Signed)
PA approved, pharmacy to inform pt

## 2015-01-04 DIAGNOSIS — R2689 Other abnormalities of gait and mobility: Secondary | ICD-10-CM | POA: Diagnosis not present

## 2015-01-04 DIAGNOSIS — I509 Heart failure, unspecified: Secondary | ICD-10-CM | POA: Diagnosis not present

## 2015-01-04 DIAGNOSIS — Z48812 Encounter for surgical aftercare following surgery on the circulatory system: Secondary | ICD-10-CM | POA: Diagnosis not present

## 2015-01-04 DIAGNOSIS — I10 Essential (primary) hypertension: Secondary | ICD-10-CM | POA: Diagnosis not present

## 2015-01-04 DIAGNOSIS — E119 Type 2 diabetes mellitus without complications: Secondary | ICD-10-CM | POA: Diagnosis not present

## 2015-01-08 DIAGNOSIS — I509 Heart failure, unspecified: Secondary | ICD-10-CM | POA: Diagnosis not present

## 2015-01-08 DIAGNOSIS — Z993 Dependence on wheelchair: Secondary | ICD-10-CM | POA: Diagnosis not present

## 2015-01-08 DIAGNOSIS — I10 Essential (primary) hypertension: Secondary | ICD-10-CM | POA: Diagnosis not present

## 2015-01-08 DIAGNOSIS — Z7982 Long term (current) use of aspirin: Secondary | ICD-10-CM | POA: Diagnosis not present

## 2015-01-08 DIAGNOSIS — I255 Ischemic cardiomyopathy: Secondary | ICD-10-CM | POA: Diagnosis not present

## 2015-01-08 DIAGNOSIS — E785 Hyperlipidemia, unspecified: Secondary | ICD-10-CM | POA: Diagnosis not present

## 2015-01-08 DIAGNOSIS — I159 Secondary hypertension, unspecified: Secondary | ICD-10-CM | POA: Diagnosis not present

## 2015-01-08 DIAGNOSIS — E119 Type 2 diabetes mellitus without complications: Secondary | ICD-10-CM | POA: Diagnosis not present

## 2015-01-08 DIAGNOSIS — Z794 Long term (current) use of insulin: Secondary | ICD-10-CM | POA: Diagnosis not present

## 2015-01-08 DIAGNOSIS — I259 Chronic ischemic heart disease, unspecified: Secondary | ICD-10-CM | POA: Diagnosis not present

## 2015-01-08 DIAGNOSIS — R600 Localized edema: Secondary | ICD-10-CM | POA: Diagnosis not present

## 2015-01-08 DIAGNOSIS — I2510 Atherosclerotic heart disease of native coronary artery without angina pectoris: Secondary | ICD-10-CM | POA: Diagnosis not present

## 2015-01-08 DIAGNOSIS — Z951 Presence of aortocoronary bypass graft: Secondary | ICD-10-CM | POA: Diagnosis not present

## 2015-01-08 DIAGNOSIS — Z79899 Other long term (current) drug therapy: Secondary | ICD-10-CM | POA: Diagnosis not present

## 2015-01-08 DIAGNOSIS — E669 Obesity, unspecified: Secondary | ICD-10-CM | POA: Diagnosis not present

## 2015-01-09 ENCOUNTER — Other Ambulatory Visit (HOSPITAL_COMMUNITY): Payer: Self-pay | Admitting: Psychiatry

## 2015-01-09 ENCOUNTER — Telehealth: Payer: Self-pay | Admitting: Internal Medicine

## 2015-01-09 DIAGNOSIS — E119 Type 2 diabetes mellitus without complications: Secondary | ICD-10-CM | POA: Diagnosis not present

## 2015-01-09 DIAGNOSIS — R2689 Other abnormalities of gait and mobility: Secondary | ICD-10-CM | POA: Diagnosis not present

## 2015-01-09 DIAGNOSIS — F0391 Unspecified dementia with behavioral disturbance: Secondary | ICD-10-CM

## 2015-01-09 DIAGNOSIS — F0392 Unspecified dementia, unspecified severity, with psychotic disturbance: Secondary | ICD-10-CM

## 2015-01-09 DIAGNOSIS — Z48812 Encounter for surgical aftercare following surgery on the circulatory system: Secondary | ICD-10-CM | POA: Diagnosis not present

## 2015-01-09 DIAGNOSIS — I509 Heart failure, unspecified: Secondary | ICD-10-CM | POA: Diagnosis not present

## 2015-01-09 DIAGNOSIS — I5022 Chronic systolic (congestive) heart failure: Secondary | ICD-10-CM

## 2015-01-09 DIAGNOSIS — I10 Essential (primary) hypertension: Secondary | ICD-10-CM | POA: Diagnosis not present

## 2015-01-09 NOTE — Telephone Encounter (Signed)
Refax both DME orders to fax # below...Holly Weiss

## 2015-01-09 NOTE — Telephone Encounter (Signed)
Per Venida Jarvis, original orders were not received. Please rewrite order for hospital bed and wheelchair for pt, thanks!

## 2015-01-09 NOTE — Telephone Encounter (Signed)
[facility]olly [facility] with [facility] , called and said that they did not get order for the Hospital bed and a wheelchair.  She needs to know if it can be re faxed over to them    Fax number [phone] Phone -[phone]

## 2015-01-12 ENCOUNTER — Other Ambulatory Visit (HOSPITAL_COMMUNITY): Payer: Self-pay | Admitting: Psychiatry

## 2015-01-14 DIAGNOSIS — I509 Heart failure, unspecified: Secondary | ICD-10-CM | POA: Diagnosis not present

## 2015-01-14 DIAGNOSIS — R2689 Other abnormalities of gait and mobility: Secondary | ICD-10-CM | POA: Diagnosis not present

## 2015-01-14 DIAGNOSIS — E119 Type 2 diabetes mellitus without complications: Secondary | ICD-10-CM | POA: Diagnosis not present

## 2015-01-14 DIAGNOSIS — I10 Essential (primary) hypertension: Secondary | ICD-10-CM | POA: Diagnosis not present

## 2015-01-14 DIAGNOSIS — Z48812 Encounter for surgical aftercare following surgery on the circulatory system: Secondary | ICD-10-CM | POA: Diagnosis not present

## 2015-01-15 NOTE — Telephone Encounter (Signed)
Called patient's [facility] pharmacy to deny patient's requested Donepezil as patient has not been seen since 12/19/13.  Dr. Doyne Keel verified patient would need and appointment for any more refills.

## 2015-01-17 DIAGNOSIS — E119 Type 2 diabetes mellitus without complications: Secondary | ICD-10-CM | POA: Diagnosis not present

## 2015-01-17 DIAGNOSIS — I10 Essential (primary) hypertension: Secondary | ICD-10-CM | POA: Diagnosis not present

## 2015-01-17 DIAGNOSIS — I509 Heart failure, unspecified: Secondary | ICD-10-CM | POA: Diagnosis not present

## 2015-01-17 DIAGNOSIS — Z48812 Encounter for surgical aftercare following surgery on the circulatory system: Secondary | ICD-10-CM | POA: Diagnosis not present

## 2015-01-17 DIAGNOSIS — R2689 Other abnormalities of gait and mobility: Secondary | ICD-10-CM | POA: Diagnosis not present

## 2015-01-22 DIAGNOSIS — G5621 Lesion of ulnar nerve, right upper limb: Secondary | ICD-10-CM | POA: Diagnosis not present

## 2015-01-22 DIAGNOSIS — S82291K Other fracture of shaft of right tibia, subsequent encounter for closed fracture with nonunion: Secondary | ICD-10-CM | POA: Diagnosis not present

## 2015-01-23 DIAGNOSIS — I10 Essential (primary) hypertension: Secondary | ICD-10-CM | POA: Diagnosis not present

## 2015-01-23 DIAGNOSIS — Z48812 Encounter for surgical aftercare following surgery on the circulatory system: Secondary | ICD-10-CM | POA: Diagnosis not present

## 2015-01-23 DIAGNOSIS — R2689 Other abnormalities of gait and mobility: Secondary | ICD-10-CM | POA: Diagnosis not present

## 2015-01-23 DIAGNOSIS — E119 Type 2 diabetes mellitus without complications: Secondary | ICD-10-CM | POA: Diagnosis not present

## 2015-01-23 DIAGNOSIS — I509 Heart failure, unspecified: Secondary | ICD-10-CM | POA: Diagnosis not present

## 2015-01-25 DIAGNOSIS — E119 Type 2 diabetes mellitus without complications: Secondary | ICD-10-CM | POA: Diagnosis not present

## 2015-01-25 DIAGNOSIS — Z48812 Encounter for surgical aftercare following surgery on the circulatory system: Secondary | ICD-10-CM | POA: Diagnosis not present

## 2015-01-25 DIAGNOSIS — I10 Essential (primary) hypertension: Secondary | ICD-10-CM | POA: Diagnosis not present

## 2015-01-25 DIAGNOSIS — I509 Heart failure, unspecified: Secondary | ICD-10-CM | POA: Diagnosis not present

## 2015-01-25 DIAGNOSIS — R2689 Other abnormalities of gait and mobility: Secondary | ICD-10-CM | POA: Diagnosis not present

## 2015-01-26 ENCOUNTER — Other Ambulatory Visit (HOSPITAL_COMMUNITY): Payer: Self-pay | Admitting: Psychiatry

## 2015-01-28 ENCOUNTER — Telehealth (HOSPITAL_COMMUNITY): Payer: Self-pay | Admitting: *Deleted

## 2015-01-28 ENCOUNTER — Telehealth: Payer: Self-pay | Admitting: Internal Medicine

## 2015-01-28 NOTE — Telephone Encounter (Signed)
Patient has an appointment on Wednesday. Please call [facility] at [phone]. She wants to talk with you about her medications and hospital bed

## 2015-01-28 NOTE — Telephone Encounter (Signed)
Called for refills of medication of Olanzapine and Aricept. Explained that the patient had not been seen since 2015 and has no future appointment scheduled. Unable to refill prescriptions and told they must follow up with their PCP or new mental health provider.

## 2015-01-29 ENCOUNTER — Telehealth: Payer: Self-pay | Admitting: Internal Medicine

## 2015-01-29 NOTE — Telephone Encounter (Signed)
Pt has appt tomorrow 06/28, okay to discuss at [place]? Orders for hospital bed has been faxed with confirmation of receipt from [facility] several times

## 2015-01-29 NOTE — Telephone Encounter (Signed)
Ok with me 

## 2015-01-29 NOTE — Telephone Encounter (Signed)
staci with [facility] called regarding a prescription for a wheelchair and a hospital bed. Please fax the order to [phone].  They have been asking for these for a while now

## 2015-01-29 NOTE — Telephone Encounter (Signed)
[name] has been informed

## 2015-01-29 NOTE — Telephone Encounter (Signed)
Please advise [facility] that these orders have been faxed several time. Written orders will be given to patient at [place] 01/30/2015

## 2015-01-30 ENCOUNTER — Ambulatory Visit (INDEPENDENT_AMBULATORY_CARE_PROVIDER_SITE_OTHER): Payer: Medicare Other | Admitting: Internal Medicine

## 2015-01-30 ENCOUNTER — Other Ambulatory Visit: Payer: Self-pay | Admitting: Internal Medicine

## 2015-01-30 ENCOUNTER — Other Ambulatory Visit (INDEPENDENT_AMBULATORY_CARE_PROVIDER_SITE_OTHER): Payer: Medicare Other

## 2015-01-30 ENCOUNTER — Encounter: Payer: Self-pay | Admitting: Internal Medicine

## 2015-01-30 VITALS — BP 142/86 | HR 75 | Temp 97.7°F

## 2015-01-30 DIAGNOSIS — E119 Type 2 diabetes mellitus without complications: Secondary | ICD-10-CM

## 2015-01-30 DIAGNOSIS — E785 Hyperlipidemia, unspecified: Secondary | ICD-10-CM

## 2015-01-30 DIAGNOSIS — Z0000 Encounter for general adult medical examination without abnormal findings: Secondary | ICD-10-CM

## 2015-01-30 DIAGNOSIS — I10 Essential (primary) hypertension: Secondary | ICD-10-CM

## 2015-01-30 LAB — CBC WITH DIFFERENTIAL/PLATELET
BASOS ABS: 0 10*3/uL (ref 0.0–0.1)
BASOS PCT: 0.4 % (ref 0.0–3.0)
Eosinophils Absolute: 0.3 10*3/uL (ref 0.0–0.7)
Eosinophils Relative: 6.9 % — ABNORMAL HIGH (ref 0.0–5.0)
HCT: 33.2 % — ABNORMAL LOW (ref 36.0–46.0)
HEMOGLOBIN: 10.9 g/dL — AB (ref 12.0–15.0)
LYMPHS PCT: 29.4 % (ref 12.0–46.0)
Lymphs Abs: 1.4 10*3/uL (ref 0.7–4.0)
MCHC: 32.8 g/dL (ref 30.0–36.0)
MCV: 87.9 fl (ref 78.0–100.0)
MONO ABS: 0.4 10*3/uL (ref 0.1–1.0)
MONOS PCT: 9.3 % (ref 3.0–12.0)
Neutro Abs: 2.5 10*3/uL (ref 1.4–7.7)
Neutrophils Relative %: 54 % (ref 43.0–77.0)
Platelets: 172 10*3/uL (ref 150.0–400.0)
RBC: 3.78 Mil/uL — AB (ref 3.87–5.11)
RDW: 18.1 % — ABNORMAL HIGH (ref 11.5–15.5)
WBC: 4.6 10*3/uL (ref 4.0–10.5)

## 2015-01-30 LAB — HEPATIC FUNCTION PANEL
ALT: 13 U/L (ref 0–35)
AST: 16 U/L (ref 0–37)
Albumin: 3.8 g/dL (ref 3.5–5.2)
Alkaline Phosphatase: 113 U/L (ref 39–117)
BILIRUBIN TOTAL: 0.3 mg/dL (ref 0.2–1.2)
Bilirubin, Direct: 0 mg/dL (ref 0.0–0.3)
Total Protein: 7.4 g/dL (ref 6.0–8.3)

## 2015-01-30 LAB — HEMOGLOBIN A1C: Hgb A1c MFr Bld: 6.4 % (ref 4.6–6.5)

## 2015-01-30 LAB — TSH: TSH: 1.36 u[IU]/mL (ref 0.35–4.50)

## 2015-01-30 LAB — LIPID PANEL
Cholesterol: 155 mg/dL (ref 0–200)
HDL: 45.8 mg/dL (ref >39.00)
LDL CALC: 88 mg/dL (ref 0–99)
NonHDL: 109.2
Total CHOL/HDL Ratio: 3
Triglycerides: 107 mg/dL (ref 0.0–149.0)
VLDL: 21.4 mg/dL (ref 0.0–40.0)

## 2015-01-30 LAB — BASIC METABOLIC PANEL
BUN: 24 mg/dL — ABNORMAL HIGH (ref 6–23)
CO2: 27 mEq/L (ref 19–32)
CREATININE: 0.85 mg/dL (ref 0.40–1.20)
Calcium: 10 mg/dL (ref 8.4–10.5)
Chloride: 109 mEq/L (ref 96–112)
GFR: 84.05 mL/min (ref >60.00)
Glucose, Bld: 72 mg/dL (ref 70–99)
POTASSIUM: 4.3 meq/L (ref 3.5–5.1)
SODIUM: 143 meq/L (ref 135–145)

## 2015-01-30 MED ORDER — GLUCOSE BLOOD VI STRP: ORAL_STRIP | Status: DC

## 2015-01-30 MED ORDER — ONETOUCH DELICA LANCETS 33G MISC: Status: DC

## 2015-01-30 NOTE — Telephone Encounter (Signed)
zyprexa and aricept rx sent to pharm

## 2015-01-30 NOTE — Progress Notes (Signed)
Subjective:    Patient ID: Holly Weiss, female    DOB: 1941-05-05, 74 y.o.   MRN: [id number]  HPI  Here for wellness and f/u;  Overall doing ok;  Pt denies Chest pain, worsening SOB, DOE, wheezing, orthopnea, PND, worsening LE edema, palpitations, dizziness or syncope.  Pt denies neurological change such as new headache, facial or extremity weakness.  Pt denies polydipsia, polyuria, or low sugar symptoms. Pt states overall good compliance with treatment and medications, good tolerability, and has been trying to follow appropriate diet.  Pt denies worsening depressive symptoms, suicidal ideation or panic. No fever, night sweats, wt loss, loss of appetite, or other constitutional symptoms.  Pt states good ability with ADL's, has low fall risk, home safety reviewed and adequate, no other significant changes in hearing or vision, though plans to see optho in [place] where she now lives.  Cannot walk due to right leg tib.fib fx non healing. She is hoping for surgury, but must be cleared for card first and has this planned visit upcoming. Past Medical History  Diagnosis Date  . ANEMIA-NOS 01/18/2008  . 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 ASTHMA, WITH ACUTE EXACERBATION 11/29/2008  . Chronic pain syndrome 01/21/2010  . DIABETIC  RETINOPATHY 05/30/2007  . DM, UNCOMPLICATED, TYPE II, UNCONTROLLED 05/30/2007  . 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 HYPERLIPIDEMIA 05/30/2007  . HYPERTENSION 10/31/2007  . LOW BACK PAIN 01/18/2008  . MORBID OBESITY, HX OF 05/30/2007  . NEPHROLITHIASIS, HX OF 01/18/2008  . SHOULDER PAIN, BILATERAL 04/09/2009  . Migraine headache   . Spinal stenosis   . Bilateral carpal tunnel syndrome   . Shortness of breath     with walking  . Urgency of urination   . Arthritis   . Edema of both legs     takes Lasix  . Numbness and tingling in hands   . GERD (gastroesophageal reflux disease)   . Femur fracture, right   . Seasonal allergies   . History of kidney stones   . Depression   .  History of blood transfusion     spine surgery   Past Surgical History  Procedure Laterality Date  . Cataract extraction    . Cholecystectomy    . Thoracic fusion    . Lumbar disc surgury    . Tubal ligation    . Back surgery    . Total knee arthroplasty with revision components Right 07/12/2013    Procedure: TOTAL KNEE ARTHROPLASTY WITH TIBIA REVISION COMPONENTS;  Surgeon: Ninetta Lights, MD;  Location: [facility];  Service: Orthopedics;  Laterality: Right;  . Orif tibia fracture Right 01/16/2014    Procedure: RIGHT TIBIA REPAIR NON-UNION/MALUNION TIBIA WITH SLIDING GRAFT;  Surgeon: Renette Butters, MD;  Location: [facility];  Service: Orthopedics;  Laterality: Right;    reports that she has never smoked. She does not have any smokeless tobacco history on file. She reports that she does not drink alcohol or use illicit drugs. family history includes Dementia in her mother; Depression in her sister; Diabetes in her mother and sister; Heart disease in her father and mother; Lung cancer in her father. Allergies  Allergen Reactions  . Adhesive [Tape] Itching  . Endocet [Oxycodone-Acetaminophen] Itching  . Latex Itching   Current Outpatient Prescriptions on File Prior to Visit  Medication Sig Dispense Refill  . albuterol (VENTOLIN HFA) 108 (90 BASE) MCG/ACT inhaler Inhale 2 puffs into the lungs 2 (two) times daily as needed for wheezing or shortness of breath. 1  Inhaler 11  . aspirin 81 MG tablet Take 1 tablet (81 mg total) by mouth daily. 30 tablet 0  . B-D ULTRAFINE III SHORT PEN 31G X 8 MM MISC USE AS DIRECTED TWICE A DAY 200 each 3  . B-D ULTRAFINE III SHORT PEN 31G X 8 MM MISC USE AS DIRECTED TWICE A DAY 200 each 3  . Blood Glucose Monitoring Suppl ([place]) W/DEVICE KIT Use as directed once daily to check blood sugar.  Diagnosis code 250.02 1 each 0  . Calcium Carbonate-Vitamin D (CALTRATE 600+D PO) Take 2 tablets by mouth daily.    . furosemide (LASIX) 40 MG tablet Take 20 mg  by mouth daily.    . insulin glargine (LANTUS) 100 UNIT/ML injection Inject 40 Units into the skin every morning.     . Insulin Pen Needle (B-D ULTRAFINE III SHORT PEN) 31G X 8 MM MISC Use to check blood sugars twice a day Dx E11.9 200 each 3  . Multiple Vitamins-Minerals (ONE-A-DAY EXTRAS ANTIOXIDANT PO) Take 1 tablet by mouth daily.     Marland Kitchen NOVOLOG FLEXPEN 100 UNIT/ML FlexPen INJECT 15 UNITS UNDER THE SKIN EVERY DAY 15 mL 0  . rosuvastatin (CRESTOR) 20 MG tablet Take 10 mg by mouth daily.    Marland Kitchen donepezil (ARICEPT) 5 MG tablet Take 2.5 mg by mouth at bedtime.    . Magnesium 250 MG TABS Take 250 mg by mouth daily.    Marland Kitchen OLANZapine (ZYPREXA) 2.5 MG tablet Take 1 tablet (2.5 mg total) by mouth at bedtime. 30 tablet 2  . ONE TOUCH LANCETS MISC Use as directed once daily to check blood sugar.  Diagnosis code 250.02 (Patient not taking: Reported on 08/01/2014) 200 each 11  . oxyCODONE (ROXICODONE) 5 MG immediate release tablet Take 1 tablet (5 mg total) by mouth 3 (three) times daily as needed for severe pain. (Patient not taking: Reported on 01/30/2015) 90 tablet 0  . tiZANidine (ZANAFLEX) 4 MG tablet Take 1 tablet (4 mg total) by mouth every 6 (six) hours as needed for muscle spasms. (Patient not taking: Reported on 01/30/2015) 30 tablet 2   No current facility-administered medications on file prior to visit.      Review of Systems Constitutional: Negative for increased diaphoresis, other activity, appetite or siginficant weight change other than noted HENT: Negative for worsening hearing loss, ear pain, facial swelling, mouth sores and neck stiffness.   Eyes: Negative for other worsening pain, redness or visual disturbance.  Respiratory: Negative for shortness of breath and wheezing  Cardiovascular: Negative for chest pain and palpitations.  Gastrointestinal: Negative for diarrhea, blood in stool, abdominal distention or other pain Genitourinary: Negative for hematuria, flank pain or change in urine  volume.  Musculoskeletal: Negative for myalgias or other joint complaints.  Skin: Negative for color change and wound or drainage.  Neurological: Negative for syncope and numbness. other than noted Hematological: Negative for adenopathy. or other swelling Psychiatric/Behavioral: Negative for hallucinations, SI, self-injury, decreased concentration or other worsening agitation.      Objective:   Physical Exam BP 142/86 mmHg  Pulse 75  Temp(Src) 97.7 F (36.5 C) (Oral)  SpO2 97% VS noted, in wheelchair Constitutional: Pt is oriented to person, place, and time. Appears well-developed and well-nourished, in no significant distress/ obese Head: Normocephalic and atraumatic.  Right Ear: External ear normal.  Left Ear: External ear normal.  Nose: Nose normal.  Mouth/Throat: Oropharynx is clear and moist.  Eyes: Conjunctivae and EOM are normal. Pupils are  equal, round, and reactive to light.  Neck: Normal range of motion. Neck supple. No JVD present. No tracheal deviation present or significant neck LA or mass Cardiovascular: Normal rate, regular rhythm, normal heart sounds and intact distal pulses.   Pulmonary/Chest: Effort normal and breath sounds without rales or wheezing  Abdominal: Soft. Bowel sounds are normal. NT. No HSM  Musculoskeletal: Normal range of motion. Exhibits no edema.  Lymphadenopathy:  Has no cervical adenopathy.  Neurological: Pt is alert and oriented to person, place, and time. Pt has normal reflexes. No cranial nerve deficit. Motor grossly intact, has + right ulnar neuoropathy Skin: Skin is warm and dry. No rash noted.  Psychiatric:  Has normal mood and affect. Behavior is normal.   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

## 2015-01-30 NOTE — Progress Notes (Signed)
Pre visit review using our clinic review tool, if applicable. No additional management support is needed unless otherwise documented below in the visit note. 

## 2015-01-30 NOTE — Patient Instructions (Signed)

## 2015-01-30 NOTE — Assessment & Plan Note (Signed)
stable overall by history and exam, recent data reviewed with pt, and pt to continue medical treatment as before,  to f/u any worsening symptoms or concerns Lab Results  Component Value Date   HGBA1C 7.0* 07/20/2014    for f;u labs

## 2015-01-30 NOTE — Assessment & Plan Note (Signed)

## 2015-01-31 DIAGNOSIS — I509 Heart failure, unspecified: Secondary | ICD-10-CM | POA: Diagnosis not present

## 2015-01-31 DIAGNOSIS — E119 Type 2 diabetes mellitus without complications: Secondary | ICD-10-CM | POA: Diagnosis not present

## 2015-01-31 DIAGNOSIS — Z48812 Encounter for surgical aftercare following surgery on the circulatory system: Secondary | ICD-10-CM | POA: Diagnosis not present

## 2015-01-31 DIAGNOSIS — I10 Essential (primary) hypertension: Secondary | ICD-10-CM | POA: Diagnosis not present

## 2015-01-31 DIAGNOSIS — R2689 Other abnormalities of gait and mobility: Secondary | ICD-10-CM | POA: Diagnosis not present

## 2015-02-01 ENCOUNTER — Encounter: Payer: Self-pay | Admitting: Internal Medicine

## 2015-02-01 DIAGNOSIS — Z48812 Encounter for surgical aftercare following surgery on the circulatory system: Secondary | ICD-10-CM | POA: Diagnosis not present

## 2015-02-01 DIAGNOSIS — R2689 Other abnormalities of gait and mobility: Secondary | ICD-10-CM | POA: Diagnosis not present

## 2015-02-01 DIAGNOSIS — E119 Type 2 diabetes mellitus without complications: Secondary | ICD-10-CM | POA: Diagnosis not present

## 2015-02-01 DIAGNOSIS — I10 Essential (primary) hypertension: Secondary | ICD-10-CM | POA: Diagnosis not present

## 2015-02-01 DIAGNOSIS — I509 Heart failure, unspecified: Secondary | ICD-10-CM | POA: Diagnosis not present

## 2015-02-06 ENCOUNTER — Other Ambulatory Visit: Payer: Self-pay

## 2015-02-06 ENCOUNTER — Other Ambulatory Visit: Payer: Self-pay | Admitting: Internal Medicine

## 2015-02-07 DIAGNOSIS — I509 Heart failure, unspecified: Secondary | ICD-10-CM | POA: Diagnosis not present

## 2015-02-07 DIAGNOSIS — I10 Essential (primary) hypertension: Secondary | ICD-10-CM | POA: Diagnosis not present

## 2015-02-07 DIAGNOSIS — E119 Type 2 diabetes mellitus without complications: Secondary | ICD-10-CM | POA: Diagnosis not present

## 2015-02-07 DIAGNOSIS — R2689 Other abnormalities of gait and mobility: Secondary | ICD-10-CM | POA: Diagnosis not present

## 2015-02-07 DIAGNOSIS — Z48812 Encounter for surgical aftercare following surgery on the circulatory system: Secondary | ICD-10-CM | POA: Diagnosis not present

## 2015-02-12 DIAGNOSIS — E785 Hyperlipidemia, unspecified: Secondary | ICD-10-CM | POA: Diagnosis not present

## 2015-02-12 DIAGNOSIS — I10 Essential (primary) hypertension: Secondary | ICD-10-CM | POA: Diagnosis not present

## 2015-02-12 DIAGNOSIS — I2510 Atherosclerotic heart disease of native coronary artery without angina pectoris: Secondary | ICD-10-CM | POA: Diagnosis not present

## 2015-02-12 DIAGNOSIS — Z794 Long term (current) use of insulin: Secondary | ICD-10-CM | POA: Diagnosis not present

## 2015-02-12 DIAGNOSIS — E119 Type 2 diabetes mellitus without complications: Secondary | ICD-10-CM | POA: Diagnosis not present

## 2015-02-12 DIAGNOSIS — I255 Ischemic cardiomyopathy: Secondary | ICD-10-CM | POA: Diagnosis not present

## 2015-02-12 DIAGNOSIS — R600 Localized edema: Secondary | ICD-10-CM | POA: Diagnosis not present

## 2015-02-12 DIAGNOSIS — Z951 Presence of aortocoronary bypass graft: Secondary | ICD-10-CM | POA: Diagnosis not present

## 2015-02-12 DIAGNOSIS — I509 Heart failure, unspecified: Secondary | ICD-10-CM | POA: Diagnosis not present

## 2015-02-12 DIAGNOSIS — S82202K Unspecified fracture of shaft of left tibia, subsequent encounter for closed fracture with nonunion: Secondary | ICD-10-CM | POA: Diagnosis not present

## 2015-02-12 DIAGNOSIS — I5023 Acute on chronic systolic (congestive) heart failure: Secondary | ICD-10-CM | POA: Diagnosis not present

## 2015-02-12 DIAGNOSIS — I259 Chronic ischemic heart disease, unspecified: Secondary | ICD-10-CM | POA: Diagnosis not present

## 2015-02-13 DIAGNOSIS — R2689 Other abnormalities of gait and mobility: Secondary | ICD-10-CM | POA: Diagnosis not present

## 2015-02-13 DIAGNOSIS — I509 Heart failure, unspecified: Secondary | ICD-10-CM | POA: Diagnosis not present

## 2015-02-13 DIAGNOSIS — I10 Essential (primary) hypertension: Secondary | ICD-10-CM | POA: Diagnosis not present

## 2015-02-13 DIAGNOSIS — Z48812 Encounter for surgical aftercare following surgery on the circulatory system: Secondary | ICD-10-CM | POA: Diagnosis not present

## 2015-02-13 DIAGNOSIS — E119 Type 2 diabetes mellitus without complications: Secondary | ICD-10-CM | POA: Diagnosis not present

## 2015-02-18 DIAGNOSIS — H4323 Crystalline deposits in vitreous body, bilateral: Secondary | ICD-10-CM | POA: Diagnosis not present

## 2015-02-18 DIAGNOSIS — E11329 Type 2 diabetes mellitus with mild nonproliferative diabetic retinopathy without macular edema: Secondary | ICD-10-CM | POA: Diagnosis not present

## 2015-02-19 DIAGNOSIS — S82291K Other fracture of shaft of right tibia, subsequent encounter for closed fracture with nonunion: Secondary | ICD-10-CM | POA: Diagnosis not present

## 2015-02-19 DIAGNOSIS — E119 Type 2 diabetes mellitus without complications: Secondary | ICD-10-CM | POA: Diagnosis not present

## 2015-02-19 DIAGNOSIS — M810 Age-related osteoporosis without current pathological fracture: Secondary | ICD-10-CM | POA: Diagnosis not present

## 2015-02-19 DIAGNOSIS — Z96651 Presence of right artificial knee joint: Secondary | ICD-10-CM | POA: Diagnosis not present

## 2015-02-19 DIAGNOSIS — Z9181 History of falling: Secondary | ICD-10-CM | POA: Diagnosis not present

## 2015-02-19 DIAGNOSIS — S82201G Unspecified fracture of shaft of right tibia, subsequent encounter for closed fracture with delayed healing: Secondary | ICD-10-CM | POA: Diagnosis not present

## 2015-02-20 DIAGNOSIS — R2689 Other abnormalities of gait and mobility: Secondary | ICD-10-CM | POA: Diagnosis not present

## 2015-02-20 DIAGNOSIS — E119 Type 2 diabetes mellitus without complications: Secondary | ICD-10-CM | POA: Diagnosis not present

## 2015-02-20 DIAGNOSIS — I10 Essential (primary) hypertension: Secondary | ICD-10-CM | POA: Diagnosis not present

## 2015-02-20 DIAGNOSIS — Z48812 Encounter for surgical aftercare following surgery on the circulatory system: Secondary | ICD-10-CM | POA: Diagnosis not present

## 2015-02-20 DIAGNOSIS — I509 Heart failure, unspecified: Secondary | ICD-10-CM | POA: Diagnosis not present

## 2015-02-21 DIAGNOSIS — S82291K Other fracture of shaft of right tibia, subsequent encounter for closed fracture with nonunion: Secondary | ICD-10-CM | POA: Diagnosis not present

## 2015-02-21 DIAGNOSIS — G5621 Lesion of ulnar nerve, right upper limb: Secondary | ICD-10-CM | POA: Diagnosis not present

## 2015-02-21 DIAGNOSIS — Z48812 Encounter for surgical aftercare following surgery on the circulatory system: Secondary | ICD-10-CM | POA: Diagnosis not present

## 2015-02-22 DIAGNOSIS — I509 Heart failure, unspecified: Secondary | ICD-10-CM | POA: Diagnosis not present

## 2015-02-22 DIAGNOSIS — E119 Type 2 diabetes mellitus without complications: Secondary | ICD-10-CM | POA: Diagnosis not present

## 2015-02-22 DIAGNOSIS — R2689 Other abnormalities of gait and mobility: Secondary | ICD-10-CM | POA: Diagnosis not present

## 2015-02-22 DIAGNOSIS — Z48812 Encounter for surgical aftercare following surgery on the circulatory system: Secondary | ICD-10-CM | POA: Diagnosis not present

## 2015-02-22 DIAGNOSIS — I10 Essential (primary) hypertension: Secondary | ICD-10-CM | POA: Diagnosis not present

## 2015-02-25 DIAGNOSIS — Z48812 Encounter for surgical aftercare following surgery on the circulatory system: Secondary | ICD-10-CM | POA: Diagnosis not present

## 2015-02-25 DIAGNOSIS — R2689 Other abnormalities of gait and mobility: Secondary | ICD-10-CM | POA: Diagnosis not present

## 2015-02-25 DIAGNOSIS — I509 Heart failure, unspecified: Secondary | ICD-10-CM | POA: Diagnosis not present

## 2015-02-25 DIAGNOSIS — E119 Type 2 diabetes mellitus without complications: Secondary | ICD-10-CM | POA: Diagnosis not present

## 2015-02-25 DIAGNOSIS — I10 Essential (primary) hypertension: Secondary | ICD-10-CM | POA: Diagnosis not present

## 2015-02-27 DIAGNOSIS — R2689 Other abnormalities of gait and mobility: Secondary | ICD-10-CM | POA: Diagnosis not present

## 2015-02-27 DIAGNOSIS — Z48812 Encounter for surgical aftercare following surgery on the circulatory system: Secondary | ICD-10-CM | POA: Diagnosis not present

## 2015-02-27 DIAGNOSIS — E119 Type 2 diabetes mellitus without complications: Secondary | ICD-10-CM | POA: Diagnosis not present

## 2015-02-27 DIAGNOSIS — I509 Heart failure, unspecified: Secondary | ICD-10-CM | POA: Diagnosis not present

## 2015-02-27 DIAGNOSIS — I10 Essential (primary) hypertension: Secondary | ICD-10-CM | POA: Diagnosis not present

## 2015-03-08 DIAGNOSIS — E119 Type 2 diabetes mellitus without complications: Secondary | ICD-10-CM | POA: Diagnosis not present

## 2015-03-08 DIAGNOSIS — Z48812 Encounter for surgical aftercare following surgery on the circulatory system: Secondary | ICD-10-CM | POA: Diagnosis not present

## 2015-03-08 DIAGNOSIS — I10 Essential (primary) hypertension: Secondary | ICD-10-CM | POA: Diagnosis not present

## 2015-03-08 DIAGNOSIS — R2689 Other abnormalities of gait and mobility: Secondary | ICD-10-CM | POA: Diagnosis not present

## 2015-03-08 DIAGNOSIS — I509 Heart failure, unspecified: Secondary | ICD-10-CM | POA: Diagnosis not present

## 2015-03-11 DIAGNOSIS — R2689 Other abnormalities of gait and mobility: Secondary | ICD-10-CM | POA: Diagnosis not present

## 2015-03-11 DIAGNOSIS — E119 Type 2 diabetes mellitus without complications: Secondary | ICD-10-CM | POA: Diagnosis not present

## 2015-03-11 DIAGNOSIS — Z48812 Encounter for surgical aftercare following surgery on the circulatory system: Secondary | ICD-10-CM | POA: Diagnosis not present

## 2015-03-11 DIAGNOSIS — I10 Essential (primary) hypertension: Secondary | ICD-10-CM | POA: Diagnosis not present

## 2015-03-11 DIAGNOSIS — I509 Heart failure, unspecified: Secondary | ICD-10-CM | POA: Diagnosis not present

## 2015-03-12 DIAGNOSIS — I259 Chronic ischemic heart disease, unspecified: Secondary | ICD-10-CM | POA: Diagnosis not present

## 2015-03-12 DIAGNOSIS — Z79899 Other long term (current) drug therapy: Secondary | ICD-10-CM | POA: Diagnosis not present

## 2015-03-12 DIAGNOSIS — Z794 Long term (current) use of insulin: Secondary | ICD-10-CM | POA: Diagnosis not present

## 2015-03-12 DIAGNOSIS — Z6837 Body mass index (BMI) 37.0-37.9, adult: Secondary | ICD-10-CM | POA: Diagnosis not present

## 2015-03-12 DIAGNOSIS — Z79891 Long term (current) use of opiate analgesic: Secondary | ICD-10-CM | POA: Diagnosis not present

## 2015-03-12 DIAGNOSIS — I5020 Unspecified systolic (congestive) heart failure: Secondary | ICD-10-CM | POA: Diagnosis not present

## 2015-03-12 DIAGNOSIS — Z7982 Long term (current) use of aspirin: Secondary | ICD-10-CM | POA: Diagnosis not present

## 2015-03-12 DIAGNOSIS — E8770 Fluid overload, unspecified: Secondary | ICD-10-CM | POA: Diagnosis not present

## 2015-03-12 DIAGNOSIS — E119 Type 2 diabetes mellitus without complications: Secondary | ICD-10-CM | POA: Diagnosis not present

## 2015-03-12 DIAGNOSIS — Z951 Presence of aortocoronary bypass graft: Secondary | ICD-10-CM | POA: Diagnosis not present

## 2015-03-12 DIAGNOSIS — I2510 Atherosclerotic heart disease of native coronary artery without angina pectoris: Secondary | ICD-10-CM | POA: Diagnosis not present

## 2015-03-12 DIAGNOSIS — I255 Ischemic cardiomyopathy: Secondary | ICD-10-CM | POA: Diagnosis not present

## 2015-03-21 ENCOUNTER — Other Ambulatory Visit: Payer: Self-pay | Admitting: Internal Medicine

## 2015-03-24 DIAGNOSIS — G5621 Lesion of ulnar nerve, right upper limb: Secondary | ICD-10-CM | POA: Diagnosis not present

## 2015-03-24 DIAGNOSIS — Z48812 Encounter for surgical aftercare following surgery on the circulatory system: Secondary | ICD-10-CM | POA: Diagnosis not present

## 2015-03-24 DIAGNOSIS — S82291K Other fracture of shaft of right tibia, subsequent encounter for closed fracture with nonunion: Secondary | ICD-10-CM | POA: Diagnosis not present

## 2015-03-31 ENCOUNTER — Other Ambulatory Visit: Payer: Self-pay | Admitting: Internal Medicine

## 2015-04-02 DIAGNOSIS — E119 Type 2 diabetes mellitus without complications: Secondary | ICD-10-CM | POA: Diagnosis not present

## 2015-04-02 DIAGNOSIS — S82201K Unspecified fracture of shaft of right tibia, subsequent encounter for closed fracture with nonunion: Secondary | ICD-10-CM | POA: Diagnosis not present

## 2015-04-02 DIAGNOSIS — Z91040 Latex allergy status: Secondary | ICD-10-CM | POA: Diagnosis not present

## 2015-04-02 DIAGNOSIS — Z9181 History of falling: Secondary | ICD-10-CM | POA: Diagnosis not present

## 2015-04-02 DIAGNOSIS — S82201G Unspecified fracture of shaft of right tibia, subsequent encounter for closed fracture with delayed healing: Secondary | ICD-10-CM | POA: Diagnosis not present

## 2015-04-02 DIAGNOSIS — Z794 Long term (current) use of insulin: Secondary | ICD-10-CM | POA: Diagnosis not present

## 2015-04-02 DIAGNOSIS — T84042D Periprosthetic fracture around internal prosthetic right knee joint, subsequent encounter: Secondary | ICD-10-CM | POA: Diagnosis not present

## 2015-04-02 DIAGNOSIS — M810 Age-related osteoporosis without current pathological fracture: Secondary | ICD-10-CM | POA: Diagnosis not present

## 2015-04-02 DIAGNOSIS — S82401K Unspecified fracture of shaft of right fibula, subsequent encounter for closed fracture with nonunion: Secondary | ICD-10-CM | POA: Diagnosis not present

## 2015-04-02 DIAGNOSIS — Z7982 Long term (current) use of aspirin: Secondary | ICD-10-CM | POA: Diagnosis not present

## 2015-04-02 DIAGNOSIS — Z885 Allergy status to narcotic agent status: Secondary | ICD-10-CM | POA: Diagnosis not present

## 2015-04-18 DIAGNOSIS — S82209J Unspecified fracture of shaft of unspecified tibia, subsequent encounter for open fracture type IIIA, IIIB, or IIIC with delayed healing: Secondary | ICD-10-CM | POA: Diagnosis not present

## 2015-04-18 DIAGNOSIS — Z01818 Encounter for other preprocedural examination: Secondary | ICD-10-CM | POA: Diagnosis not present

## 2015-04-18 DIAGNOSIS — Z01812 Encounter for preprocedural laboratory examination: Secondary | ICD-10-CM | POA: Diagnosis not present

## 2015-04-24 DIAGNOSIS — G5621 Lesion of ulnar nerve, right upper limb: Secondary | ICD-10-CM | POA: Diagnosis not present

## 2015-04-24 DIAGNOSIS — S82291K Other fracture of shaft of right tibia, subsequent encounter for closed fracture with nonunion: Secondary | ICD-10-CM | POA: Diagnosis not present

## 2015-04-24 DIAGNOSIS — Z48812 Encounter for surgical aftercare following surgery on the circulatory system: Secondary | ICD-10-CM | POA: Diagnosis not present

## 2015-04-25 ENCOUNTER — Other Ambulatory Visit: Payer: Self-pay | Admitting: Internal Medicine

## 2015-04-25 MED ORDER — INSULIN ASPART 100 UNIT/ML FLEXPEN: PEN_INJECTOR | SUBCUTANEOUS | Status: DC

## 2015-04-25 MED ORDER — FUROSEMIDE 40 MG PO TABS: 20.0000 mg | ORAL_TABLET | Freq: Every day | ORAL | Status: DC

## 2015-04-25 MED ORDER — ALBUTEROL SULFATE HFA 108 (90 BASE) MCG/ACT IN AERS: 2.0000 | INHALATION_SPRAY | Freq: Two times a day (BID) | RESPIRATORY_TRACT | Status: DC | PRN

## 2015-04-25 MED ORDER — OXYCODONE HCL 5 MG PO TABS: 5.0000 mg | ORAL_TABLET | Freq: Three times a day (TID) | ORAL | Status: DC | PRN

## 2015-04-25 MED ORDER — ROSUVASTATIN CALCIUM 20 MG PO TABS: 10.0000 mg | ORAL_TABLET | Freq: Every day | ORAL | Status: DC

## 2015-04-25 NOTE — Telephone Encounter (Signed)
Oxycodone Done hardcopy to [facility]

## 2015-04-25 NOTE — Telephone Encounter (Signed)
Patient called requesting refills for albuterol (VENTOLIN HFA) 108 (90 BASE) MCG/ACT inhaler [[id number]]  furosemide (LASIX) 40 MG tablet [[id number] rosuvastatin (CRESTOR) 20 MG tablet [[id number] oxyCODONE (ROXICODONE) 5 MG immediate release tablet [[id number]]  NOVOLOG FLEXPEN 100 UNIT/ML FlexPen [[id number]]  Pharmacy is [facility] on [address]

## 2015-04-26 ENCOUNTER — Other Ambulatory Visit: Payer: Self-pay | Admitting: Internal Medicine

## 2015-04-26 NOTE — Telephone Encounter (Signed)
Pt informed via VM, Rx in cabinet for pt pick up  

## 2015-05-01 DIAGNOSIS — Z91040 Latex allergy status: Secondary | ICD-10-CM | POA: Diagnosis not present

## 2015-05-01 DIAGNOSIS — E119 Type 2 diabetes mellitus without complications: Secondary | ICD-10-CM | POA: Diagnosis not present

## 2015-05-01 DIAGNOSIS — S82201G Unspecified fracture of shaft of right tibia, subsequent encounter for closed fracture with delayed healing: Secondary | ICD-10-CM | POA: Diagnosis not present

## 2015-05-01 DIAGNOSIS — S82201K Unspecified fracture of shaft of right tibia, subsequent encounter for closed fracture with nonunion: Secondary | ICD-10-CM | POA: Diagnosis not present

## 2015-05-01 DIAGNOSIS — S82201P Unspecified fracture of shaft of right tibia, subsequent encounter for closed fracture with malunion: Secondary | ICD-10-CM | POA: Diagnosis not present

## 2015-05-01 DIAGNOSIS — Z885 Allergy status to narcotic agent status: Secondary | ICD-10-CM | POA: Diagnosis not present

## 2015-05-01 DIAGNOSIS — Z794 Long term (current) use of insulin: Secondary | ICD-10-CM | POA: Diagnosis not present

## 2015-05-01 DIAGNOSIS — Z833 Family history of diabetes mellitus: Secondary | ICD-10-CM | POA: Diagnosis not present

## 2015-05-01 DIAGNOSIS — E785 Hyperlipidemia, unspecified: Secondary | ICD-10-CM | POA: Diagnosis not present

## 2015-05-01 DIAGNOSIS — Z96651 Presence of right artificial knee joint: Secondary | ICD-10-CM | POA: Diagnosis not present

## 2015-05-01 DIAGNOSIS — Z8249 Family history of ischemic heart disease and other diseases of the circulatory system: Secondary | ICD-10-CM | POA: Diagnosis not present

## 2015-05-01 DIAGNOSIS — E875 Hyperkalemia: Secondary | ICD-10-CM | POA: Diagnosis not present

## 2015-05-01 DIAGNOSIS — J45909 Unspecified asthma, uncomplicated: Secondary | ICD-10-CM | POA: Diagnosis not present

## 2015-05-01 DIAGNOSIS — E860 Dehydration: Secondary | ICD-10-CM | POA: Diagnosis not present

## 2015-05-01 DIAGNOSIS — Z7982 Long term (current) use of aspirin: Secondary | ICD-10-CM | POA: Diagnosis not present

## 2015-05-01 DIAGNOSIS — I5022 Chronic systolic (congestive) heart failure: Secondary | ICD-10-CM | POA: Diagnosis not present

## 2015-05-01 DIAGNOSIS — D62 Acute posthemorrhagic anemia: Secondary | ICD-10-CM | POA: Diagnosis not present

## 2015-05-01 DIAGNOSIS — I5020 Unspecified systolic (congestive) heart failure: Secondary | ICD-10-CM | POA: Diagnosis not present

## 2015-05-01 DIAGNOSIS — D696 Thrombocytopenia, unspecified: Secondary | ICD-10-CM | POA: Diagnosis not present

## 2015-05-01 DIAGNOSIS — S82201A Unspecified fracture of shaft of right tibia, initial encounter for closed fracture: Secondary | ICD-10-CM | POA: Diagnosis not present

## 2015-05-01 DIAGNOSIS — F329 Major depressive disorder, single episode, unspecified: Secondary | ICD-10-CM | POA: Diagnosis not present

## 2015-05-01 DIAGNOSIS — Z951 Presence of aortocoronary bypass graft: Secondary | ICD-10-CM | POA: Diagnosis not present

## 2015-05-01 DIAGNOSIS — M96671 Fracture of tibia or fibula following insertion of orthopedic implant, joint prosthesis, or bone plate, right leg: Secondary | ICD-10-CM | POA: Diagnosis not present

## 2015-05-01 DIAGNOSIS — I2510 Atherosclerotic heart disease of native coronary artery without angina pectoris: Secondary | ICD-10-CM | POA: Diagnosis not present

## 2015-05-01 DIAGNOSIS — N179 Acute kidney failure, unspecified: Secondary | ICD-10-CM | POA: Diagnosis not present

## 2015-05-10 ENCOUNTER — Telehealth: Payer: Self-pay | Admitting: Internal Medicine

## 2015-05-10 DIAGNOSIS — J45909 Unspecified asthma, uncomplicated: Secondary | ICD-10-CM | POA: Diagnosis not present

## 2015-05-10 DIAGNOSIS — M1990 Unspecified osteoarthritis, unspecified site: Secondary | ICD-10-CM | POA: Diagnosis not present

## 2015-05-10 DIAGNOSIS — Z794 Long term (current) use of insulin: Secondary | ICD-10-CM | POA: Diagnosis not present

## 2015-05-10 DIAGNOSIS — Z9181 History of falling: Secondary | ICD-10-CM | POA: Diagnosis not present

## 2015-05-10 DIAGNOSIS — E119 Type 2 diabetes mellitus without complications: Secondary | ICD-10-CM | POA: Diagnosis not present

## 2015-05-10 DIAGNOSIS — I2510 Atherosclerotic heart disease of native coronary artery without angina pectoris: Secondary | ICD-10-CM | POA: Diagnosis not present

## 2015-05-10 DIAGNOSIS — Z96651 Presence of right artificial knee joint: Secondary | ICD-10-CM | POA: Diagnosis not present

## 2015-05-10 DIAGNOSIS — Z951 Presence of aortocoronary bypass graft: Secondary | ICD-10-CM | POA: Diagnosis not present

## 2015-05-10 DIAGNOSIS — S82201G Unspecified fracture of shaft of right tibia, subsequent encounter for closed fracture with delayed healing: Secondary | ICD-10-CM | POA: Diagnosis not present

## 2015-05-10 DIAGNOSIS — Z7982 Long term (current) use of aspirin: Secondary | ICD-10-CM | POA: Diagnosis not present

## 2015-05-10 DIAGNOSIS — M810 Age-related osteoporosis without current pathological fracture: Secondary | ICD-10-CM | POA: Diagnosis not present

## 2015-05-10 DIAGNOSIS — D649 Anemia, unspecified: Secondary | ICD-10-CM | POA: Diagnosis not present

## 2015-05-10 DIAGNOSIS — I5020 Unspecified systolic (congestive) heart failure: Secondary | ICD-10-CM | POA: Diagnosis not present

## 2015-05-10 NOTE — Telephone Encounter (Signed)
Verbal authorization given per MD protocol 

## 2015-05-10 NOTE — Telephone Encounter (Signed)
Holly Weiss from [facility] called requesting nursing visits 1 x wk for 9 wks and  1 x next week to evaluate physical therapy and OT and then they will call back for rest. Please call Holly Weiss at [phone]

## 2015-05-13 DIAGNOSIS — Z951 Presence of aortocoronary bypass graft: Secondary | ICD-10-CM | POA: Diagnosis not present

## 2015-05-13 DIAGNOSIS — M810 Age-related osteoporosis without current pathological fracture: Secondary | ICD-10-CM | POA: Diagnosis not present

## 2015-05-13 DIAGNOSIS — E119 Type 2 diabetes mellitus without complications: Secondary | ICD-10-CM | POA: Diagnosis not present

## 2015-05-13 DIAGNOSIS — Z96651 Presence of right artificial knee joint: Secondary | ICD-10-CM | POA: Diagnosis not present

## 2015-05-13 DIAGNOSIS — Z794 Long term (current) use of insulin: Secondary | ICD-10-CM | POA: Diagnosis not present

## 2015-05-13 DIAGNOSIS — S82201G Unspecified fracture of shaft of right tibia, subsequent encounter for closed fracture with delayed healing: Secondary | ICD-10-CM | POA: Diagnosis not present

## 2015-05-13 DIAGNOSIS — Z9181 History of falling: Secondary | ICD-10-CM | POA: Diagnosis not present

## 2015-05-13 DIAGNOSIS — Z7982 Long term (current) use of aspirin: Secondary | ICD-10-CM | POA: Diagnosis not present

## 2015-05-13 DIAGNOSIS — D649 Anemia, unspecified: Secondary | ICD-10-CM | POA: Diagnosis not present

## 2015-05-13 DIAGNOSIS — I2510 Atherosclerotic heart disease of native coronary artery without angina pectoris: Secondary | ICD-10-CM | POA: Diagnosis not present

## 2015-05-13 DIAGNOSIS — I5020 Unspecified systolic (congestive) heart failure: Secondary | ICD-10-CM | POA: Diagnosis not present

## 2015-05-13 DIAGNOSIS — M1990 Unspecified osteoarthritis, unspecified site: Secondary | ICD-10-CM | POA: Diagnosis not present

## 2015-05-13 DIAGNOSIS — J45909 Unspecified asthma, uncomplicated: Secondary | ICD-10-CM | POA: Diagnosis not present

## 2015-05-17 DIAGNOSIS — Z96651 Presence of right artificial knee joint: Secondary | ICD-10-CM | POA: Diagnosis not present

## 2015-05-17 DIAGNOSIS — Z9181 History of falling: Secondary | ICD-10-CM | POA: Diagnosis not present

## 2015-05-17 DIAGNOSIS — S82201G Unspecified fracture of shaft of right tibia, subsequent encounter for closed fracture with delayed healing: Secondary | ICD-10-CM | POA: Diagnosis not present

## 2015-05-17 DIAGNOSIS — E119 Type 2 diabetes mellitus without complications: Secondary | ICD-10-CM | POA: Diagnosis not present

## 2015-05-17 DIAGNOSIS — J45909 Unspecified asthma, uncomplicated: Secondary | ICD-10-CM | POA: Diagnosis not present

## 2015-05-17 DIAGNOSIS — M810 Age-related osteoporosis without current pathological fracture: Secondary | ICD-10-CM | POA: Diagnosis not present

## 2015-05-17 DIAGNOSIS — Z951 Presence of aortocoronary bypass graft: Secondary | ICD-10-CM | POA: Diagnosis not present

## 2015-05-17 DIAGNOSIS — Z7982 Long term (current) use of aspirin: Secondary | ICD-10-CM | POA: Diagnosis not present

## 2015-05-17 DIAGNOSIS — Z794 Long term (current) use of insulin: Secondary | ICD-10-CM | POA: Diagnosis not present

## 2015-05-17 DIAGNOSIS — M1990 Unspecified osteoarthritis, unspecified site: Secondary | ICD-10-CM | POA: Diagnosis not present

## 2015-05-17 DIAGNOSIS — I5020 Unspecified systolic (congestive) heart failure: Secondary | ICD-10-CM | POA: Diagnosis not present

## 2015-05-17 DIAGNOSIS — D649 Anemia, unspecified: Secondary | ICD-10-CM | POA: Diagnosis not present

## 2015-05-17 DIAGNOSIS — I2510 Atherosclerotic heart disease of native coronary artery without angina pectoris: Secondary | ICD-10-CM | POA: Diagnosis not present

## 2015-05-20 ENCOUNTER — Telehealth: Payer: Self-pay | Admitting: Internal Medicine

## 2015-05-20 DIAGNOSIS — Z96651 Presence of right artificial knee joint: Secondary | ICD-10-CM | POA: Diagnosis not present

## 2015-05-20 DIAGNOSIS — J45909 Unspecified asthma, uncomplicated: Secondary | ICD-10-CM | POA: Diagnosis not present

## 2015-05-20 DIAGNOSIS — Z951 Presence of aortocoronary bypass graft: Secondary | ICD-10-CM | POA: Diagnosis not present

## 2015-05-20 DIAGNOSIS — E119 Type 2 diabetes mellitus without complications: Secondary | ICD-10-CM | POA: Diagnosis not present

## 2015-05-20 DIAGNOSIS — S82201G Unspecified fracture of shaft of right tibia, subsequent encounter for closed fracture with delayed healing: Secondary | ICD-10-CM | POA: Diagnosis not present

## 2015-05-20 DIAGNOSIS — M1990 Unspecified osteoarthritis, unspecified site: Secondary | ICD-10-CM | POA: Diagnosis not present

## 2015-05-20 DIAGNOSIS — I5020 Unspecified systolic (congestive) heart failure: Secondary | ICD-10-CM | POA: Diagnosis not present

## 2015-05-20 DIAGNOSIS — Z9181 History of falling: Secondary | ICD-10-CM | POA: Diagnosis not present

## 2015-05-20 DIAGNOSIS — Z794 Long term (current) use of insulin: Secondary | ICD-10-CM | POA: Diagnosis not present

## 2015-05-20 DIAGNOSIS — D649 Anemia, unspecified: Secondary | ICD-10-CM | POA: Diagnosis not present

## 2015-05-20 DIAGNOSIS — I2510 Atherosclerotic heart disease of native coronary artery without angina pectoris: Secondary | ICD-10-CM | POA: Diagnosis not present

## 2015-05-20 DIAGNOSIS — Z7982 Long term (current) use of aspirin: Secondary | ICD-10-CM | POA: Diagnosis not present

## 2015-05-20 DIAGNOSIS — M810 Age-related osteoporosis without current pathological fracture: Secondary | ICD-10-CM | POA: Diagnosis not present

## 2015-05-20 NOTE — Telephone Encounter (Signed)
Request for Verbal order for OT for twice a week for two weeks to work on adl safety and upper body strengthening.

## 2015-05-20 NOTE — Telephone Encounter (Signed)
Ok for verbal 

## 2015-05-21 DIAGNOSIS — Z9889 Other specified postprocedural states: Secondary | ICD-10-CM | POA: Diagnosis not present

## 2015-05-21 DIAGNOSIS — S82209J Unspecified fracture of shaft of unspecified tibia, subsequent encounter for open fracture type IIIA, IIIB, or IIIC with delayed healing: Secondary | ICD-10-CM | POA: Diagnosis not present

## 2015-05-21 DIAGNOSIS — Z7982 Long term (current) use of aspirin: Secondary | ICD-10-CM | POA: Diagnosis not present

## 2015-05-21 NOTE — Telephone Encounter (Signed)
Notified Margaret with md response...Holly Weiss

## 2015-05-22 DIAGNOSIS — S82201G Unspecified fracture of shaft of right tibia, subsequent encounter for closed fracture with delayed healing: Secondary | ICD-10-CM | POA: Diagnosis not present

## 2015-05-22 DIAGNOSIS — Z951 Presence of aortocoronary bypass graft: Secondary | ICD-10-CM | POA: Diagnosis not present

## 2015-05-22 DIAGNOSIS — Z794 Long term (current) use of insulin: Secondary | ICD-10-CM | POA: Diagnosis not present

## 2015-05-22 DIAGNOSIS — Z9889 Other specified postprocedural states: Secondary | ICD-10-CM | POA: Diagnosis not present

## 2015-05-22 DIAGNOSIS — I5020 Unspecified systolic (congestive) heart failure: Secondary | ICD-10-CM | POA: Diagnosis not present

## 2015-05-22 DIAGNOSIS — D649 Anemia, unspecified: Secondary | ICD-10-CM | POA: Diagnosis not present

## 2015-05-22 DIAGNOSIS — M1990 Unspecified osteoarthritis, unspecified site: Secondary | ICD-10-CM | POA: Diagnosis not present

## 2015-05-22 DIAGNOSIS — Z7982 Long term (current) use of aspirin: Secondary | ICD-10-CM | POA: Diagnosis not present

## 2015-05-22 DIAGNOSIS — E119 Type 2 diabetes mellitus without complications: Secondary | ICD-10-CM | POA: Diagnosis not present

## 2015-05-22 DIAGNOSIS — M810 Age-related osteoporosis without current pathological fracture: Secondary | ICD-10-CM | POA: Diagnosis not present

## 2015-05-22 DIAGNOSIS — Z9181 History of falling: Secondary | ICD-10-CM | POA: Diagnosis not present

## 2015-05-22 DIAGNOSIS — J45909 Unspecified asthma, uncomplicated: Secondary | ICD-10-CM | POA: Diagnosis not present

## 2015-05-22 DIAGNOSIS — S82209J Unspecified fracture of shaft of unspecified tibia, subsequent encounter for open fracture type IIIA, IIIB, or IIIC with delayed healing: Secondary | ICD-10-CM | POA: Diagnosis not present

## 2015-05-22 DIAGNOSIS — Z96651 Presence of right artificial knee joint: Secondary | ICD-10-CM | POA: Diagnosis not present

## 2015-05-22 DIAGNOSIS — I2510 Atherosclerotic heart disease of native coronary artery without angina pectoris: Secondary | ICD-10-CM | POA: Diagnosis not present

## 2015-05-23 DIAGNOSIS — I5020 Unspecified systolic (congestive) heart failure: Secondary | ICD-10-CM | POA: Diagnosis not present

## 2015-05-23 DIAGNOSIS — Z9181 History of falling: Secondary | ICD-10-CM | POA: Diagnosis not present

## 2015-05-23 DIAGNOSIS — Z7982 Long term (current) use of aspirin: Secondary | ICD-10-CM | POA: Diagnosis not present

## 2015-05-23 DIAGNOSIS — M1990 Unspecified osteoarthritis, unspecified site: Secondary | ICD-10-CM | POA: Diagnosis not present

## 2015-05-23 DIAGNOSIS — M810 Age-related osteoporosis without current pathological fracture: Secondary | ICD-10-CM | POA: Diagnosis not present

## 2015-05-23 DIAGNOSIS — E119 Type 2 diabetes mellitus without complications: Secondary | ICD-10-CM | POA: Diagnosis not present

## 2015-05-23 DIAGNOSIS — D649 Anemia, unspecified: Secondary | ICD-10-CM | POA: Diagnosis not present

## 2015-05-23 DIAGNOSIS — I2510 Atherosclerotic heart disease of native coronary artery without angina pectoris: Secondary | ICD-10-CM | POA: Diagnosis not present

## 2015-05-23 DIAGNOSIS — J45909 Unspecified asthma, uncomplicated: Secondary | ICD-10-CM | POA: Diagnosis not present

## 2015-05-23 DIAGNOSIS — Z951 Presence of aortocoronary bypass graft: Secondary | ICD-10-CM | POA: Diagnosis not present

## 2015-05-23 DIAGNOSIS — S82201G Unspecified fracture of shaft of right tibia, subsequent encounter for closed fracture with delayed healing: Secondary | ICD-10-CM | POA: Diagnosis not present

## 2015-05-23 DIAGNOSIS — Z96651 Presence of right artificial knee joint: Secondary | ICD-10-CM | POA: Diagnosis not present

## 2015-05-23 DIAGNOSIS — Z794 Long term (current) use of insulin: Secondary | ICD-10-CM | POA: Diagnosis not present

## 2015-05-24 DIAGNOSIS — E119 Type 2 diabetes mellitus without complications: Secondary | ICD-10-CM | POA: Diagnosis not present

## 2015-05-24 DIAGNOSIS — M1990 Unspecified osteoarthritis, unspecified site: Secondary | ICD-10-CM | POA: Diagnosis not present

## 2015-05-24 DIAGNOSIS — I5020 Unspecified systolic (congestive) heart failure: Secondary | ICD-10-CM | POA: Diagnosis not present

## 2015-05-24 DIAGNOSIS — D649 Anemia, unspecified: Secondary | ICD-10-CM | POA: Diagnosis not present

## 2015-05-24 DIAGNOSIS — Z951 Presence of aortocoronary bypass graft: Secondary | ICD-10-CM | POA: Diagnosis not present

## 2015-05-24 DIAGNOSIS — J45909 Unspecified asthma, uncomplicated: Secondary | ICD-10-CM | POA: Diagnosis not present

## 2015-05-24 DIAGNOSIS — Z7982 Long term (current) use of aspirin: Secondary | ICD-10-CM | POA: Diagnosis not present

## 2015-05-24 DIAGNOSIS — Z794 Long term (current) use of insulin: Secondary | ICD-10-CM | POA: Diagnosis not present

## 2015-05-24 DIAGNOSIS — S82201G Unspecified fracture of shaft of right tibia, subsequent encounter for closed fracture with delayed healing: Secondary | ICD-10-CM | POA: Diagnosis not present

## 2015-05-24 DIAGNOSIS — S82291K Other fracture of shaft of right tibia, subsequent encounter for closed fracture with nonunion: Secondary | ICD-10-CM | POA: Diagnosis not present

## 2015-05-24 DIAGNOSIS — M810 Age-related osteoporosis without current pathological fracture: Secondary | ICD-10-CM | POA: Diagnosis not present

## 2015-05-24 DIAGNOSIS — Z96651 Presence of right artificial knee joint: Secondary | ICD-10-CM | POA: Diagnosis not present

## 2015-05-24 DIAGNOSIS — I2510 Atherosclerotic heart disease of native coronary artery without angina pectoris: Secondary | ICD-10-CM | POA: Diagnosis not present

## 2015-05-24 DIAGNOSIS — Z48812 Encounter for surgical aftercare following surgery on the circulatory system: Secondary | ICD-10-CM | POA: Diagnosis not present

## 2015-05-24 DIAGNOSIS — G5621 Lesion of ulnar nerve, right upper limb: Secondary | ICD-10-CM | POA: Diagnosis not present

## 2015-05-24 DIAGNOSIS — Z9181 History of falling: Secondary | ICD-10-CM | POA: Diagnosis not present

## 2015-05-27 DIAGNOSIS — I2510 Atherosclerotic heart disease of native coronary artery without angina pectoris: Secondary | ICD-10-CM | POA: Diagnosis not present

## 2015-05-27 DIAGNOSIS — Z9181 History of falling: Secondary | ICD-10-CM | POA: Diagnosis not present

## 2015-05-27 DIAGNOSIS — M1990 Unspecified osteoarthritis, unspecified site: Secondary | ICD-10-CM | POA: Diagnosis not present

## 2015-05-27 DIAGNOSIS — M810 Age-related osteoporosis without current pathological fracture: Secondary | ICD-10-CM | POA: Diagnosis not present

## 2015-05-27 DIAGNOSIS — Z951 Presence of aortocoronary bypass graft: Secondary | ICD-10-CM | POA: Diagnosis not present

## 2015-05-27 DIAGNOSIS — D649 Anemia, unspecified: Secondary | ICD-10-CM | POA: Diagnosis not present

## 2015-05-27 DIAGNOSIS — Z96651 Presence of right artificial knee joint: Secondary | ICD-10-CM | POA: Diagnosis not present

## 2015-05-27 DIAGNOSIS — E119 Type 2 diabetes mellitus without complications: Secondary | ICD-10-CM | POA: Diagnosis not present

## 2015-05-27 DIAGNOSIS — S82201G Unspecified fracture of shaft of right tibia, subsequent encounter for closed fracture with delayed healing: Secondary | ICD-10-CM | POA: Diagnosis not present

## 2015-05-27 DIAGNOSIS — Z7982 Long term (current) use of aspirin: Secondary | ICD-10-CM | POA: Diagnosis not present

## 2015-05-27 DIAGNOSIS — J45909 Unspecified asthma, uncomplicated: Secondary | ICD-10-CM | POA: Diagnosis not present

## 2015-05-27 DIAGNOSIS — Z794 Long term (current) use of insulin: Secondary | ICD-10-CM | POA: Diagnosis not present

## 2015-05-27 DIAGNOSIS — I5020 Unspecified systolic (congestive) heart failure: Secondary | ICD-10-CM | POA: Diagnosis not present

## 2015-05-28 DIAGNOSIS — D649 Anemia, unspecified: Secondary | ICD-10-CM | POA: Diagnosis not present

## 2015-05-28 DIAGNOSIS — M1990 Unspecified osteoarthritis, unspecified site: Secondary | ICD-10-CM | POA: Diagnosis not present

## 2015-05-28 DIAGNOSIS — Z794 Long term (current) use of insulin: Secondary | ICD-10-CM | POA: Diagnosis not present

## 2015-05-28 DIAGNOSIS — I5020 Unspecified systolic (congestive) heart failure: Secondary | ICD-10-CM | POA: Diagnosis not present

## 2015-05-28 DIAGNOSIS — J45909 Unspecified asthma, uncomplicated: Secondary | ICD-10-CM | POA: Diagnosis not present

## 2015-05-28 DIAGNOSIS — M810 Age-related osteoporosis without current pathological fracture: Secondary | ICD-10-CM | POA: Diagnosis not present

## 2015-05-28 DIAGNOSIS — I2510 Atherosclerotic heart disease of native coronary artery without angina pectoris: Secondary | ICD-10-CM | POA: Diagnosis not present

## 2015-05-28 DIAGNOSIS — Z951 Presence of aortocoronary bypass graft: Secondary | ICD-10-CM | POA: Diagnosis not present

## 2015-05-28 DIAGNOSIS — S82201G Unspecified fracture of shaft of right tibia, subsequent encounter for closed fracture with delayed healing: Secondary | ICD-10-CM | POA: Diagnosis not present

## 2015-05-28 DIAGNOSIS — Z7982 Long term (current) use of aspirin: Secondary | ICD-10-CM | POA: Diagnosis not present

## 2015-05-28 DIAGNOSIS — Z9181 History of falling: Secondary | ICD-10-CM | POA: Diagnosis not present

## 2015-05-28 DIAGNOSIS — E119 Type 2 diabetes mellitus without complications: Secondary | ICD-10-CM | POA: Diagnosis not present

## 2015-05-28 DIAGNOSIS — Z96651 Presence of right artificial knee joint: Secondary | ICD-10-CM | POA: Diagnosis not present

## 2015-05-29 DIAGNOSIS — I2510 Atherosclerotic heart disease of native coronary artery without angina pectoris: Secondary | ICD-10-CM | POA: Diagnosis not present

## 2015-05-29 DIAGNOSIS — I5020 Unspecified systolic (congestive) heart failure: Secondary | ICD-10-CM | POA: Diagnosis not present

## 2015-05-29 DIAGNOSIS — M810 Age-related osteoporosis without current pathological fracture: Secondary | ICD-10-CM | POA: Diagnosis not present

## 2015-05-29 DIAGNOSIS — S82201G Unspecified fracture of shaft of right tibia, subsequent encounter for closed fracture with delayed healing: Secondary | ICD-10-CM | POA: Diagnosis not present

## 2015-05-29 DIAGNOSIS — E119 Type 2 diabetes mellitus without complications: Secondary | ICD-10-CM | POA: Diagnosis not present

## 2015-05-29 DIAGNOSIS — Z951 Presence of aortocoronary bypass graft: Secondary | ICD-10-CM | POA: Diagnosis not present

## 2015-05-29 DIAGNOSIS — Z96651 Presence of right artificial knee joint: Secondary | ICD-10-CM | POA: Diagnosis not present

## 2015-05-29 DIAGNOSIS — Z7982 Long term (current) use of aspirin: Secondary | ICD-10-CM | POA: Diagnosis not present

## 2015-05-29 DIAGNOSIS — J45909 Unspecified asthma, uncomplicated: Secondary | ICD-10-CM | POA: Diagnosis not present

## 2015-05-29 DIAGNOSIS — D649 Anemia, unspecified: Secondary | ICD-10-CM | POA: Diagnosis not present

## 2015-05-29 DIAGNOSIS — M1990 Unspecified osteoarthritis, unspecified site: Secondary | ICD-10-CM | POA: Diagnosis not present

## 2015-05-29 DIAGNOSIS — Z9181 History of falling: Secondary | ICD-10-CM | POA: Diagnosis not present

## 2015-05-29 DIAGNOSIS — Z794 Long term (current) use of insulin: Secondary | ICD-10-CM | POA: Diagnosis not present

## 2015-05-31 DIAGNOSIS — I2510 Atherosclerotic heart disease of native coronary artery without angina pectoris: Secondary | ICD-10-CM | POA: Diagnosis not present

## 2015-05-31 DIAGNOSIS — Z96651 Presence of right artificial knee joint: Secondary | ICD-10-CM | POA: Diagnosis not present

## 2015-05-31 DIAGNOSIS — I5020 Unspecified systolic (congestive) heart failure: Secondary | ICD-10-CM | POA: Diagnosis not present

## 2015-05-31 DIAGNOSIS — Z7982 Long term (current) use of aspirin: Secondary | ICD-10-CM | POA: Diagnosis not present

## 2015-05-31 DIAGNOSIS — S82201G Unspecified fracture of shaft of right tibia, subsequent encounter for closed fracture with delayed healing: Secondary | ICD-10-CM | POA: Diagnosis not present

## 2015-05-31 DIAGNOSIS — E119 Type 2 diabetes mellitus without complications: Secondary | ICD-10-CM | POA: Diagnosis not present

## 2015-05-31 DIAGNOSIS — Z951 Presence of aortocoronary bypass graft: Secondary | ICD-10-CM | POA: Diagnosis not present

## 2015-05-31 DIAGNOSIS — J45909 Unspecified asthma, uncomplicated: Secondary | ICD-10-CM | POA: Diagnosis not present

## 2015-05-31 DIAGNOSIS — M810 Age-related osteoporosis without current pathological fracture: Secondary | ICD-10-CM | POA: Diagnosis not present

## 2015-05-31 DIAGNOSIS — M1990 Unspecified osteoarthritis, unspecified site: Secondary | ICD-10-CM | POA: Diagnosis not present

## 2015-05-31 DIAGNOSIS — Z794 Long term (current) use of insulin: Secondary | ICD-10-CM | POA: Diagnosis not present

## 2015-05-31 DIAGNOSIS — Z9181 History of falling: Secondary | ICD-10-CM | POA: Diagnosis not present

## 2015-05-31 DIAGNOSIS — D649 Anemia, unspecified: Secondary | ICD-10-CM | POA: Diagnosis not present

## 2015-06-03 DIAGNOSIS — J45909 Unspecified asthma, uncomplicated: Secondary | ICD-10-CM | POA: Diagnosis not present

## 2015-06-03 DIAGNOSIS — M810 Age-related osteoporosis without current pathological fracture: Secondary | ICD-10-CM | POA: Diagnosis not present

## 2015-06-03 DIAGNOSIS — Z96651 Presence of right artificial knee joint: Secondary | ICD-10-CM | POA: Diagnosis not present

## 2015-06-03 DIAGNOSIS — I2510 Atherosclerotic heart disease of native coronary artery without angina pectoris: Secondary | ICD-10-CM | POA: Diagnosis not present

## 2015-06-03 DIAGNOSIS — Z9181 History of falling: Secondary | ICD-10-CM | POA: Diagnosis not present

## 2015-06-03 DIAGNOSIS — E119 Type 2 diabetes mellitus without complications: Secondary | ICD-10-CM | POA: Diagnosis not present

## 2015-06-03 DIAGNOSIS — I5020 Unspecified systolic (congestive) heart failure: Secondary | ICD-10-CM | POA: Diagnosis not present

## 2015-06-03 DIAGNOSIS — Z7982 Long term (current) use of aspirin: Secondary | ICD-10-CM | POA: Diagnosis not present

## 2015-06-03 DIAGNOSIS — S82201G Unspecified fracture of shaft of right tibia, subsequent encounter for closed fracture with delayed healing: Secondary | ICD-10-CM | POA: Diagnosis not present

## 2015-06-03 DIAGNOSIS — M1990 Unspecified osteoarthritis, unspecified site: Secondary | ICD-10-CM | POA: Diagnosis not present

## 2015-06-03 DIAGNOSIS — Z951 Presence of aortocoronary bypass graft: Secondary | ICD-10-CM | POA: Diagnosis not present

## 2015-06-03 DIAGNOSIS — D649 Anemia, unspecified: Secondary | ICD-10-CM | POA: Diagnosis not present

## 2015-06-03 DIAGNOSIS — Z794 Long term (current) use of insulin: Secondary | ICD-10-CM | POA: Diagnosis not present

## 2015-06-04 DIAGNOSIS — I5022 Chronic systolic (congestive) heart failure: Secondary | ICD-10-CM | POA: Diagnosis not present

## 2015-06-04 DIAGNOSIS — I2510 Atherosclerotic heart disease of native coronary artery without angina pectoris: Secondary | ICD-10-CM | POA: Diagnosis not present

## 2015-06-04 DIAGNOSIS — E785 Hyperlipidemia, unspecified: Secondary | ICD-10-CM | POA: Diagnosis not present

## 2015-06-04 DIAGNOSIS — S82201J Unspecified fracture of shaft of right tibia, subsequent encounter for open fracture type IIIA, IIIB, or IIIC with delayed healing: Secondary | ICD-10-CM | POA: Diagnosis not present

## 2015-06-04 DIAGNOSIS — M899 Disorder of bone, unspecified: Secondary | ICD-10-CM | POA: Diagnosis not present

## 2015-06-04 DIAGNOSIS — M949 Disorder of cartilage, unspecified: Secondary | ICD-10-CM | POA: Diagnosis not present

## 2015-06-04 DIAGNOSIS — E119 Type 2 diabetes mellitus without complications: Secondary | ICD-10-CM | POA: Diagnosis not present

## 2015-06-04 DIAGNOSIS — Z794 Long term (current) use of insulin: Secondary | ICD-10-CM | POA: Diagnosis not present

## 2015-06-04 DIAGNOSIS — Z888 Allergy status to other drugs, medicaments and biological substances status: Secondary | ICD-10-CM | POA: Diagnosis not present

## 2015-06-04 DIAGNOSIS — Z951 Presence of aortocoronary bypass graft: Secondary | ICD-10-CM | POA: Diagnosis not present

## 2015-06-04 DIAGNOSIS — J45909 Unspecified asthma, uncomplicated: Secondary | ICD-10-CM | POA: Diagnosis not present

## 2015-06-04 DIAGNOSIS — J4520 Mild intermittent asthma, uncomplicated: Secondary | ICD-10-CM | POA: Diagnosis not present

## 2015-06-04 DIAGNOSIS — M810 Age-related osteoporosis without current pathological fracture: Secondary | ICD-10-CM | POA: Diagnosis not present

## 2015-06-04 DIAGNOSIS — S82201K Unspecified fracture of shaft of right tibia, subsequent encounter for closed fracture with nonunion: Secondary | ICD-10-CM | POA: Diagnosis not present

## 2015-06-04 DIAGNOSIS — I259 Chronic ischemic heart disease, unspecified: Secondary | ICD-10-CM | POA: Diagnosis not present

## 2015-06-04 DIAGNOSIS — S82401K Unspecified fracture of shaft of right fibula, subsequent encounter for closed fracture with nonunion: Secondary | ICD-10-CM | POA: Diagnosis not present

## 2015-06-04 DIAGNOSIS — S82209J Unspecified fracture of shaft of unspecified tibia, subsequent encounter for open fracture type IIIA, IIIB, or IIIC with delayed healing: Secondary | ICD-10-CM | POA: Diagnosis not present

## 2015-06-04 DIAGNOSIS — Z7982 Long term (current) use of aspirin: Secondary | ICD-10-CM | POA: Diagnosis not present

## 2015-06-04 DIAGNOSIS — Z79899 Other long term (current) drug therapy: Secondary | ICD-10-CM | POA: Diagnosis not present

## 2015-06-05 DIAGNOSIS — M810 Age-related osteoporosis without current pathological fracture: Secondary | ICD-10-CM | POA: Diagnosis not present

## 2015-06-05 DIAGNOSIS — M1990 Unspecified osteoarthritis, unspecified site: Secondary | ICD-10-CM | POA: Diagnosis not present

## 2015-06-05 DIAGNOSIS — I2510 Atherosclerotic heart disease of native coronary artery without angina pectoris: Secondary | ICD-10-CM | POA: Diagnosis not present

## 2015-06-05 DIAGNOSIS — J45909 Unspecified asthma, uncomplicated: Secondary | ICD-10-CM | POA: Diagnosis not present

## 2015-06-05 DIAGNOSIS — Z9181 History of falling: Secondary | ICD-10-CM | POA: Diagnosis not present

## 2015-06-05 DIAGNOSIS — I5020 Unspecified systolic (congestive) heart failure: Secondary | ICD-10-CM | POA: Diagnosis not present

## 2015-06-05 DIAGNOSIS — Z794 Long term (current) use of insulin: Secondary | ICD-10-CM | POA: Diagnosis not present

## 2015-06-05 DIAGNOSIS — Z96651 Presence of right artificial knee joint: Secondary | ICD-10-CM | POA: Diagnosis not present

## 2015-06-05 DIAGNOSIS — S82201G Unspecified fracture of shaft of right tibia, subsequent encounter for closed fracture with delayed healing: Secondary | ICD-10-CM | POA: Diagnosis not present

## 2015-06-05 DIAGNOSIS — D649 Anemia, unspecified: Secondary | ICD-10-CM | POA: Diagnosis not present

## 2015-06-05 DIAGNOSIS — E119 Type 2 diabetes mellitus without complications: Secondary | ICD-10-CM | POA: Diagnosis not present

## 2015-06-05 DIAGNOSIS — Z7982 Long term (current) use of aspirin: Secondary | ICD-10-CM | POA: Diagnosis not present

## 2015-06-05 DIAGNOSIS — Z951 Presence of aortocoronary bypass graft: Secondary | ICD-10-CM | POA: Diagnosis not present

## 2015-06-06 DIAGNOSIS — Z96651 Presence of right artificial knee joint: Secondary | ICD-10-CM | POA: Diagnosis not present

## 2015-06-06 DIAGNOSIS — J45909 Unspecified asthma, uncomplicated: Secondary | ICD-10-CM | POA: Diagnosis not present

## 2015-06-06 DIAGNOSIS — Z7982 Long term (current) use of aspirin: Secondary | ICD-10-CM | POA: Diagnosis not present

## 2015-06-06 DIAGNOSIS — M1990 Unspecified osteoarthritis, unspecified site: Secondary | ICD-10-CM | POA: Diagnosis not present

## 2015-06-06 DIAGNOSIS — Z951 Presence of aortocoronary bypass graft: Secondary | ICD-10-CM | POA: Diagnosis not present

## 2015-06-06 DIAGNOSIS — D649 Anemia, unspecified: Secondary | ICD-10-CM | POA: Diagnosis not present

## 2015-06-06 DIAGNOSIS — I5020 Unspecified systolic (congestive) heart failure: Secondary | ICD-10-CM | POA: Diagnosis not present

## 2015-06-06 DIAGNOSIS — Z794 Long term (current) use of insulin: Secondary | ICD-10-CM | POA: Diagnosis not present

## 2015-06-06 DIAGNOSIS — S82201G Unspecified fracture of shaft of right tibia, subsequent encounter for closed fracture with delayed healing: Secondary | ICD-10-CM | POA: Diagnosis not present

## 2015-06-06 DIAGNOSIS — M810 Age-related osteoporosis without current pathological fracture: Secondary | ICD-10-CM | POA: Diagnosis not present

## 2015-06-06 DIAGNOSIS — I2510 Atherosclerotic heart disease of native coronary artery without angina pectoris: Secondary | ICD-10-CM | POA: Diagnosis not present

## 2015-06-06 DIAGNOSIS — Z9181 History of falling: Secondary | ICD-10-CM | POA: Diagnosis not present

## 2015-06-06 DIAGNOSIS — E119 Type 2 diabetes mellitus without complications: Secondary | ICD-10-CM | POA: Diagnosis not present

## 2015-06-07 DIAGNOSIS — Z794 Long term (current) use of insulin: Secondary | ICD-10-CM | POA: Diagnosis not present

## 2015-06-07 DIAGNOSIS — E119 Type 2 diabetes mellitus without complications: Secondary | ICD-10-CM | POA: Diagnosis not present

## 2015-06-07 DIAGNOSIS — Z951 Presence of aortocoronary bypass graft: Secondary | ICD-10-CM | POA: Diagnosis not present

## 2015-06-07 DIAGNOSIS — Z9181 History of falling: Secondary | ICD-10-CM | POA: Diagnosis not present

## 2015-06-07 DIAGNOSIS — J45909 Unspecified asthma, uncomplicated: Secondary | ICD-10-CM | POA: Diagnosis not present

## 2015-06-07 DIAGNOSIS — I5020 Unspecified systolic (congestive) heart failure: Secondary | ICD-10-CM | POA: Diagnosis not present

## 2015-06-07 DIAGNOSIS — S82201G Unspecified fracture of shaft of right tibia, subsequent encounter for closed fracture with delayed healing: Secondary | ICD-10-CM | POA: Diagnosis not present

## 2015-06-07 DIAGNOSIS — I2510 Atherosclerotic heart disease of native coronary artery without angina pectoris: Secondary | ICD-10-CM | POA: Diagnosis not present

## 2015-06-07 DIAGNOSIS — M1990 Unspecified osteoarthritis, unspecified site: Secondary | ICD-10-CM | POA: Diagnosis not present

## 2015-06-07 DIAGNOSIS — D649 Anemia, unspecified: Secondary | ICD-10-CM | POA: Diagnosis not present

## 2015-06-07 DIAGNOSIS — Z96651 Presence of right artificial knee joint: Secondary | ICD-10-CM | POA: Diagnosis not present

## 2015-06-07 DIAGNOSIS — M810 Age-related osteoporosis without current pathological fracture: Secondary | ICD-10-CM | POA: Diagnosis not present

## 2015-06-07 DIAGNOSIS — Z7982 Long term (current) use of aspirin: Secondary | ICD-10-CM | POA: Diagnosis not present

## 2015-06-10 DIAGNOSIS — M1990 Unspecified osteoarthritis, unspecified site: Secondary | ICD-10-CM | POA: Diagnosis not present

## 2015-06-10 DIAGNOSIS — Z794 Long term (current) use of insulin: Secondary | ICD-10-CM | POA: Diagnosis not present

## 2015-06-10 DIAGNOSIS — I5020 Unspecified systolic (congestive) heart failure: Secondary | ICD-10-CM | POA: Diagnosis not present

## 2015-06-10 DIAGNOSIS — E119 Type 2 diabetes mellitus without complications: Secondary | ICD-10-CM | POA: Diagnosis not present

## 2015-06-10 DIAGNOSIS — Z96651 Presence of right artificial knee joint: Secondary | ICD-10-CM | POA: Diagnosis not present

## 2015-06-10 DIAGNOSIS — Z951 Presence of aortocoronary bypass graft: Secondary | ICD-10-CM | POA: Diagnosis not present

## 2015-06-10 DIAGNOSIS — D649 Anemia, unspecified: Secondary | ICD-10-CM | POA: Diagnosis not present

## 2015-06-10 DIAGNOSIS — M810 Age-related osteoporosis without current pathological fracture: Secondary | ICD-10-CM | POA: Diagnosis not present

## 2015-06-10 DIAGNOSIS — Z9181 History of falling: Secondary | ICD-10-CM | POA: Diagnosis not present

## 2015-06-10 DIAGNOSIS — Z7982 Long term (current) use of aspirin: Secondary | ICD-10-CM | POA: Diagnosis not present

## 2015-06-10 DIAGNOSIS — J45909 Unspecified asthma, uncomplicated: Secondary | ICD-10-CM | POA: Diagnosis not present

## 2015-06-10 DIAGNOSIS — I2510 Atherosclerotic heart disease of native coronary artery without angina pectoris: Secondary | ICD-10-CM | POA: Diagnosis not present

## 2015-06-10 DIAGNOSIS — S82201G Unspecified fracture of shaft of right tibia, subsequent encounter for closed fracture with delayed healing: Secondary | ICD-10-CM | POA: Diagnosis not present

## 2015-06-12 DIAGNOSIS — I2510 Atherosclerotic heart disease of native coronary artery without angina pectoris: Secondary | ICD-10-CM | POA: Diagnosis not present

## 2015-06-12 DIAGNOSIS — Z96651 Presence of right artificial knee joint: Secondary | ICD-10-CM | POA: Diagnosis not present

## 2015-06-12 DIAGNOSIS — Z9181 History of falling: Secondary | ICD-10-CM | POA: Diagnosis not present

## 2015-06-12 DIAGNOSIS — S82201G Unspecified fracture of shaft of right tibia, subsequent encounter for closed fracture with delayed healing: Secondary | ICD-10-CM | POA: Diagnosis not present

## 2015-06-12 DIAGNOSIS — D649 Anemia, unspecified: Secondary | ICD-10-CM | POA: Diagnosis not present

## 2015-06-12 DIAGNOSIS — I5020 Unspecified systolic (congestive) heart failure: Secondary | ICD-10-CM | POA: Diagnosis not present

## 2015-06-12 DIAGNOSIS — Z794 Long term (current) use of insulin: Secondary | ICD-10-CM | POA: Diagnosis not present

## 2015-06-12 DIAGNOSIS — Z951 Presence of aortocoronary bypass graft: Secondary | ICD-10-CM | POA: Diagnosis not present

## 2015-06-12 DIAGNOSIS — M810 Age-related osteoporosis without current pathological fracture: Secondary | ICD-10-CM | POA: Diagnosis not present

## 2015-06-12 DIAGNOSIS — Z7982 Long term (current) use of aspirin: Secondary | ICD-10-CM | POA: Diagnosis not present

## 2015-06-12 DIAGNOSIS — E119 Type 2 diabetes mellitus without complications: Secondary | ICD-10-CM | POA: Diagnosis not present

## 2015-06-12 DIAGNOSIS — M1990 Unspecified osteoarthritis, unspecified site: Secondary | ICD-10-CM | POA: Diagnosis not present

## 2015-06-12 DIAGNOSIS — J45909 Unspecified asthma, uncomplicated: Secondary | ICD-10-CM | POA: Diagnosis not present

## 2015-06-13 DIAGNOSIS — Z96651 Presence of right artificial knee joint: Secondary | ICD-10-CM | POA: Diagnosis not present

## 2015-06-13 DIAGNOSIS — Z794 Long term (current) use of insulin: Secondary | ICD-10-CM | POA: Diagnosis not present

## 2015-06-13 DIAGNOSIS — Z7982 Long term (current) use of aspirin: Secondary | ICD-10-CM | POA: Diagnosis not present

## 2015-06-13 DIAGNOSIS — Z9181 History of falling: Secondary | ICD-10-CM | POA: Diagnosis not present

## 2015-06-13 DIAGNOSIS — D649 Anemia, unspecified: Secondary | ICD-10-CM | POA: Diagnosis not present

## 2015-06-13 DIAGNOSIS — J45909 Unspecified asthma, uncomplicated: Secondary | ICD-10-CM | POA: Diagnosis not present

## 2015-06-13 DIAGNOSIS — S82201G Unspecified fracture of shaft of right tibia, subsequent encounter for closed fracture with delayed healing: Secondary | ICD-10-CM | POA: Diagnosis not present

## 2015-06-13 DIAGNOSIS — E119 Type 2 diabetes mellitus without complications: Secondary | ICD-10-CM | POA: Diagnosis not present

## 2015-06-13 DIAGNOSIS — M810 Age-related osteoporosis without current pathological fracture: Secondary | ICD-10-CM | POA: Diagnosis not present

## 2015-06-13 DIAGNOSIS — Z951 Presence of aortocoronary bypass graft: Secondary | ICD-10-CM | POA: Diagnosis not present

## 2015-06-13 DIAGNOSIS — M1990 Unspecified osteoarthritis, unspecified site: Secondary | ICD-10-CM | POA: Diagnosis not present

## 2015-06-13 DIAGNOSIS — I5020 Unspecified systolic (congestive) heart failure: Secondary | ICD-10-CM | POA: Diagnosis not present

## 2015-06-13 DIAGNOSIS — I2510 Atherosclerotic heart disease of native coronary artery without angina pectoris: Secondary | ICD-10-CM | POA: Diagnosis not present

## 2015-06-14 DIAGNOSIS — Z951 Presence of aortocoronary bypass graft: Secondary | ICD-10-CM | POA: Diagnosis not present

## 2015-06-14 DIAGNOSIS — Z9181 History of falling: Secondary | ICD-10-CM | POA: Diagnosis not present

## 2015-06-14 DIAGNOSIS — S82201G Unspecified fracture of shaft of right tibia, subsequent encounter for closed fracture with delayed healing: Secondary | ICD-10-CM | POA: Diagnosis not present

## 2015-06-14 DIAGNOSIS — M1990 Unspecified osteoarthritis, unspecified site: Secondary | ICD-10-CM | POA: Diagnosis not present

## 2015-06-14 DIAGNOSIS — Z7982 Long term (current) use of aspirin: Secondary | ICD-10-CM | POA: Diagnosis not present

## 2015-06-14 DIAGNOSIS — Z794 Long term (current) use of insulin: Secondary | ICD-10-CM | POA: Diagnosis not present

## 2015-06-14 DIAGNOSIS — D649 Anemia, unspecified: Secondary | ICD-10-CM | POA: Diagnosis not present

## 2015-06-14 DIAGNOSIS — J45909 Unspecified asthma, uncomplicated: Secondary | ICD-10-CM | POA: Diagnosis not present

## 2015-06-14 DIAGNOSIS — M810 Age-related osteoporosis without current pathological fracture: Secondary | ICD-10-CM | POA: Diagnosis not present

## 2015-06-14 DIAGNOSIS — I2510 Atherosclerotic heart disease of native coronary artery without angina pectoris: Secondary | ICD-10-CM | POA: Diagnosis not present

## 2015-06-14 DIAGNOSIS — E119 Type 2 diabetes mellitus without complications: Secondary | ICD-10-CM | POA: Diagnosis not present

## 2015-06-14 DIAGNOSIS — I5020 Unspecified systolic (congestive) heart failure: Secondary | ICD-10-CM | POA: Diagnosis not present

## 2015-06-14 DIAGNOSIS — Z96651 Presence of right artificial knee joint: Secondary | ICD-10-CM | POA: Diagnosis not present

## 2015-06-17 DIAGNOSIS — I5020 Unspecified systolic (congestive) heart failure: Secondary | ICD-10-CM | POA: Diagnosis not present

## 2015-06-17 DIAGNOSIS — E119 Type 2 diabetes mellitus without complications: Secondary | ICD-10-CM | POA: Diagnosis not present

## 2015-06-17 DIAGNOSIS — M810 Age-related osteoporosis without current pathological fracture: Secondary | ICD-10-CM | POA: Diagnosis not present

## 2015-06-17 DIAGNOSIS — M1990 Unspecified osteoarthritis, unspecified site: Secondary | ICD-10-CM | POA: Diagnosis not present

## 2015-06-17 DIAGNOSIS — Z96651 Presence of right artificial knee joint: Secondary | ICD-10-CM | POA: Diagnosis not present

## 2015-06-17 DIAGNOSIS — S82201G Unspecified fracture of shaft of right tibia, subsequent encounter for closed fracture with delayed healing: Secondary | ICD-10-CM | POA: Diagnosis not present

## 2015-06-17 DIAGNOSIS — J45909 Unspecified asthma, uncomplicated: Secondary | ICD-10-CM | POA: Diagnosis not present

## 2015-06-17 DIAGNOSIS — Z7982 Long term (current) use of aspirin: Secondary | ICD-10-CM | POA: Diagnosis not present

## 2015-06-17 DIAGNOSIS — Z794 Long term (current) use of insulin: Secondary | ICD-10-CM | POA: Diagnosis not present

## 2015-06-17 DIAGNOSIS — Z9181 History of falling: Secondary | ICD-10-CM | POA: Diagnosis not present

## 2015-06-17 DIAGNOSIS — I2510 Atherosclerotic heart disease of native coronary artery without angina pectoris: Secondary | ICD-10-CM | POA: Diagnosis not present

## 2015-06-17 DIAGNOSIS — D649 Anemia, unspecified: Secondary | ICD-10-CM | POA: Diagnosis not present

## 2015-06-17 DIAGNOSIS — Z951 Presence of aortocoronary bypass graft: Secondary | ICD-10-CM | POA: Diagnosis not present

## 2015-06-20 DIAGNOSIS — D649 Anemia, unspecified: Secondary | ICD-10-CM | POA: Diagnosis not present

## 2015-06-20 DIAGNOSIS — I2510 Atherosclerotic heart disease of native coronary artery without angina pectoris: Secondary | ICD-10-CM | POA: Diagnosis not present

## 2015-06-20 DIAGNOSIS — J45909 Unspecified asthma, uncomplicated: Secondary | ICD-10-CM | POA: Diagnosis not present

## 2015-06-20 DIAGNOSIS — I5020 Unspecified systolic (congestive) heart failure: Secondary | ICD-10-CM | POA: Diagnosis not present

## 2015-06-20 DIAGNOSIS — M1990 Unspecified osteoarthritis, unspecified site: Secondary | ICD-10-CM | POA: Diagnosis not present

## 2015-06-20 DIAGNOSIS — Z9181 History of falling: Secondary | ICD-10-CM | POA: Diagnosis not present

## 2015-06-20 DIAGNOSIS — Z96651 Presence of right artificial knee joint: Secondary | ICD-10-CM | POA: Diagnosis not present

## 2015-06-20 DIAGNOSIS — Z794 Long term (current) use of insulin: Secondary | ICD-10-CM | POA: Diagnosis not present

## 2015-06-20 DIAGNOSIS — E119 Type 2 diabetes mellitus without complications: Secondary | ICD-10-CM | POA: Diagnosis not present

## 2015-06-20 DIAGNOSIS — Z7982 Long term (current) use of aspirin: Secondary | ICD-10-CM | POA: Diagnosis not present

## 2015-06-20 DIAGNOSIS — Z951 Presence of aortocoronary bypass graft: Secondary | ICD-10-CM | POA: Diagnosis not present

## 2015-06-20 DIAGNOSIS — M810 Age-related osteoporosis without current pathological fracture: Secondary | ICD-10-CM | POA: Diagnosis not present

## 2015-06-20 DIAGNOSIS — S82201G Unspecified fracture of shaft of right tibia, subsequent encounter for closed fracture with delayed healing: Secondary | ICD-10-CM | POA: Diagnosis not present

## 2015-06-21 ENCOUNTER — Telehealth: Payer: Self-pay

## 2015-06-21 NOTE — Telephone Encounter (Signed)
Very sorry, but humalog does not do this, and will very likely be denied if a PA is done  OK to continue Humalog

## 2015-06-21 NOTE — Telephone Encounter (Signed)
Pt dropped of a note stating that she "cannot take Humalog because it almost caused me to go blind when they gave it to me in the hospital". Pt is requesting an alternative insulin to [facility] order, please advise. Pt's medication list has multiple insulins

## 2015-06-24 DIAGNOSIS — Z48812 Encounter for surgical aftercare following surgery on the circulatory system: Secondary | ICD-10-CM | POA: Diagnosis not present

## 2015-06-24 NOTE — Telephone Encounter (Signed)
LVM for pt to call back as soon as possible. \  RE: MD response below.  

## 2015-06-25 DIAGNOSIS — I5020 Unspecified systolic (congestive) heart failure: Secondary | ICD-10-CM | POA: Diagnosis not present

## 2015-06-25 DIAGNOSIS — S82201G Unspecified fracture of shaft of right tibia, subsequent encounter for closed fracture with delayed healing: Secondary | ICD-10-CM | POA: Diagnosis not present

## 2015-06-25 DIAGNOSIS — M1990 Unspecified osteoarthritis, unspecified site: Secondary | ICD-10-CM | POA: Diagnosis not present

## 2015-06-25 DIAGNOSIS — D649 Anemia, unspecified: Secondary | ICD-10-CM | POA: Diagnosis not present

## 2015-06-25 DIAGNOSIS — I2510 Atherosclerotic heart disease of native coronary artery without angina pectoris: Secondary | ICD-10-CM | POA: Diagnosis not present

## 2015-06-25 DIAGNOSIS — Z794 Long term (current) use of insulin: Secondary | ICD-10-CM | POA: Diagnosis not present

## 2015-06-25 DIAGNOSIS — Z96651 Presence of right artificial knee joint: Secondary | ICD-10-CM | POA: Diagnosis not present

## 2015-06-25 DIAGNOSIS — M810 Age-related osteoporosis without current pathological fracture: Secondary | ICD-10-CM | POA: Diagnosis not present

## 2015-06-25 DIAGNOSIS — E119 Type 2 diabetes mellitus without complications: Secondary | ICD-10-CM | POA: Diagnosis not present

## 2015-06-25 DIAGNOSIS — Z951 Presence of aortocoronary bypass graft: Secondary | ICD-10-CM | POA: Diagnosis not present

## 2015-06-25 DIAGNOSIS — Z7982 Long term (current) use of aspirin: Secondary | ICD-10-CM | POA: Diagnosis not present

## 2015-06-25 DIAGNOSIS — J45909 Unspecified asthma, uncomplicated: Secondary | ICD-10-CM | POA: Diagnosis not present

## 2015-06-25 DIAGNOSIS — Z9181 History of falling: Secondary | ICD-10-CM | POA: Diagnosis not present

## 2015-06-26 NOTE — Telephone Encounter (Signed)
Sorry, this just isnt possible, and is not a good reason for change of medication, which will require time, effort and cost for staff to pursue this change which is not needed  I will not accept her request  I understand if she wants to switch MD

## 2015-06-26 NOTE — Telephone Encounter (Signed)
Patient called back. Advised of the below. 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   She wishes to continue novalog.   i advised that i would pass this information along.

## 2015-06-28 DIAGNOSIS — Z96651 Presence of right artificial knee joint: Secondary | ICD-10-CM | POA: Diagnosis not present

## 2015-06-28 DIAGNOSIS — D649 Anemia, unspecified: Secondary | ICD-10-CM | POA: Diagnosis not present

## 2015-06-28 DIAGNOSIS — Z9181 History of falling: Secondary | ICD-10-CM | POA: Diagnosis not present

## 2015-06-28 DIAGNOSIS — E119 Type 2 diabetes mellitus without complications: Secondary | ICD-10-CM | POA: Diagnosis not present

## 2015-06-28 DIAGNOSIS — S82201G Unspecified fracture of shaft of right tibia, subsequent encounter for closed fracture with delayed healing: Secondary | ICD-10-CM | POA: Diagnosis not present

## 2015-06-28 DIAGNOSIS — Z794 Long term (current) use of insulin: Secondary | ICD-10-CM | POA: Diagnosis not present

## 2015-06-28 DIAGNOSIS — Z951 Presence of aortocoronary bypass graft: Secondary | ICD-10-CM | POA: Diagnosis not present

## 2015-06-28 DIAGNOSIS — I5020 Unspecified systolic (congestive) heart failure: Secondary | ICD-10-CM | POA: Diagnosis not present

## 2015-06-28 DIAGNOSIS — M1990 Unspecified osteoarthritis, unspecified site: Secondary | ICD-10-CM | POA: Diagnosis not present

## 2015-06-28 DIAGNOSIS — Z7982 Long term (current) use of aspirin: Secondary | ICD-10-CM | POA: Diagnosis not present

## 2015-06-28 DIAGNOSIS — I2510 Atherosclerotic heart disease of native coronary artery without angina pectoris: Secondary | ICD-10-CM | POA: Diagnosis not present

## 2015-06-28 DIAGNOSIS — M810 Age-related osteoporosis without current pathological fracture: Secondary | ICD-10-CM | POA: Diagnosis not present

## 2015-06-28 DIAGNOSIS — J45909 Unspecified asthma, uncomplicated: Secondary | ICD-10-CM | POA: Diagnosis not present

## 2015-07-01 NOTE — Telephone Encounter (Signed)
LVM for pt to call back as soon as possible.   

## 2015-07-02 DIAGNOSIS — D649 Anemia, unspecified: Secondary | ICD-10-CM | POA: Diagnosis not present

## 2015-07-02 DIAGNOSIS — M1990 Unspecified osteoarthritis, unspecified site: Secondary | ICD-10-CM | POA: Diagnosis not present

## 2015-07-02 DIAGNOSIS — M810 Age-related osteoporosis without current pathological fracture: Secondary | ICD-10-CM | POA: Diagnosis not present

## 2015-07-02 DIAGNOSIS — Z951 Presence of aortocoronary bypass graft: Secondary | ICD-10-CM | POA: Diagnosis not present

## 2015-07-02 DIAGNOSIS — Z794 Long term (current) use of insulin: Secondary | ICD-10-CM | POA: Diagnosis not present

## 2015-07-02 DIAGNOSIS — J45909 Unspecified asthma, uncomplicated: Secondary | ICD-10-CM | POA: Diagnosis not present

## 2015-07-02 DIAGNOSIS — E119 Type 2 diabetes mellitus without complications: Secondary | ICD-10-CM | POA: Diagnosis not present

## 2015-07-02 DIAGNOSIS — Z7982 Long term (current) use of aspirin: Secondary | ICD-10-CM | POA: Diagnosis not present

## 2015-07-02 DIAGNOSIS — S82201G Unspecified fracture of shaft of right tibia, subsequent encounter for closed fracture with delayed healing: Secondary | ICD-10-CM | POA: Diagnosis not present

## 2015-07-02 DIAGNOSIS — Z96651 Presence of right artificial knee joint: Secondary | ICD-10-CM | POA: Diagnosis not present

## 2015-07-02 DIAGNOSIS — I2510 Atherosclerotic heart disease of native coronary artery without angina pectoris: Secondary | ICD-10-CM | POA: Diagnosis not present

## 2015-07-02 DIAGNOSIS — I5020 Unspecified systolic (congestive) heart failure: Secondary | ICD-10-CM | POA: Diagnosis not present

## 2015-07-02 DIAGNOSIS — Z9181 History of falling: Secondary | ICD-10-CM | POA: Diagnosis not present

## 2015-07-02 MED ORDER — INSULIN ASPART 100 UNIT/ML FLEXPEN: 15.0000 [IU] | PEN_INJECTOR | Freq: Every day | SUBCUTANEOUS | Status: DC

## 2015-07-02 NOTE — Addendum Note (Signed)
Addended by: Karle Barr on: 07/02/2015 02:42 PM   Modules accepted: Orders

## 2015-07-02 NOTE — Telephone Encounter (Signed)
According to med list pt is currently on Novolog and has been since 04/2015.   Pt stated that Humalog was given in the hospital during right tibia surgery.   Sending in refill for the active Novolog insulin per pt request.

## 2015-07-02 NOTE — Telephone Encounter (Signed)
[phone] patient can be reached at

## 2015-07-02 NOTE — Telephone Encounter (Signed)
Patient states she is on novolog right now and will not take humalog.  I did give her MD response.  Can you follow up with.

## 2015-07-05 DIAGNOSIS — M1990 Unspecified osteoarthritis, unspecified site: Secondary | ICD-10-CM | POA: Diagnosis not present

## 2015-07-05 DIAGNOSIS — Z96651 Presence of right artificial knee joint: Secondary | ICD-10-CM | POA: Diagnosis not present

## 2015-07-05 DIAGNOSIS — Z951 Presence of aortocoronary bypass graft: Secondary | ICD-10-CM | POA: Diagnosis not present

## 2015-07-05 DIAGNOSIS — D649 Anemia, unspecified: Secondary | ICD-10-CM | POA: Diagnosis not present

## 2015-07-05 DIAGNOSIS — Z7982 Long term (current) use of aspirin: Secondary | ICD-10-CM | POA: Diagnosis not present

## 2015-07-05 DIAGNOSIS — I2510 Atherosclerotic heart disease of native coronary artery without angina pectoris: Secondary | ICD-10-CM | POA: Diagnosis not present

## 2015-07-05 DIAGNOSIS — I5020 Unspecified systolic (congestive) heart failure: Secondary | ICD-10-CM | POA: Diagnosis not present

## 2015-07-05 DIAGNOSIS — S82201G Unspecified fracture of shaft of right tibia, subsequent encounter for closed fracture with delayed healing: Secondary | ICD-10-CM | POA: Diagnosis not present

## 2015-07-05 DIAGNOSIS — Z794 Long term (current) use of insulin: Secondary | ICD-10-CM | POA: Diagnosis not present

## 2015-07-05 DIAGNOSIS — J45909 Unspecified asthma, uncomplicated: Secondary | ICD-10-CM | POA: Diagnosis not present

## 2015-07-05 DIAGNOSIS — M810 Age-related osteoporosis without current pathological fracture: Secondary | ICD-10-CM | POA: Diagnosis not present

## 2015-07-05 DIAGNOSIS — Z9181 History of falling: Secondary | ICD-10-CM | POA: Diagnosis not present

## 2015-07-05 DIAGNOSIS — E119 Type 2 diabetes mellitus without complications: Secondary | ICD-10-CM | POA: Diagnosis not present

## 2015-07-23 DIAGNOSIS — Z96651 Presence of right artificial knee joint: Secondary | ICD-10-CM | POA: Diagnosis not present

## 2015-07-23 DIAGNOSIS — Z886 Allergy status to analgesic agent status: Secondary | ICD-10-CM | POA: Diagnosis not present

## 2015-07-23 DIAGNOSIS — S82401K Unspecified fracture of shaft of right fibula, subsequent encounter for closed fracture with nonunion: Secondary | ICD-10-CM | POA: Diagnosis not present

## 2015-07-23 DIAGNOSIS — Z9889 Other specified postprocedural states: Secondary | ICD-10-CM | POA: Diagnosis not present

## 2015-07-23 DIAGNOSIS — I2510 Atherosclerotic heart disease of native coronary artery without angina pectoris: Secondary | ICD-10-CM | POA: Diagnosis not present

## 2015-07-23 DIAGNOSIS — Z951 Presence of aortocoronary bypass graft: Secondary | ICD-10-CM | POA: Diagnosis not present

## 2015-07-23 DIAGNOSIS — I5020 Unspecified systolic (congestive) heart failure: Secondary | ICD-10-CM | POA: Diagnosis not present

## 2015-07-23 DIAGNOSIS — S82201K Unspecified fracture of shaft of right tibia, subsequent encounter for closed fracture with nonunion: Secondary | ICD-10-CM | POA: Diagnosis not present

## 2015-07-23 DIAGNOSIS — E785 Hyperlipidemia, unspecified: Secondary | ICD-10-CM | POA: Diagnosis not present

## 2015-07-23 DIAGNOSIS — E119 Type 2 diabetes mellitus without complications: Secondary | ICD-10-CM | POA: Diagnosis not present

## 2015-07-23 DIAGNOSIS — J45909 Unspecified asthma, uncomplicated: Secondary | ICD-10-CM | POA: Diagnosis not present

## 2015-07-23 DIAGNOSIS — Z888 Allergy status to other drugs, medicaments and biological substances status: Secondary | ICD-10-CM | POA: Diagnosis not present

## 2015-07-24 DIAGNOSIS — Z48812 Encounter for surgical aftercare following surgery on the circulatory system: Secondary | ICD-10-CM | POA: Diagnosis not present

## 2015-08-01 ENCOUNTER — Other Ambulatory Visit: Payer: Self-pay | Admitting: Internal Medicine

## 2015-08-06 ENCOUNTER — Telehealth: Payer: Self-pay | Admitting: *Deleted

## 2015-08-06 MED ORDER — INSULIN GLARGINE 100 UNIT/ML SOLOSTAR PEN: PEN_INJECTOR | SUBCUTANEOUS | Status: DC

## 2015-08-06 NOTE — Telephone Encounter (Signed)
Received call pt states she os needing refill on her Lantus Solarstar pen. Verified pharmacy inform will send to [facility]...[name]

## 2015-08-09 ENCOUNTER — Other Ambulatory Visit (INDEPENDENT_AMBULATORY_CARE_PROVIDER_SITE_OTHER): Payer: Commercial Managed Care - HMO

## 2015-08-09 ENCOUNTER — Ambulatory Visit (INDEPENDENT_AMBULATORY_CARE_PROVIDER_SITE_OTHER): Payer: Commercial Managed Care - HMO | Admitting: Internal Medicine

## 2015-08-09 VITALS — BP 124/64 | HR 80 | Temp 97.5°F

## 2015-08-09 DIAGNOSIS — E785 Hyperlipidemia, unspecified: Secondary | ICD-10-CM | POA: Diagnosis not present

## 2015-08-09 DIAGNOSIS — I10 Essential (primary) hypertension: Secondary | ICD-10-CM

## 2015-08-09 DIAGNOSIS — E119 Type 2 diabetes mellitus without complications: Secondary | ICD-10-CM

## 2015-08-09 DIAGNOSIS — Z0000 Encounter for general adult medical examination without abnormal findings: Secondary | ICD-10-CM | POA: Diagnosis not present

## 2015-08-09 DIAGNOSIS — D649 Anemia, unspecified: Secondary | ICD-10-CM | POA: Diagnosis not present

## 2015-08-09 LAB — LIPID PANEL
CHOL/HDL RATIO: 4
Cholesterol: 200 mg/dL (ref 0–200)
HDL: 50.4 mg/dL (ref >39.00)
LDL CALC: 129 mg/dL — AB (ref 0–99)
NONHDL: 149.5
TRIGLYCERIDES: 101 mg/dL (ref 0.0–149.0)
VLDL: 20.2 mg/dL (ref 0.0–40.0)

## 2015-08-09 LAB — HEPATIC FUNCTION PANEL
ALT: 12 U/L (ref 0–35)
AST: 13 U/L (ref 0–37)
Albumin: 3.6 g/dL (ref 3.5–5.2)
Alkaline Phosphatase: 95 U/L (ref 39–117)
BILIRUBIN TOTAL: 0.2 mg/dL (ref 0.2–1.2)
Bilirubin, Direct: 0.1 mg/dL (ref 0.0–0.3)
Total Protein: 7.2 g/dL (ref 6.0–8.3)

## 2015-08-09 LAB — URINALYSIS, ROUTINE W REFLEX MICROSCOPIC
Bilirubin Urine: NEGATIVE
HGB URINE DIPSTICK: NEGATIVE
KETONES UR: NEGATIVE
Leukocytes, UA: NEGATIVE
NITRITE: NEGATIVE
Specific Gravity, Urine: 1.015 (ref 1.000–1.030)
Total Protein, Urine: NEGATIVE
URINE GLUCOSE: NEGATIVE
Urobilinogen, UA: 0.2 (ref 0.0–1.0)
pH: 6 (ref 5.0–8.0)

## 2015-08-09 LAB — MICROALBUMIN / CREATININE URINE RATIO
CREATININE, U: 90.1 mg/dL
MICROALB UR: 0.8 mg/dL (ref 0.0–1.9)
MICROALB/CREAT RATIO: 0.9 mg/g (ref 0.0–30.0)

## 2015-08-09 LAB — CBC WITH DIFFERENTIAL/PLATELET
BASOS PCT: 0.6 % (ref 0.0–3.0)
Basophils Absolute: 0 10*3/uL (ref 0.0–0.1)
EOS ABS: 0.3 10*3/uL (ref 0.0–0.7)
EOS PCT: 5.3 % — AB (ref 0.0–5.0)
HEMATOCRIT: 33.9 % — AB (ref 36.0–46.0)
HEMOGLOBIN: 11.1 g/dL — AB (ref 12.0–15.0)
LYMPHS PCT: 21.3 % (ref 12.0–46.0)
Lymphs Abs: 1.2 10*3/uL (ref 0.7–4.0)
MCHC: 32.8 g/dL (ref 30.0–36.0)
MCV: 91.1 fl (ref 78.0–100.0)
MONOS PCT: 8.7 % (ref 3.0–12.0)
Monocytes Absolute: 0.5 10*3/uL (ref 0.1–1.0)
Neutro Abs: 3.7 10*3/uL (ref 1.4–7.7)
Neutrophils Relative %: 64.1 % (ref 43.0–77.0)
Platelets: 192 10*3/uL (ref 150.0–400.0)
RBC: 3.72 Mil/uL — ABNORMAL LOW (ref 3.87–5.11)
RDW: 15 % (ref 11.5–15.5)
WBC: 5.7 10*3/uL (ref 4.0–10.5)

## 2015-08-09 LAB — BASIC METABOLIC PANEL
BUN: 54 mg/dL — ABNORMAL HIGH (ref 6–23)
CALCIUM: 10 mg/dL (ref 8.4–10.5)
CO2: 29 mEq/L (ref 19–32)
CREATININE: 1.35 mg/dL — AB (ref 0.40–1.20)
Chloride: 105 mEq/L (ref 96–112)
GFR: 49.21 mL/min — AB (ref >60.00)
Glucose, Bld: 237 mg/dL — ABNORMAL HIGH (ref 70–99)
Potassium: 5 mEq/L (ref 3.5–5.1)
Sodium: 138 mEq/L (ref 135–145)

## 2015-08-09 LAB — TSH: TSH: 2.09 u[IU]/mL (ref 0.35–4.50)

## 2015-08-09 LAB — HEMOGLOBIN A1C: Hgb A1c MFr Bld: 7.1 % — ABNORMAL HIGH (ref 4.6–6.5)

## 2015-08-09 MED ORDER — ALBUTEROL SULFATE HFA 108 (90 BASE) MCG/ACT IN AERS: 2.0000 | INHALATION_SPRAY | Freq: Two times a day (BID) | RESPIRATORY_TRACT | Status: DC | PRN

## 2015-08-09 MED ORDER — ROSUVASTATIN CALCIUM 20 MG PO TABS: 10.0000 mg | ORAL_TABLET | Freq: Every day | ORAL | Status: DC

## 2015-08-09 MED ORDER — ONETOUCH LANCETS MISC: Status: DC

## 2015-08-09 MED ORDER — INSULIN PEN NEEDLE 31G X 8 MM MISC: Status: DC

## 2015-08-09 MED ORDER — DONEPEZIL HCL 5 MG PO TABS: 5.0000 mg | ORAL_TABLET | Freq: Every day | ORAL | Status: DC

## 2015-08-09 NOTE — Progress Notes (Signed)
Subjective:    Patient ID: Holly Weiss, female    DOB: [birth year], 75 y.o.   MRN: [id number]  HPI  Here for wellness and f/u;  Overall doing ok;  Pt denies Chest pain, worsening SOB, DOE, wheezing, orthopnea, PND, worsening LE edema, palpitations, dizziness or syncope.  Pt denies neurological change such as new headache, facial or extremity weakness.  Pt denies polydipsia, polyuria, or low sugar symptoms. Pt states overall good compliance with treatment and medications, good tolerability, and has been trying to follow appropriate diet.  Pt denies worsening depressive symptoms, suicidal ideation or panic. No fever, night sweats, wt loss, loss of appetite, or other constitutional symptoms.  Pt states good ability with ADL's, has low fall risk, home safety reviewed and adequate, no other significant changes in hearing or vision, and only occasionally active with exercise.S  /ps right mid leg surgury per ortho [facility] in Nov, still not walking, seen last in f/u dec 20, and not clear if will walk again, for f/u again feb 20.  Was getting PT, but left knee pain and swelling happened, so had to stop.  Has known chronic anemia, then given 2 or 3 units PRBC post op.Declines further aricept due to cost, but family wants to cont. Past Medical History  Diagnosis Date  . ANEMIA-NOS 01/18/2008  . 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 ASTHMA, WITH ACUTE EXACERBATION 11/29/2008  . Chronic pain syndrome 01/21/2010  . DIABETIC  RETINOPATHY 05/30/2007  . DM, UNCOMPLICATED, TYPE II, UNCONTROLLED 05/30/2007  . 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 HYPERLIPIDEMIA 05/30/2007  . HYPERTENSION 10/31/2007  . LOW BACK PAIN 01/18/2008  . MORBID OBESITY, HX OF 05/30/2007  . NEPHROLITHIASIS, HX OF 01/18/2008  . SHOULDER PAIN, BILATERAL 04/09/2009  . Migraine headache   . Spinal stenosis   . Bilateral carpal tunnel syndrome   . Shortness of breath     with walking  . Urgency of urination   . Arthritis   . Edema of both legs     takes  Lasix  . Numbness and tingling in hands   . GERD (gastroesophageal reflux disease)   . Femur fracture, right   . Seasonal allergies   . History of kidney stones   . Depression   . History of blood transfusion     spine surgery   Past Surgical History  Procedure Laterality Date  . Cataract extraction    . Cholecystectomy    . Thoracic fusion    . Lumbar disc surgury    . Tubal ligation    . Back surgery    . Total knee arthroplasty with revision components Right 07/12/2013    Procedure: TOTAL KNEE ARTHROPLASTY WITH TIBIA REVISION COMPONENTS;  Surgeon: Ninetta Lights, MD;  Location: [place];  Service: Orthopedics;  Laterality: Right;  . Orif tibia fracture Right 01/16/2014    Procedure: RIGHT TIBIA REPAIR NON-UNION/MALUNION TIBIA WITH SLIDING GRAFT;  Surgeon: Renette Butters, MD;  Location: [place];  Service: Orthopedics;  Laterality: Right;    reports that she has never smoked. She does not have any smokeless tobacco history on file. She reports that she does not drink alcohol or use illicit drugs. family history includes Dementia in her mother; Depression in her sister; Diabetes in her mother and sister; Heart disease in her father and mother; Lung cancer in her father. Allergies  Allergen Reactions  . Adhesive [Tape] Itching  . Endocet [Oxycodone-Acetaminophen] Itching  . Latex Itching   Current Outpatient Prescriptions on File  Prior to Visit  Medication Sig Dispense Refill  . aspirin 81 MG tablet Take 1 tablet (81 mg total) by mouth daily. 30 tablet 0  . Blood Glucose Monitoring Suppl (ONE TOUCH BASIC SYSTEM) W/DEVICE KIT Use as directed once daily to check blood sugar.  Diagnosis code 250.02 1 each 0  . Calcium Carbonate-Vitamin D (CALTRATE 600+D PO) Take 2 tablets by mouth daily.    Marland Kitchen donepezil (ARICEPT) 5 MG tablet TAKE 1 TABLET BY MOUTH EVERY NIGHT AT BEDTIME 30 tablet 5  . glucose blood (ONE TOUCH TEST STRIPS) test strip Use as directed once daily to check blood sugar.   Diagnosis code E11.9 100 each 11  . insulin aspart (NOVOLOG FLEXPEN) 100 UNIT/ML FlexPen Inject 15 Units into the skin daily. 15 mL 3  . Insulin Glargine (LANTUS SOLOSTAR) 100 UNIT/ML Solostar Pen INJECT 45 UNITS INTO THE SKIN EVERY DAY 15 mL 1  . lisinopril (PRINIVIL,ZESTRIL) 5 MG tablet Take 5 mg by mouth daily.    . Magnesium 250 MG TABS Take 250 mg by mouth daily.    . Multiple Vitamins-Minerals (ONE-A-DAY EXTRAS ANTIOXIDANT PO) Take 1 tablet by mouth daily.     Marland Kitchen tiZANidine (ZANAFLEX) 4 MG tablet Take 1 tablet (4 mg total) by mouth every 6 (six) hours as needed for muscle spasms. 30 tablet 2  . furosemide (LASIX) 40 MG tablet Take 0.5 tablets (20 mg total) by mouth daily. (Patient not taking: Reported on 08/09/2015) 30 tablet 11  . metoprolol succinate (TOPROL XL) 100 MG 24 hr tablet Take 50 mg by mouth.     No current facility-administered medications on file prior to visit.      Review of Systems Constitutional: Negative for increased diaphoresis, other activity, appetite or siginficant weight change other than noted HENT: Negative for worsening hearing loss, ear pain, facial swelling, mouth sores and neck stiffness.   Eyes: Negative for other worsening pain, redness or visual disturbance.  Respiratory: Negative for shortness of breath and wheezing  Cardiovascular: Negative for chest pain and palpitations.  Gastrointestinal: Negative for diarrhea, blood in stool, abdominal distention or other pain Genitourinary: Negative for hematuria, flank pain or change in urine volume.  Musculoskeletal: Negative for myalgias or other joint complaints.  Skin: Negative for color change and wound or drainage.  Neurological: Negative for syncope and numbness. other than noted Hematological: Negative for adenopathy. or other swelling Psychiatric/Behavioral: Negative for hallucinations, SI, self-injury, decreased concentration or other worsening agitation.      Objective:   Physical Exam BP 124/64  mmHg  Pulse 80  Temp(Src) 97.5 F (36.4 C) (Oral)  Ht   Wt  BP 124/64 mmHg  Pulse 80  Temp(Src) 97.5 F (36.4 C) (Oral)  Ht   Wt  VS noted,  Constitutional: Pt is oriented to person, place, and time. Appears well-developed and well-nourished, in no significant distress Head: Normocephalic and atraumatic.  Right Ear: External ear normal.  Left Ear: External ear normal.  Nose: Nose normal.  Mouth/Throat: Oropharynx is clear and moist.  Eyes: Conjunctivae and EOM are normal. Pupils are equal, round, and reactive to light.  Neck: Normal range of motion. Neck supple. No JVD present. No tracheal deviation present or significant neck LA or mass Cardiovascular: Normal rate, regular rhythm, normal heart sounds and intact distal pulses.   Pulmonary/Chest: Effort normal and breath sounds without rales or wheezing  Abdominal: Soft. Bowel sounds are normal. NT. No HSM  Musculoskeletal: Normal range of motion. Exhibits no edema.  Lymphadenopathy:  Has no cervical adenopathy.  Neurological: Pt is alert and oriented to person, place, and time. Pt has normal reflexes. No cranial nerve deficit. Motor grossly intact Skin: Skin is warm and dry. No rash noted.  Psychiatric:  Has normal mood and affect. Behavior is normal.        Assessment & Plan:

## 2015-08-09 NOTE — Assessment & Plan Note (Signed)
stable overall by history and exam, recent data reviewed with pt, and pt to continue medical treatment as before,  to f/u any worsening symptoms or concerns Lab Results  Component Value Date   HGBA1C 6.4 01/30/2015

## 2015-08-09 NOTE — Progress Notes (Signed)
Pre visit review using our clinic review tool, if applicable. No additional management support is needed unless otherwise documented below in the visit note. 

## 2015-08-09 NOTE — Assessment & Plan Note (Signed)
stable overall by history and exam, recent data reviewed with pt, and pt to continue medical treatment as before,  to f/u any worsening symptoms or concerns Lab Results  Component Value Date   LDLCALC 88 01/30/2015   Goal ldl < 70, cont lower chol diet

## 2015-08-09 NOTE — Assessment & Plan Note (Signed)
S/p recent transfusion, no overt blood loss, for /fu cbc with labs

## 2015-08-09 NOTE — Assessment & Plan Note (Signed)

## 2015-08-09 NOTE — Assessment & Plan Note (Signed)
stable overall by history and exam, recent data reviewed with pt, and pt to continue medical treatment as before,  to f/u any worsening symptoms or concerns BP Readings from Last 3 Encounters:  08/09/15 124/64  01/30/15 142/86  08/01/14 128/62

## 2015-08-09 NOTE — Patient Instructions (Signed)

## 2015-08-26 ENCOUNTER — Encounter: Payer: Self-pay | Admitting: Internal Medicine

## 2015-10-01 ENCOUNTER — Telehealth: Payer: Self-pay

## 2015-10-01 ENCOUNTER — Telehealth: Payer: Self-pay | Admitting: Internal Medicine

## 2015-10-01 ENCOUNTER — Other Ambulatory Visit: Payer: Self-pay | Admitting: Internal Medicine

## 2015-10-01 MED ORDER — TIZANIDINE HCL 4 MG PO TABS: 4.0000 mg | ORAL_TABLET | Freq: Three times a day (TID) | ORAL | Status: DC | PRN

## 2015-10-01 NOTE — Addendum Note (Signed)
Addended by: Pricilla Holm A on: 10/01/2015 10:42 AM   Modules accepted: Orders

## 2015-10-01 NOTE — Telephone Encounter (Signed)
Please advise, patient is requesting refill on Tizanidine for muscle spasms, Dr. Jenny Reichmann is out of the office until Monday

## 2015-10-01 NOTE — Telephone Encounter (Signed)
Pt called requesting a refill for her muscle relaxer that Dr. Jenny Reichmann prescribes for her pain in her leg and upper arm. She did not know the name She is also requesting a knee brace and [employer] states they need authorization from Dr. Christell Faith number [phone]

## 2015-10-01 NOTE — Telephone Encounter (Signed)
Have sent in limited supply since not rxed since last year.

## 2015-10-21 ENCOUNTER — Other Ambulatory Visit: Payer: Self-pay | Admitting: Internal Medicine

## 2015-10-25 ENCOUNTER — Telehealth: Payer: Self-pay | Admitting: Internal Medicine

## 2015-10-25 DIAGNOSIS — L97929 Non-pressure chronic ulcer of unspecified part of left lower leg with unspecified severity: Principal | ICD-10-CM

## 2015-10-25 DIAGNOSIS — L97919 Non-pressure chronic ulcer of unspecified part of right lower leg with unspecified severity: Secondary | ICD-10-CM

## 2015-10-25 NOTE — Telephone Encounter (Signed)
Patient Name: Holly Weiss  DOB: [birth year]    Initial Comment Caller says the incision on her leg busted open while she was asleep. And it is swollen. Says she has sores all over, and blisters under her foot and one on top of it.    Nurse Assessment  Nurse: Raphael Gibney, RN, Vanita Ingles Date/Time (Eastern Time): 10/25/2015 11:19:07 AM  Confirm and document reason for call. If symptomatic, describe symptoms. You must click the next button to save text entered. ---Caller states she does not walk. Has a lot of blisters on the bottom and top of her right foot Incision on her right leg came open during the night. She has 2 plates in her right leg. Incision is swollen. Right knee, right foot and right leg are swollen. She is chair bound. Drained a lot of fluid in the bed. She washed the incision and it has closed back. Both legs are swollen. Had surgery in November. She has taken off her boots.  Has the patient traveled out of the country within the last 30 days? ---Not Applicable  Does the patient have any new or worsening symptoms? ---Yes  Will a triage be completed? ---Yes  Related visit to physician within the last 2 weeks? ---No  Does the PT have any chronic conditions? (i.e. diabetes, asthma, etc.) ---Yes  List chronic conditions. ---CABG; diabetes  Is this a behavioral health or substance abuse call? ---No     Guidelines    Guideline Title Affirmed Question Affirmed Notes  Diabetes - Foot Problems and Questions [1] Ulcer, wound, blister, sore, or red area AND [2] new or increasing    Final Disposition User   See Physician within 24 Hours Stringer, RN, Vera    Comments  Called primary number and left message. Will try secondary number.  Pt states she does not drive and can not come to the office today. Please call pt back regarding blisters.   Referrals  GO TO FACILITY REFUSED   Disagree/Comply: Disagree  Disagree/Comply Reason: Unable to find transportation

## 2015-10-25 NOTE — Telephone Encounter (Signed)
Please advise 

## 2015-10-25 NOTE — Telephone Encounter (Signed)
I have ordered Coffee with skin care consult  Pt to make ROV,   or call for ambulance to ED if unable to be transported otherwise, and has fever, worsening redness, pain, drainage

## 2015-10-30 NOTE — Telephone Encounter (Signed)
Pt informed of MD response below.  

## 2015-10-31 ENCOUNTER — Telehealth: Payer: Self-pay | Admitting: Internal Medicine

## 2015-10-31 NOTE — Telephone Encounter (Signed)
Holly Weiss is calling to get verbals for 2 week 2, then 1 week 2 for edema management to lower extremities  She would also like an order for AVI to see if the patient is compressible . She would like to try compress wraps on her if she is.

## 2015-11-07 ENCOUNTER — Ambulatory Visit: Payer: Self-pay | Admitting: Internal Medicine

## 2015-12-05 ENCOUNTER — Telehealth: Payer: Self-pay

## 2015-12-05 NOTE — Telephone Encounter (Signed)
Please advise does patient need to come in to be seen for this

## 2015-12-05 NOTE — Telephone Encounter (Signed)
Needs OV, any md ok

## 2015-12-05 NOTE — Telephone Encounter (Signed)
Patient called and said her feet and legs are swelling she believe its from surgery. She wants to know if she can take anything for it. I told her she may need to come in and be seen. Please advise.

## 2015-12-05 NOTE — Telephone Encounter (Signed)
Left patient vm to call back to schedule appt  °

## 2015-12-13 ENCOUNTER — Telehealth: Payer: Self-pay

## 2015-12-13 ENCOUNTER — Encounter: Payer: Self-pay | Admitting: Internal Medicine

## 2015-12-13 ENCOUNTER — Ambulatory Visit (INDEPENDENT_AMBULATORY_CARE_PROVIDER_SITE_OTHER): Payer: Commercial Managed Care - HMO | Admitting: Internal Medicine

## 2015-12-13 VITALS — BP 132/82 | HR 86 | Temp 98.4°F | Resp 20 | Wt 195.0 lb

## 2015-12-13 DIAGNOSIS — I10 Essential (primary) hypertension: Secondary | ICD-10-CM

## 2015-12-13 DIAGNOSIS — I509 Heart failure, unspecified: Secondary | ICD-10-CM

## 2015-12-13 DIAGNOSIS — E119 Type 2 diabetes mellitus without complications: Secondary | ICD-10-CM | POA: Diagnosis not present

## 2015-12-13 DIAGNOSIS — L03115 Cellulitis of right lower limb: Secondary | ICD-10-CM | POA: Diagnosis not present

## 2015-12-13 MED ORDER — SULFAMETHOXAZOLE-TRIMETHOPRIM 800-160 MG PO TABS: 1.0000 | ORAL_TABLET | Freq: Two times a day (BID) | ORAL | Status: DC

## 2015-12-13 MED ORDER — FUROSEMIDE 40 MG PO TABS: 40.0000 mg | ORAL_TABLET | Freq: Every day | ORAL | Status: DC

## 2015-12-13 MED ORDER — ALBUTEROL SULFATE HFA 108 (90 BASE) MCG/ACT IN AERS: 2.0000 | INHALATION_SPRAY | Freq: Two times a day (BID) | RESPIRATORY_TRACT | Status: DC | PRN

## 2015-12-13 NOTE — Telephone Encounter (Signed)
Medication refill sent to pharmacy  

## 2015-12-13 NOTE — Progress Notes (Signed)
Pre visit review using our clinic review tool, if applicable. No additional management support is needed unless otherwise documented below in the visit note. 

## 2015-12-13 NOTE — Patient Instructions (Signed)
Please take all new medication as prescribed - the antibiotic  OK to increase the lasix to 40 mg per day  Please continue all other medications as before, and refills have been done if requested.  Please have the pharmacy call with any other refills you may need.  Please continue your efforts at being more active, low cholesterol diet, and weight control.  Please keep your appointments with your specialists as you may have planned  Please return in 2 weeks, or sooner if needed

## 2015-12-13 NOTE — Progress Notes (Signed)
Subjective:    Patient ID: Holly Weiss, female    DOB: 1941-04-17, 75 y.o.   MRN: [id number]  HPI  Here to f/u; overall doing ok,  Pt denies chest pain, increasing sob or doe, wheezing, orthopnea, PND, palpitations, dizziness or syncope, but has had right > left LE edema in the past 2 wks.  .  Pt denies new neurological symptoms such as new headache, or facial or extremity weakness or numbness.  Pt denies polydipsia, polyuria, or low sugar episode.   Pt denies new neurological symptoms such as new headache, or facial or extremity weakness or numbness.   Pt states overall good compliance with meds.  Pt denies fever, wt loss, night sweats, loss of appetite, or other constitutional symptoms, but has in particular heat/red/tender/swelling to distal ant RLE x several days. Past Medical History  Diagnosis Date  . ANEMIA-NOS 01/18/2008  . 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 ASTHMA, WITH ACUTE EXACERBATION 11/29/2008  . Chronic pain syndrome 01/21/2010  . DIABETIC  RETINOPATHY 05/30/2007  . DM, UNCOMPLICATED, TYPE II, UNCONTROLLED 05/30/2007  . 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 HYPERLIPIDEMIA 05/30/2007  . HYPERTENSION 10/31/2007  . LOW BACK PAIN 01/18/2008  . MORBID OBESITY, HX OF 05/30/2007  . NEPHROLITHIASIS, HX OF 01/18/2008  . SHOULDER PAIN, BILATERAL 04/09/2009  . Migraine headache   . Spinal stenosis   . Bilateral carpal tunnel syndrome   . Shortness of breath     with walking  . Urgency of urination   . Arthritis   . Edema of both legs     takes Lasix  . Numbness and tingling in hands   . GERD (gastroesophageal reflux disease)   . Femur fracture, right ([place])   . Seasonal allergies   . History of kidney stones   . Depression   . History of blood transfusion     spine surgery   Past Surgical History  Procedure Laterality Date  . Cataract extraction    . Cholecystectomy    . Thoracic fusion    . Lumbar disc surgury    . Tubal ligation    . Back surgery    . Total knee arthroplasty with  revision components Right 07/12/2013    Procedure: TOTAL KNEE ARTHROPLASTY WITH TIBIA REVISION COMPONENTS;  Surgeon: Ninetta Lights, MD;  Location: [place];  Service: Orthopedics;  Laterality: Right;  . Orif tibia fracture Right 01/16/2014    Procedure: RIGHT TIBIA REPAIR NON-UNION/MALUNION TIBIA WITH SLIDING GRAFT;  Surgeon: Renette Butters, MD;  Location: [place];  Service: Orthopedics;  Laterality: Right;    reports that she has never smoked. She does not have any smokeless tobacco history on file. She reports that she does not drink alcohol or use illicit drugs. family history includes Dementia in her mother; Depression in her sister; Diabetes in her mother and sister; Heart disease in her father and mother; Lung cancer in her father. Allergies  Allergen Reactions  . Adhesive [Tape] Itching  . Endocet [Oxycodone-Acetaminophen] Itching  . Latex Itching   Current Outpatient Prescriptions on File Prior to Visit  Medication Sig Dispense Refill  . aspirin 81 MG tablet Take 1 tablet (81 mg total) by mouth daily. 30 tablet 0  . Blood Glucose Monitoring Suppl (ONE TOUCH BASIC SYSTEM) W/DEVICE KIT Use as directed once daily to check blood sugar.  Diagnosis code 250.02 1 each 0  . Calcium Carbonate-Vitamin D (CALTRATE 600+D PO) Take 2 tablets by mouth daily.    Marland Kitchen glucose blood (ONE  TOUCH TEST STRIPS) test strip Use as directed once daily to check blood sugar.  Diagnosis code E11.9 100 each 11  . insulin aspart (NOVOLOG FLEXPEN) 100 UNIT/ML FlexPen Inject 15 Units into the skin daily. 15 mL 3  . Insulin Pen Needle (B-D ULTRAFINE III SHORT PEN) 31G X 8 MM MISC Use to check blood sugars twice a day Dx E11.9 100 each 3  . LANTUS SOLOSTAR 100 UNIT/ML Solostar Pen INJECT 45 UNITS INTO THE SKIN EVERY DAY 15 mL 3  . Magnesium 250 MG TABS Take 250 mg by mouth daily.    . Multiple Vitamins-Minerals (ONE-A-DAY EXTRAS ANTIOXIDANT PO) Take 1 tablet by mouth daily.     . ONE TOUCH LANCETS MISC Use as directed once  daily to check blood sugar. DX E11.09 100 each 3  . rosuvastatin (CRESTOR) 20 MG tablet Take 0.5 tablets (10 mg total) by mouth daily. 30 tablet 11  . tiZANidine (ZANAFLEX) 4 MG tablet TAKE 1 TABLET BY MOUTH EVERY 8 HOURS AS NEEDED FOR MUSCLE SPASMS 300 tablet 0  . metoprolol succinate (TOPROL XL) 100 MG 24 hr tablet Take 50 mg by mouth.     No current facility-administered medications on file prior to visit.   Review of Systems  Constitutional: Negative for unusual diaphoresis or night sweats HENT: Negative for ear swelling or discharge Eyes: Negative for worsening visual haziness  Respiratory: Negative for choking and stridor.   Gastrointestinal: Negative for distension or worsening eructation Genitourinary: Negative for retention or change in urine volume.  Musculoskeletal: Negative for other MSK pain or swelling Skin: Negative for color change and worsening wound Neurological: Negative for tremors and numbness other than noted  Psychiatric/Behavioral: Negative for decreased concentration or agitation other than above       Objective:   Physical Exam BP 132/82 mmHg  Pulse 86  Temp(Src) 98.4 F (36.9 C) (Oral)  Resp 20  Wt 195 lb (88.451 kg)  SpO2 96% VS noted,  Constitutional: Pt appears in no apparent distress HENT: Head: NCAT.  Right Ear: External ear normal.  Left Ear: External ear normal.  Eyes: . Pupils are equal, round, and reactive to light. Conjunctivae and EOM are normal Neck: Normal range of motion. Neck supple.  Cardiovascular: Normal rate and regular rhythm.   Pulmonary/Chest: Effort normal and breath sounds without rales or wheezing.  Abd:  Soft, NT, ND, + BS Neurological: Pt is alert. Not confused , motor grossly intact Skin: Skin is warm. No rash, 1-2+ bilat LE edema right >>left, with distal erythema/tender above ankle approx 4 cm area, no abscess or drainage or red streaks Psychiatric: Pt behavior is normal. No agitation.     Assessment & Plan:

## 2015-12-14 NOTE — Assessment & Plan Note (Signed)
Mild to mod flare with worsening LE edema, states good compliance with meds, for increased lasix to 40qd, ,  to f/u any worsening symptoms or concerns, f/u 2 wks

## 2015-12-14 NOTE — Assessment & Plan Note (Signed)
stable overall by history and exam, recent data reviewed with pt, and pt to continue medical treatment as before,  to f/u any worsening symptoms or concerns Lab Results  Component Value Date   HGBA1C 7.1* 08/09/2015

## 2015-12-14 NOTE — Assessment & Plan Note (Signed)
stable overall by history and exam, recent data reviewed with pt, and pt to continue medical treatment as before,  to f/u any worsening symptoms or concerns BP Readings from Last 3 Encounters:  12/13/15 132/82  08/09/15 124/64  01/30/15 142/86

## 2015-12-14 NOTE — Assessment & Plan Note (Signed)
Mild to mod, for antibx course - septra DS,  to f/u any worsening symptoms or concerns

## 2015-12-31 ENCOUNTER — Encounter: Payer: Self-pay | Admitting: Internal Medicine

## 2015-12-31 ENCOUNTER — Ambulatory Visit (INDEPENDENT_AMBULATORY_CARE_PROVIDER_SITE_OTHER): Payer: Commercial Managed Care - HMO | Admitting: Internal Medicine

## 2015-12-31 ENCOUNTER — Other Ambulatory Visit: Payer: Self-pay | Admitting: Internal Medicine

## 2015-12-31 ENCOUNTER — Other Ambulatory Visit (INDEPENDENT_AMBULATORY_CARE_PROVIDER_SITE_OTHER): Payer: Commercial Managed Care - HMO

## 2015-12-31 ENCOUNTER — Telehealth: Payer: Self-pay

## 2015-12-31 VITALS — BP 140/80 | HR 74 | Temp 97.8°F | Resp 20 | Wt 195.0 lb

## 2015-12-31 DIAGNOSIS — E119 Type 2 diabetes mellitus without complications: Secondary | ICD-10-CM

## 2015-12-31 DIAGNOSIS — I5031 Acute diastolic (congestive) heart failure: Secondary | ICD-10-CM | POA: Diagnosis not present

## 2015-12-31 DIAGNOSIS — F0392 Unspecified dementia, unspecified severity, with psychotic disturbance: Secondary | ICD-10-CM

## 2015-12-31 DIAGNOSIS — F0391 Unspecified dementia with behavioral disturbance: Secondary | ICD-10-CM | POA: Diagnosis not present

## 2015-12-31 DIAGNOSIS — I10 Essential (primary) hypertension: Secondary | ICD-10-CM | POA: Diagnosis not present

## 2015-12-31 DIAGNOSIS — L03115 Cellulitis of right lower limb: Secondary | ICD-10-CM

## 2015-12-31 LAB — HEPATIC FUNCTION PANEL
ALBUMIN: 3.8 g/dL (ref 3.5–5.2)
ALT: 13 U/L (ref 0–35)
AST: 15 U/L (ref 0–37)
Alkaline Phosphatase: 79 U/L (ref 39–117)
Bilirubin, Direct: 0.1 mg/dL (ref 0.0–0.3)
TOTAL PROTEIN: 7.2 g/dL (ref 6.0–8.3)
Total Bilirubin: 0.3 mg/dL (ref 0.2–1.2)

## 2015-12-31 LAB — LIPID PANEL
Cholesterol: 142 mg/dL (ref 0–200)
HDL: 44.1 mg/dL (ref >39.00)
LDL Cholesterol: 85 mg/dL (ref 0–99)
NonHDL: 97.72
TRIGLYCERIDES: 66 mg/dL (ref 0.0–149.0)
Total CHOL/HDL Ratio: 3
VLDL: 13.2 mg/dL (ref 0.0–40.0)

## 2015-12-31 LAB — HEMOGLOBIN A1C: HEMOGLOBIN A1C: 8 % — AB (ref 4.6–6.5)

## 2015-12-31 LAB — BASIC METABOLIC PANEL
BUN: 59 mg/dL — AB (ref 6–23)
CHLORIDE: 110 meq/L (ref 96–112)
CO2: 24 meq/L (ref 19–32)
CREATININE: 1.51 mg/dL — AB (ref 0.40–1.20)
Calcium: 9.9 mg/dL (ref 8.4–10.5)
GFR: 43.2 mL/min — ABNORMAL LOW (ref >60.00)
GLUCOSE: 113 mg/dL — AB (ref 70–99)
Potassium: 4.7 mEq/L (ref 3.5–5.1)
Sodium: 139 mEq/L (ref 135–145)

## 2015-12-31 MED ORDER — INSULIN GLARGINE 100 UNIT/ML SOLOSTAR PEN: PEN_INJECTOR | SUBCUTANEOUS | Status: DC

## 2015-12-31 MED ORDER — POTASSIUM CHLORIDE ER 10 MEQ PO TBCR: 10.0000 meq | EXTENDED_RELEASE_TABLET | Freq: Every day | ORAL | Status: DC

## 2015-12-31 MED ORDER — METOPROLOL SUCCINATE ER 50 MG PO TB24: 50.0000 mg | ORAL_TABLET | Freq: Every day | ORAL | Status: DC

## 2015-12-31 MED ORDER — ROSUVASTATIN CALCIUM 20 MG PO TABS: 10.0000 mg | ORAL_TABLET | Freq: Every day | ORAL | Status: DC

## 2015-12-31 MED ORDER — INSULIN ASPART 100 UNIT/ML FLEXPEN: 15.0000 [IU] | PEN_INJECTOR | Freq: Every day | SUBCUTANEOUS | Status: DC

## 2015-12-31 MED ORDER — FUROSEMIDE 40 MG PO TABS: 40.0000 mg | ORAL_TABLET | Freq: Two times a day (BID) | ORAL | Status: DC

## 2015-12-31 NOTE — Assessment & Plan Note (Signed)
With persistent edema no change despite the increased lasix to 40 mg; for incr 40 bid, kdur 10 qd prn lasix use, check BMP today, for echo repeat and f/u 2 wks

## 2015-12-31 NOTE — Assessment & Plan Note (Signed)
Resolved,  to f/u any worsening symptoms or concerns  

## 2015-12-31 NOTE — Telephone Encounter (Signed)
Medication refills sent to pharmacy 

## 2015-12-31 NOTE — Progress Notes (Signed)
Subjective:    Patient ID: Holly Weiss, female    DOB: 10-04-1940, 75 y.o.   MRN: [id number]  HPI  Here to f/u, fortunately all erythema and most of discomfort/tender/red/swelling to legs has resolved with antibiotics, but overall swelling just not improved with increased lasix from 20 to 40 mg, and wt essentially no change. Pt denies chest pain, increased sob or doe, wheezing, orthopnea, PND, increased LE swelling, palpitations, dizziness or syncope, its just that swelling has not improved. Wt Readings from Last 3 Encounters:  12/31/15 195 lb (88.451 kg)  12/13/15 195 lb (88.451 kg)  08/01/14 210 lb (95.255 kg)   BP Readings from Last 3 Encounters:  12/31/15 140/80  12/13/15 132/82  08/09/15 124/64  Pt denies new neurological symptoms such as new headache, or facial or extremity weakness or numbness   Pt denies polydipsia, polyuria, or low sugar symptoms such as weakness or confusion improved with po intake.  Pt states overall good compliance with meds, trying to follow lower cholesterol, diabetic diet, wt overall stable but little exercise however.    Pt denies fever, wt loss, night sweats, loss of appetite, or other constitutional symptoms  Dementia overall stable symptomatically, and not assoc with behavioral changes such as hallucinations, paranoia, or agitation. Past Medical History  Diagnosis Date  . ANEMIA-NOS 01/18/2008  . 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 ASTHMA, WITH ACUTE EXACERBATION 11/29/2008  . Chronic pain syndrome 01/21/2010  . DIABETIC  RETINOPATHY 05/30/2007  . DM, UNCOMPLICATED, TYPE II, UNCONTROLLED 05/30/2007  . 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 HYPERLIPIDEMIA 05/30/2007  . HYPERTENSION 10/31/2007  . LOW BACK PAIN 01/18/2008  . MORBID OBESITY, HX OF 05/30/2007  . NEPHROLITHIASIS, HX OF 01/18/2008  . SHOULDER PAIN, BILATERAL 04/09/2009  . Migraine headache   . Spinal stenosis   . Bilateral carpal tunnel syndrome   . Shortness of breath     with walking  . Urgency of  urination   . Arthritis   . Edema of both legs     takes Lasix  . Numbness and tingling in hands   . GERD (gastroesophageal reflux disease)   . Femur fracture, right ([place])   . Seasonal allergies   . History of kidney stones   . Depression   . History of blood transfusion     spine surgery   Past Surgical History  Procedure Laterality Date  . Cataract extraction    . Cholecystectomy    . Thoracic fusion    . Lumbar disc surgury    . Tubal ligation    . Back surgery    . Total knee arthroplasty with revision components Right 07/12/2013    Procedure: TOTAL KNEE ARTHROPLASTY WITH TIBIA REVISION COMPONENTS;  Surgeon: Ninetta Lights, MD;  Location: [facility];  Service: Orthopedics;  Laterality: Right;  . Orif tibia fracture Right 01/16/2014    Procedure: RIGHT TIBIA REPAIR NON-UNION/MALUNION TIBIA WITH SLIDING GRAFT;  Surgeon: Renette Butters, MD;  Location: [facility];  Service: Orthopedics;  Laterality: Right;    reports that she has never smoked. She does not have any smokeless tobacco history on file. She reports that she does not drink alcohol or use illicit drugs. family history includes Dementia in her mother; Depression in her sister; Diabetes in her mother and sister; Heart disease in her father and mother; Lung cancer in her father. Allergies  Allergen Reactions  . Adhesive [Tape] Itching  . Endocet [Oxycodone-Acetaminophen] Itching  . Latex Itching   Current Outpatient Prescriptions on  File Prior to Visit  Medication Sig Dispense Refill  . albuterol (VENTOLIN HFA) 108 (90 Base) MCG/ACT inhaler Inhale 2 puffs into the lungs 2 (two) times daily as needed for wheezing or shortness of breath. 1 Inhaler 11  . aspirin 81 MG tablet Take 1 tablet (81 mg total) by mouth daily. 30 tablet 0  . Blood Glucose Monitoring Suppl (ONE TOUCH BASIC SYSTEM) W/DEVICE KIT Use as directed once daily to check blood sugar.  Diagnosis code 250.02 1 each 0  . Calcium Carbonate-Vitamin D (CALTRATE 600+D PO)  Take 2 tablets by mouth daily.    Marland Kitchen glucose blood (ONE TOUCH TEST STRIPS) test strip Use as directed once daily to check blood sugar.  Diagnosis code E11.9 100 each 11  . Insulin Pen Needle (B-D ULTRAFINE III SHORT PEN) 31G X 8 MM MISC Use to check blood sugars twice a day Dx E11.9 100 each 3  . Magnesium 250 MG TABS Take 250 mg by mouth daily.    . Multiple Vitamins-Minerals (ONE-A-DAY EXTRAS ANTIOXIDANT PO) Take 1 tablet by mouth daily.     . ONE TOUCH LANCETS MISC Use as directed once daily to check blood sugar. DX E11.09 100 each 3  . sulfamethoxazole-trimethoprim (BACTRIM DS) 800-160 MG tablet Take 1 tablet by mouth 2 (two) times daily. 20 tablet 0  . tiZANidine (ZANAFLEX) 4 MG tablet TAKE 1 TABLET BY MOUTH EVERY 8 HOURS AS NEEDED FOR MUSCLE SPASMS 300 tablet 0   No current facility-administered medications on file prior to visit.   Review of Systems  Constitutional: Negative for unusual diaphoresis or night sweats HENT: Negative for ear swelling or discharge Eyes: Negative for worsening visual haziness  Respiratory: Negative for choking and stridor.   Gastrointestinal: Negative for distension or worsening eructation Genitourinary: Negative for retention or change in urine volume.  Musculoskeletal: Negative for other MSK pain or swelling Skin: Negative for color change and worsening wound Neurological: Negative for tremors and numbness other than noted  Psychiatric/Behavioral: Negative for decreased concentration or agitation other than above       Objective:   Physical Exam BP 140/80 mmHg  Pulse 74  Temp(Src) 97.8 F (36.6 C) (Oral)  Resp 20  Wt 195 lb (88.451 kg)  SpO2 99% VS noted,  Constitutional: Pt appears in no apparent distress HENT: Head: NCAT.  Right Ear: External ear normal.  Left Ear: External ear normal.  Eyes: . Pupils are equal, round, and reactive to light. Conjunctivae and EOM are normal Neck: Normal range of motion. Neck supple.  Cardiovascular: Normal  rate and regular rhythm.   Pulmonary/Chest: Effort normal and breath sounds without rales or wheezing.  Abd:  Soft, NT, ND, + BS Neurological: Pt is alert. Not confused , motor grossly intact Skin: Skin is warm. No rash or erythema, but has bilat 1-2+ bilat LE edema only mildly improved, no frank ulcerations Psychiatric: Pt behavior is normal. No agitation.   Most recent summary EF per stress echo per Dr Mare Ferrari dec 2014: The estimated LV ejection fraction was 55%. - Normal wall motion; no LV regional wall motion abnormalities.    Assessment & Plan:

## 2015-12-31 NOTE — Assessment & Plan Note (Signed)
stable overall by history and exam, no significant behavioral issues., and pt to continue medical treatment as before,  to f/u any worsening symptoms or concerns

## 2015-12-31 NOTE — Progress Notes (Signed)
Pre visit review using our clinic review tool, if applicable. No additional management support is needed unless otherwise documented below in the visit note. 

## 2015-12-31 NOTE — Patient Instructions (Signed)
OK to increase the lasix to 40 mg twice per day, and the potassium  Please continue all other medications as before, and refills have been done if requested.  Please have the pharmacy call with any other refills you may need.  Please continue your efforts at being more active, low cholesterol diet, and weight control.  You are otherwise up to date with prevention measures today.  Please keep your appointments with your specialists as you may have planned  You will be contacted regarding the referral for: echocardiogram  Please go to the LAB in the Basement (turn left off the elevator) for the tests to be done today  You will be contacted by phone if any changes need to be made immediately.  Otherwise, you will receive a letter about your results with an explanation, but please check with MyChart first.  Please remember to sign up for MyChart if you have not done so, as this will be important to you in the future with finding out test results, communicating by private email, and scheduling acute appointments online when needed.  Please return in 2 weeks, or sooner if needed

## 2015-12-31 NOTE — Assessment & Plan Note (Signed)
stable overall by history and exam, recent data reviewed with pt, and pt to continue medical treatment as before,  to f/u any worsening symptoms or concerns BP Readings from Last 3 Encounters:  12/31/15 140/80  12/13/15 132/82  08/09/15 124/64

## 2016-01-14 ENCOUNTER — Ambulatory Visit: Payer: Commercial Managed Care - HMO | Admitting: Internal Medicine

## 2016-01-21 ENCOUNTER — Other Ambulatory Visit (HOSPITAL_COMMUNITY): Payer: Self-pay

## 2016-01-28 ENCOUNTER — Ambulatory Visit (INDEPENDENT_AMBULATORY_CARE_PROVIDER_SITE_OTHER): Payer: Commercial Managed Care - HMO | Admitting: Internal Medicine

## 2016-01-28 ENCOUNTER — Encounter: Payer: Self-pay | Admitting: Internal Medicine

## 2016-01-28 VITALS — BP 132/76 | HR 70 | Temp 97.5°F | Resp 20 | Wt 196.0 lb

## 2016-01-28 DIAGNOSIS — S81809A Unspecified open wound, unspecified lower leg, initial encounter: Secondary | ICD-10-CM | POA: Diagnosis not present

## 2016-01-28 DIAGNOSIS — M79671 Pain in right foot: Secondary | ICD-10-CM | POA: Insufficient documentation

## 2016-01-28 DIAGNOSIS — I5032 Chronic diastolic (congestive) heart failure: Secondary | ICD-10-CM

## 2016-01-28 DIAGNOSIS — E119 Type 2 diabetes mellitus without complications: Secondary | ICD-10-CM | POA: Diagnosis not present

## 2016-01-28 MED ORDER — DOXYCYCLINE HYCLATE 100 MG PO TABS: 100.0000 mg | ORAL_TABLET | Freq: Two times a day (BID) | ORAL | Status: DC

## 2016-01-28 MED ORDER — TRAMADOL HCL 50 MG PO TABS: 50.0000 mg | ORAL_TABLET | Freq: Four times a day (QID) | ORAL | Status: DC | PRN

## 2016-01-28 NOTE — Assessment & Plan Note (Signed)
For tramadol prn, PT to ambulate, may need podiatry if not improved,  to f/u any worsening symptoms or concerns

## 2016-01-28 NOTE — Assessment & Plan Note (Signed)
stable overall by history and exam, recent data reviewed with pt, and pt to continue medical treatment as before,  to f/u any worsening symptoms or concerns Lab Results  Component Value Date   HGBA1C 8.0* 12/31/2015

## 2016-01-28 NOTE — Progress Notes (Addendum)
Subjective:    Patient ID: Holly Weiss, female    DOB: September 03, 1940, 75 y.o.   MRN: [id number]  HPI   Here to f/u with onset x 1 wk of worsening bilat LE swelling, now with redness, tender, weepy clearish fluid and now more cloudy in recent days.  Has 2 leg ulcers to each distal leg.  Has felt some feverish, but not sure about actual temperaure, No chills. Right heel also cracked and very very sore so that she cannot really walk, but little redness or drainage at that site.  Here with family, in wheelchair to assist today.  Was tx last month for LE cellultis but now finished bactrim.  Pt denies chest pain, increased sob or doe, wheezing, orthopnea, PND,  palpitations, dizziness or syncope.  Nothing seems to make the pain, redness or swelling any better or worse,  Echo was ordered may 30 for r/o diastolic dysfxn, but not yet resulted. Overall good compliance with treatment, and good medicine tolerability.  Pt denies polydipsia, polyuria,   Past Medical History  Diagnosis Date  . ANEMIA-NOS 01/18/2008  . 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 ASTHMA, WITH ACUTE EXACERBATION 11/29/2008  . Chronic pain syndrome 01/21/2010  . DIABETIC  RETINOPATHY 05/30/2007  . DM, UNCOMPLICATED, TYPE II, UNCONTROLLED 05/30/2007  . 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 HYPERLIPIDEMIA 05/30/2007  . HYPERTENSION 10/31/2007  . LOW BACK PAIN 01/18/2008  . MORBID OBESITY, HX OF 05/30/2007  . NEPHROLITHIASIS, HX OF 01/18/2008  . SHOULDER PAIN, BILATERAL 04/09/2009  . Migraine headache   . Spinal stenosis   . Bilateral carpal tunnel syndrome   . Shortness of breath     with walking  . Urgency of urination   . Arthritis   . Edema of both legs     takes Lasix  . Numbness and tingling in hands   . GERD (gastroesophageal reflux disease)   . Femur fracture, right ([place])   . Seasonal allergies   . History of kidney stones   . Depression   . History of blood transfusion     spine surgery   Past Surgical History  Procedure Laterality  Date  . Cataract extraction    . Cholecystectomy    . Thoracic fusion    . Lumbar disc surgury    . Tubal ligation    . Back surgery    . Total knee arthroplasty with revision components Right 07/12/2013    Procedure: TOTAL KNEE ARTHROPLASTY WITH TIBIA REVISION COMPONENTS;  Surgeon: Ninetta Lights, MD;  Location: [facility];  Service: Orthopedics;  Laterality: Right;  . Orif tibia fracture Right 01/16/2014    Procedure: RIGHT TIBIA REPAIR NON-UNION/MALUNION TIBIA WITH SLIDING GRAFT;  Surgeon: Renette Butters, MD;  Location: [facility];  Service: Orthopedics;  Laterality: Right;    reports that she has never smoked. She does not have any smokeless tobacco history on file. She reports that she does not drink alcohol or use illicit drugs. family history includes Dementia in her mother; Depression in her sister; Diabetes in her mother and sister; Heart disease in her father and mother; Lung cancer in her father. Allergies  Allergen Reactions  . Adhesive [Tape] Itching  . Endocet [Oxycodone-Acetaminophen] Itching  . Latex Itching   Current Outpatient Prescriptions on File Prior to Visit  Medication Sig Dispense Refill  . albuterol (VENTOLIN HFA) 108 (90 Base) MCG/ACT inhaler Inhale 2 puffs into the lungs 2 (two) times daily as needed for wheezing or shortness of breath. 1 Inhaler  11  . aspirin 81 MG tablet Take 1 tablet (81 mg total) by mouth daily. 30 tablet 0  . Blood Glucose Monitoring Suppl (ONE TOUCH BASIC SYSTEM) W/DEVICE KIT Use as directed once daily to check blood sugar.  Diagnosis code 250.02 1 each 0  . Calcium Carbonate-Vitamin D (CALTRATE 600+D PO) Take 2 tablets by mouth daily.    . furosemide (LASIX) 40 MG tablet Take 1 tablet (40 mg total) by mouth 2 (two) times daily. As needed for persistent swelling 60 tablet 11  . glucose blood (ONE TOUCH TEST STRIPS) test strip Use as directed once daily to check blood sugar.  Diagnosis code E11.9 100 each 11  . insulin aspart (NOVOLOG FLEXPEN) 100  UNIT/ML FlexPen Inject 15 Units into the skin daily. 15 mL 3  . Insulin Glargine (LANTUS SOLOSTAR) 100 UNIT/ML Solostar Pen INJECT 45 UNITS INTO THE SKIN EVERY DAY 15 mL 3  . Insulin Pen Needle (B-D ULTRAFINE III SHORT PEN) 31G X 8 MM MISC Use to check blood sugars twice a day Dx E11.9 100 each 3  . Magnesium 250 MG TABS Take 250 mg by mouth daily.    . metoprolol succinate (TOPROL-XL) 50 MG 24 hr tablet Take 1 tablet (50 mg total) by mouth daily. 30 tablet 3  . Multiple Vitamins-Minerals (ONE-A-DAY EXTRAS ANTIOXIDANT PO) Take 1 tablet by mouth daily.     . ONE TOUCH LANCETS MISC Use as directed once daily to check blood sugar. DX E11.09 100 each 3  . potassium chloride (K-DUR) 10 MEQ tablet TAKE 1 TABLET BY MOUTH DAILY,(*ONLY ON DAY THE LASIX IS TAKEN*) 180 tablet 3  . rosuvastatin (CRESTOR) 20 MG tablet Take 0.5 tablets (10 mg total) by mouth daily. 30 tablet 11  . sulfamethoxazole-trimethoprim (BACTRIM DS) 800-160 MG tablet Take 1 tablet by mouth 2 (two) times daily. 20 tablet 0  . tiZANidine (ZANAFLEX) 4 MG tablet TAKE 1 TABLET BY MOUTH EVERY 8 HOURS AS NEEDED FOR MUSCLE SPASMS 300 tablet 0   No current facility-administered medications on file prior to visit.    Review of Systems  Constitutional: Negative for unusual diaphoresis or night sweats HENT: Negative for ear swelling or discharge Eyes: Negative for worsening visual haziness  Respiratory: Negative for choking and stridor.   Gastrointestinal: Negative for distension or worsening eructation Genitourinary: Negative for retention or change in urine volume.  Musculoskeletal: Negative for other MSK pain or swelling Skin: Negative for color change and worsening wound Neurological: Negative for tremors and numbness other than noted  Psychiatric/Behavioral: Negative for decreased concentration or agitation other than above       Objective:   Physical Exam BP 132/76 mmHg  Pulse 70  Temp(Src) 97.5 F (36.4 C) (Oral)  Resp 20  Wt  196 lb (88.905 kg)  SpO2 99% VS noted, nontoxic, examiined in wheelchair Constitutional: Pt appears in no apparent distress HENT: Head: NCAT.  Right Ear: External ear normal.  Left Ear: External ear normal.  Eyes: . Pupils are equal, round, and reactive to light. Conjunctivae and EOM are normal Neck: Normal range of motion. Neck supple.  Cardiovascular: Normal rate and regular rhythm.   Pulmonary/Chest: Effort normal and breath sounds without rales or wheezing.  Neurological: Pt is alert. Not confused , motor grossly intact Skin: Skin is warm. No rash, but 1-2+ bilat LE edema to knees; RLE ulcer #1 - 6 cm x .5 cm widest, pretibial in a "linear" fashion at mid leg, with soreness but little erythema, has some  weepiness RLE ulcer #2 located at distal post third leg with marked tender and cloud d/c, 2-3 cm, unable to determine depth LLE ulcer #1 - to distal leg anteriorly approx 1 cm above the ankle, approx 2.5 cm, oval fairly superficial without fluctuance, but with exudate and some off color drainage, mild tender surrounding LLE ulcer #2 located post distal leg approx 2 cm above heel, superficial, mild tender, weepy, approx 1-2 cm, unable to determine depth Right plantar heel with callous and mult skin cracks without red/swelling or drainage Psychiatric: Pt behavior is normal. No agitation.  Dorsalis pedis 1+ bilat LE     Assessment & Plan:

## 2016-01-28 NOTE — Patient Instructions (Signed)
Your leg wounds were applied some neosporin today and dressed.  Please take all new medication as prescribed - the antibiotic and pain medication  Please continue all other medications as before, and refills have been done if requested.  Please have the pharmacy call with any other refills you may need.  Please keep your appointments with your specialists as you may have planned  You will be contacted regarding the referral for: Home Health for Nursing and Physical Therapy, as well as [facility] will be contacted regarding the referral for: Wound clinic as well  Please return in 1 month, or sooner if needed

## 2016-01-28 NOTE — Progress Notes (Signed)
Pre visit review using our clinic review tool, if applicable. No additional management support is needed unless otherwise documented below in the visit note. 

## 2016-01-28 NOTE — Assessment & Plan Note (Addendum)
With total 4 ulcers by my count today, for neosporin and gauze, also for [facility] wound consults, Nursing and PT, also wound clinic  - likely to be seen today, also for doxycycline for possible cellulitis post RLE ulcer  Note:  Total time for pt hx, exam, review of record with pt in the room, determination of diagnoses and plan for further eval and tx is > 40 min, with over 50% spent in coordination and counseling of patient

## 2016-01-28 NOTE — Assessment & Plan Note (Signed)
stable overall by history and exam, recent data reviewed with pt, and pt to continue medical treatment as before,  to f/u any worsening symptoms or concerns, echo f/u has been ordered

## 2016-01-29 ENCOUNTER — Telehealth: Payer: Self-pay | Admitting: Internal Medicine

## 2016-01-29 NOTE — Telephone Encounter (Signed)
Ok for verbal for all 

## 2016-01-29 NOTE — Telephone Encounter (Signed)
Please advise 

## 2016-01-29 NOTE — Telephone Encounter (Signed)
Requesting wound care orders: Requesting zinc oxide unna boot to both legs twice a week Can not see patient again until they get a verbal order to see patient again.

## 2016-01-30 NOTE — Telephone Encounter (Signed)
Verbals given  

## 2016-01-31 ENCOUNTER — Other Ambulatory Visit: Payer: Self-pay | Admitting: Internal Medicine

## 2016-02-05 ENCOUNTER — Telehealth: Payer: Self-pay

## 2016-02-05 NOTE — Telephone Encounter (Signed)
Ok for verbal orders ?

## 2016-02-05 NOTE — Telephone Encounter (Signed)
Verbal Orders 8weeks for 2xs a week  [id number] Pramod .Marland Kitchen With [employer]

## 2016-02-05 NOTE — Telephone Encounter (Signed)
Please advise 

## 2016-02-06 ENCOUNTER — Ambulatory Visit: Payer: Self-pay | Admitting: Internal Medicine

## 2016-02-06 NOTE — Telephone Encounter (Signed)
Notified PT w/MD response../[email or web address] 

## 2016-02-10 ENCOUNTER — Other Ambulatory Visit: Payer: Self-pay | Admitting: Internal Medicine

## 2016-02-10 ENCOUNTER — Telehealth: Payer: Self-pay

## 2016-02-10 NOTE — Telephone Encounter (Signed)
PA initiated via CoverMyMeds key [id number]

## 2016-02-12 ENCOUNTER — Telehealth: Payer: Self-pay

## 2016-02-12 MED ORDER — QUETIAPINE FUMARATE 25 MG PO TABS: 25.0000 mg | ORAL_TABLET | Freq: Two times a day (BID) | ORAL | Status: DC

## 2016-02-12 NOTE — Telephone Encounter (Signed)
PA DENIED, please advise on alternative medication. Thanks!

## 2016-02-12 NOTE — Telephone Encounter (Signed)
PA initiated via CoverMyMeds key [id number]

## 2016-02-12 NOTE — Telephone Encounter (Signed)
zyprexa denies per insurance  OK for seroquel 25 mg bid - done erx

## 2016-02-12 NOTE — Telephone Encounter (Signed)
Left message for patient stating that a new prescription was sent to the pharmacy

## 2016-02-13 ENCOUNTER — Encounter (HOSPITAL_BASED_OUTPATIENT_CLINIC_OR_DEPARTMENT_OTHER): Payer: Commercial Managed Care - HMO | Attending: Internal Medicine

## 2016-02-13 DIAGNOSIS — L97821 Non-pressure chronic ulcer of other part of left lower leg limited to breakdown of skin: Secondary | ICD-10-CM | POA: Diagnosis not present

## 2016-02-13 DIAGNOSIS — Z794 Long term (current) use of insulin: Secondary | ICD-10-CM | POA: Insufficient documentation

## 2016-02-13 DIAGNOSIS — E11319 Type 2 diabetes mellitus with unspecified diabetic retinopathy without macular edema: Secondary | ICD-10-CM | POA: Insufficient documentation

## 2016-02-13 DIAGNOSIS — E11622 Type 2 diabetes mellitus with other skin ulcer: Secondary | ICD-10-CM | POA: Insufficient documentation

## 2016-02-13 DIAGNOSIS — L97811 Non-pressure chronic ulcer of other part of right lower leg limited to breakdown of skin: Secondary | ICD-10-CM | POA: Insufficient documentation

## 2016-02-13 DIAGNOSIS — L97221 Non-pressure chronic ulcer of left calf limited to breakdown of skin: Secondary | ICD-10-CM | POA: Diagnosis not present

## 2016-02-13 DIAGNOSIS — E11621 Type 2 diabetes mellitus with foot ulcer: Secondary | ICD-10-CM | POA: Diagnosis not present

## 2016-02-13 DIAGNOSIS — M1990 Unspecified osteoarthritis, unspecified site: Secondary | ICD-10-CM | POA: Insufficient documentation

## 2016-02-13 DIAGNOSIS — I10 Essential (primary) hypertension: Secondary | ICD-10-CM | POA: Diagnosis not present

## 2016-02-13 DIAGNOSIS — L97411 Non-pressure chronic ulcer of right heel and midfoot limited to breakdown of skin: Secondary | ICD-10-CM | POA: Insufficient documentation

## 2016-02-14 NOTE — Telephone Encounter (Signed)
APPROVED 02/13/2016 - 02/12/2018. Pharmacy to be notified via CoverMyMeds

## 2016-02-17 ENCOUNTER — Telehealth: Payer: Self-pay | Admitting: Emergency Medicine

## 2016-02-17 NOTE — Telephone Encounter (Signed)
[employer] called and stated materials for patients wounds have not come in yet. Wanted to know if Dr Jenny Reichmann wanted him to use something else until they came in. He will be back out to see her on Thur. Send something over to the office if Dr Jenny Reichmann wants her to be seen before [facility]. Please follow up thanks.

## 2016-02-18 ENCOUNTER — Other Ambulatory Visit: Payer: Self-pay | Admitting: Internal Medicine

## 2016-02-18 DIAGNOSIS — L98499 Non-pressure chronic ulcer of skin of other sites with unspecified severity: Secondary | ICD-10-CM

## 2016-02-18 NOTE — Telephone Encounter (Signed)
Please advise 

## 2016-02-18 NOTE — Telephone Encounter (Signed)
I would not know what to say for this, except I can try to order a wound care consult per Home health if needed

## 2016-02-19 ENCOUNTER — Other Ambulatory Visit: Payer: Self-pay | Admitting: Internal Medicine

## 2016-02-19 ENCOUNTER — Ambulatory Visit (HOSPITAL_COMMUNITY)
Admission: RE | Admit: 2016-02-19 | Discharge: 2016-02-19 | Disposition: A | Discharge status: Home / Self Care | Payer: Commercial Managed Care - HMO | Source: Ambulatory Visit | Attending: Urology | Admitting: Urology

## 2016-02-19 DIAGNOSIS — I10 Essential (primary) hypertension: Secondary | ICD-10-CM | POA: Insufficient documentation

## 2016-02-19 DIAGNOSIS — I739 Peripheral vascular disease, unspecified: Secondary | ICD-10-CM | POA: Insufficient documentation

## 2016-02-19 DIAGNOSIS — K219 Gastro-esophageal reflux disease without esophagitis: Secondary | ICD-10-CM | POA: Insufficient documentation

## 2016-02-19 DIAGNOSIS — R938 Abnormal findings on diagnostic imaging of other specified body structures: Secondary | ICD-10-CM | POA: Diagnosis not present

## 2016-02-19 DIAGNOSIS — I7789 Other specified disorders of arteries and arterioles: Secondary | ICD-10-CM | POA: Insufficient documentation

## 2016-02-19 DIAGNOSIS — F329 Major depressive disorder, single episode, unspecified: Secondary | ICD-10-CM | POA: Diagnosis not present

## 2016-02-19 DIAGNOSIS — E11319 Type 2 diabetes mellitus with unspecified diabetic retinopathy without macular edema: Secondary | ICD-10-CM | POA: Insufficient documentation

## 2016-02-19 DIAGNOSIS — L98499 Non-pressure chronic ulcer of skin of other sites with unspecified severity: Secondary | ICD-10-CM

## 2016-02-19 DIAGNOSIS — E785 Hyperlipidemia, unspecified: Secondary | ICD-10-CM | POA: Diagnosis not present

## 2016-02-20 DIAGNOSIS — E11621 Type 2 diabetes mellitus with foot ulcer: Secondary | ICD-10-CM | POA: Diagnosis not present

## 2016-02-20 NOTE — Telephone Encounter (Signed)
[employer] is going out to her house today to take care of her wounds.

## 2016-02-25 ENCOUNTER — Encounter: Payer: Self-pay | Admitting: Internal Medicine

## 2016-02-25 ENCOUNTER — Other Ambulatory Visit: Payer: Self-pay | Admitting: Internal Medicine

## 2016-02-25 ENCOUNTER — Encounter: Payer: Commercial Managed Care - HMO | Admitting: Cardiovascular Disease

## 2016-02-25 ENCOUNTER — Other Ambulatory Visit (INDEPENDENT_AMBULATORY_CARE_PROVIDER_SITE_OTHER): Payer: Commercial Managed Care - HMO

## 2016-02-25 ENCOUNTER — Ambulatory Visit (INDEPENDENT_AMBULATORY_CARE_PROVIDER_SITE_OTHER): Payer: Commercial Managed Care - HMO | Admitting: Internal Medicine

## 2016-02-25 ENCOUNTER — Telehealth: Payer: Self-pay

## 2016-02-25 VITALS — BP 124/76 | HR 62 | Temp 98.2°F | Resp 20

## 2016-02-25 DIAGNOSIS — R6889 Other general symptoms and signs: Secondary | ICD-10-CM

## 2016-02-25 DIAGNOSIS — Z0001 Encounter for general adult medical examination with abnormal findings: Secondary | ICD-10-CM

## 2016-02-25 DIAGNOSIS — R42 Dizziness and giddiness: Secondary | ICD-10-CM

## 2016-02-25 DIAGNOSIS — H9121 Sudden idiopathic hearing loss, right ear: Secondary | ICD-10-CM | POA: Insufficient documentation

## 2016-02-25 DIAGNOSIS — E785 Hyperlipidemia, unspecified: Secondary | ICD-10-CM | POA: Diagnosis not present

## 2016-02-25 DIAGNOSIS — E119 Type 2 diabetes mellitus without complications: Secondary | ICD-10-CM

## 2016-02-25 DIAGNOSIS — I10 Essential (primary) hypertension: Secondary | ICD-10-CM

## 2016-02-25 LAB — BASIC METABOLIC PANEL
BUN: 38 mg/dL — ABNORMAL HIGH (ref 6–23)
CALCIUM: 9.6 mg/dL (ref 8.4–10.5)
CO2: 30 mEq/L (ref 19–32)
Chloride: 107 mEq/L (ref 96–112)
Creatinine, Ser: 1.81 mg/dL — ABNORMAL HIGH (ref 0.40–1.20)
GFR: 35.03 mL/min — AB (ref >60.00)
Glucose, Bld: 87 mg/dL (ref 70–99)
Potassium: 4.8 mEq/L (ref 3.5–5.1)
SODIUM: 141 meq/L (ref 135–145)

## 2016-02-25 LAB — HEPATIC FUNCTION PANEL
ALBUMIN: 3.3 g/dL — AB (ref 3.5–5.2)
ALK PHOS: 75 U/L (ref 39–117)
ALT: 12 U/L (ref 0–35)
AST: 15 U/L (ref 0–37)
BILIRUBIN DIRECT: 0.1 mg/dL (ref 0.0–0.3)
Total Bilirubin: 0.3 mg/dL (ref 0.2–1.2)
Total Protein: 6.7 g/dL (ref 6.0–8.3)

## 2016-02-25 LAB — LIPID PANEL
CHOL/HDL RATIO: 4
Cholesterol: 147 mg/dL (ref 0–200)
HDL: 37 mg/dL — ABNORMAL LOW (ref >39.00)
LDL CALC: 91 mg/dL (ref 0–99)
NONHDL: 109.62
Triglycerides: 92 mg/dL (ref 0.0–149.0)
VLDL: 18.4 mg/dL (ref 0.0–40.0)

## 2016-02-25 LAB — HEMOGLOBIN A1C: HEMOGLOBIN A1C: 7.6 % — AB (ref 4.6–6.5)

## 2016-02-25 MED ORDER — MAGNESIUM 250 MG PO TABS: 250.0000 mg | ORAL_TABLET | Freq: Every day | ORAL | 0 refills | Status: DC

## 2016-02-25 MED ORDER — ONETOUCH BASIC SYSTEM W/DEVICE KIT: PACK | 0 refills | Status: DC

## 2016-02-25 MED ORDER — METOPROLOL SUCCINATE ER 100 MG PO TB24: 50.0000 mg | ORAL_TABLET | Freq: Every day | ORAL | 3 refills | Status: DC

## 2016-02-25 MED ORDER — METOPROLOL SUCCINATE ER 50 MG PO TB24: 50.0000 mg | ORAL_TABLET | Freq: Every day | ORAL | 3 refills | Status: DC

## 2016-02-25 MED ORDER — GLUCOSE BLOOD VI STRP: ORAL_STRIP | 11 refills | Status: DC

## 2016-02-25 MED ORDER — METOPROLOL SUCCINATE ER 100 MG PO TB24: 50.0000 mg | ORAL_TABLET | Freq: Every day | ORAL | 0 refills | Status: DC

## 2016-02-25 MED ORDER — MECLIZINE HCL 12.5 MG PO TABS: 12.5000 mg | ORAL_TABLET | Freq: Three times a day (TID) | ORAL | 1 refills | Status: AC | PRN

## 2016-02-25 MED ORDER — ONETOUCH LANCETS MISC: 3 refills | Status: DC

## 2016-02-25 MED ORDER — FUROSEMIDE 40 MG PO TABS: 40.0000 mg | ORAL_TABLET | Freq: Two times a day (BID) | ORAL | 11 refills | Status: DC

## 2016-02-25 NOTE — Progress Notes (Signed)
Subjective:    Patient ID: Holly Weiss, female    DOB: [birth year], 75 y.o.   MRN: [id number]  HPI Here to f/u with family; overall doing ok,  Pt denies chest pain, increasing sob or doe, wheezing, orthopnea, PND, increased LE swelling, palpitations, dizziness or syncope.  Pt denies new neurological symptoms such as new headache, or facial or extremity weakness or numbness.  Pt denies polydipsia, polyuria, or low sugar episode.   Pt denies new neurological symptoms such as new headache, or facial or extremity weakness or numbness.   Pt states overall good compliance with meds, mostly trying to follow appropriate diet, with wt overall stable Wt Readings from Last 3 Encounters:  01/28/16 196 lb (88.9 kg)  12/31/15 195 lb (88.5 kg)  12/13/15 195 lb (88.5 kg)  Has had recent worsening right hearing loss in the past wk, however without pain, fever but also some mild vertigo, no falls. Past Medical History:  Diagnosis Date  . ANEMIA-NOS 01/18/2008  . Arthritis   . 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 ASTHMA, WITH ACUTE EXACERBATION 11/29/2008  . Bilateral carpal tunnel syndrome   . Chronic pain syndrome 01/21/2010  . Depression   . DIABETIC  RETINOPATHY 05/30/2007  . DM, UNCOMPLICATED, TYPE II, UNCONTROLLED 05/30/2007  . Edema of both legs    takes Lasix  . Femur fracture, right ([place])   . GERD (gastroesophageal reflux disease)   . 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 History of blood transfusion    spine surgery  . History of kidney stones   . HYPERLIPIDEMIA 05/30/2007  . HYPERTENSION 10/31/2007  . LOW BACK PAIN 01/18/2008  . Migraine headache   . MORBID OBESITY, HX OF 05/30/2007  . NEPHROLITHIASIS, HX OF 01/18/2008  . Numbness and tingling in hands   . Seasonal allergies   . Shortness of breath    with walking  . SHOULDER PAIN, BILATERAL 04/09/2009  . Spinal stenosis   . Urgency of urination    Past Surgical History:  Procedure Laterality Date  . BACK SURGERY    . CATARACT EXTRACTION    .  CHOLECYSTECTOMY    . lumbar disc surgury    . ORIF TIBIA FRACTURE Right 01/16/2014   Procedure: RIGHT TIBIA REPAIR NON-UNION/MALUNION TIBIA WITH SLIDING GRAFT;  Surgeon: Renette Butters, MD;  Location: [facility];  Service: Orthopedics;  Laterality: Right;  . THORACIC FUSION    . TOTAL KNEE ARTHROPLASTY WITH REVISION COMPONENTS Right 07/12/2013   Procedure: TOTAL KNEE ARTHROPLASTY WITH TIBIA REVISION COMPONENTS;  Surgeon: Ninetta Lights, MD;  Location: [facility];  Service: Orthopedics;  Laterality: Right;  . TUBAL LIGATION      reports that she has never smoked. She does not have any smokeless tobacco history on file. She reports that she does not drink alcohol or use drugs. family history includes Dementia in her mother; Depression in her sister; Diabetes in her mother and sister; Heart disease in her father and mother; Lung cancer in her father. Allergies  Allergen Reactions  . Adhesive [Tape] Itching  . Endocet [Oxycodone-Acetaminophen] Itching  . Latex Itching   Current Outpatient Prescriptions on File Prior to Visit  Medication Sig Dispense Refill  . albuterol (VENTOLIN HFA) 108 (90 Base) MCG/ACT inhaler Inhale 2 puffs into the lungs 2 (two) times daily as needed for wheezing or shortness of breath. 1 Inhaler 11  . aspirin 81 MG tablet Take 1 tablet (81 mg total) by mouth daily. 30 tablet 0  . Calcium Carbonate-Vitamin D (  CALTRATE 600+D PO) Take 2 tablets by mouth daily.    Marland Kitchen doxycycline (VIBRA-TABS) 100 MG tablet Take 1 tablet (100 mg total) by mouth 2 (two) times daily. 20 tablet 0  . insulin aspart (NOVOLOG FLEXPEN) 100 UNIT/ML FlexPen Inject 15 Units into the skin daily. 15 mL 3  . Insulin Glargine (LANTUS SOLOSTAR) 100 UNIT/ML Solostar Pen INJECT 45 UNITS INTO THE SKIN EVERY DAY 15 mL 3  . Insulin Pen Needle (B-D ULTRAFINE III SHORT PEN) 31G X 8 MM MISC Use to check blood sugars twice a day Dx E11.9 100 each 3  . Multiple Vitamins-Minerals (ONE-A-DAY EXTRAS ANTIOXIDANT PO) Take 1 tablet  by mouth daily.     . potassium chloride (K-DUR) 10 MEQ tablet TAKE 1 TABLET BY MOUTH DAILY,(*ONLY ON DAY THE LASIX IS TAKEN*) 180 tablet 3  . QUEtiapine (SEROQUEL) 25 MG tablet Take 1 tablet (25 mg total) by mouth 2 (two) times daily. 180 tablet 1  . rosuvastatin (CRESTOR) 20 MG tablet Take 0.5 tablets (10 mg total) by mouth daily. 30 tablet 11  . sulfamethoxazole-trimethoprim (BACTRIM DS) 800-160 MG tablet Take 1 tablet by mouth 2 (two) times daily. 20 tablet 0  . tiZANidine (ZANAFLEX) 4 MG tablet TAKE 1 TABLET BY MOUTH EVERY 8 HOURS AS NEEDED FOR MUSCLE SPASMS 300 tablet 0  . traMADol (ULTRAM) 50 MG tablet Take 1 tablet (50 mg total) by mouth every 6 (six) hours as needed. 60 tablet 2   No current facility-administered medications on file prior to visit.    Review of Systems  Constitutional: Negative for unusual diaphoresis or night sweats HENT: Negative for ear swelling or discharge Eyes: Negative for worsening visual haziness  Respiratory: Negative for choking and stridor.   Gastrointestinal: Negative for distension or worsening eructation Genitourinary: Negative for retention or change in urine volume.  Musculoskeletal: Negative for other MSK pain or swelling Skin: Negative for color change and worsening wound Neurological: Negative for tremors and numbness other than noted  Psychiatric/Behavioral: Negative for decreased concentration or agitation other than above       Objective:   Physical Exam BP 124/76   Pulse 62   Temp 98.2 F (36.8 C) (Oral)   Resp 20   SpO2 98%  VS noted,  Constitutional: Pt appears in no apparent distress HENT: Head: NCAT.  Right Ear: External ear normal.  Left Ear: External ear normal.  Right canal clear after wax irrigation. Eyes: . Pupils are equal, round, and reactive to light. Conjunctivae and EOM are normal Neck: Normal range of motion. Neck supple.  Cardiovascular: Normal rate and regular rhythm.   Pulmonary/Chest: Effort normal and breath  sounds without rales or wheezing.  Abd:  Soft, NT, ND, + BS Neurological: Pt is alert. Not confused , motor grossly intact Skin: Skin is warm. No rash, no LE edema Psychiatric: Pt behavior is normal. No agitation    Assessment & Plan:

## 2016-02-25 NOTE — Patient Instructions (Signed)
Your right ear was irrigated of wax today  Please take all new medication as prescribed - the meclizine as needed for dizziness  Please continue all other medications as before, and refills were done for the glucometer and supplies, as well as the two metoprolol prescriptions  Please have the pharmacy call with any other refills you may need.  Please continue your efforts at being more active, low cholesterol diet, and weight control.  Please keep your appointments with your specialists as you may have planned  Please go to the LAB in the Basement (turn left off the elevator) for the tests to be done today  You will be contacted by phone if any changes need to be made immediately.  Otherwise, you will receive a letter about your results with an explanation, but please check with MyChart first.  Please remember to sign up for MyChart if you have not done so, as this will be important to you in the future with finding out test results, communicating by private email, and scheduling acute appointments online when needed.  Please return in 6 months, or sooner if needed, with Lab testing done 3-5 days before

## 2016-02-25 NOTE — Telephone Encounter (Signed)
Medication refill sent to pharmacy  

## 2016-02-25 NOTE — Telephone Encounter (Signed)
Pharmacy left msg on triage stating they have received two dosage on pt metoprolol needing to clarify which dosage pt should be taking...Holly Weiss

## 2016-02-25 NOTE — Telephone Encounter (Signed)
Pt is taking both, verified with family today

## 2016-02-25 NOTE — Progress Notes (Signed)
Pre visit review using our clinic review tool, if applicable. No additional management support is needed unless otherwise documented below in the visit note. 

## 2016-02-25 NOTE — Telephone Encounter (Signed)
Called [facility] spoke w/Matt gave md response...lmb

## 2016-02-27 ENCOUNTER — Telehealth: Payer: Self-pay | Admitting: Emergency Medicine

## 2016-02-27 DIAGNOSIS — E11621 Type 2 diabetes mellitus with foot ulcer: Secondary | ICD-10-CM | POA: Diagnosis not present

## 2016-02-27 NOTE — Telephone Encounter (Signed)
Home Health is requesting verbal orders for speech therapy and also wanted to make you aware that the patient seem to be more confused. Please follow up thanks.

## 2016-02-28 ENCOUNTER — Telehealth: Payer: Self-pay | Admitting: Emergency Medicine

## 2016-02-28 NOTE — Telephone Encounter (Signed)
[facility] returned call for verbal orders. Please call back thanks.

## 2016-02-28 NOTE — Telephone Encounter (Signed)
Verbals have been given for patient.

## 2016-02-28 NOTE — Telephone Encounter (Signed)
Ok for verbals 

## 2016-03-02 NOTE — Assessment & Plan Note (Signed)
Improved after wax impaction irrigation,  to f/u any worsening symptoms or concerns 

## 2016-03-02 NOTE — Assessment & Plan Note (Signed)
stable overall by history and exam, recent data reviewed with pt, and pt to continue medical treatment as before,  to f/u any worsening symptoms or concerns BP Readings from Last 3 Encounters:  02/25/16 124/76  01/28/16 132/76  12/31/15 140/80

## 2016-03-02 NOTE — Assessment & Plan Note (Signed)
Lab Results  Component Value Date   LDLCALC 91 02/25/2016   stable overall by history and exam, recent data reviewed with pt, and pt to continue medical treatment as before,  to f/u any worsening symptoms or concerns

## 2016-03-02 NOTE — Assessment & Plan Note (Signed)
stable overall by history and exam, recent data reviewed with pt, and pt to continue medical treatment as before,  to f/u any worsening symptoms or concerns Lab Results  Component Value Date   HGBA1C 7.6 (H) 02/25/2016

## 2016-03-02 NOTE — Assessment & Plan Note (Signed)
[place] for meclizine prn for likely peripheral vertigo

## 2016-03-03 ENCOUNTER — Other Ambulatory Visit: Payer: Self-pay | Admitting: Internal Medicine

## 2016-03-03 DIAGNOSIS — I10 Essential (primary) hypertension: Secondary | ICD-10-CM

## 2016-03-05 ENCOUNTER — Encounter (HOSPITAL_BASED_OUTPATIENT_CLINIC_OR_DEPARTMENT_OTHER): Payer: Commercial Managed Care - HMO | Attending: Internal Medicine

## 2016-03-05 DIAGNOSIS — L97221 Non-pressure chronic ulcer of left calf limited to breakdown of skin: Secondary | ICD-10-CM | POA: Diagnosis not present

## 2016-03-05 DIAGNOSIS — J45909 Unspecified asthma, uncomplicated: Secondary | ICD-10-CM | POA: Diagnosis not present

## 2016-03-05 DIAGNOSIS — H409 Unspecified glaucoma: Secondary | ICD-10-CM | POA: Insufficient documentation

## 2016-03-05 DIAGNOSIS — I779 Disorder of arteries and arterioles, unspecified: Secondary | ICD-10-CM | POA: Insufficient documentation

## 2016-03-05 DIAGNOSIS — I10 Essential (primary) hypertension: Secondary | ICD-10-CM | POA: Insufficient documentation

## 2016-03-05 DIAGNOSIS — L97821 Non-pressure chronic ulcer of other part of left lower leg limited to breakdown of skin: Secondary | ICD-10-CM | POA: Diagnosis not present

## 2016-03-05 DIAGNOSIS — E11622 Type 2 diabetes mellitus with other skin ulcer: Secondary | ICD-10-CM | POA: Insufficient documentation

## 2016-03-05 DIAGNOSIS — E11621 Type 2 diabetes mellitus with foot ulcer: Secondary | ICD-10-CM | POA: Insufficient documentation

## 2016-03-11 NOTE — Telephone Encounter (Signed)
i

## 2016-03-12 ENCOUNTER — Ambulatory Visit (HOSPITAL_COMMUNITY): Payer: Commercial Managed Care - HMO

## 2016-03-13 DIAGNOSIS — E11621 Type 2 diabetes mellitus with foot ulcer: Secondary | ICD-10-CM | POA: Diagnosis not present

## 2016-03-20 DIAGNOSIS — E11621 Type 2 diabetes mellitus with foot ulcer: Secondary | ICD-10-CM | POA: Diagnosis not present

## 2016-03-24 ENCOUNTER — Telehealth: Payer: Self-pay

## 2016-03-24 NOTE — Telephone Encounter (Signed)
Verbal Orders - Wound care back on legs  1x a week for 9 Weeks  [phone] - Cecille Rubin

## 2016-03-25 NOTE — Telephone Encounter (Signed)
Cecille Rubin called to check up on this request. Please call her back

## 2016-03-25 NOTE — Telephone Encounter (Signed)
Verbals given  

## 2016-03-27 DIAGNOSIS — E11621 Type 2 diabetes mellitus with foot ulcer: Secondary | ICD-10-CM | POA: Diagnosis not present

## 2016-04-03 ENCOUNTER — Encounter (HOSPITAL_BASED_OUTPATIENT_CLINIC_OR_DEPARTMENT_OTHER): Payer: Commercial Managed Care - HMO | Attending: Internal Medicine

## 2016-04-03 DIAGNOSIS — I10 Essential (primary) hypertension: Secondary | ICD-10-CM | POA: Insufficient documentation

## 2016-04-03 DIAGNOSIS — L97221 Non-pressure chronic ulcer of left calf limited to breakdown of skin: Secondary | ICD-10-CM | POA: Diagnosis not present

## 2016-04-03 DIAGNOSIS — E11622 Type 2 diabetes mellitus with other skin ulcer: Secondary | ICD-10-CM | POA: Insufficient documentation

## 2016-04-07 ENCOUNTER — Telehealth: Payer: Self-pay | Admitting: Internal Medicine

## 2016-04-07 NOTE — Telephone Encounter (Signed)
pramod is calling for verbals on PT  4 week 2

## 2016-04-07 NOTE — Telephone Encounter (Signed)
Ok for verbals 

## 2016-04-08 ENCOUNTER — Other Ambulatory Visit: Payer: Self-pay

## 2016-04-08 ENCOUNTER — Ambulatory Visit (HOSPITAL_COMMUNITY): Payer: Commercial Managed Care - HMO | Attending: Internal Medicine

## 2016-04-08 DIAGNOSIS — R609 Edema, unspecified: Secondary | ICD-10-CM | POA: Insufficient documentation

## 2016-04-08 DIAGNOSIS — I340 Nonrheumatic mitral (valve) insufficiency: Secondary | ICD-10-CM | POA: Insufficient documentation

## 2016-04-08 DIAGNOSIS — I5031 Acute diastolic (congestive) heart failure: Secondary | ICD-10-CM | POA: Diagnosis present

## 2016-04-08 DIAGNOSIS — R079 Chest pain, unspecified: Secondary | ICD-10-CM | POA: Insufficient documentation

## 2016-04-08 DIAGNOSIS — D649 Anemia, unspecified: Secondary | ICD-10-CM | POA: Diagnosis not present

## 2016-04-08 DIAGNOSIS — Z6837 Body mass index (BMI) 37.0-37.9, adult: Secondary | ICD-10-CM | POA: Diagnosis not present

## 2016-04-08 DIAGNOSIS — Z951 Presence of aortocoronary bypass graft: Secondary | ICD-10-CM | POA: Insufficient documentation

## 2016-04-08 DIAGNOSIS — I110 Hypertensive heart disease with heart failure: Secondary | ICD-10-CM | POA: Diagnosis not present

## 2016-04-08 DIAGNOSIS — E119 Type 2 diabetes mellitus without complications: Secondary | ICD-10-CM | POA: Insufficient documentation

## 2016-04-19 ENCOUNTER — Other Ambulatory Visit: Payer: Self-pay | Admitting: Internal Medicine

## 2016-04-22 ENCOUNTER — Telehealth: Payer: Self-pay

## 2016-04-22 NOTE — Telephone Encounter (Signed)
Verbals given  

## 2016-04-22 NOTE — Telephone Encounter (Signed)
Ok for verbals 

## 2016-04-22 NOTE — Telephone Encounter (Signed)
Holly Weiss [id number]   Jenetta Downer Orders   To treat L lower leg. It has blisters on it. Last week the R lower leg was that.   Please follow up, Thank you.

## 2016-07-01 ENCOUNTER — Other Ambulatory Visit: Payer: Self-pay | Admitting: Internal Medicine

## 2016-07-01 ENCOUNTER — Other Ambulatory Visit: Payer: Self-pay | Admitting: *Deleted

## 2016-07-01 DIAGNOSIS — I10 Essential (primary) hypertension: Secondary | ICD-10-CM

## 2016-07-01 MED ORDER — INSULIN GLARGINE 100 UNIT/ML SOLOSTAR PEN: PEN_INJECTOR | SUBCUTANEOUS | 3 refills | Status: DC

## 2016-07-01 MED ORDER — INSULIN ASPART 100 UNIT/ML FLEXPEN: 15.0000 [IU] | PEN_INJECTOR | Freq: Every day | SUBCUTANEOUS | 3 refills | Status: DC

## 2016-07-01 MED ORDER — METOPROLOL SUCCINATE ER 100 MG PO TB24: 50.0000 mg | ORAL_TABLET | Freq: Every day | ORAL | 5 refills | Status: DC

## 2016-07-29 ENCOUNTER — Other Ambulatory Visit: Payer: Self-pay | Admitting: Internal Medicine

## 2016-08-26 ENCOUNTER — Ambulatory Visit: Payer: Self-pay | Admitting: Internal Medicine

## 2016-08-26 DIAGNOSIS — Z48812 Encounter for surgical aftercare following surgery on the circulatory system: Secondary | ICD-10-CM | POA: Diagnosis not present

## 2016-09-01 ENCOUNTER — Encounter: Payer: Self-pay | Admitting: Internal Medicine

## 2016-09-05 ENCOUNTER — Other Ambulatory Visit: Payer: Self-pay | Admitting: Internal Medicine

## 2016-09-08 ENCOUNTER — Ambulatory Visit: Payer: Self-pay | Admitting: Internal Medicine

## 2016-09-24 ENCOUNTER — Other Ambulatory Visit: Payer: Self-pay | Admitting: Internal Medicine

## 2016-09-24 ENCOUNTER — Encounter: Payer: Self-pay | Admitting: Internal Medicine

## 2016-09-24 ENCOUNTER — Ambulatory Visit (INDEPENDENT_AMBULATORY_CARE_PROVIDER_SITE_OTHER): Payer: Medicare Other | Admitting: Internal Medicine

## 2016-09-24 ENCOUNTER — Other Ambulatory Visit (INDEPENDENT_AMBULATORY_CARE_PROVIDER_SITE_OTHER): Payer: Medicare Other

## 2016-09-24 VITALS — BP 122/64 | HR 68 | Temp 97.6°F | Wt 214.0 lb

## 2016-09-24 DIAGNOSIS — Z0001 Encounter for general adult medical examination with abnormal findings: Secondary | ICD-10-CM

## 2016-09-24 DIAGNOSIS — I5032 Chronic diastolic (congestive) heart failure: Secondary | ICD-10-CM | POA: Diagnosis not present

## 2016-09-24 DIAGNOSIS — Z23 Encounter for immunization: Secondary | ICD-10-CM | POA: Diagnosis not present

## 2016-09-24 DIAGNOSIS — N183 Chronic kidney disease, stage 3 unspecified: Secondary | ICD-10-CM | POA: Insufficient documentation

## 2016-09-24 DIAGNOSIS — E119 Type 2 diabetes mellitus without complications: Secondary | ICD-10-CM | POA: Diagnosis not present

## 2016-09-24 DIAGNOSIS — R21 Rash and other nonspecific skin eruption: Secondary | ICD-10-CM | POA: Insufficient documentation

## 2016-09-24 LAB — BASIC METABOLIC PANEL
BUN: 44 mg/dL — ABNORMAL HIGH (ref 6–23)
CALCIUM: 9.6 mg/dL (ref 8.4–10.5)
CO2: 26 mEq/L (ref 19–32)
Chloride: 108 mEq/L (ref 96–112)
Creatinine, Ser: 1.2 mg/dL (ref 0.40–1.20)
GFR: 56.21 mL/min — AB (ref >60.00)
Glucose, Bld: 163 mg/dL — ABNORMAL HIGH (ref 70–99)
Potassium: 4.4 mEq/L (ref 3.5–5.1)
Sodium: 138 mEq/L (ref 135–145)

## 2016-09-24 LAB — URINALYSIS, ROUTINE W REFLEX MICROSCOPIC
BILIRUBIN URINE: NEGATIVE
HGB URINE DIPSTICK: NEGATIVE
KETONES UR: NEGATIVE
LEUKOCYTES UA: NEGATIVE
NITRITE: NEGATIVE
PH: 5.5 (ref 5.0–8.0)
RBC / HPF: NONE SEEN (ref 0–?)
Specific Gravity, Urine: 1.02 (ref 1.000–1.030)
Total Protein, Urine: NEGATIVE
UROBILINOGEN UA: 0.2 (ref 0.0–1.0)
Urine Glucose: NEGATIVE

## 2016-09-24 LAB — CBC WITH DIFFERENTIAL/PLATELET
BASOS ABS: 0 10*3/uL (ref 0.0–0.1)
Basophils Relative: 0.5 % (ref 0.0–3.0)
EOS PCT: 2.5 % (ref 0.0–5.0)
Eosinophils Absolute: 0.1 10*3/uL (ref 0.0–0.7)
HEMATOCRIT: 34.1 % — AB (ref 36.0–46.0)
HEMOGLOBIN: 11.5 g/dL — AB (ref 12.0–15.0)
LYMPHS ABS: 1.4 10*3/uL (ref 0.7–4.0)
Lymphocytes Relative: 25.4 % (ref 12.0–46.0)
MCHC: 33.6 g/dL (ref 30.0–36.0)
MCV: 90.9 fl (ref 78.0–100.0)
MONOS PCT: 10.1 % (ref 3.0–12.0)
Monocytes Absolute: 0.6 10*3/uL (ref 0.1–1.0)
NEUTROS PCT: 61.5 % (ref 43.0–77.0)
Neutro Abs: 3.4 10*3/uL (ref 1.4–7.7)
Platelets: 181 10*3/uL (ref 150.0–400.0)
RBC: 3.76 Mil/uL — AB (ref 3.87–5.11)
RDW: 15.1 % (ref 11.5–15.5)
WBC: 5.5 10*3/uL (ref 4.0–10.5)

## 2016-09-24 LAB — HEMOGLOBIN A1C: Hgb A1c MFr Bld: 10.7 % — ABNORMAL HIGH (ref 4.6–6.5)

## 2016-09-24 LAB — TSH: TSH: 1.35 u[IU]/mL (ref 0.35–4.50)

## 2016-09-24 LAB — LIPID PANEL
CHOL/HDL RATIO: 3
Cholesterol: 168 mg/dL (ref 0–200)
HDL: 48.4 mg/dL (ref >39.00)
LDL Cholesterol: 92 mg/dL (ref 0–99)
NONHDL: 119.13
Triglycerides: 136 mg/dL (ref 0.0–149.0)
VLDL: 27.2 mg/dL (ref 0.0–40.0)

## 2016-09-24 LAB — HEPATIC FUNCTION PANEL
ALBUMIN: 3.9 g/dL (ref 3.5–5.2)
ALT: 14 U/L (ref 0–35)
AST: 18 U/L (ref 0–37)
Alkaline Phosphatase: 80 U/L (ref 39–117)
Bilirubin, Direct: 0.1 mg/dL (ref 0.0–0.3)
TOTAL PROTEIN: 7.3 g/dL (ref 6.0–8.3)
Total Bilirubin: 0.2 mg/dL (ref 0.2–1.2)

## 2016-09-24 LAB — MICROALBUMIN / CREATININE URINE RATIO
Creatinine,U: 101.1 mg/dL
MICROALB UR: 2.3 mg/dL — AB (ref 0.0–1.9)
Microalb Creat Ratio: 2.3 mg/g (ref 0.0–30.0)

## 2016-09-24 MED ORDER — FUROSEMIDE 40 MG PO TABS: 40.0000 mg | ORAL_TABLET | Freq: Two times a day (BID) | ORAL | 11 refills | Status: DC

## 2016-09-24 MED ORDER — INSULIN LISPRO 100 UNIT/ML (KWIKPEN): PEN_INJECTOR | SUBCUTANEOUS | 11 refills | Status: DC

## 2016-09-24 NOTE — Progress Notes (Signed)
Subjective:    Patient ID: Holly Weiss, female    DOB: 06/25/1941, 76 y.o.   MRN: [id number]  HPI  Here for wellness and f/u;  Overall doing ok;  Pt denies Chest pain, worsening SOB, DOE, wheezing, orthopnea, PND, palpitations, dizziness or syncope.  Pt denies neurological change such as new headache, facial or extremity weakness.  Pt denies polydipsia, polyuria, or low sugar symptoms. Pt states overall good compliance with treatment and medications, good tolerability, and has been trying to follow appropriate diet.  Pt denies worsening depressive symptoms, suicidal ideation or panic. No fever, night sweats, wt loss, loss of appetite, or other constitutional symptoms.  Pt states good ability with ADL's, has low fall risk, home safety reviewed and adequate, no other significant changes in hearing or vision, and only occasionally active with exercise  In fact has gained much weight. Wt Readings from Last 3 Encounters:  09/24/16 214 lb (97.1 kg)  01/28/16 196 lb (88.9 kg)  12/31/15 195 lb (88.5 kg)  Leg swelling mild worse, Only taking lasix 20 qd instead of the 40 bid due to the diuretic.  novolog no longer covered with her insurance. Bilat knee with intermittent and now bowing inwards. Due for flu shot, and due optho  Has a persistent rash to bilat LE's without pain but with redness, ongoing wtihout improvement for over 6 mo, asks for f/u with derm.   Past Medical History:  Diagnosis Date  . ANEMIA-NOS 01/18/2008  . Arthritis   . 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 ASTHMA, WITH ACUTE EXACERBATION 11/29/2008  . Bilateral carpal tunnel syndrome   . Chronic pain syndrome 01/21/2010  . Depression   . DIABETIC  RETINOPATHY 05/30/2007  . DM, UNCOMPLICATED, TYPE II, UNCONTROLLED 05/30/2007  . Edema of both legs    takes Lasix  . Femur fracture, right ([place])   . GERD (gastroesophageal reflux disease)   . 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 History of blood transfusion    spine surgery  . History of  kidney stones   . HYPERLIPIDEMIA 05/30/2007  . HYPERTENSION 10/31/2007  . LOW BACK PAIN 01/18/2008  . Migraine headache   . MORBID OBESITY, HX OF 05/30/2007  . NEPHROLITHIASIS, HX OF 01/18/2008  . Numbness and tingling in hands   . Seasonal allergies   . Shortness of breath    with walking  . SHOULDER PAIN, BILATERAL 04/09/2009  . Spinal stenosis   . Urgency of urination    Past Surgical History:  Procedure Laterality Date  . BACK SURGERY    . CATARACT EXTRACTION    . CHOLECYSTECTOMY    . lumbar disc surgury    . ORIF TIBIA FRACTURE Right 01/16/2014   Procedure: RIGHT TIBIA REPAIR NON-UNION/MALUNION TIBIA WITH SLIDING GRAFT;  Surgeon: Renette Butters, MD;  Location: [facility];  Service: Orthopedics;  Laterality: Right;  . THORACIC FUSION    . TOTAL KNEE ARTHROPLASTY WITH REVISION COMPONENTS Right 07/12/2013   Procedure: TOTAL KNEE ARTHROPLASTY WITH TIBIA REVISION COMPONENTS;  Surgeon: Ninetta Lights, MD;  Location: [facility];  Service: Orthopedics;  Laterality: Right;  . TUBAL LIGATION      reports that she has never smoked. She uses smokeless tobacco. She reports that she does not drink alcohol or use drugs. family history includes Dementia in her mother; Depression in her sister; Diabetes in her mother and sister; Heart disease in her father and mother; Lung cancer in her father. Allergies  Allergen Reactions  . Adhesive [Tape] Itching  .  Endocet [Oxycodone-Acetaminophen] Itching  . Latex Itching  . Oxycodone-Acetaminophen Itching   Current Outpatient Prescriptions on File Prior to Visit  Medication Sig Dispense Refill  . albuterol (VENTOLIN HFA) 108 (90 Base) MCG/ACT inhaler Inhale 2 puffs into the lungs 2 (two) times daily as needed for wheezing or shortness of breath. 1 Inhaler 11  . aspirin 81 MG tablet Take 1 tablet (81 mg total) by mouth daily. 30 tablet 0  . B-D ULTRAFINE III SHORT PEN 31G X 8 MM MISC USE TO CHECK BLOOD SUGERS TWICE DAILY 100 each 0  . Blood Glucose Monitoring  Suppl ([place]) w/Device KIT Use as directed once daily to check blood sugar.  Diagnosis code 250.02 1 each 0  . Calcium Carbonate-Vitamin D (CALTRATE 600+D PO) Take 2 tablets by mouth daily.    Marland Kitchen doxycycline (VIBRA-TABS) 100 MG tablet Take 1 tablet (100 mg total) by mouth 2 (two) times daily. 20 tablet 0  . glucose blood (ONE TOUCH TEST STRIPS) test strip Use as directed once daily to check blood sugar.  Diagnosis code E11.9 100 each 11  . Insulin Glargine (LANTUS SOLOSTAR) 100 UNIT/ML Solostar Pen INJECT 45 UNITS INTO THE SKIN EVERY DAY 15 mL 3  . Magnesium 250 MG TABS Take 1 tablet (250 mg total) by mouth daily. 90 tablet 0  . meclizine (ANTIVERT) 12.5 MG tablet Take 1 tablet (12.5 mg total) by mouth 3 (three) times daily as needed for dizziness. 40 tablet 1  . metoprolol succinate (TOPROL-XL) 100 MG 24 hr tablet TAKE 1 TABLET BY MOUTH DAILY. TAKE WITH SOMETHING OR IMMEDIATELY FOLLOWING A MEAL 90 tablet 5  . Multiple Vitamins-Minerals (ONE-A-DAY EXTRAS ANTIOXIDANT PO) Take 1 tablet by mouth daily.     . ONE TOUCH LANCETS MISC Use as directed once daily to check blood sugar. DX E11.09 100 each 3  . potassium chloride (K-DUR) 10 MEQ tablet TAKE 1 TABLET BY MOUTH DAILY,(*ONLY ON DAY THE LASIX IS TAKEN*) 180 tablet 3  . QUEtiapine (SEROQUEL) 25 MG tablet Take 1 tablet (25 mg total) by mouth 2 (two) times daily. 180 tablet 1  . rosuvastatin (CRESTOR) 20 MG tablet Take 0.5 tablets (10 mg total) by mouth daily. 30 tablet 11  . sulfamethoxazole-trimethoprim (BACTRIM DS) 800-160 MG tablet Take 1 tablet by mouth 2 (two) times daily. 20 tablet 0  . tiZANidine (ZANAFLEX) 4 MG tablet TAKE 1 TABLET BY MOUTH EVERY 8 HOURS AS NEEDED FOR MUSCLE SPASMS 300 tablet 0  . traMADol (ULTRAM) 50 MG tablet Take 1 tablet (50 mg total) by mouth every 6 (six) hours as needed. 60 tablet 2  . metoprolol succinate (TOPROL-XL) 50 MG 24 hr tablet Take 1 tablet (50 mg total) by mouth daily. 90 tablet 3   No current  facility-administered medications on file prior to visit.    Review of Systems Constitutional: Negative for increased diaphoresis, or other activity, appetite or siginficant weight change other than noted HENT: Negative for worsening hearing loss, ear pain, facial swelling, mouth sores and neck stiffness.   Eyes: Negative for other worsening pain, redness or visual disturbance.  Respiratory: Negative for choking or stridor Cardiovascular: Negative for other chest pain and palpitations.  Gastrointestinal: Negative for worsening diarrhea, blood in stool, or abdominal distention Genitourinary: Negative for hematuria, flank pain or change in urine volume.  Musculoskeletal: Negative for myalgias or other joint complaints.  Skin: Negative for other color change and wound or drainage.  Neurological: Negative for syncope and numbness. other than  noted Hematological: Negative for adenopathy. or other swelling Psychiatric/Behavioral: Negative for hallucinations, SI, self-injury, decreased concentration or other worsening agitation.  All other system neg per pt    Objective:   Physical Exam BP 122/64   Pulse 68   Temp 97.6 F (36.4 C)   Wt 214 lb (97.1 kg)   SpO2 98%   BMI 40.43 kg/m  VS noted,  Constitutional: Pt is oriented to person, place, and time. Appears well-developed and well-nourished, in no significant distress Head: Normocephalic and atraumatic  Eyes: Conjunctivae and EOM are normal. Pupils are equal, round, and reactive to light Right Ear: External ear normal.  Left Ear: External ear normal Nose: Nose normal.  Mouth/Throat: Oropharynx is clear and moist  Neck: Normal range of motion. Neck supple. No JVD present. No tracheal deviation present or significant neck LA or mass Cardiovascular: Normal rate, regular rhythm, normal heart sounds and intact distal pulses.   Pulmonary/Chest: Effort normal and breath sounds without rales or wheezing  Abdominal: Soft. Bowel sounds are  normal. NT. No HSM  Musculoskeletal: Normal range of motion Lymphadenopathy: Has no cervical adenopathy.  Neurological: Pt is alert and oriented to person, place, and time. Pt has normal reflexes. No cranial nerve deficit. Motor grossly intact Skin: Skin is warm and dry. Diffuse nontender erythem/brown rash noted to bilat LE's, no new ulcers Psychiatric:  Has normal mood and affect. Behavior is normal.  2+ LE edema to knees  Lab Results  Component Value Date   WBC 5.5 09/24/2016   HGB 11.5 (L) 09/24/2016   HCT 34.1 (L) 09/24/2016   PLT 181.0 09/24/2016   GLUCOSE 163 (H) 09/24/2016   CHOL 168 09/24/2016   TRIG 136.0 09/24/2016   HDL 48.40 09/24/2016   LDLCALC 92 09/24/2016   ALT 14 09/24/2016   AST 18 09/24/2016   NA 138 09/24/2016   K 4.4 09/24/2016   CL 108 09/24/2016   CREATININE 1.20 09/24/2016   BUN 44 (H) 09/24/2016   CO2 26 09/24/2016   TSH 1.35 09/24/2016   INR 1.11 01/15/2014   HGBA1C 10.7 (H) 09/24/2016   MICROALBUR 2.3 (H) 09/24/2016       Assessment & Plan:

## 2016-09-24 NOTE — Assessment & Plan Note (Signed)
With mild worsening now with GFR 35 at last visit, for f/u, consider renal referral, cont to avoid nephrotoxic meds

## 2016-09-24 NOTE — Patient Instructions (Addendum)
You had the flu shot today  Please rememeber to call for you yearly eye exam.    OK to increase the lasix to 40 mg in the AM, and you can take a second 40 mg in the PM if needed  OK to change to Novolog to Humalog;  Please have the pharmacy contact us with a similar alternative medication if this is not covered by your insurance  Please continue all other medications as before, and refills have been done if requested.  Please have the pharmacy call with any other refills you may need.  Please continue your efforts at being more active, low cholesterol diet, and weight control.  You are otherwise up to date with prevention measures today.  Please keep your appointments with your specialists as you may have planned  You will be contacted regarding the referral for: Dermatology - Dr Delman Cheadle  Please go to the LAB in the Basement (turn left off the elevator) for the tests to be done today  You will be contacted by phone if any changes need to be made immediately.  Otherwise, you will receive a letter about your results with an explanation, but please check with MyChart first.  Please remember to sign up for MyChart if you have not done so, as this will be important to you in the future with finding out test results, communicating by private email, and scheduling acute appointments online when needed.  Please return in 6 months, or sooner if needed, with Lab testing done 3-5 days before

## 2016-09-26 DIAGNOSIS — Z48812 Encounter for surgical aftercare following surgery on the circulatory system: Secondary | ICD-10-CM | POA: Diagnosis not present

## 2016-09-27 NOTE — Assessment & Plan Note (Signed)
Stable by hx, for a1c today, ok to change the novolog to humalog

## 2016-09-27 NOTE — Assessment & Plan Note (Signed)
?   Stasis dermatitis v other  - for derm referral

## 2016-09-27 NOTE — Assessment & Plan Note (Addendum)
With wt gain and worsening edema, for increase lasix to 40 qd,  to f/u any worsening symptoms or concerns  In addition to the time spent performing CPE, I spent an additional 15 minutes face to face,in which greater than 50% of this time was spent in counseling and coordination of care for patient's illness as documented.

## 2016-09-27 NOTE — Assessment & Plan Note (Signed)

## 2016-09-28 ENCOUNTER — Encounter: Payer: Self-pay | Admitting: Internal Medicine

## 2016-10-24 DIAGNOSIS — Z48812 Encounter for surgical aftercare following surgery on the circulatory system: Secondary | ICD-10-CM | POA: Diagnosis not present

## 2016-11-06 DIAGNOSIS — I872 Venous insufficiency (chronic) (peripheral): Secondary | ICD-10-CM | POA: Insufficient documentation

## 2016-11-06 DIAGNOSIS — I8311 Varicose veins of right lower extremity with inflammation: Secondary | ICD-10-CM | POA: Diagnosis not present

## 2016-11-06 DIAGNOSIS — L853 Xerosis cutis: Secondary | ICD-10-CM | POA: Diagnosis not present

## 2016-11-06 DIAGNOSIS — I8312 Varicose veins of left lower extremity with inflammation: Secondary | ICD-10-CM | POA: Diagnosis not present

## 2016-11-24 DIAGNOSIS — Z48812 Encounter for surgical aftercare following surgery on the circulatory system: Secondary | ICD-10-CM | POA: Diagnosis not present

## 2016-12-09 ENCOUNTER — Other Ambulatory Visit: Payer: Self-pay | Admitting: Internal Medicine

## 2016-12-23 ENCOUNTER — Telehealth: Payer: Self-pay | Admitting: Internal Medicine

## 2016-12-23 NOTE — Telephone Encounter (Signed)
Please advise on the insulin medications.

## 2016-12-23 NOTE — Telephone Encounter (Signed)
These meds do not cause diarrhea  OK to cont meds as is  Consider OV if not improved in next few days, or if fever, pain, or other unusual symtpoms

## 2016-12-23 NOTE — Telephone Encounter (Signed)
Pt would like a call back regarding her insulin. She states insulin lispro (HUMALOG KWIKPEN) 100 UNIT/ML KiwkPen and Insulin Glargine (LANTUS SOLOSTAR) 100 UNIT/ML Solostar Pen Together have been giving her diarrhea.   Also she received a letter stating she had aids, would like clarification on that. States the letter did not have a company name or anything about where it came from.

## 2016-12-24 DIAGNOSIS — Z48812 Encounter for surgical aftercare following surgery on the circulatory system: Secondary | ICD-10-CM | POA: Diagnosis not present

## 2016-12-24 NOTE — Telephone Encounter (Signed)
Called pt, phone only rings, no VM comes on.

## 2017-01-24 DIAGNOSIS — Z48812 Encounter for surgical aftercare following surgery on the circulatory system: Secondary | ICD-10-CM | POA: Diagnosis not present

## 2017-02-02 ENCOUNTER — Other Ambulatory Visit: Payer: Self-pay | Admitting: Internal Medicine

## 2017-02-23 DIAGNOSIS — Z48812 Encounter for surgical aftercare following surgery on the circulatory system: Secondary | ICD-10-CM | POA: Diagnosis not present

## 2017-03-24 ENCOUNTER — Ambulatory Visit: Payer: Self-pay | Admitting: Internal Medicine

## 2017-03-26 DIAGNOSIS — Z48812 Encounter for surgical aftercare following surgery on the circulatory system: Secondary | ICD-10-CM | POA: Diagnosis not present

## 2017-04-02 ENCOUNTER — Other Ambulatory Visit: Payer: Self-pay | Admitting: Internal Medicine

## 2017-04-19 ENCOUNTER — Telehealth: Payer: Self-pay | Admitting: Internal Medicine
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
## 2017-04-26 DIAGNOSIS — Z48812 Encounter for surgical aftercare following surgery on the circulatory system: Secondary | ICD-10-CM | POA: Diagnosis not present

## 2017-05-03 NOTE — Telephone Encounter (Signed)
Please inform patient we have waived the NS fee for [facility] 03/24/17 as a courtesy.

## 2017-05-11 ENCOUNTER — Telehealth: Payer: Self-pay

## 2017-05-11 NOTE — Telephone Encounter (Signed)
error 

## 2017-05-12 ENCOUNTER — Ambulatory Visit: Payer: Self-pay | Admitting: Internal Medicine

## 2017-05-14 ENCOUNTER — Encounter: Payer: Self-pay | Admitting: Internal Medicine

## 2017-05-14 ENCOUNTER — Ambulatory Visit (INDEPENDENT_AMBULATORY_CARE_PROVIDER_SITE_OTHER): Payer: Medicare Other | Admitting: Internal Medicine

## 2017-05-14 ENCOUNTER — Other Ambulatory Visit: Payer: Self-pay | Admitting: Internal Medicine

## 2017-05-14 ENCOUNTER — Other Ambulatory Visit (INDEPENDENT_AMBULATORY_CARE_PROVIDER_SITE_OTHER): Payer: Medicare Other

## 2017-05-14 VITALS — BP 132/86 | HR 60 | Temp 97.8°F | Ht 61.0 in

## 2017-05-14 DIAGNOSIS — E119 Type 2 diabetes mellitus without complications: Secondary | ICD-10-CM

## 2017-05-14 DIAGNOSIS — Z23 Encounter for immunization: Secondary | ICD-10-CM

## 2017-05-14 DIAGNOSIS — N183 Chronic kidney disease, stage 3 unspecified: Secondary | ICD-10-CM

## 2017-05-14 DIAGNOSIS — J4520 Mild intermittent asthma, uncomplicated: Secondary | ICD-10-CM | POA: Diagnosis not present

## 2017-05-14 DIAGNOSIS — I10 Essential (primary) hypertension: Secondary | ICD-10-CM

## 2017-05-14 LAB — HEPATIC FUNCTION PANEL
ALBUMIN: 3.9 g/dL (ref 3.5–5.2)
ALT: 12 U/L (ref 0–35)
AST: 16 U/L (ref 0–37)
Alkaline Phosphatase: 100 U/L (ref 39–117)
BILIRUBIN DIRECT: 0.1 mg/dL (ref 0.0–0.3)
TOTAL PROTEIN: 7.4 g/dL (ref 6.0–8.3)
Total Bilirubin: 0.4 mg/dL (ref 0.2–1.2)

## 2017-05-14 LAB — BASIC METABOLIC PANEL
BUN: 34 mg/dL — ABNORMAL HIGH (ref 6–23)
CALCIUM: 9.1 mg/dL (ref 8.4–10.5)
CHLORIDE: 108 meq/L (ref 96–112)
CO2: 27 meq/L (ref 19–32)
CREATININE: 1.08 mg/dL (ref 0.40–1.20)
GFR: 63.36 mL/min (ref >60.00)
GLUCOSE: 95 mg/dL (ref 70–99)
Potassium: 4.1 mEq/L (ref 3.5–5.1)
Sodium: 141 mEq/L (ref 135–145)

## 2017-05-14 LAB — LIPID PANEL
Cholesterol: 178 mg/dL (ref 0–200)
HDL: 44.5 mg/dL (ref >39.00)
LDL Cholesterol: 113 mg/dL — ABNORMAL HIGH (ref 0–99)
NonHDL: 133.06
Total CHOL/HDL Ratio: 4
Triglycerides: 100 mg/dL (ref 0.0–149.0)
VLDL: 20 mg/dL (ref 0.0–40.0)

## 2017-05-14 LAB — HEMOGLOBIN A1C: Hgb A1c MFr Bld: 9.3 % — ABNORMAL HIGH (ref 4.6–6.5)

## 2017-05-14 MED ORDER — ALBUTEROL SULFATE HFA 108 (90 BASE) MCG/ACT IN AERS: INHALATION_SPRAY | RESPIRATORY_TRACT | 5 refills | Status: DC

## 2017-05-14 MED ORDER — FUROSEMIDE 40 MG PO TABS: ORAL_TABLET | ORAL | 3 refills | Status: DC

## 2017-05-14 MED ORDER — METOPROLOL SUCCINATE ER 100 MG PO TB24: ORAL_TABLET | ORAL | 3 refills | Status: DC

## 2017-05-14 MED ORDER — INSULIN GLARGINE 100 UNIT/ML SOLOSTAR PEN: PEN_INJECTOR | SUBCUTANEOUS | 2 refills | Status: DC

## 2017-05-14 MED ORDER — ROSUVASTATIN CALCIUM 20 MG PO TABS: ORAL_TABLET | ORAL | 3 refills | Status: DC

## 2017-05-14 NOTE — Assessment & Plan Note (Signed)
stable overall by history and exam, recent data reviewed with pt, and pt to continue medical treatment as before,  to f/u any worsening symptoms or concerns BP Readings from Last 3 Encounters:  05/14/17 132/86  09/24/16 122/64  02/25/16 124/76

## 2017-05-14 NOTE — Progress Notes (Signed)
Subjective:    Patient ID: Holly Weiss, female    DOB: [birth year], 76 y.o.   MRN: [id number]  HPI  Here to f/u; overall doing ok,  Pt denies chest pain, increasing sob or doe, wheezing, orthopnea, PND, increased LE swelling, palpitations, dizziness or syncope.  Pt denies new neurological symptoms such as new headache, or facial or extremity weakness or numbness.  Pt denies polydipsia, polyuria, or low sugar episode.  Pt states overall good compliance with meds, mostly trying to follow appropriate diet, with wt overall stable,  but little exercise however.  Tylenol makes her sleepy, and alleve seems to increase the general pain from both hips to feet. Did fall OOB again, was getting up but ended up laying on the floor. Had a left forehead bruise now resolved, left eye was "swollen over" but also resolved. Has occasional loose bowels for several months, Denies worsening reflux, abd pain, dysphagia, n/v, other bowel change or blood. Past Medical History:  Diagnosis Date  . ANEMIA-NOS 01/18/2008  . Arthritis   . 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 ASTHMA, WITH ACUTE EXACERBATION 11/29/2008  . Bilateral carpal tunnel syndrome   . Chronic pain syndrome 01/21/2010  . Depression   . DIABETIC  RETINOPATHY 05/30/2007  . DM, UNCOMPLICATED, TYPE II, UNCONTROLLED 05/30/2007  . Edema of both legs    takes Lasix  . Femur fracture, right ([place])   . GERD (gastroesophageal reflux disease)   . 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 History of blood transfusion    spine surgery  . History of kidney stones   . HYPERLIPIDEMIA 05/30/2007  . HYPERTENSION 10/31/2007  . LOW BACK PAIN 01/18/2008  . Migraine headache   . MORBID OBESITY, HX OF 05/30/2007  . NEPHROLITHIASIS, HX OF 01/18/2008  . Numbness and tingling in hands   . Seasonal allergies   . Shortness of breath    with walking  . SHOULDER PAIN, BILATERAL 04/09/2009  . Spinal stenosis   . Urgency of urination    Past Surgical History:  Procedure Laterality Date  .  BACK SURGERY    . CATARACT EXTRACTION    . CHOLECYSTECTOMY    . lumbar disc surgury    . ORIF TIBIA FRACTURE Right 01/16/2014   Procedure: RIGHT TIBIA REPAIR NON-UNION/MALUNION TIBIA WITH SLIDING GRAFT;  Surgeon: Renette Butters, MD;  Location: [facility];  Service: Orthopedics;  Laterality: Right;  . THORACIC FUSION    . TOTAL KNEE ARTHROPLASTY WITH REVISION COMPONENTS Right 07/12/2013   Procedure: TOTAL KNEE ARTHROPLASTY WITH TIBIA REVISION COMPONENTS;  Surgeon: Ninetta Lights, MD;  Location: [facility];  Service: Orthopedics;  Laterality: Right;  . TUBAL LIGATION      reports that she has never smoked. She uses smokeless tobacco. She reports that she does not drink alcohol or use drugs. family history includes Dementia in her mother; Depression in her sister; Diabetes in her mother and sister; Heart disease in her father and mother; Lung cancer in her father. Allergies  Allergen Reactions  . Adhesive [Tape] Itching  . Endocet [Oxycodone-Acetaminophen] Itching  . Latex Itching  . Oxycodone-Acetaminophen Itching   Current Outpatient Prescriptions on File Prior to Visit  Medication Sig Dispense Refill  . aspirin 81 MG tablet Take 1 tablet (81 mg total) by mouth daily. 30 tablet 0  . B-D ULTRAFINE III SHORT PEN 31G X 8 MM MISC USE TO CHECK BLOOD SUGERS TWICE DAILY 100 each 0  . Blood Glucose Monitoring Suppl ([place]) w/Device KIT  Use as directed once daily to check blood sugar.  Diagnosis code 250.02 1 each 0  . Calcium Carbonate-Vitamin D (CALTRATE 600+D PO) Take 2 tablets by mouth daily.    Marland Kitchen donepezil (ARICEPT) 5 MG tablet TAKE 1 TABLET BY MOUTH EVERY NIGHT AT BEDTIME    . glucose blood (ONE TOUCH TEST STRIPS) test strip Use as directed once daily to check blood sugar.  Diagnosis code E11.9 100 each 11  . insulin lispro (HUMALOG KWIKPEN) 100 UNIT/ML KiwkPen 15 units sq each evening at dinner 15 mL 11  . LANTUS SOLOSTAR 100 UNIT/ML Solostar Pen ADMINISTER 45 UNITS UNDER THE SKIN  DAILY 15 mL 2  . Magnesium 250 MG TABS Take 1 tablet (250 mg total) by mouth daily. 90 tablet 0  . Multiple Vitamins-Minerals (ONE-A-DAY EXTRAS ANTIOXIDANT PO) Take 1 tablet by mouth daily.     . ONE TOUCH LANCETS MISC Use as directed once daily to check blood sugar. DX E11.09 100 each 3  . potassium chloride (K-DUR) 10 MEQ tablet TAKE 1 TABLET BY MOUTH DAILY,(*ONLY ON DAY THE LASIX IS TAKEN*) 180 tablet 3  . QUEtiapine (SEROQUEL) 25 MG tablet Take 1 tablet (25 mg total) by mouth 2 (two) times daily. 180 tablet 1  . tiZANidine (ZANAFLEX) 4 MG tablet TAKE 1 TABLET BY MOUTH EVERY 8 HOURS AS NEEDED FOR MUSCLE SPASMS 300 tablet 0  . traMADol (ULTRAM) 50 MG tablet Take 1 tablet (50 mg total) by mouth every 6 (six) hours as needed. 60 tablet 2   No current facility-administered medications on file prior to visit.    Review of Systems  Constitutional: Negative for other unusual diaphoresis or sweats HENT: Negative for ear discharge or swelling Eyes: Negative for other worsening visual disturbances Respiratory: Negative for stridor or other swelling  Gastrointestinal: Negative for worsening distension or other blood Genitourinary: Negative for retention or other urinary change Musculoskeletal: Negative for other MSK pain or swelling Skin: Negative for color change or other new lesions Neurological: Negative for worsening tremors and other numbness  Psychiatric/Behavioral: Negative for worsening agitation or other fatigue All other system neg per pt    Objective:   Physical Exam BP 132/86   Pulse 60   Temp 97.8 F (36.6 C) (Oral)   Ht _0  (1.549 m)   SpO2 99%  VS noted,  Constitutional: Pt appears in NAD HENT: Head: NCAT.  Right Ear: External ear normal.  Left Ear: External ear normal.  Eyes: . Pupils are equal, round, and reactive to light. Conjunctivae and EOM are normal Nose: without d/c or deformity Neck: Neck supple. Gross normal ROM Cardiovascular: Normal rate and regular  rhythm.   Pulmonary/Chest: Effort normal and breath sounds without rales or wheezing.  Neurological: Pt is alert. At baseline orientation, motor grossly intact Skin: Skin is warm. No rashes, other new lesions, no LE edema Psychiatric: Pt behavior is normal without agitation  No other exam findings Lab Results  Component Value Date   WBC 5.5 09/24/2016   HGB 11.5 (L) 09/24/2016   HCT 34.1 (L) 09/24/2016   PLT 181.0 09/24/2016   GLUCOSE 163 (H) 09/24/2016   CHOL 168 09/24/2016   TRIG 136.0 09/24/2016   HDL 48.40 09/24/2016   LDLCALC 92 09/24/2016   ALT 14 09/24/2016   AST 18 09/24/2016   NA 138 09/24/2016   K 4.4 09/24/2016   CL 108 09/24/2016   CREATININE 1.20 09/24/2016   BUN 44 (H) 09/24/2016   CO2 26 09/24/2016  TSH 1.35 09/24/2016   INR 1.11 01/15/2014   HGBA1C 10.7 (H) 09/24/2016   MICROALBUR 2.3 (H) 09/24/2016       Assessment & Plan:

## 2017-05-14 NOTE — Assessment & Plan Note (Signed)
Not well controlled at last visit, o/w stable overall by history and exam, recent data reviewed with pt, and pt to continue medical treatment as before,  But to check labs today, to f/u any worsening symptoms or concerns Lab Results  Component Value Date   HGBA1C 10.7 (H) 09/24/2016

## 2017-05-14 NOTE — Patient Instructions (Addendum)
You had the flu shot today  Please continue all other medications as before, and refills have been done if requested.  Please have the pharmacy call with any other refills you may need.  Please continue your efforts at being more active, low cholesterol diet, and weight control..  Please keep your appointments with your specialists as you may have planned  Please go to the LAB in the Basement (turn left off the elevator) for the tests to be done today  You will be contacted by phone if any changes need to be made immediately.  Otherwise, you will receive a letter about your results with an explanation, but please check with MyChart first.  Please remember to sign up for MyChart if you have not done so, as this will be important to you in the future with finding out test results, communicating by private email, and scheduling acute appointments online when needed.  Please return in 6 months, or sooner if needed, with Lab testing done 3-5 days before   

## 2017-05-14 NOTE — Assessment & Plan Note (Signed)
stable overall by history and exam, recent data reviewed with pt, and pt to continue medical treatment as before,  to f/u any worsening symptoms or concerns, inhaler prn refilled today

## 2017-05-14 NOTE — Assessment & Plan Note (Signed)
stable overall by history and exam, recent data reviewed with pt, and pt to continue medical treatment as before,  to f/u any worsening symptoms or concerns, for f/u lab today 

## 2017-05-17 ENCOUNTER — Telehealth: Payer: Self-pay

## 2017-05-17 NOTE — Telephone Encounter (Signed)
Pt was informed to increase lantus and she expressed understanding.

## 2017-05-17 NOTE — Telephone Encounter (Signed)
-----   Message from Biagio Borg, MD sent at 05/14/2017  4:40 PM EDT ----- Letter sent, cont same tx except  The test results show that your current treatment is OK, except the a1c is too high again.  Please increase the levemir to 55 units from 45 units to help get the sugar down.   Shirron to please inform pt, I will do rx update

## 2017-05-26 DIAGNOSIS — Z48812 Encounter for surgical aftercare following surgery on the circulatory system: Secondary | ICD-10-CM | POA: Diagnosis not present

## 2017-06-28 ENCOUNTER — Other Ambulatory Visit: Payer: Self-pay | Admitting: *Deleted

## 2017-06-28 DIAGNOSIS — E119 Type 2 diabetes mellitus without complications: Secondary | ICD-10-CM

## 2017-06-28 MED ORDER — INSULIN PEN NEEDLE 31G X 8 MM MISC: 3 refills | Status: DC

## 2017-06-28 MED ORDER — ALBUTEROL SULFATE HFA 108 (90 BASE) MCG/ACT IN AERS: INHALATION_SPRAY | RESPIRATORY_TRACT | 3 refills | Status: DC

## 2017-06-28 MED ORDER — INSULIN GLARGINE 100 UNIT/ML SOLOSTAR PEN: PEN_INJECTOR | SUBCUTANEOUS | 3 refills | Status: DC

## 2017-06-28 MED ORDER — ONETOUCH LANCETS MISC: 3 refills | Status: AC

## 2017-06-28 MED ORDER — INSULIN LISPRO 100 UNIT/ML (KWIKPEN): PEN_INJECTOR | SUBCUTANEOUS | 3 refills | Status: DC

## 2017-06-28 NOTE — Telephone Encounter (Signed)
Rec'd call from pt stating she is needing refills sent to [facility] for her Pro air, Lantus solostar, Humalog, and lancets. Reviewed chart pt is up-to-date sent refills to [facility]...[name]

## 2017-09-03 ENCOUNTER — Telehealth: Payer: Self-pay | Admitting: Internal Medicine

## 2017-09-03 DIAGNOSIS — I10 Essential (primary) hypertension: Secondary | ICD-10-CM

## 2017-09-03 NOTE — Telephone Encounter (Signed)
Copied from [place] [id number]. Topic: Quick Communication - See Telephone Encounter >> Sep 03, 2017  4:00 PM Bea Graff, NT wrote: CRM for notification. See Telephone encounter for: Pt calling needing refills of Insulin Glargine (LANTUS SOLOSTAR), Humalog Kwikpen, Crestor 20mg , and Metoprolol. She uses [facility] on [address]. Pt is out of most of these medications.  09/03/17.

## 2017-09-06 MED ORDER — INSULIN GLARGINE 100 UNIT/ML SOLOSTAR PEN: PEN_INJECTOR | SUBCUTANEOUS | 3 refills | Status: DC

## 2017-09-06 MED ORDER — METOPROLOL SUCCINATE ER 100 MG PO TB24
ORAL_TABLET | ORAL | 3 refills | Status: DC
Start: 2017-09-06 — End: 2018-09-05

## 2017-09-06 MED ORDER — ROSUVASTATIN CALCIUM 20 MG PO TABS: ORAL_TABLET | ORAL | 3 refills | Status: DC

## 2017-09-06 MED ORDER — INSULIN LISPRO 100 UNIT/ML (KWIKPEN): PEN_INJECTOR | SUBCUTANEOUS | 3 refills | Status: DC

## 2017-10-20 ENCOUNTER — Telehealth: Payer: Self-pay | Admitting: Internal Medicine

## 2017-10-20 MED ORDER — INSULIN GLARGINE 100 UNIT/ML SOLOSTAR PEN: PEN_INJECTOR | SUBCUTANEOUS | 3 refills | Status: DC

## 2017-10-20 NOTE — Telephone Encounter (Signed)
Copied from [place] [id number]. Topic: Quick Communication - See Telephone Encounter >> Oct 20, 2017 12:31 PM Conception Chancy, NT wrote: CRM for notification. See Telephone encounter for:  10/20/17.  Patient is calling and states she needs a refill on Insulin Glargine (LANTUS SOLOSTAR) 100 UNIT/ML Solostar Pen   please advise.   [id number] - [place], [place] - [address]  [place] [address]  Phone: [phone] Fax: [phone]

## 2017-11-12 ENCOUNTER — Ambulatory Visit: Payer: Self-pay | Admitting: Internal Medicine

## 2017-12-03 ENCOUNTER — Other Ambulatory Visit: Payer: Self-pay | Admitting: Internal Medicine

## 2018-01-06 ENCOUNTER — Encounter: Payer: Self-pay | Admitting: Internal Medicine

## 2018-01-06 ENCOUNTER — Ambulatory Visit (INDEPENDENT_AMBULATORY_CARE_PROVIDER_SITE_OTHER): Payer: Medicare Other | Admitting: Internal Medicine

## 2018-01-06 ENCOUNTER — Other Ambulatory Visit (INDEPENDENT_AMBULATORY_CARE_PROVIDER_SITE_OTHER): Payer: Medicare Other

## 2018-01-06 VITALS — BP 124/82 | HR 71 | Temp 98.3°F | Ht 61.0 in

## 2018-01-06 DIAGNOSIS — I10 Essential (primary) hypertension: Secondary | ICD-10-CM

## 2018-01-06 DIAGNOSIS — E119 Type 2 diabetes mellitus without complications: Secondary | ICD-10-CM

## 2018-01-06 DIAGNOSIS — Z0000 Encounter for general adult medical examination without abnormal findings: Secondary | ICD-10-CM | POA: Diagnosis not present

## 2018-01-06 DIAGNOSIS — E785 Hyperlipidemia, unspecified: Secondary | ICD-10-CM

## 2018-01-06 LAB — CBC WITH DIFFERENTIAL/PLATELET
BASOS PCT: 1.1 % (ref 0.0–3.0)
Basophils Absolute: 0.1 10*3/uL (ref 0.0–0.1)
EOS ABS: 0.3 10*3/uL (ref 0.0–0.7)
EOS PCT: 5.5 % — AB (ref 0.0–5.0)
HCT: 33.3 % — ABNORMAL LOW (ref 36.0–46.0)
HEMOGLOBIN: 11.3 g/dL — AB (ref 12.0–15.0)
LYMPHS PCT: 20.3 % (ref 12.0–46.0)
Lymphs Abs: 1.3 10*3/uL (ref 0.7–4.0)
MCHC: 33.9 g/dL (ref 30.0–36.0)
MCV: 89.8 fl (ref 78.0–100.0)
Monocytes Absolute: 0.6 10*3/uL (ref 0.1–1.0)
Monocytes Relative: 8.8 % (ref 3.0–12.0)
NEUTROS ABS: 4.1 10*3/uL (ref 1.4–7.7)
Neutrophils Relative %: 64.3 % (ref 43.0–77.0)
PLATELETS: 204 10*3/uL (ref 150.0–400.0)
RBC: 3.71 Mil/uL — ABNORMAL LOW (ref 3.87–5.11)
RDW: 15.3 % (ref 11.5–15.5)
WBC: 6.4 10*3/uL (ref 4.0–10.5)

## 2018-01-06 LAB — LIPID PANEL
CHOLESTEROL: 150 mg/dL (ref 0–200)
HDL: 42.8 mg/dL (ref >39.00)
LDL CALC: 89 mg/dL (ref 0–99)
NonHDL: 106.88
TRIGLYCERIDES: 88 mg/dL (ref 0.0–149.0)
Total CHOL/HDL Ratio: 3
VLDL: 17.6 mg/dL (ref 0.0–40.0)

## 2018-01-06 LAB — HEPATIC FUNCTION PANEL
ALT: 15 U/L (ref 0–35)
AST: 20 U/L (ref 0–37)
Albumin: 3.9 g/dL (ref 3.5–5.2)
Alkaline Phosphatase: 99 U/L (ref 39–117)
BILIRUBIN DIRECT: 0.1 mg/dL (ref 0.0–0.3)
TOTAL PROTEIN: 7.3 g/dL (ref 6.0–8.3)
Total Bilirubin: 0.3 mg/dL (ref 0.2–1.2)

## 2018-01-06 LAB — URINALYSIS, ROUTINE W REFLEX MICROSCOPIC
BILIRUBIN URINE: NEGATIVE
Hgb urine dipstick: NEGATIVE
Ketones, ur: NEGATIVE
Leukocytes, UA: NEGATIVE
Nitrite: NEGATIVE
PH: 5.5 (ref 5.0–8.0)
RBC / HPF: NONE SEEN (ref 0–?)
SPECIFIC GRAVITY, URINE: 1.015 (ref 1.000–1.030)
TOTAL PROTEIN, URINE-UPE24: NEGATIVE
UROBILINOGEN UA: 0.2 (ref 0.0–1.0)
Urine Glucose: NEGATIVE
WBC, UA: NONE SEEN (ref 0–?)

## 2018-01-06 LAB — TSH: TSH: 3.01 u[IU]/mL (ref 0.35–4.50)

## 2018-01-06 LAB — MICROALBUMIN / CREATININE URINE RATIO
Creatinine,U: 26.1 mg/dL
Microalb Creat Ratio: 2.7 mg/g (ref 0.0–30.0)
Microalb, Ur: <0.7 mg/dL (ref 0.0–1.9)

## 2018-01-06 LAB — HEMOGLOBIN A1C: Hgb A1c MFr Bld: 8.3 % — ABNORMAL HIGH (ref 4.6–6.5)

## 2018-01-06 LAB — BASIC METABOLIC PANEL
BUN: 49 mg/dL — AB (ref 6–23)
CO2: 27 mEq/L (ref 19–32)
Calcium: 9.8 mg/dL (ref 8.4–10.5)
Chloride: 104 mEq/L (ref 96–112)
Creatinine, Ser: 1.66 mg/dL — ABNORMAL HIGH (ref 0.40–1.20)
GFR: 38.52 mL/min — AB (ref >60.00)
Glucose, Bld: 129 mg/dL — ABNORMAL HIGH (ref 70–99)
POTASSIUM: 4.2 meq/L (ref 3.5–5.1)
SODIUM: 139 meq/L (ref 135–145)

## 2018-01-06 MED ORDER — INSULIN LISPRO 100 UNIT/ML (KWIKPEN): PEN_INJECTOR | SUBCUTANEOUS | 3 refills | Status: DC

## 2018-01-06 MED ORDER — FREESTYLE LIBRE 14 DAY SENSOR MISC: 1.0000 | 3 refills | Status: DC

## 2018-01-06 MED ORDER — FREESTYLE LIBRE 14 DAY READER DEVI: 1.0000 | Freq: Every day | 0 refills | Status: DC

## 2018-01-06 MED ORDER — ALBUTEROL SULFATE HFA 108 (90 BASE) MCG/ACT IN AERS: INHALATION_SPRAY | RESPIRATORY_TRACT | 3 refills | Status: DC

## 2018-01-06 NOTE — Progress Notes (Signed)
Subjective:    Patient ID: Holly Weiss, female    DOB: [birth year], 77 y.o.   MRN: [id number]  HPI  Here for wellness and f/u;  Overall doing ok;  Pt denies Chest pain, worsening SOB, DOE, wheezing, orthopnea, PND, worsening LE edema, palpitations, dizziness or syncope.  Pt denies neurological change such as new headache, facial or extremity weakness.  Pt denies polydipsia, polyuria, or low sugar symptoms. Pt states overall good compliance with treatment and medications, good tolerability, and has been trying to follow appropriate diet.  Pt denies worsening depressive symptoms, suicidal ideation or panic. No fever, night sweats, wt loss, loss of appetite, or other constitutional symptoms.  Pt states good ability with ADL's, has low fall risk, home safety reviewed and adequate, no other significant changes in hearing or vision, and only occasionally active with exercise.  Son stays with her, she is not lonely, and states she has rebuffed efforts by DSS for SNF placement.  Plans to see eye doctor soon on her own.  No new complaints Past Medical History:  Diagnosis Date  . ANEMIA-NOS 01/18/2008  . Arthritis   . 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 ASTHMA, WITH ACUTE EXACERBATION 11/29/2008  . Bilateral carpal tunnel syndrome   . Chronic pain syndrome 01/21/2010  . Depression   . DIABETIC  RETINOPATHY 05/30/2007  . DM, UNCOMPLICATED, TYPE II, UNCONTROLLED 05/30/2007  . Edema of both legs    takes Lasix  . Femur fracture, right ([place])   . GERD (gastroesophageal reflux disease)   . 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 History of blood transfusion    spine surgery  . History of kidney stones   . HYPERLIPIDEMIA 05/30/2007  . HYPERTENSION 10/31/2007  . LOW BACK PAIN 01/18/2008  . Migraine headache   . MORBID OBESITY, HX OF 05/30/2007  . NEPHROLITHIASIS, HX OF 01/18/2008  . Numbness and tingling in hands   . Seasonal allergies   . Shortness of breath    with walking  . SHOULDER PAIN, BILATERAL 04/09/2009  .  Spinal stenosis   . Urgency of urination    Past Surgical History:  Procedure Laterality Date  . BACK SURGERY    . CATARACT EXTRACTION    . CHOLECYSTECTOMY    . lumbar disc surgury    . ORIF TIBIA FRACTURE Right 01/16/2014   Procedure: RIGHT TIBIA REPAIR NON-UNION/MALUNION TIBIA WITH SLIDING GRAFT;  Surgeon: Renette Butters, MD;  Location: [facility];  Service: Orthopedics;  Laterality: Right;  . THORACIC FUSION    . TOTAL KNEE ARTHROPLASTY WITH REVISION COMPONENTS Right 07/12/2013   Procedure: TOTAL KNEE ARTHROPLASTY WITH TIBIA REVISION COMPONENTS;  Surgeon: Ninetta Lights, MD;  Location: [facility];  Service: Orthopedics;  Laterality: Right;  . TUBAL LIGATION      reports that she has never smoked. She uses smokeless tobacco. She reports that she does not drink alcohol or use drugs. family history includes Dementia in her mother; Depression in her sister; Diabetes in her mother and sister; Heart disease in her father and mother; Lung cancer in her father. Allergies  Allergen Reactions  . Adhesive [Tape] Itching  . Endocet [Oxycodone-Acetaminophen] Itching  . Latex Itching  . Oxycodone-Acetaminophen Itching   Current Outpatient Medications on File Prior to Visit  Medication Sig Dispense Refill  . aspirin 81 MG tablet Take 1 tablet (81 mg total) by mouth daily. 30 tablet 0  . B-D ULTRAFINE III SHORT PEN 31G X 8 MM MISC USE TO CHECK BLOOD SUGERS TWICE DAILY  100 each 0  . Blood Glucose Monitoring Suppl ([place]) w/Device KIT Use as directed once daily to check blood sugar.  Diagnosis code 250.02 1 each 0  . Calcium Carbonate-Vitamin D (CALTRATE 600+D PO) Take 2 tablets by mouth daily.    Marland Kitchen donepezil (ARICEPT) 5 MG tablet TAKE 1 TABLET BY MOUTH EVERY NIGHT AT BEDTIME    . furosemide (LASIX) 40 MG tablet TAKE 1 TABLET BY MOUTH TWICE DAILY AS NEEDED FOR PERSISTENT SWELLING 180 tablet 3  . glucose blood (ONE TOUCH TEST STRIPS) test strip Use as directed once daily to check blood  sugar.  Diagnosis code E11.9 100 each 11  . Insulin Glargine (LANTUS SOLOSTAR) 100 UNIT/ML Solostar Pen ADMINISTER 55 UNITS UNDER THE SKIN DAILY 15 mL 3  . Insulin Pen Needle (B-D ULTRAFINE III SHORT PEN) 31G X 8 MM MISC USE TO CHECK BLOOD SUGERS TWICE DAILY 100 each 3  . Magnesium 250 MG TABS Take 1 tablet (250 mg total) by mouth daily. 90 tablet 0  . metoprolol succinate (TOPROL-XL) 100 MG 24 hr tablet TAKE 1 TABLET BY MOUTH DAILY. TAKE WITH SOMETHING OR IMMEDIATELY FOLLOWING A MEAL 90 tablet 3  . Multiple Vitamins-Minerals (ONE-A-DAY EXTRAS ANTIOXIDANT PO) Take 1 tablet by mouth daily.     . ONE TOUCH LANCETS MISC Use as directed once daily to check blood sugar. DX E11.09 100 each 3  . potassium chloride (K-DUR) 10 MEQ tablet TAKE 1 TABLET BY MOUTH DAILY,(*ONLY ON DAY THE LASIX IS TAKEN*) 180 tablet 3  . QUEtiapine (SEROQUEL) 25 MG tablet Take 1 tablet (25 mg total) by mouth 2 (two) times daily. 180 tablet 1  . rosuvastatin (CRESTOR) 20 MG tablet TAKE 1/2 TABLET(10 MG) BY MOUTH DAILY 90 tablet 3  . tiZANidine (ZANAFLEX) 4 MG tablet TAKE 1 TABLET BY MOUTH EVERY 8 HOURS AS NEEDED FOR MUSCLE SPASMS 300 tablet 0  . traMADol (ULTRAM) 50 MG tablet Take 1 tablet (50 mg total) by mouth every 6 (six) hours as needed. 60 tablet 2   No current facility-administered medications on file prior to visit.    Review of Systems Constitutional: Negative for other unusual diaphoresis, sweats, appetite or weight changes HENT: Negative for other worsening hearing loss, ear pain, facial swelling, mouth sores or neck stiffness.   Eyes: Negative for other worsening pain, redness or other visual disturbance.  Respiratory: Negative for other stridor or swelling Cardiovascular: Negative for other palpitations or other chest pain  Gastrointestinal: Negative for worsening diarrhea or loose stools, blood in stool, distention or other pain Genitourinary: Negative for hematuria, flank pain or other change in urine volume.    Musculoskeletal: Negative for myalgias or other joint swelling.  Skin: Negative for other color change, or other wound or worsening drainage.  Neurological: Negative for other syncope or numbness. Hematological: Negative for other adenopathy or swelling Psychiatric/Behavioral: Negative for hallucinations, other worsening agitation, SI, self-injury, or new decreased concentration All other system neg per pt    Objective:   Physical Exam BP 124/82   Pulse 71   Temp 98.3 F (36.8 C) (Oral)   Ht 5' 1"  (1.549 m)   SpO2 95%   BMI 40.43 kg/m  VS noted,  Constitutional: Pt is oriented to person, place, and time. Appears well-developed and well-nourished, in no significant distress and comfortable Head: Normocephalic and atraumatic  Eyes: Conjunctivae and EOM are normal. Pupils are equal, round, and reactive to light Right Ear: External ear normal without discharge Left Ear: External  ear normal without discharge Nose: Nose without discharge or deformity Mouth/Throat: Oropharynx is without other ulcerations and moist  Neck: Normal range of motion. Neck supple. No JVD present. No tracheal deviation present or significant neck LA or mass Cardiovascular: Normal rate, regular rhythm, normal heart sounds and intact distal pulses.   Pulmonary/Chest: WOB normal and breath sounds without rales or wheezing  Abdominal: Soft. Bowel sounds are normal. NT. No HSM  Musculoskeletal: Normal range of motion. Exhibits no edema Lymphadenopathy: Has no other cervical adenopathy.  Neurological: Pt is alert and oriented to person, place, and time. Pt has normal reflexes. No cranial nerve deficit. Motor grossly intact, Gait intact Skin: Skin is warm and dry. No rash noted or new ulcerations Psychiatric:  Has normal mood and affect. Behavior is normal without agitation No other exam findings     Assessment & Plan:

## 2018-01-06 NOTE — Patient Instructions (Signed)
Please continue all other medications as before, and refills have been done if requested. As well as the new Freestyle meter and sensors  Please have the pharmacy call with any other refills you may need.  Please continue your efforts at being more active, low cholesterol diet, and weight control.  You are otherwise up to date with prevention measures today.  Please keep your appointments with your specialists as you may have planned  Please go to the LAB in the Basement (turn left off the elevator) for the tests to be done today  You will be contacted by phone if any changes need to be made immediately.  Otherwise, you will receive a letter about your results with an explanation, but please check with MyChart first.  Please remember to sign up for MyChart if you have not done so, as this will be important to you in the future with finding out test results, communicating by private email, and scheduling acute appointments online when needed.  Please return in 6 months, or sooner if needed

## 2018-01-07 ENCOUNTER — Encounter: Payer: Self-pay | Admitting: Internal Medicine

## 2018-01-07 ENCOUNTER — Other Ambulatory Visit: Payer: Self-pay | Admitting: Internal Medicine

## 2018-01-07 DIAGNOSIS — N179 Acute kidney failure, unspecified: Secondary | ICD-10-CM

## 2018-01-09 ENCOUNTER — Encounter: Payer: Self-pay | Admitting: Internal Medicine

## 2018-01-09 NOTE — Assessment & Plan Note (Signed)
Lab Results  Component Value Date   LDLCALC 89 01/06/2018  stable overall by history and exam, recent data reviewed with pt, and pt to continue medical treatment as before,  to f/u any worsening symptoms or concerns

## 2018-01-09 NOTE — Assessment & Plan Note (Signed)

## 2018-01-09 NOTE — Assessment & Plan Note (Signed)
stable overall by history and exam, recent data reviewed with pt, and pt to continue medical treatment as before,  to f/u any worsening symptoms or concerns BP Readings from Last 3 Encounters:  01/06/18 124/82  05/14/17 132/86  09/24/16 122/64

## 2018-01-09 NOTE — Assessment & Plan Note (Signed)
Lab Results  Component Value Date   HGBA1C 8.3 (H) 01/06/2018   stable overall by history and exam, recent data reviewed with pt, and pt to continue medical treatment as before,  to f/u any worsening symptoms or concerns

## 2018-02-09 ENCOUNTER — Other Ambulatory Visit (INDEPENDENT_AMBULATORY_CARE_PROVIDER_SITE_OTHER): Payer: Medicare Other

## 2018-02-09 ENCOUNTER — Encounter: Payer: Self-pay | Admitting: Internal Medicine

## 2018-02-09 ENCOUNTER — Ambulatory Visit (INDEPENDENT_AMBULATORY_CARE_PROVIDER_SITE_OTHER): Payer: Medicare Other | Admitting: Internal Medicine

## 2018-02-09 VITALS — BP 124/86 | HR 69 | Temp 98.1°F | Ht 61.0 in

## 2018-02-09 DIAGNOSIS — J309 Allergic rhinitis, unspecified: Secondary | ICD-10-CM

## 2018-02-09 DIAGNOSIS — N179 Acute kidney failure, unspecified: Secondary | ICD-10-CM | POA: Diagnosis not present

## 2018-02-09 DIAGNOSIS — I5032 Chronic diastolic (congestive) heart failure: Secondary | ICD-10-CM | POA: Diagnosis not present

## 2018-02-09 DIAGNOSIS — I10 Essential (primary) hypertension: Secondary | ICD-10-CM | POA: Diagnosis not present

## 2018-02-09 MED ORDER — TRIAMCINOLONE ACETONIDE 55 MCG/ACT NA AERO: 2.0000 | INHALATION_SPRAY | Freq: Every day | NASAL | 12 refills | Status: DC

## 2018-02-09 MED ORDER — TIZANIDINE HCL 4 MG PO TABS: ORAL_TABLET | ORAL | 0 refills | Status: DC

## 2018-02-09 MED ORDER — POTASSIUM CHLORIDE ER 10 MEQ PO TBCR: EXTENDED_RELEASE_TABLET | ORAL | 3 refills | Status: DC

## 2018-02-09 NOTE — Patient Instructions (Addendum)
Please take all new medication as prescribed - the nasacort for allergies  Please continue all other medications as before, and refills have been done if requested - the potassium and muscle relaxer as needed  Please have the pharmacy call with any other refills you may need.  Please continue your efforts at being more active, low cholesterol diet, and weight control.  Please keep your appointments with your specialists as you may have planned  Please go to the LAB in the Basement (turn left off the elevator) for the tests to be done today  You will be contacted by phone if any changes need to be made immediately.  Otherwise, you will receive a letter about your results with an explanation, but please check with MyChart first.  Please remember to sign up for MyChart if you have not done so, as this will be important to you in the future with finding out test results, communicating by private email, and scheduling acute appointments online when needed.

## 2018-02-09 NOTE — Assessment & Plan Note (Signed)
stable overall by history and exam, recent data reviewed with pt, and pt to continue medical treatment as before,  to f/u any worsening symptoms or concerns BP Readings from Last 3 Encounters:  02/09/18 124/86  01/06/18 124/82  05/14/17 132/86

## 2018-02-09 NOTE — Assessment & Plan Note (Signed)
Ok for nasacort asd,  to f/u any worsening symptoms or concerns 

## 2018-02-09 NOTE — Assessment & Plan Note (Signed)
Mild worsening recently, clinically improved and volume stable now on lasix 40 qd only; for f/u lab today

## 2018-02-09 NOTE — Progress Notes (Signed)
Subjective:    Patient ID: Holly Weiss, female    DOB: [birth year], 77 y.o.   MRN: [id number]  HPI  Here to f/u recent AKI on lasix 40 bid last visit; Had held the lasix x 3 days, then restarted at once dialy only.   Feels better overall with less weakness, more stamina.  Pt denies chest pain, increased sob or doe, wheezing, orthopnea, PND, increased LE swelling, palpitations, dizziness or syncope.  Pt denies new neurological symptoms such as new headache, or facial or extremity weakness or numbness   Pt denies polydipsia, polyuria Does have several wks ongoing nasal allergy symptoms with clearish congestion, itch and sneezing, without fever, pain, ST, cough, swelling or wheezing. Past Medical History:  Diagnosis Date  . ANEMIA-NOS 01/18/2008  . Arthritis   . 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 ASTHMA, WITH ACUTE EXACERBATION 11/29/2008  . Bilateral carpal tunnel syndrome   . Chronic pain syndrome 01/21/2010  . Depression   . DIABETIC  RETINOPATHY 05/30/2007  . DM, UNCOMPLICATED, TYPE II, UNCONTROLLED 05/30/2007  . Edema of both legs    takes Lasix  . Femur fracture, right ([place])   . GERD (gastroesophageal reflux disease)   . 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 History of blood transfusion    spine surgery  . History of kidney stones   . HYPERLIPIDEMIA 05/30/2007  . HYPERTENSION 10/31/2007  . LOW BACK PAIN 01/18/2008  . Migraine headache   . MORBID OBESITY, HX OF 05/30/2007  . NEPHROLITHIASIS, HX OF 01/18/2008  . Numbness and tingling in hands   . Seasonal allergies   . Shortness of breath    with walking  . SHOULDER PAIN, BILATERAL 04/09/2009  . Spinal stenosis   . Urgency of urination    Past Surgical History:  Procedure Laterality Date  . BACK SURGERY    . CATARACT EXTRACTION    . CHOLECYSTECTOMY    . lumbar disc surgury    . ORIF TIBIA FRACTURE Right 01/16/2014   Procedure: RIGHT TIBIA REPAIR NON-UNION/MALUNION TIBIA WITH SLIDING GRAFT;  Surgeon: Renette Butters, MD;  Location: [facility];  Service: Orthopedics;  Laterality: Right;  . THORACIC FUSION    . TOTAL KNEE ARTHROPLASTY WITH REVISION COMPONENTS Right 07/12/2013   Procedure: TOTAL KNEE ARTHROPLASTY WITH TIBIA REVISION COMPONENTS;  Surgeon: Ninetta Lights, MD;  Location: [facility] [place];  Service: Orthopedics;  Laterality: Right;  . TUBAL LIGATION      reports that she has never smoked. She uses smokeless tobacco. She reports that she does not drink alcohol or use drugs. family history includes Dementia in her mother; Depression in her sister; Diabetes in her mother and sister; Heart disease in her father and mother; Lung cancer in her father. Allergies  Allergen Reactions  . Adhesive [Tape] Itching  . Endocet [Oxycodone-Acetaminophen] Itching  . Latex Itching  . Oxycodone-Acetaminophen Itching   Current Outpatient Medications on File Prior to Visit  Medication Sig Dispense Refill  . albuterol (VENTOLIN HFA) 108 (90 Base) MCG/ACT inhaler INHALE 2 PUFFS INTO THE LUNGS TWICE DAILY AS NEEDED FOR WHEEZING OR SHORTNESS OF BREATH 18 g 3  . aspirin 81 MG tablet Take 1 tablet (81 mg total) by mouth daily. 30 tablet 0  . B-D ULTRAFINE III SHORT PEN 31G X 8 MM MISC USE TO CHECK BLOOD SUGERS TWICE DAILY 100 each 0  . Blood Glucose Monitoring Suppl ([place]) w/Device KIT Use as directed once daily to check blood sugar.  Diagnosis code 250.02  1 each 0  . Calcium Carbonate-Vitamin D (CALTRATE 600+D PO) Take 2 tablets by mouth daily.    . Continuous Blood Gluc Receiver (FREESTYLE LIBRE 14 DAY READER) DEVI Apply 1 Device topically daily. Use as directed daily E11.9 1 Device 0  . Continuous Blood Gluc Sensor (FREESTYLE LIBRE 14 DAY SENSOR) MISC Apply 1 Device topically every 14 (fourteen) days. E11.9 6 each 3  . donepezil (ARICEPT) 5 MG tablet TAKE 1 TABLET BY MOUTH EVERY NIGHT AT BEDTIME    . furosemide (LASIX) 40 MG tablet TAKE 1 TABLET BY MOUTH TWICE DAILY AS NEEDED FOR PERSISTENT SWELLING 180 tablet 3  . glucose blood  (ONE TOUCH TEST STRIPS) test strip Use as directed once daily to check blood sugar.  Diagnosis code E11.9 100 each 11  . Insulin Glargine (LANTUS SOLOSTAR) 100 UNIT/ML Solostar Pen ADMINISTER 55 UNITS UNDER THE SKIN DAILY 15 mL 3  . insulin lispro (HUMALOG KWIKPEN) 100 UNIT/ML KiwkPen 15 units sq each evening at dinner 15 mL 3  . Insulin Pen Needle (B-D ULTRAFINE III SHORT PEN) 31G X 8 MM MISC USE TO CHECK BLOOD SUGERS TWICE DAILY 100 each 3  . Magnesium 250 MG TABS Take 1 tablet (250 mg total) by mouth daily. 90 tablet 0  . metoprolol succinate (TOPROL-XL) 100 MG 24 hr tablet TAKE 1 TABLET BY MOUTH DAILY. TAKE WITH SOMETHING OR IMMEDIATELY FOLLOWING A MEAL 90 tablet 3  . Multiple Vitamins-Minerals (ONE-A-DAY EXTRAS ANTIOXIDANT PO) Take 1 tablet by mouth daily.     . ONE TOUCH LANCETS MISC Use as directed once daily to check blood sugar. DX E11.09 100 each 3  . QUEtiapine (SEROQUEL) 25 MG tablet Take 1 tablet (25 mg total) by mouth 2 (two) times daily. 180 tablet 1  . rosuvastatin (CRESTOR) 20 MG tablet TAKE 1/2 TABLET(10 MG) BY MOUTH DAILY 90 tablet 3   No current facility-administered medications on file prior to visit.    Review of Systems  Constitutional: Negative for other unusual diaphoresis or sweats HENT: Negative for ear discharge or swelling Eyes: Negative for other worsening visual disturbances Respiratory: Negative for stridor or other swelling  Gastrointestinal: Negative for worsening distension or other blood Genitourinary: Negative for retention or other urinary change Musculoskeletal: Negative for other MSK pain or swelling Skin: Negative for color change or other new lesions Neurological: Negative for worsening tremors and other numbness  Psychiatric/Behavioral: Negative for worsening agitation or other fatigue All other system neg per pt    Objective:   Physical Exam BP 124/86   Pulse 69   Temp 98.1 F (36.7 C) (Oral)   Ht _0  (1.549 m)   SpO2 96%   BMI 40.43  kg/m  VS noted,  Constitutional: Pt appears in NAD HENT: Head: NCAT.  Right Ear: External ear normal.  Left Ear: External ear normal.  Eyes: . Pupils are equal, round, and reactive to light. Conjunctivae and EOM are normal Nose: without d/c or deformity Bilat tm's with mild erythema.  Max sinus areas non tender.  Pharynx with mild erythema, no exudate Neck: Neck supple. Gross normal ROM Cardiovascular: Normal rate and regular rhythm.   Pulmonary/Chest: Effort normal and breath sounds without rales or wheezing.  Abd:  Soft, NT, ND, + BS, no organomegaly Neurological: Pt is alert. At baseline orientation, motor grossly intact Skin: Skin is warm. No rashes, other new lesions, no LE edema Psychiatric: Pt behavior is normal without agitation  No other exam findings    Assessment & Plan:

## 2018-02-09 NOTE — Assessment & Plan Note (Signed)
stable overall by history and exam, recent data reviewed with pt, and pt to continue medical treatment as before,  to f/u any worsening symptoms or concerns  

## 2018-02-10 ENCOUNTER — Encounter: Payer: Self-pay | Admitting: Internal Medicine

## 2018-02-10 LAB — BASIC METABOLIC PANEL
BUN: 46 mg/dL — ABNORMAL HIGH (ref 6–23)
CALCIUM: 9.7 mg/dL (ref 8.4–10.5)
CO2: 26 mEq/L (ref 19–32)
Chloride: 109 mEq/L (ref 96–112)
Creatinine, Ser: 1.32 mg/dL — ABNORMAL HIGH (ref 0.40–1.20)
GFR: 50.17 mL/min — AB (ref >60.00)
Glucose, Bld: 256 mg/dL — ABNORMAL HIGH (ref 70–99)
POTASSIUM: 5.1 meq/L (ref 3.5–5.1)
SODIUM: 141 meq/L (ref 135–145)

## 2018-02-11 ENCOUNTER — Telehealth: Payer: Self-pay | Admitting: *Deleted

## 2018-02-11 NOTE — Telephone Encounter (Signed)
I called pt to advise of below. No answer/unable to leave vm.

## 2018-02-11 NOTE — Telephone Encounter (Signed)
Copied from [place] [phone]. Topic: General - Other >> Feb 11, 2018 10:02 AM Holly Weiss wrote: Reason for CRM: patient would like for Dr Jenny Reichmann to write  a letter to [facility] disability for not being able to walk and rt hand pain due to the veins are hurting her and its the hand that she uses [facility] gave her a medicine that made her fingers ball up into a fist since she had surgery she states that she has been trying to straighten them but want straighten out  she forgot to talk to Dr Ronnald Ramp yesterday because he didn't have time to talk because he had other patients

## 2018-02-11 NOTE — Telephone Encounter (Signed)
Very sorry, letter to [facility] for soc security are not helpful as they are not accepted  If she feels she is disable, she should apply through [place] office in [place] and they will access all records in looking at her application

## 2018-02-18 NOTE — Telephone Encounter (Signed)
Called pt again- no answer. Unable to leave vm. Closing phone note.

## 2018-02-28 ENCOUNTER — Telehealth: Payer: Self-pay

## 2018-02-28 NOTE — Telephone Encounter (Signed)
Pt has been informed and expressed understanding. Please view previous msg regarding SSI letter.   Copied from [place] [phone]. Topic: Quick Communication - See Telephone Encounter >> Feb 25, 2018  5:20 PM Neva Seat wrote: Pt is asking for Dr. Gwynn Burly nurse to give her a call back.

## 2018-03-07 ENCOUNTER — Other Ambulatory Visit: Payer: Self-pay | Admitting: Internal Medicine

## 2018-03-07 MED ORDER — FUROSEMIDE 40 MG PO TABS: ORAL_TABLET | ORAL | 0 refills | Status: DC

## 2018-03-07 NOTE — Telephone Encounter (Signed)
Returned call to pt but no answer at this time. Unable to leave message due to voicemail not being set up. Medication filled at requested pharmacy.

## 2018-03-07 NOTE — Telephone Encounter (Signed)
Copied from [place] [id number]. Topic: Quick Communication - Rx Refill/Question >> Mar 07, 2018 12:34 PM Oliver Pila B wrote: Medication: furosemide (LASIX) 40 MG tablet [[id number]]   Has the patient contacted their pharmacy? Yes.   (Agent: If no, request that the patient contact the pharmacy for the refill.) (Agent: If yes, when and what did the pharmacy advise?)  Preferred Pharmacy (with phone number or street name): [facility]  Agent: Please be advised that RX refills may take up to 3 business days. We ask that you follow-up with your pharmacy.

## 2018-04-05 ENCOUNTER — Telehealth: Payer: Self-pay

## 2018-04-05 MED ORDER — ALBUTEROL SULFATE HFA 108 (90 BASE) MCG/ACT IN AERS: INHALATION_SPRAY | RESPIRATORY_TRACT | 3 refills | Status: AC

## 2018-04-05 NOTE — Telephone Encounter (Signed)
Pt needed albuterol refill.  Copied from [place]. Topic: Inquiry >> Apr 05, 2018  9:49 AM Conception Chancy, NT wrote: Reason for CRM: patient is requesting a call back from Dr. Jenny Reichmann nurse. Patient would not discuss anything with me. Wants to speak with her only.

## 2018-04-16 DIAGNOSIS — R001 Bradycardia, unspecified: Secondary | ICD-10-CM | POA: Diagnosis not present

## 2018-04-16 DIAGNOSIS — R4020 Unspecified coma: Secondary | ICD-10-CM | POA: Diagnosis not present

## 2018-04-16 DIAGNOSIS — E162 Hypoglycemia, unspecified: Secondary | ICD-10-CM | POA: Diagnosis not present

## 2018-04-16 DIAGNOSIS — R404 Transient alteration of awareness: Secondary | ICD-10-CM | POA: Diagnosis not present

## 2018-04-16 DIAGNOSIS — E161 Other hypoglycemia: Secondary | ICD-10-CM | POA: Diagnosis not present

## 2018-06-05 ENCOUNTER — Other Ambulatory Visit: Payer: Self-pay | Admitting: Internal Medicine

## 2018-06-09 ENCOUNTER — Telehealth: Payer: Self-pay | Admitting: Internal Medicine

## 2018-06-09 ENCOUNTER — Other Ambulatory Visit: Payer: Self-pay | Admitting: Internal Medicine

## 2018-06-09 NOTE — Telephone Encounter (Signed)
Patient spoke with [facility] at 1:21pm on 11/7, stating she was having shortness of breath.  States she has fallen at home a few days ago.  Patient declined triage.  States she needs alupent refill called in for breathing.  [facility] stated to [facility] that lasix was called in and has not been on alupent.  States patient has a proair inhaler refill.  Patient was advised that if she has fluid on board that the lasix will assist with her breathing.  Patient was advised to contact her pharmacy if she would like the refill on proair.  Patient stated she only takes lasix.

## 2018-06-09 NOTE — Telephone Encounter (Signed)
Spoke with patient, She stated that her daughter forgot to pick up her inhaler when she went to the pharmacy for her other medications. She stated that her daughter would be going back to get the inhaler because she has been out and that was the cause of the breathing complication along with the season change.

## 2018-07-07 ENCOUNTER — Ambulatory Visit: Payer: Medicare Other | Admitting: Internal Medicine

## 2018-07-07 ENCOUNTER — Ambulatory Visit: Payer: Self-pay
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
## 2018-07-12 ENCOUNTER — Ambulatory Visit: Payer: Self-pay | Admitting: Internal Medicine

## 2018-07-12 DIAGNOSIS — Z0289 Encounter for other administrative examinations: Secondary | ICD-10-CM

## 2018-07-21 ENCOUNTER — Ambulatory Visit (INDEPENDENT_AMBULATORY_CARE_PROVIDER_SITE_OTHER): Payer: Medicare Other | Admitting: Internal Medicine

## 2018-07-21 ENCOUNTER — Encounter: Payer: Self-pay | Admitting: Internal Medicine

## 2018-07-21 ENCOUNTER — Ambulatory Visit (INDEPENDENT_AMBULATORY_CARE_PROVIDER_SITE_OTHER)
Admission: RE | Admit: 2018-07-21 | Discharge: 2018-07-21 | Disposition: A | Discharge status: Home / Self Care | Payer: Medicare Other | Source: Ambulatory Visit | Attending: Internal Medicine | Admitting: Internal Medicine

## 2018-07-21 ENCOUNTER — Other Ambulatory Visit (INDEPENDENT_AMBULATORY_CARE_PROVIDER_SITE_OTHER): Payer: Medicare Other

## 2018-07-21 VITALS — BP 132/86 | HR 67 | Temp 97.7°F | Ht 61.0 in

## 2018-07-21 DIAGNOSIS — M5136 Other intervertebral disc degeneration, lumbar region: Secondary | ICD-10-CM | POA: Diagnosis not present

## 2018-07-21 DIAGNOSIS — M545 Low back pain, unspecified: Secondary | ICD-10-CM

## 2018-07-21 DIAGNOSIS — Z23 Encounter for immunization: Secondary | ICD-10-CM

## 2018-07-21 DIAGNOSIS — N183 Chronic kidney disease, stage 3 unspecified: Secondary | ICD-10-CM

## 2018-07-21 DIAGNOSIS — E119 Type 2 diabetes mellitus without complications: Secondary | ICD-10-CM

## 2018-07-21 DIAGNOSIS — I10 Essential (primary) hypertension: Secondary | ICD-10-CM

## 2018-07-21 DIAGNOSIS — M533 Sacrococcygeal disorders, not elsewhere classified: Secondary | ICD-10-CM | POA: Diagnosis not present

## 2018-07-21 DIAGNOSIS — M47816 Spondylosis without myelopathy or radiculopathy, lumbar region: Secondary | ICD-10-CM | POA: Diagnosis not present

## 2018-07-21 LAB — CBC WITH DIFFERENTIAL/PLATELET
BASOS PCT: 0.5 % (ref 0.0–3.0)
Basophils Absolute: 0 10*3/uL (ref 0.0–0.1)
EOS PCT: 2.9 % (ref 0.0–5.0)
Eosinophils Absolute: 0.2 10*3/uL (ref 0.0–0.7)
HCT: 33.8 % — ABNORMAL LOW (ref 36.0–46.0)
HEMOGLOBIN: 11.3 g/dL — AB (ref 12.0–15.0)
Lymphocytes Relative: 20.5 % (ref 12.0–46.0)
Lymphs Abs: 1.3 10*3/uL (ref 0.7–4.0)
MCHC: 33.5 g/dL (ref 30.0–36.0)
MCV: 89.6 fl (ref 78.0–100.0)
Monocytes Absolute: 0.6 10*3/uL (ref 0.1–1.0)
Monocytes Relative: 9.2 % (ref 3.0–12.0)
Neutro Abs: 4.2 10*3/uL (ref 1.4–7.7)
Neutrophils Relative %: 66.9 % (ref 43.0–77.0)
Platelets: 165 10*3/uL (ref 150.0–400.0)
RBC: 3.78 Mil/uL — ABNORMAL LOW (ref 3.87–5.11)
RDW: 14.9 % (ref 11.5–15.5)
WBC: 6.4 10*3/uL (ref 4.0–10.5)

## 2018-07-21 LAB — HEPATIC FUNCTION PANEL
ALT: 16 U/L (ref 0–35)
AST: 18 U/L (ref 0–37)
Albumin: 4.1 g/dL (ref 3.5–5.2)
Alkaline Phosphatase: 102 U/L (ref 39–117)
Bilirubin, Direct: 0.1 mg/dL (ref 0.0–0.3)
Total Bilirubin: 0.3 mg/dL (ref 0.2–1.2)
Total Protein: 7.8 g/dL (ref 6.0–8.3)

## 2018-07-21 LAB — BASIC METABOLIC PANEL
BUN: 51 mg/dL — AB (ref 6–23)
CHLORIDE: 104 meq/L (ref 96–112)
CO2: 25 mEq/L (ref 19–32)
Calcium: 10.1 mg/dL (ref 8.4–10.5)
Creatinine, Ser: 1.4 mg/dL — ABNORMAL HIGH (ref 0.40–1.20)
GFR: 46.82 mL/min — ABNORMAL LOW (ref >60.00)
Glucose, Bld: 266 mg/dL — ABNORMAL HIGH (ref 70–99)
Potassium: 4.8 mEq/L (ref 3.5–5.1)
Sodium: 138 mEq/L (ref 135–145)

## 2018-07-21 LAB — TSH: TSH: 1.86 u[IU]/mL (ref 0.35–4.50)

## 2018-07-21 LAB — LIPID PANEL
CHOL/HDL RATIO: 3
Cholesterol: 146 mg/dL (ref 0–200)
HDL: 49.5 mg/dL (ref >39.00)
LDL CALC: 82 mg/dL (ref 0–99)
NonHDL: 96.03
Triglycerides: 70 mg/dL (ref 0.0–149.0)
VLDL: 14 mg/dL (ref 0.0–40.0)

## 2018-07-21 LAB — HEMOGLOBIN A1C: Hgb A1c MFr Bld: 9.4 % — ABNORMAL HIGH (ref 4.6–6.5)

## 2018-07-21 LAB — URINALYSIS, ROUTINE W REFLEX MICROSCOPIC
Bilirubin Urine: NEGATIVE
Hgb urine dipstick: NEGATIVE
Ketones, ur: NEGATIVE
Nitrite: NEGATIVE
SPECIFIC GRAVITY, URINE: 1.02 (ref 1.000–1.030)
Total Protein, Urine: NEGATIVE
Urine Glucose: NEGATIVE
Urobilinogen, UA: 0.2 (ref 0.0–1.0)
pH: 5.5 (ref 5.0–8.0)

## 2018-07-21 MED ORDER — TRAMADOL HCL 50 MG PO TABS: 50.0000 mg | ORAL_TABLET | Freq: Four times a day (QID) | ORAL | 0 refills | Status: DC | PRN

## 2018-07-21 NOTE — Patient Instructions (Addendum)
You had the flu shot today  You are given the mailbox letter, and the handicapped parking form signed  Please take all new medication as prescribed - the tramadol, and please call for refill if need further after 1 week  Please continue all other medications as before, and refills have been done if requested.  Please have the pharmacy call with any other refills you may need.  Please keep your appointments with your specialists as you may have planned  Please go to the XRAY Department in the Basement (go straight as you get off the elevator) for the x-ray testing  Please go to the LAB in the Basement (turn left off the elevator) for the tests to be done today  You will be contacted by phone if any changes need to be made immediately.  Otherwise, you will receive a letter about your results with an explanation, but please check with MyChart first.  Please remember to sign up for MyChart if you have not done so, as this will be important to you in the future with finding out test results, communicating by private email, and scheduling acute appointments online when needed.  Please return in 3 months, or sooner if needed

## 2018-07-21 NOTE — Assessment & Plan Note (Signed)
stable overall by history and exam, recent data reviewed with pt, and pt to continue medical treatment as before,  to f/u any worsening symptoms or concerns  

## 2018-07-21 NOTE — Progress Notes (Signed)
Subjective:    Patient ID: Holly Weiss, female    DOB: [birth year], 77 y.o.   MRN: [id number]  HPI  Here with daughter, c/o ongoing chronic bilat knee pain x 1 wks, worse to stand, severe, intermittent, but no giveaways or falls, except for had 1episode of not eating and fall to backside with sugar 24, EMS called for daughter, pt had not eaten with her meds, sugar better with tx, declined ED evaluation.  Unfortunately still has coccyx and LBP, at least moderate but no bowel or bladder change, fever, wt loss,  worsening LE pain/numbness/weakness, gait change or falls.  Due for f/u CKD labs.  Asks for letter to the post office to have mail deposited at front door instead of street, and handicapped parking application Past Medical History:  Diagnosis Date  . ANEMIA-NOS 01/18/2008  . Arthritis   . 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 ASTHMA, WITH ACUTE EXACERBATION 11/29/2008  . Bilateral carpal tunnel syndrome   . Chronic pain syndrome 01/21/2010  . Depression   . DIABETIC  RETINOPATHY 05/30/2007  . DM, UNCOMPLICATED, TYPE II, UNCONTROLLED 05/30/2007  . Edema of both legs    takes Lasix  . Femur fracture, right ([place])   . GERD (gastroesophageal reflux disease)   . 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 History of blood transfusion    spine surgery  . History of kidney stones   . HYPERLIPIDEMIA 05/30/2007  . HYPERTENSION 10/31/2007  . LOW BACK PAIN 01/18/2008  . Migraine headache   . MORBID OBESITY, HX OF 05/30/2007  . NEPHROLITHIASIS, HX OF 01/18/2008  . Numbness and tingling in hands   . Seasonal allergies   . Shortness of breath    with walking  . SHOULDER PAIN, BILATERAL 04/09/2009  . Spinal stenosis   . Urgency of urination    Past Surgical History:  Procedure Laterality Date  . BACK SURGERY    . CATARACT EXTRACTION    . CHOLECYSTECTOMY    . lumbar disc surgury    . ORIF TIBIA FRACTURE Right 01/16/2014   Procedure: RIGHT TIBIA REPAIR NON-UNION/MALUNION TIBIA WITH SLIDING GRAFT;  Surgeon:  Renette Butters, MD;  Location: [facility];  Service: Orthopedics;  Laterality: Right;  . THORACIC FUSION    . TOTAL KNEE ARTHROPLASTY WITH REVISION COMPONENTS Right 07/12/2013   Procedure: TOTAL KNEE ARTHROPLASTY WITH TIBIA REVISION COMPONENTS;  Surgeon: Ninetta Lights, MD;  Location: [facility];  Service: Orthopedics;  Laterality: Right;  . TUBAL LIGATION      reports that she has never smoked. She uses smokeless tobacco. She reports that she does not drink alcohol or use drugs. family history includes Dementia in her mother; Depression in her sister; Diabetes in her mother and sister; Heart disease in her father and mother; Lung cancer in her father. Allergies  Allergen Reactions  . Adhesive [Tape] Itching  . Endocet [Oxycodone-Acetaminophen] Itching  . Latex Itching  . Oxycodone-Acetaminophen Itching   Current Outpatient Medications on File Prior to Visit  Medication Sig Dispense Refill  . albuterol (VENTOLIN HFA) 108 (90 Base) MCG/ACT inhaler INHALE 2 PUFFS INTO THE LUNGS TWICE DAILY AS NEEDED FOR WHEEZING OR SHORTNESS OF BREATH 18 g 3  . aspirin 81 MG tablet Take 1 tablet (81 mg total) by mouth daily. 30 tablet 0  . B-D ULTRAFINE III SHORT PEN 31G X 8 MM MISC USE TO CHECK BLOOD SUGERS TWICE DAILY 100 each 0  . Blood Glucose Monitoring Suppl ([place]) w/Device KIT Use as  directed once daily to check blood sugar.  Diagnosis code 250.02 1 each 0  . Calcium Carbonate-Vitamin D (CALTRATE 600+D PO) Take 2 tablets by mouth daily.    . Continuous Blood Gluc Receiver (FREESTYLE LIBRE 14 DAY READER) DEVI Apply 1 Device topically daily. Use as directed daily E11.9 1 Device 0  . Continuous Blood Gluc Sensor (FREESTYLE LIBRE 14 DAY SENSOR) MISC Apply 1 Device topically every 14 (fourteen) days. E11.9 6 each 3  . donepezil (ARICEPT) 5 MG tablet TAKE 1 TABLET BY MOUTH EVERY NIGHT AT BEDTIME    . furosemide (LASIX) 40 MG tablet TAKE 1 TABLET BY MOUTH TWICE DAILY AS NEEDED FOR PERSISTENT  SWELLING 180 tablet 0  . glucose blood (ONE TOUCH TEST STRIPS) test strip Use as directed once daily to check blood sugar.  Diagnosis code E11.9 100 each 11  . Insulin Glargine (LANTUS SOLOSTAR) 100 UNIT/ML Solostar Pen ADMINISTER 55 UNITS UNDER THE SKIN DAILY 15 mL 3  . Insulin Glargine (LANTUS SOLOSTAR) 100 UNIT/ML Solostar Pen ADMINISTER 55 UNITS UNDER THE SKIN DAILY 15 mL 2  . insulin lispro (HUMALOG KWIKPEN) 100 UNIT/ML KiwkPen 15 units sq each evening at dinner 15 mL 3  . Insulin Pen Needle (B-D ULTRAFINE III SHORT PEN) 31G X 8 MM MISC USE TO CHECK BLOOD SUGERS TWICE DAILY 100 each 3  . Magnesium 250 MG TABS Take 1 tablet (250 mg total) by mouth daily. 90 tablet 0  . metoprolol succinate (TOPROL-XL) 100 MG 24 hr tablet TAKE 1 TABLET BY MOUTH DAILY. TAKE WITH SOMETHING OR IMMEDIATELY FOLLOWING A MEAL 90 tablet 3  . Multiple Vitamins-Minerals (ONE-A-DAY EXTRAS ANTIOXIDANT PO) Take 1 tablet by mouth daily.     . ONE TOUCH LANCETS MISC Use as directed once daily to check blood sugar. DX E11.09 100 each 3  . potassium chloride (K-DUR) 10 MEQ tablet TAKE 1 TABLET BY MOUTH DAILY,(*ONLY ON DAY THE LASIX IS TAKEN*) 180 tablet 3  . QUEtiapine (SEROQUEL) 25 MG tablet Take 1 tablet (25 mg total) by mouth 2 (two) times daily. 180 tablet 1  . rosuvastatin (CRESTOR) 20 MG tablet TAKE 1/2 TABLET(10 MG) BY MOUTH DAILY 90 tablet 3  . tiZANidine (ZANAFLEX) 4 MG tablet TAKE 1 TABLET BY MOUTH EVERY 8 HOURS AS NEEDED FOR MUSCLE SPASMS 300 tablet 0  . triamcinolone (NASACORT) 55 MCG/ACT AERO nasal inhaler Place 2 sprays into the nose daily. 1 Inhaler 12   No current facility-administered medications on file prior to visit.    Review of Systems  Constitutional: Negative for other unusual diaphoresis or sweats HENT: Negative for ear discharge or swelling Eyes: Negative for other worsening visual disturbances Respiratory: Negative for stridor or other swelling  Gastrointestinal: Negative for worsening distension  or other blood Genitourinary: Negative for retention or other urinary change Musculoskeletal: Negative for other MSK pain or swelling Skin: Negative for color change or other new lesions Neurological: Negative for worsening tremors and other numbness  Psychiatric/Behavioral: Negative for worsening agitation or other fatigue All other system neg per pt    Objective:   Physical Exam BP 132/86   Pulse 67   Temp 97.7 F (36.5 C) (Oral)   Ht 5' 1"  (1.549 m)   SpO2 96%   BMI 40.43 kg/m  VS noted,  Constitutional: Pt appears in NAD HENT: Head: NCAT.  Right Ear: External ear normal.  Left Ear: External ear normal.  Eyes: . Pupils are equal, round, and reactive to light. Conjunctivae and EOM are normal Nose:  without d/c or deformity Neck: Neck supple. Gross normal ROM Cardiovascular: Normal rate and regular rhythm.   Pulmonary/Chest: Effort normal and breath sounds without rales or wheezing.  Abd:  Soft, NT, ND, + BS, no organomegaly Neurological: Pt is alert. At baseline orientation, motor grossly intact Skin: Skin is warm. No rashes, other new lesions, no LE edema Psychiatric: Pt behavior is normal without agitation  No other exam findings Lab Results  Component Value Date   WBC 6.4 07/21/2018   HGB 11.3 (L) 07/21/2018   HCT 33.8 (L) 07/21/2018   PLT 165.0 07/21/2018   GLUCOSE 266 (H) 07/21/2018   CHOL 146 07/21/2018   TRIG 70.0 07/21/2018   HDL 49.50 07/21/2018   LDLCALC 82 07/21/2018   ALT 16 07/21/2018   AST 18 07/21/2018   NA 138 07/21/2018   K 4.8 07/21/2018   CL 104 07/21/2018   CREATININE 1.40 (H) 07/21/2018   BUN 51 (H) 07/21/2018   CO2 25 07/21/2018   TSH 1.86 07/21/2018   INR 1.11 01/15/2014   HGBA1C 9.4 (H) 07/21/2018   MICROALBUR <0.7 01/06/2018       Assessment & Plan:

## 2018-07-21 NOTE — Assessment & Plan Note (Signed)
For pain control, also for coccyx and ls spine films

## 2018-07-22 ENCOUNTER — Encounter: Payer: Self-pay | Admitting: Internal Medicine

## 2018-07-26 ENCOUNTER — Encounter: Payer: Self-pay | Admitting: Internal Medicine

## 2018-07-26 ENCOUNTER — Other Ambulatory Visit: Payer: Self-pay | Admitting: Internal Medicine

## 2018-07-26 MED ORDER — INSULIN GLARGINE 100 UNIT/ML SOLOSTAR PEN: PEN_INJECTOR | SUBCUTANEOUS | 5 refills | Status: DC

## 2018-07-28 ENCOUNTER — Telehealth: Payer: Self-pay

## 2018-07-28 NOTE — Telephone Encounter (Signed)
-----   Message from Biagio Borg, MD sent at 07/26/2018 12:20 PM EST ----- Letter sent, cont same tx, except  The test results show that your current treatment is OK, except the A1c is still too high.  Please increase your insulin from 55 to 65 units per day.  Holly Weiss to please inform pt's family, I will adjust rx

## 2018-07-28 NOTE — Telephone Encounter (Signed)
Pt has been informed of results and expressed understanding.  °

## 2018-09-02 ENCOUNTER — Other Ambulatory Visit: Payer: Self-pay | Admitting: Internal Medicine

## 2018-09-02 DIAGNOSIS — I10 Essential (primary) hypertension: Secondary | ICD-10-CM

## 2018-09-04 ENCOUNTER — Other Ambulatory Visit: Payer: Self-pay | Admitting: Internal Medicine

## 2018-09-05 ENCOUNTER — Other Ambulatory Visit: Payer: Self-pay

## 2018-09-05 DIAGNOSIS — I10 Essential (primary) hypertension: Secondary | ICD-10-CM

## 2018-09-05 MED ORDER — METOPROLOL SUCCINATE ER 100 MG PO TB24: ORAL_TABLET | ORAL | 1 refills | Status: DC

## 2018-10-04 ENCOUNTER — Other Ambulatory Visit: Payer: Self-pay | Admitting: Internal Medicine

## 2018-10-12 ENCOUNTER — Ambulatory Visit: Payer: Self-pay | Admitting: Internal Medicine

## 2018-11-02 ENCOUNTER — Ambulatory Visit (INDEPENDENT_AMBULATORY_CARE_PROVIDER_SITE_OTHER): Payer: Medicare Other | Admitting: Internal Medicine

## 2018-11-02 ENCOUNTER — Encounter: Payer: Self-pay | Admitting: Internal Medicine

## 2018-11-02 ENCOUNTER — Other Ambulatory Visit: Payer: Self-pay | Admitting: Internal Medicine

## 2018-11-02 ENCOUNTER — Ambulatory Visit (INDEPENDENT_AMBULATORY_CARE_PROVIDER_SITE_OTHER): Payer: Medicare Other | Admitting: Family Medicine

## 2018-11-02 ENCOUNTER — Telehealth: Payer: Self-pay

## 2018-11-02 ENCOUNTER — Ambulatory Visit: Payer: Self-pay

## 2018-11-02 ENCOUNTER — Other Ambulatory Visit (INDEPENDENT_AMBULATORY_CARE_PROVIDER_SITE_OTHER): Payer: Medicare Other

## 2018-11-02 ENCOUNTER — Encounter: Payer: Self-pay | Admitting: Family Medicine

## 2018-11-02 ENCOUNTER — Ambulatory Visit (INDEPENDENT_AMBULATORY_CARE_PROVIDER_SITE_OTHER)
Admission: RE | Admit: 2018-11-02 | Discharge: 2018-11-02 | Disposition: A | Discharge status: Home / Self Care | Payer: Medicare Other | Source: Ambulatory Visit | Attending: Family Medicine | Admitting: Family Medicine

## 2018-11-02 ENCOUNTER — Other Ambulatory Visit: Payer: Self-pay

## 2018-11-02 VITALS — Ht 61.0 in | Wt 214.0 lb

## 2018-11-02 VITALS — BP 118/72 | HR 62 | Temp 97.7°F | Ht 61.0 in

## 2018-11-02 DIAGNOSIS — M25511 Pain in right shoulder: Secondary | ICD-10-CM

## 2018-11-02 DIAGNOSIS — H409 Unspecified glaucoma: Secondary | ICD-10-CM

## 2018-11-02 DIAGNOSIS — Z0001 Encounter for general adult medical examination with abnormal findings: Secondary | ICD-10-CM

## 2018-11-02 DIAGNOSIS — Z23 Encounter for immunization: Secondary | ICD-10-CM | POA: Diagnosis not present

## 2018-11-02 DIAGNOSIS — E119 Type 2 diabetes mellitus without complications: Secondary | ICD-10-CM | POA: Diagnosis not present

## 2018-11-02 DIAGNOSIS — F0391 Unspecified dementia with behavioral disturbance: Secondary | ICD-10-CM | POA: Diagnosis not present

## 2018-11-02 DIAGNOSIS — F0392 Unspecified dementia, unspecified severity, with psychotic disturbance: Secondary | ICD-10-CM

## 2018-11-02 DIAGNOSIS — R269 Unspecified abnormalities of gait and mobility: Secondary | ICD-10-CM | POA: Diagnosis not present

## 2018-11-02 DIAGNOSIS — M75101 Unspecified rotator cuff tear or rupture of right shoulder, not specified as traumatic: Secondary | ICD-10-CM | POA: Insufficient documentation

## 2018-11-02 DIAGNOSIS — S46011A Strain of muscle(s) and tendon(s) of the rotator cuff of right shoulder, initial encounter: Secondary | ICD-10-CM | POA: Diagnosis not present

## 2018-11-02 DIAGNOSIS — Z0000 Encounter for general adult medical examination without abnormal findings: Secondary | ICD-10-CM | POA: Diagnosis not present

## 2018-11-02 LAB — BASIC METABOLIC PANEL
BUN: 39 mg/dL — ABNORMAL HIGH (ref 6–23)
CO2: 26 mEq/L (ref 19–32)
Calcium: 9.9 mg/dL (ref 8.4–10.5)
Chloride: 104 mEq/L (ref 96–112)
Creatinine, Ser: 1.14 mg/dL (ref 0.40–1.20)
GFR: 55.79 mL/min — ABNORMAL LOW (ref >60.00)
Glucose, Bld: 166 mg/dL — ABNORMAL HIGH (ref 70–99)
Potassium: 4.8 mEq/L (ref 3.5–5.1)
Sodium: 138 mEq/L (ref 135–145)

## 2018-11-02 LAB — CBC WITH DIFFERENTIAL/PLATELET
Basophils Absolute: 0.1 10*3/uL (ref 0.0–0.1)
Basophils Relative: 1.3 % (ref 0.0–3.0)
Eosinophils Absolute: 0.3 10*3/uL (ref 0.0–0.7)
Eosinophils Relative: 4.9 % (ref 0.0–5.0)
HCT: 35 % — ABNORMAL LOW (ref 36.0–46.0)
Hemoglobin: 11.7 g/dL — ABNORMAL LOW (ref 12.0–15.0)
Lymphocytes Relative: 34.9 % (ref 12.0–46.0)
Lymphs Abs: 1.8 10*3/uL (ref 0.7–4.0)
MCHC: 33.3 g/dL (ref 30.0–36.0)
MCV: 90.1 fl (ref 78.0–100.0)
Monocytes Absolute: 0.6 10*3/uL (ref 0.1–1.0)
Monocytes Relative: 11.4 % (ref 3.0–12.0)
Neutro Abs: 2.5 10*3/uL (ref 1.4–7.7)
Neutrophils Relative %: 47.5 % (ref 43.0–77.0)
Platelets: 167 10*3/uL (ref 150.0–400.0)
RBC: 3.89 Mil/uL (ref 3.87–5.11)
RDW: 15.1 % (ref 11.5–15.5)
WBC: 5.2 10*3/uL (ref 4.0–10.5)

## 2018-11-02 LAB — URINALYSIS, ROUTINE W REFLEX MICROSCOPIC
Bilirubin Urine: NEGATIVE
Hgb urine dipstick: NEGATIVE
Ketones, ur: NEGATIVE
Nitrite: NEGATIVE
RBC / HPF: NONE SEEN (ref 0–?)
Specific Gravity, Urine: 1.015 (ref 1.000–1.030)
Total Protein, Urine: NEGATIVE
Urine Glucose: NEGATIVE
Urobilinogen, UA: 0.2 (ref 0.0–1.0)
pH: 6.5 (ref 5.0–8.0)

## 2018-11-02 LAB — HEPATIC FUNCTION PANEL
ALT: 16 U/L (ref 0–35)
AST: 18 U/L (ref 0–37)
Albumin: 3.9 g/dL (ref 3.5–5.2)
Alkaline Phosphatase: 108 U/L (ref 39–117)
Bilirubin, Direct: 0.1 mg/dL (ref 0.0–0.3)
Total Bilirubin: 0.3 mg/dL (ref 0.2–1.2)
Total Protein: 7.3 g/dL (ref 6.0–8.3)

## 2018-11-02 LAB — TSH: TSH: 3.52 u[IU]/mL (ref 0.35–4.50)

## 2018-11-02 LAB — LIPID PANEL
Cholesterol: 181 mg/dL (ref 0–200)
HDL: 48.8 mg/dL (ref >39.00)
LDL Cholesterol: 116 mg/dL — ABNORMAL HIGH (ref 0–99)
NonHDL: 132.17
Total CHOL/HDL Ratio: 4
Triglycerides: 79 mg/dL (ref 0.0–149.0)
VLDL: 15.8 mg/dL (ref 0.0–40.0)

## 2018-11-02 LAB — MICROALBUMIN / CREATININE URINE RATIO
Creatinine,U: 53.8 mg/dL
Microalb Creat Ratio: 8.6 mg/g (ref 0.0–30.0)
Microalb, Ur: 4.6 mg/dL — ABNORMAL HIGH (ref 0.0–1.9)

## 2018-11-02 LAB — HEMOGLOBIN A1C: Hgb A1c MFr Bld: 13.4 % — ABNORMAL HIGH (ref 4.6–6.5)

## 2018-11-02 MED ORDER — QUETIAPINE FUMARATE 50 MG PO TABS: 50.0000 mg | ORAL_TABLET | Freq: Every day | ORAL | 3 refills | Status: DC

## 2018-11-02 MED ORDER — INSULIN GLARGINE 100 UNIT/ML SOLOSTAR PEN: PEN_INJECTOR | SUBCUTANEOUS | 5 refills | Status: DC

## 2018-11-02 NOTE — Telephone Encounter (Signed)
Attempted to call pt, VM is not set up to receive msgs.   CRM created.

## 2018-11-02 NOTE — Assessment & Plan Note (Signed)
stable overall by history and exam, recent data reviewed with pt, and pt to continue medical treatment as before,  to f/u any worsening symptoms or concerns  

## 2018-11-02 NOTE — Telephone Encounter (Signed)
-----   Message from Biagio Borg, MD sent at 11/02/2018  2:41 PM EDT ----- Left message on MyChart, pt to cont same tx except  The test results show that your current treatment is OK, except the A1c is much higher.  We need to increase the Lantus from your current 65 units, to 75 units per day.  I will send a new prescription, and you should hear from the office as well.    Holly Weiss to please inform pt Family (pt has dementia), I will do rx

## 2018-11-02 NOTE — Assessment & Plan Note (Signed)
Declines PT for now, urged pt to walk with walker at all times,  to f/u any worsening symptoms or concerns

## 2018-11-02 NOTE — Assessment & Plan Note (Signed)
Due to f/u optho , will refer

## 2018-11-02 NOTE — Assessment & Plan Note (Signed)
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
## 2018-11-02 NOTE — Assessment & Plan Note (Signed)
Patient has a rotator cuff tear, seems to be full-thickness of the supraspinatus.  Significant weakness.  X-rays ordered to rule out any type of humeral fracture that could also be contributing.  Sling given today.  Discussed vitamin D over-the-counter, discussed icing regimen.  Patient is a very high risk surgical candidate and has many other comorbidities at the moment.  We discussed the possibility of her coming back to further evaluate in 2 to 3 weeks but at this moment secondary to the corona outbreak we will start with a WebEx visit.  Patient as well as daughter is in agreement with the plan.  Follow-up with me again 2 weeks virtual.

## 2018-11-02 NOTE — Progress Notes (Signed)
[facility] [address] [place], [place] [address] Phone: [phone] Subjective:    CC: Right shoulder pain  GLO:[id number]  Holly Weiss is a 78 y.o. female coming in with complaint of right shoulder pain for 2 weeks. She fell on her right side and has been unable to raise her shoulder overhead. Denies any numbness or tingling in her hand.  Patient has had multiple falls over the course of time.  Multiple comorbidities.  Usually in a wheelchair most of the time.  Now the shoulder seems to hurt more.  Right-sided.  Difficulty with lifting it.  Rates the severity of pain is 9 out of 10.  Patient has given multiple stories of how the fall did occur.     Past Medical History:  Diagnosis Date  . ANEMIA-NOS 01/18/2008  . Arthritis   . 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 ASTHMA, WITH ACUTE EXACERBATION 11/29/2008  . Bilateral carpal tunnel syndrome   . Chronic pain syndrome 01/21/2010  . Depression   . DIABETIC  RETINOPATHY 05/30/2007  . DM, UNCOMPLICATED, TYPE II, UNCONTROLLED 05/30/2007  . Edema of both legs    takes Lasix  . Femur fracture, right ([place])   . GERD (gastroesophageal reflux disease)   . 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 History of blood transfusion    spine surgery  . History of kidney stones   . HYPERLIPIDEMIA 05/30/2007  . HYPERTENSION 10/31/2007  . LOW BACK PAIN 01/18/2008  . Migraine headache   . MORBID OBESITY, HX OF 05/30/2007  . NEPHROLITHIASIS, HX OF 01/18/2008  . Numbness and tingling in hands   . Seasonal allergies   . Shortness of breath    with walking  . SHOULDER PAIN, BILATERAL 04/09/2009  . Spinal stenosis   . Urgency of urination    Past Surgical History:  Procedure Laterality Date  . BACK SURGERY    . CATARACT EXTRACTION    . CHOLECYSTECTOMY    . lumbar disc surgury    . ORIF TIBIA FRACTURE Right 01/16/2014   Procedure: RIGHT TIBIA REPAIR NON-UNION/MALUNION TIBIA WITH SLIDING GRAFT;  Surgeon: Renette Butters, MD;   Location: [facility];  Service: Orthopedics;  Laterality: Right;  . THORACIC FUSION    . TOTAL KNEE ARTHROPLASTY WITH REVISION COMPONENTS Right 07/12/2013   Procedure: TOTAL KNEE ARTHROPLASTY WITH TIBIA REVISION COMPONENTS;  Surgeon: Ninetta Lights, MD;  Location: [facility];  Service: Orthopedics;  Laterality: Right;  . TUBAL LIGATION     Social History   Socioeconomic History  . Marital status: Widowed    Spouse name: Not on file  . Number of children: 5  . Years of education: Not on file  . Highest education level: Not on file  Occupational History  . 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 Financial resource strain: Not on file  . Food insecurity:    Worry: Not on file    Inability: Not on file  . Transportation needs:    Medical: Not on file    Non-medical: Not on file  Tobacco Use  . Smoking status: Never Smoker  . Smokeless tobacco: Current User  Substance and Sexual Activity  . Alcohol use: No  . Drug use: No  . Sexual activity: Never    Birth control/protection: Post-menopausal  Lifestyle  . Physical activity:    Days per week: Not on file    Minutes per session:  Not on file  . Stress: Not on file  Relationships  . Social connections:    Talks on phone: Not on file    Gets together: Not on file    Attends religious service: Not on file    Active member of club or organization: Not on file    Attends meetings of clubs or organizations: Not on file    Relationship status: Not on file  Other Topics Concern  . Not on file  Social History Narrative   She is married to her second husband for about 24 yrs. He has MS and is in a wheelchair - died Sep 14, 2009. She has five grown children from her first marriage.   Allergies  Allergen Reactions  . Adhesive [Tape] Itching  . Endocet [Oxycodone-Acetaminophen] Itching  . Latex Itching  . Oxycodone-Acetaminophen Itching   Family History  Problem  Relation Age of Onset  . Diabetes Mother   . Heart disease Mother   . Dementia Mother   . Lung cancer Father   . Heart disease Father   . Diabetes Sister   . Depression Sister     Current Outpatient Medications (Endocrine & Metabolic):  Marland Kitchen  Insulin Glargine (LANTUS SOLOSTAR) 100 UNIT/ML Solostar Pen, ADMINISTER 55 UNITS UNDER THE SKIN DAILY .  insulin lispro (HUMALOG KWIKPEN) 100 UNIT/ML KiwkPen, 15 units sq each evening at dinner .  insulin lispro (HUMALOG KWIKPEN) 100 UNIT/ML KwikPen, ADMINISTER 15 UNITS UNDER THE SKIN EVERY EVENING AT DINNER  Current Outpatient Medications (Cardiovascular):  .  furosemide (LASIX) 40 MG tablet, TAKE 1 TABLET BY MOUTH TWICE DAILY AS NEEDED FOR PERSISTENT SWELLING .  metoprolol succinate (TOPROL-XL) 100 MG 24 hr tablet, TAKE 1 TABLET BY MOUTH EVERY DAY WITH OR IMMEDIATELY FOLLOWING A MEAL .  rosuvastatin (CRESTOR) 20 MG tablet, TAKE 1/2 TABLET(10 MG) BY MOUTH DAILY  Current Outpatient Medications (Respiratory):  .  albuterol (VENTOLIN HFA) 108 (90 Base) MCG/ACT inhaler, INHALE 2 PUFFS INTO THE LUNGS TWICE DAILY AS NEEDED FOR WHEEZING OR SHORTNESS OF BREATH .  triamcinolone (NASACORT) 55 MCG/ACT AERO nasal inhaler, Place 2 sprays into the nose daily.  Current Outpatient Medications (Analgesics):  .  aspirin 81 MG tablet, Take 1 tablet (81 mg total) by mouth daily. .  traMADol (ULTRAM) 50 MG tablet, Take 1 tablet (50 mg total) by mouth every 6 (six) hours as needed.   Current Outpatient Medications (Other):  Marland Kitchen  B-D ULTRAFINE III SHORT PEN 31G X 8 MM MISC, USE TO CHECK BLOOD SUGERS TWICE DAILY .  Blood Glucose Monitoring Suppl ([place]) w/Device KIT, Use as directed once daily to check blood sugar.  Diagnosis code 250.02 .  Calcium Carbonate-Vitamin D (CALTRATE 600+D PO), Take 2 tablets by mouth daily. .  Continuous Blood Gluc Receiver (FREESTYLE LIBRE 14 DAY READER) DEVI, Apply 1 Device topically daily. Use as directed daily E11.9 .   Continuous Blood Gluc Sensor (FREESTYLE LIBRE 14 DAY SENSOR) MISC, Apply 1 Device topically every 14 (fourteen) days. E11.9 .  donepezil (ARICEPT) 5 MG tablet, TAKE 1 TABLET BY MOUTH EVERY NIGHT AT BEDTIME .  glucose blood (ONE TOUCH TEST STRIPS) test strip, Use as directed once daily to check blood sugar.  Diagnosis code E11.9 .  Insulin Pen Needle (B-D ULTRAFINE III SHORT PEN) 31G X 8 MM MISC, USE TO CHECK BLOOD SUGERS TWICE DAILY .  Magnesium 250 MG TABS, Take 1 tablet (250 mg total) by mouth daily. .  Multiple Vitamins-Minerals (ONE-A-DAY EXTRAS ANTIOXIDANT PO),  Take 1 tablet by mouth daily.  .  ONE TOUCH LANCETS MISC, Use as directed once daily to check blood sugar. DX E11.09 .  potassium chloride (K-DUR) 10 MEQ tablet, TAKE 1 TABLET BY MOUTH DAILY,(*ONLY ON DAY THE LASIX IS TAKEN*) .  QUEtiapine (SEROQUEL) 50 MG tablet, Take 1 tablet (50 mg total) by mouth at bedtime. Marland Kitchen  tiZANidine (ZANAFLEX) 4 MG tablet, TAKE 1 TABLET BY MOUTH EVERY 8 HOURS AS NEEDED FOR MUSCLE SPASMS    Past medical history, social, surgical and family history all reviewed in electronic medical record.  No pertanent information unless stated regarding to the chief complaint.   Review of Systems:  No headache, visual changes, nausea, vomiting, diarrhea, constipation, dizziness, abdominal pain, skin rash, fevers, chills, night sweats, weight loss, swollen lymph nodes, , chest pain, shortness of breath, mood changes.  Positive muscle aches, body aches, joint swelling  Objective  Height 5' 1"  (1.549 m), weight 214 lb (97.1 kg).   General: No apparent distress alert and but difficulty with orientation patient is wearing sunglasses HEENT: Pupils equal, extraocular movements intact  Respiratory: Patient's speak in full sentences and does not appear short of breath  Cardiovascular: 1+ lower extremity edema, non tender, no erythema  Skin: Warm dry intact with no signs of infection or rash on extremities or on axial skeleton.   Abdomen: Soft nontender  Neuro: Cranial nerves II through XII are intact, neurovascularly intact in all extremities with 2+ DTRs and 2+ pulses.  Lymph: No lymphadenopathy of posterior or anterior cervical chain or axillae bilaterally.  Gait patient is in a wheelchair. MSK:   Right shoulder exam does have some swelling.  Patient is tender over the mid humerus.  Potential swelling noted in this area as well.  Patient is neurovascularly intact distally.  Active range of motion is significantly decreased secondary to pain.  Significant weakness of the rotator cuff with 2 out of 5 strength noted.  Mild crepitus with range of motion.  Limited musculoskeletal ultrasound was performed and interpreted by Lyndal Pulley  Patient's right shoulder and on the humerus did not see any true cortical defect.  Patient does have a very large rotator cuff tear noted with retraction of greater than 1.9 cm of the supraspinatus.  Chronic degenerative changes noted as well   Impression and Recommendations:     This case required medical decision making of moderate complexity. The above documentation has been reviewed and is accurate and complete Lyndal Pulley, DO       Note: This dictation was prepared with Dragon dictation along with smaller phrase technology. Any transcriptional errors that result from this process are unintentional.

## 2018-11-02 NOTE — Patient Instructions (Signed)
You had the Tdap tetanus shot today  Please take all new medication as prescribed - the seroquel 50 mg at bedtime for sleep  You will be contacted regarding the referral for: Sports Medicine for the right shoulder problem (to see Dr Tamala Julian now)  You will be contacted regarding the referral for: eye doctor  Please call if you change your mind about having outpatient physical therapy  You should walk with walker for now at all times (not the cane)  Please continue all other medications as before, and refills have been done if requested.  Please have the pharmacy call with any other refills you may need.  Please continue your efforts at being more active, low cholesterol diet, and weight control.  You are otherwise up to date with prevention measures today.  Please keep your appointments with your specialists as you may have planned  Please go to the LAB in the Basement (turn left off the elevator) for the tests to be done today  You will be contacted by phone if any changes need to be made immediately.  Otherwise, you will receive a letter about your results with an explanation, but please check with MyChart first.  In order to help keep our patients safe and at home we are billing the insurance company for a health consult. You could potentially get a copay to a maximum of $15. Do you agree to this in order to obtain our advice about your concerns?  Please return in 3 months, or sooner if needed

## 2018-11-02 NOTE — Assessment & Plan Note (Signed)
?   rotater cuff tear after fall - for sports medicine referral today

## 2018-11-02 NOTE — Patient Instructions (Signed)
Good to see you  Ice 20 minutes 2 times daily. Usually after activity and before bed. Sling daily for comfort but ok to come out of it when needed pennsaid pinkie amount topically 2 times daily as needed. If dry skin then do lotion 15 minutes afterward.  See you on web-ex in 2 weeks.  Worsening pain though you call me.

## 2018-11-02 NOTE — Assessment & Plan Note (Signed)
Ok for seroquel 50 mg qhs prn,  to f/u any worsening symptoms or concerns

## 2018-11-08 ENCOUNTER — Encounter: Payer: Self-pay | Admitting: Internal Medicine

## 2018-11-08 NOTE — Telephone Encounter (Signed)
This encounter was created in error - please disregard.

## 2018-11-16 NOTE — Progress Notes (Signed)
[facility] [address] [place], [place] [address] Phone: [phone] Subjective:    Virtual Visit via Video Note  I connected with Salli Real on 11/17/18 at  3:30 PM EDT by a video enabled telemedicine application and verified that I am speaking with the correct person using two identifiers.   I discussed the limitations of evaluation and management by telemedicine and the availability of in person appointments. The patient expressed understanding and agreed to proceed.  Patient as well as caregiver was on video from their residence and I was in the work setting.    I discussed the assessment and treatment plan with the patient. The patient was provided an opportunity to ask questions and all were answered. The patient agreed with the plan and demonstrated an understanding of the instructions.   The patient was advised to call back or seek an in-person evaluation if the symptoms worsen or if the condition fails to improve as anticipated.  I provided 27 minutes of non-face-to-face time during this encounter.   Lyndal Pulley, DO  More detailed note below  CC: Shoulder pain follow-up  [id number]  Luan Urbani Bari is a 78 y.o. female coming in with complaint of right shoulder pain.  Patient was found rotator cuff tear as well as a distal clavicle fracture that was nondisplaced.  Has been wearing the sling.  Injection helped with the pain approximately 70%.  Patient states able to sleep more comfortably.  Still unable to lift the shoulder regularly.  Some neck pain associated with it.     Past Medical History:  Diagnosis Date  . ANEMIA-NOS 01/18/2008  . Arthritis   . 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 ASTHMA, WITH ACUTE EXACERBATION 11/29/2008  . Bilateral carpal tunnel syndrome   . Chronic pain syndrome 01/21/2010  . Depression   . DIABETIC  RETINOPATHY 05/30/2007  . DM, UNCOMPLICATED, TYPE II, UNCONTROLLED 05/30/2007  . Edema of both legs    takes Lasix  . Femur fracture, right ([place])   . GERD (gastroesophageal reflux disease)   . 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 History of blood transfusion    spine surgery  . History of kidney stones   . HYPERLIPIDEMIA 05/30/2007  . HYPERTENSION 10/31/2007  . LOW BACK PAIN 01/18/2008  . Migraine headache   . MORBID OBESITY, HX OF 05/30/2007  . NEPHROLITHIASIS, HX OF 01/18/2008  . Numbness and tingling in hands   . Seasonal allergies   . Shortness of breath    with walking  . SHOULDER PAIN, BILATERAL 04/09/2009  . Spinal stenosis   . Urgency of urination    Past Surgical History:  Procedure Laterality Date  . BACK SURGERY    . CATARACT EXTRACTION    . CHOLECYSTECTOMY    . lumbar disc surgury    . ORIF TIBIA FRACTURE Right 01/16/2014   Procedure: RIGHT TIBIA REPAIR NON-UNION/MALUNION TIBIA WITH SLIDING GRAFT;  Surgeon: Renette Butters, MD;  Location: [facility];  Service: Orthopedics;  Laterality: Right;  . THORACIC FUSION    . TOTAL KNEE ARTHROPLASTY WITH REVISION COMPONENTS Right 07/12/2013   Procedure: TOTAL KNEE ARTHROPLASTY WITH TIBIA REVISION COMPONENTS;  Surgeon: Ninetta Lights, MD;  Location: [facility];  Service: Orthopedics;  Laterality: Right;  . TUBAL LIGATION     Social History   Socioeconomic History  . Marital status: Widowed    Spouse name: Not on file  . Number of children: 5  . Years of education: Not on  file  . Highest education level: Not on file  Occupational History  . 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 Financial resource strain: Not on file  . Food insecurity:    Worry: Not on file    Inability: Not on file  . Transportation needs:    Medical: Not on file    Non-medical: Not on file  Tobacco Use  . Smoking status: Never Smoker  . Smokeless tobacco: Current User  Substance and Sexual Activity  . Alcohol use: No  . Drug use: No  . Sexual activity: Never    Birth  control/protection: Post-menopausal  Lifestyle  . Physical activity:    Days per week: Not on file    Minutes per session: Not on file  . Stress: Not on file  Relationships  . Social connections:    Talks on phone: Not on file    Gets together: Not on file    Attends religious service: Not on file    Active member of club or organization: Not on file    Attends meetings of clubs or organizations: Not on file    Relationship status: Not on file  Other Topics Concern  . Not on file  Social History Narrative   She is married to her second husband for about 24 yrs. He has MS and is in a wheelchair - died Aug 12, 2009. She has five grown children from her first marriage.   Allergies  Allergen Reactions  . Adhesive [Tape] Itching  . Endocet [Oxycodone-Acetaminophen] Itching  . Latex Itching  . Oxycodone-Acetaminophen Itching   Family History  Problem Relation Age of Onset  . Diabetes Mother   . Heart disease Mother   . Dementia Mother   . Lung cancer Father   . Heart disease Father   . Diabetes Sister   . Depression Sister     Current Outpatient Medications (Endocrine & Metabolic):  Marland Kitchen  Insulin Glargine (LANTUS SOLOSTAR) 100 UNIT/ML Solostar Pen, ADMINISTER 75 UNITS UNDER THE SKIN DAILY .  insulin lispro (HUMALOG KWIKPEN) 100 UNIT/ML KiwkPen, 15 units sq each evening at dinner .  insulin lispro (HUMALOG KWIKPEN) 100 UNIT/ML KwikPen, ADMINISTER 15 UNITS UNDER THE SKIN EVERY EVENING AT DINNER  Current Outpatient Medications (Cardiovascular):  .  furosemide (LASIX) 40 MG tablet, TAKE 1 TABLET BY MOUTH TWICE DAILY AS NEEDED FOR PERSISTENT SWELLING .  metoprolol succinate (TOPROL-XL) 100 MG 24 hr tablet, TAKE 1 TABLET BY MOUTH EVERY DAY WITH OR IMMEDIATELY FOLLOWING A MEAL .  rosuvastatin (CRESTOR) 20 MG tablet, TAKE 1/2 TABLET(10 MG) BY MOUTH DAILY  Current Outpatient Medications (Respiratory):  .  albuterol (VENTOLIN HFA) 108 (90 Base) MCG/ACT inhaler, INHALE 2 PUFFS INTO THE LUNGS  TWICE DAILY AS NEEDED FOR WHEEZING OR SHORTNESS OF BREATH .  triamcinolone (NASACORT) 55 MCG/ACT AERO nasal inhaler, Place 2 sprays into the nose daily.  Current Outpatient Medications (Analgesics):  .  aspirin 81 MG tablet, Take 1 tablet (81 mg total) by mouth daily. .  traMADol (ULTRAM) 50 MG tablet, Take 1 tablet (50 mg total) by mouth every 6 (six) hours as needed.   Current Outpatient Medications (Other):  Marland Kitchen  B-D ULTRAFINE III SHORT PEN 31G X 8 MM MISC, USE TO CHECK BLOOD SUGERS TWICE DAILY .  Blood Glucose Monitoring Suppl ([place]) w/Device KIT, Use as directed once daily to check blood sugar.  Diagnosis code 250.02 .  Calcium Carbonate-Vitamin D (CALTRATE 600+D PO), Take 2 tablets by mouth daily. .  Continuous Blood Gluc Receiver (FREESTYLE LIBRE 14 DAY READER) DEVI, Apply 1 Device topically daily. Use as directed daily E11.9 .  Continuous Blood Gluc Sensor (FREESTYLE LIBRE 14 DAY SENSOR) MISC, Apply 1 Device topically every 14 (fourteen) days. E11.9 .  donepezil (ARICEPT) 5 MG tablet, TAKE 1 TABLET BY MOUTH EVERY NIGHT AT BEDTIME .  glucose blood (ONE TOUCH TEST STRIPS) test strip, Use as directed once daily to check blood sugar.  Diagnosis code E11.9 .  Insulin Pen Needle (B-D ULTRAFINE III SHORT PEN) 31G X 8 MM MISC, USE TO CHECK BLOOD SUGERS TWICE DAILY .  Magnesium 250 MG TABS, Take 1 tablet (250 mg total) by mouth daily. .  Multiple Vitamins-Minerals (ONE-A-DAY EXTRAS ANTIOXIDANT PO), Take 1 tablet by mouth daily.  .  ONE TOUCH LANCETS MISC, Use as directed once daily to check blood sugar. DX E11.09 .  potassium chloride (K-DUR) 10 MEQ tablet, TAKE 1 TABLET BY MOUTH DAILY,(*ONLY ON DAY THE LASIX IS TAKEN*) .  QUEtiapine (SEROQUEL) 50 MG tablet, Take 1 tablet (50 mg total) by mouth at bedtime. Marland Kitchen  tiZANidine (ZANAFLEX) 4 MG tablet, TAKE 1 TABLET BY MOUTH EVERY 8 HOURS AS NEEDED FOR MUSCLE SPASMS    Past medical history, social, surgical and family history all  reviewed in electronic medical record.  No pertanent information unless stated regarding to the chief complaint.   Review of Systems:  No headache, visual changes, nausea, vomiting, diarrhea, constipation, dizziness, abdominal pain, skin rash, fevers, chills, night sweats, weight loss, swollen lymph nodes, body aches, joint swelling,, chest pain, shortness of breath, mood changes.  Positive muscle aches  Objective     General: No apparent distress alert and oriented x3 mood and affect normal, dressed appropriately.  HEENT: Pupils equal, extraocular movements intact  Patient was unable to lift right shoulder greater than 30 degrees of flexion    Impression and Recommendations:    . The above documentation has been reviewed and is accurate and complete Lyndal Pulley, DO       Note: This dictation was prepared with Dragon dictation along with smaller phrase technology. Any transcriptional errors that result from this process are unintentional.

## 2018-11-17 ENCOUNTER — Encounter: Payer: Self-pay | Admitting: Family Medicine

## 2018-11-17 ENCOUNTER — Ambulatory Visit (INDEPENDENT_AMBULATORY_CARE_PROVIDER_SITE_OTHER): Payer: Medicare Other | Admitting: Family Medicine

## 2018-11-17 DIAGNOSIS — S46011A Strain of muscle(s) and tendon(s) of the rotator cuff of right shoulder, initial encounter: Secondary | ICD-10-CM | POA: Diagnosis not present

## 2018-11-17 MED ORDER — DICLOFENAC SODIUM 1 % TD GEL: 2.0000 g | Freq: Four times a day (QID) | TRANSDERMAL | 11 refills | Status: DC

## 2018-11-17 NOTE — Assessment & Plan Note (Signed)
Continues to have weakness.  Unable to do physical therapy.  Discussed icing regimen and home exercises on the vitamin D supplementation.  Continue sling for comfort as needed.  Follow-up again in 3 weeks virtually.

## 2018-12-07 ENCOUNTER — Other Ambulatory Visit: Payer: Self-pay | Admitting: Internal Medicine

## 2018-12-08 ENCOUNTER — Encounter: Payer: Self-pay | Admitting: Family Medicine

## 2018-12-08 ENCOUNTER — Ambulatory Visit (INDEPENDENT_AMBULATORY_CARE_PROVIDER_SITE_OTHER): Payer: Medicare Other | Admitting: Family Medicine

## 2018-12-08 DIAGNOSIS — M12811 Other specific arthropathies, not elsewhere classified, right shoulder: Secondary | ICD-10-CM

## 2018-12-08 DIAGNOSIS — M75101 Unspecified rotator cuff tear or rupture of right shoulder, not specified as traumatic: Secondary | ICD-10-CM | POA: Diagnosis not present

## 2018-12-08 DIAGNOSIS — S46011A Strain of muscle(s) and tendon(s) of the rotator cuff of right shoulder, initial encounter: Secondary | ICD-10-CM

## 2018-12-08 NOTE — Assessment & Plan Note (Signed)
Patient has rotator cuff tear as well as a distal clavicle fracture.  Do believe that the distal clavicle fracture has improved significantly.  No repeat x-rays secondary to patient avoiding office is secondary to coronavirus and patient being high risk.  I do believe though that in-home physical therapy could be beneficial and will be ordered today.  We discussed repeating x-rays if necessary.  Patient wants to hold on that.  We will have patient follow-up again virtually in 3 weeks to make sure making some improvement.

## 2018-12-08 NOTE — Progress Notes (Signed)
[facility] [address] [place], [place] [address] Phone: [phone] Subjective:    Virtual Visit via Video Note  I connected with Holly Weiss on 12/08/18 at  2:00 PM EDT by a video enabled telemedicine application and verified that I am speaking with the correct person using two identifiers.  Location: Patient: Patient was in home setting Provider: I was in office setting   I discussed the limitations of evaluation and management by telemedicine and the availability of in person appointments. The patient expressed understanding and agreed to proceed.      I discussed the assessment and treatment plan with the patient. The patient was provided an opportunity to ask questions and all were answered. The patient agreed with the plan and demonstrated an understanding of the instructions.   The patient was advised to call back or seek an in-person evaluation if the symptoms worsen or if the condition fails to improve as anticipated.  I provided 31 minutes of face-to-face time during this encounter.   Lyndal Pulley, DO      CC: Shoulder pain follow-up  [id number]  Holly Weiss is a 78 y.o. female coming in with complaint of right shoulder pain.  Patient was seen previously and did have a distal clavicle fracture as well as a rotator cuff tear.  Patient is a high risk individual for the coronavirus so doing everything virtually.  Patient states that the pain is improved significantly but no improvement in the range of motion.    Past Medical History:  Diagnosis Date  . ANEMIA-NOS 01/18/2008  . Arthritis   . 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 ASTHMA, WITH ACUTE EXACERBATION 11/29/2008  . Bilateral carpal tunnel syndrome   . Chronic pain syndrome 01/21/2010  . Depression   . DIABETIC  RETINOPATHY 05/30/2007  . DM, UNCOMPLICATED, TYPE II, UNCONTROLLED 05/30/2007  . Edema of both legs    takes Lasix  . Femur fracture, right ([place])   . GERD  (gastroesophageal reflux disease)   . 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 History of blood transfusion    spine surgery  . History of kidney stones   . HYPERLIPIDEMIA 05/30/2007  . HYPERTENSION 10/31/2007  . LOW BACK PAIN 01/18/2008  . Migraine headache   . MORBID OBESITY, HX OF 05/30/2007  . NEPHROLITHIASIS, HX OF 01/18/2008  . Numbness and tingling in hands   . Seasonal allergies   . Shortness of breath    with walking  . SHOULDER PAIN, BILATERAL 04/09/2009  . Spinal stenosis   . Urgency of urination    Past Surgical History:  Procedure Laterality Date  . BACK SURGERY    . CATARACT EXTRACTION    . CHOLECYSTECTOMY    . lumbar disc surgury    . ORIF TIBIA FRACTURE Right 01/16/2014   Procedure: RIGHT TIBIA REPAIR NON-UNION/MALUNION TIBIA WITH SLIDING GRAFT;  Surgeon: Renette Butters, MD;  Location: [facility];  Service: Orthopedics;  Laterality: Right;  . THORACIC FUSION    . TOTAL KNEE ARTHROPLASTY WITH REVISION COMPONENTS Right 07/12/2013   Procedure: TOTAL KNEE ARTHROPLASTY WITH TIBIA REVISION COMPONENTS;  Surgeon: Ninetta Lights, MD;  Location: [facility];  Service: Orthopedics;  Laterality: Right;  . TUBAL LIGATION     Social History   Socioeconomic History  . Marital status: Widowed    Spouse name: Not on file  . Number of children: 5  . Years of education: Not on file  . Highest education level: Not on  file  Occupational History  . 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 Financial resource strain: Not on file  . Food insecurity:    Worry: Not on file    Inability: Not on file  . Transportation needs:    Medical: Not on file    Non-medical: Not on file  Tobacco Use  . Smoking status: Never Smoker  . Smokeless tobacco: Current User  Substance and Sexual Activity  . Alcohol use: No  . Drug use: No  . Sexual activity: Never    Birth control/protection: Post-menopausal  Lifestyle  . Physical activity:     Days per week: Not on file    Minutes per session: Not on file  . Stress: Not on file  Relationships  . Social connections:    Talks on phone: Not on file    Gets together: Not on file    Attends religious service: Not on file    Active member of club or organization: Not on file    Attends meetings of clubs or organizations: Not on file    Relationship status: Not on file  Other Topics Concern  . Not on file  Social History Narrative   She is married to her second husband for about 24 yrs. He has MS and is in a wheelchair - died 2009-09-16. She has five grown children from her first marriage.   Allergies  Allergen Reactions  . Adhesive [Tape] Itching  . Endocet [Oxycodone-Acetaminophen] Itching  . Latex Itching  . Oxycodone-Acetaminophen Itching   Family History  Problem Relation Age of Onset  . Diabetes Mother   . Heart disease Mother   . Dementia Mother   . Lung cancer Father   . Heart disease Father   . Diabetes Sister   . Depression Sister     Current Outpatient Medications (Endocrine & Metabolic):  Marland Kitchen  Insulin Glargine (LANTUS SOLOSTAR) 100 UNIT/ML Solostar Pen, ADMINISTER 75 UNITS UNDER THE SKIN DAILY .  insulin lispro (HUMALOG KWIKPEN) 100 UNIT/ML KiwkPen, 15 units sq each evening at dinner .  insulin lispro (HUMALOG KWIKPEN) 100 UNIT/ML KwikPen, ADMINISTER 15 UNITS UNDER THE SKIN EVERY EVENING AT DINNER  Current Outpatient Medications (Cardiovascular):  .  furosemide (LASIX) 40 MG tablet, TAKE 1 TABLET BY MOUTH TWICE DAILY AS NEEDED FOR PERSISTENT SWELLING .  metoprolol succinate (TOPROL-XL) 100 MG 24 hr tablet, TAKE 1 TABLET BY MOUTH EVERY DAY WITH OR IMMEDIATELY FOLLOWING A MEAL .  rosuvastatin (CRESTOR) 20 MG tablet, TAKE 1/2 TABLET BY MOUTH EVERY DAY  Current Outpatient Medications (Respiratory):  .  albuterol (VENTOLIN HFA) 108 (90 Base) MCG/ACT inhaler, INHALE 2 PUFFS INTO THE LUNGS TWICE DAILY AS NEEDED FOR WHEEZING OR SHORTNESS OF BREATH .   triamcinolone (NASACORT) 55 MCG/ACT AERO nasal inhaler, Place 2 sprays into the nose daily.  Current Outpatient Medications (Analgesics):  .  aspirin 81 MG tablet, Take 1 tablet (81 mg total) by mouth daily. .  traMADol (ULTRAM) 50 MG tablet, Take 1 tablet (50 mg total) by mouth every 6 (six) hours as needed.   Current Outpatient Medications (Other):  Marland Kitchen  B-D ULTRAFINE III SHORT PEN 31G X 8 MM MISC, USE TO CHECK BLOOD SUGERS TWICE DAILY .  Blood Glucose Monitoring Suppl ([place]) w/Device KIT, Use as directed once daily to check blood sugar.  Diagnosis code 250.02 .  Calcium Carbonate-Vitamin D (CALTRATE 600+D PO), Take 2  tablets by mouth daily. .  Continuous Blood Gluc Receiver (FREESTYLE LIBRE 14 DAY READER) DEVI, Apply 1 Device topically daily. Use as directed daily E11.9 .  Continuous Blood Gluc Sensor (FREESTYLE LIBRE 14 DAY SENSOR) MISC, Apply 1 Device topically every 14 (fourteen) days. E11.9 .  diclofenac sodium (VOLTAREN) 1 % GEL, Apply 2 g topically 4 (four) times daily. To affected joint. Marland Kitchen  donepezil (ARICEPT) 5 MG tablet, TAKE 1 TABLET BY MOUTH EVERY NIGHT AT BEDTIME .  glucose blood (ONE TOUCH TEST STRIPS) test strip, Use as directed once daily to check blood sugar.  Diagnosis code E11.9 .  Insulin Pen Needle (B-D ULTRAFINE III SHORT PEN) 31G X 8 MM MISC, USE TO CHECK BLOOD SUGERS TWICE DAILY .  Magnesium 250 MG TABS, Take 1 tablet (250 mg total) by mouth daily. .  Multiple Vitamins-Minerals (ONE-A-DAY EXTRAS ANTIOXIDANT PO), Take 1 tablet by mouth daily.  .  ONE TOUCH LANCETS MISC, Use as directed once daily to check blood sugar. DX E11.09 .  potassium chloride (K-DUR) 10 MEQ tablet, TAKE 1 TABLET BY MOUTH DAILY,(*ONLY ON DAY THE LASIX IS TAKEN*) .  QUEtiapine (SEROQUEL) 50 MG tablet, Take 1 tablet (50 mg total) by mouth at bedtime. Marland Kitchen  tiZANidine (ZANAFLEX) 4 MG tablet, TAKE 1 TABLET BY MOUTH EVERY 8 HOURS AS NEEDED FOR MUSCLE SPASMS    Past medical history,  social, surgical and family history all reviewed in electronic medical record.  No pertanent information unless stated regarding to the chief complaint.   Review of Systems:  No headache, visual changes, nausea, vomiting, diarrhea, constipation, dizziness, abdominal pain, skin rash, fevers, chills, night sweats, weight loss, swollen lymph nodes, body aches, joint swelling, muscle aches, chest pain, shortness of breath, mood changes.   Objective    General: No apparent distress alert and oriented x3 mood and affect normal, dressed appropriately.  HEENT: Pupils equal, extraocular movements intact  Patient attempts to pick up the right arm but is unable to do so.  Has only about forward flexion of 40 degrees.  He does show significant weakness of the shoulder girdle.  Sitting in a wheelchair    Impression and Recommendations:     This case required medical decision making of moderate complexity. The above documentation has been reviewed and is accurate and complete Lyndal Pulley, DO       Note: This dictation was prepared with Dragon dictation along with smaller phrase technology. Any transcriptional errors that result from this process are unintentional.

## 2019-02-01 ENCOUNTER — Ambulatory Visit: Payer: Self-pay | Admitting: Internal Medicine

## 2019-02-14 ENCOUNTER — Encounter: Payer: Self-pay | Admitting: Internal Medicine

## 2019-02-14 ENCOUNTER — Other Ambulatory Visit: Payer: Self-pay

## 2019-02-14 ENCOUNTER — Ambulatory Visit (INDEPENDENT_AMBULATORY_CARE_PROVIDER_SITE_OTHER): Payer: Medicare Other | Admitting: Internal Medicine

## 2019-02-14 VITALS — BP 126/74 | HR 69 | Temp 98.6°F | Ht 61.0 in

## 2019-02-14 DIAGNOSIS — I10 Essential (primary) hypertension: Secondary | ICD-10-CM

## 2019-02-14 DIAGNOSIS — F0391 Unspecified dementia with behavioral disturbance: Secondary | ICD-10-CM

## 2019-02-14 DIAGNOSIS — E119 Type 2 diabetes mellitus without complications: Secondary | ICD-10-CM

## 2019-02-14 DIAGNOSIS — F0392 Unspecified dementia, unspecified severity, with psychotic disturbance: Secondary | ICD-10-CM

## 2019-02-14 LAB — POCT GLYCOSYLATED HEMOGLOBIN (HGB A1C): Hemoglobin A1C: 11.3 % — AB (ref 4.0–5.6)

## 2019-02-14 MED ORDER — QUETIAPINE FUMARATE 100 MG PO TABS: 100.0000 mg | ORAL_TABLET | Freq: Two times a day (BID) | ORAL | 3 refills | Status: DC

## 2019-02-14 MED ORDER — FUROSEMIDE 40 MG PO TABS: ORAL_TABLET | ORAL | 3 refills | Status: DC

## 2019-02-14 MED ORDER — METOPROLOL SUCCINATE ER 100 MG PO TB24: ORAL_TABLET | ORAL | 3 refills | Status: DC

## 2019-02-14 MED ORDER — LANTUS SOLOSTAR 100 UNIT/ML ~~LOC~~ SOPN: PEN_INJECTOR | SUBCUTANEOUS | 5 refills | Status: DC

## 2019-02-14 NOTE — Assessment & Plan Note (Signed)
stable overall by history and exam, recent data reviewed with pt, and pt to continue medical treatment as before,  to f/u any worsening symptoms or concerns  

## 2019-02-14 NOTE — Assessment & Plan Note (Signed)
[place] for increased seroquel to 100 bid,  to f/u any worsening symptoms or concerns

## 2019-02-14 NOTE — Patient Instructions (Addendum)
Your A1c was high today at 11.3, though this better than 13.4 at your last visit  We need to:  Increase the lantus to 90 units per day  OK to increase the seroquel to 100 mg twice per day  Please continue all other medications as before, and refills have been done if requested.  Please have the pharmacy call with any other refills you may need.  Please continue your efforts at being more active, low cholesterol diet, and weight control.  Please keep your appointments with your specialists as you may have planned  No other blood work needed today  Please return in 3 months, or sooner if needed

## 2019-02-14 NOTE — Progress Notes (Addendum)
Subjective:    Patient ID: Holly Weiss, female    DOB: Nov 24, 1940, 78 y.o.   MRN: [id number]  HPI  Here to f/u; overall doing ok,  Pt denies chest pain, increasing sob or doe, wheezing, orthopnea, PND, increased LE swelling, palpitations, dizziness or syncope.  Pt denies new neurological symptoms such as new headache, or facial or extremity weakness or numbness.  Pt denies polydipsia, polyuria, or low sugar episode.  Pt states overall good compliance with meds, mostly trying to follow appropriate diet, with wt overall stable,  but little exercise however.  Dementia overall stable overall, and not assoc with behavioral changes such as  paranoia, or agitation but has had increased delusional thoughts and.possible visual hallucinations, as she is convinced there is some man in her house that keeps "messing with my leg." Wt Readings from Last 3 Encounters:  11/02/18 214 lb (97.1 kg)  09/24/16 214 lb (97.1 kg)  01/28/16 196 lb (93.2 kg)  Calling police at night sometimes for someone in the house, thinks she saw a snake one time.   Pt denies fever, wt loss, night sweats, loss of appetite, or other constitutional symptoms Past Medical History:  Diagnosis Date  . ANEMIA-NOS 01/18/2008  . Arthritis   . 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 ASTHMA, WITH ACUTE EXACERBATION 11/29/2008  . Bilateral carpal tunnel syndrome   . Chronic pain syndrome 01/21/2010  . Depression   . DIABETIC  RETINOPATHY 05/30/2007  . DM, UNCOMPLICATED, TYPE II, UNCONTROLLED 05/30/2007  . Edema of both legs    takes Lasix  . Femur fracture, right ([place])   . GERD (gastroesophageal reflux disease)   . 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 History of blood transfusion    spine surgery  . History of kidney stones   . HYPERLIPIDEMIA 05/30/2007  . HYPERTENSION 10/31/2007  . LOW BACK PAIN 01/18/2008  . Migraine headache   . MORBID OBESITY, HX OF 05/30/2007  . NEPHROLITHIASIS, HX OF 01/18/2008  . Numbness and tingling in hands   . Seasonal  allergies   . Shortness of breath    with walking  . SHOULDER PAIN, BILATERAL 04/09/2009  . Spinal stenosis   . Urgency of urination    Past Surgical History:  Procedure Laterality Date  . BACK SURGERY    . CATARACT EXTRACTION    . CHOLECYSTECTOMY    . lumbar disc surgury    . ORIF TIBIA FRACTURE Right 01/16/2014   Procedure: RIGHT TIBIA REPAIR NON-UNION/MALUNION TIBIA WITH SLIDING GRAFT;  Surgeon: Renette Butters, MD;  Location: [facility];  Service: Orthopedics;  Laterality: Right;  . THORACIC FUSION    . TOTAL KNEE ARTHROPLASTY WITH REVISION COMPONENTS Right 07/12/2013   Procedure: TOTAL KNEE ARTHROPLASTY WITH TIBIA REVISION COMPONENTS;  Surgeon: Ninetta Lights, MD;  Location: [facility];  Service: Orthopedics;  Laterality: Right;  . TUBAL LIGATION      reports that she has never smoked. She uses smokeless tobacco. She reports that she does not drink alcohol or use drugs. family history includes Dementia in her mother; Depression in her sister; Diabetes in her mother and sister; Heart disease in her father and mother; Lung cancer in her father. Allergies  Allergen Reactions  . Adhesive [Tape] Itching  . Endocet [Oxycodone-Acetaminophen] Itching  . Latex Itching  . Oxycodone-Acetaminophen Itching   Current Outpatient Medications on File Prior to Visit  Medication Sig Dispense Refill  . albuterol (VENTOLIN HFA) 108 (90 Base) MCG/ACT inhaler INHALE 2 PUFFS INTO THE LUNGS TWICE  DAILY AS NEEDED FOR WHEEZING OR SHORTNESS OF BREATH 18 g 3  . aspirin 81 MG tablet Take 1 tablet (81 mg total) by mouth daily. 30 tablet 0  . B-D ULTRAFINE III SHORT PEN 31G X 8 MM MISC USE TO CHECK BLOOD SUGERS TWICE DAILY 100 each 0  . Blood Glucose Monitoring Suppl ([place]) w/Device KIT Use as directed once daily to check blood sugar.  Diagnosis code 250.02 1 each 0  . Calcium Carbonate-Vitamin D (CALTRATE 600+D PO) Take 2 tablets by mouth daily.    . Continuous Blood Gluc Receiver (FREESTYLE LIBRE  14 DAY READER) DEVI Apply 1 Device topically daily. Use as directed daily E11.9 1 Device 0  . Continuous Blood Gluc Sensor (FREESTYLE LIBRE 14 DAY SENSOR) MISC Apply 1 Device topically every 14 (fourteen) days. E11.9 6 each 3  . diclofenac sodium (VOLTAREN) 1 % GEL Apply 2 g topically 4 (four) times daily. To affected joint. 100 g 11  . donepezil (ARICEPT) 5 MG tablet TAKE 1 TABLET BY MOUTH EVERY NIGHT AT BEDTIME    . glucose blood (ONE TOUCH TEST STRIPS) test strip Use as directed once daily to check blood sugar.  Diagnosis code E11.9 100 each 11  . insulin lispro (HUMALOG KWIKPEN) 100 UNIT/ML KiwkPen 15 units sq each evening at dinner 15 mL 3  . insulin lispro (HUMALOG KWIKPEN) 100 UNIT/ML KwikPen ADMINISTER 15 UNITS UNDER THE SKIN EVERY EVENING AT DINNER 15 mL 2  . Insulin Pen Needle (B-D ULTRAFINE III SHORT PEN) 31G X 8 MM MISC USE TO CHECK BLOOD SUGERS TWICE DAILY 100 each 3  . Magnesium 250 MG TABS Take 1 tablet (250 mg total) by mouth daily. 90 tablet 0  . Multiple Vitamins-Minerals (ONE-A-DAY EXTRAS ANTIOXIDANT PO) Take 1 tablet by mouth daily.     . ONE TOUCH LANCETS MISC Use as directed once daily to check blood sugar. DX E11.09 100 each 3  . potassium chloride (K-DUR) 10 MEQ tablet TAKE 1 TABLET BY MOUTH DAILY,(*ONLY ON DAY THE LASIX IS TAKEN*) 180 tablet 3  . rosuvastatin (CRESTOR) 20 MG tablet TAKE 1/2 TABLET BY MOUTH EVERY DAY 90 tablet 0  . tiZANidine (ZANAFLEX) 4 MG tablet TAKE 1 TABLET BY MOUTH EVERY 8 HOURS AS NEEDED FOR MUSCLE SPASMS 300 tablet 0  . traMADol (ULTRAM) 50 MG tablet Take 1 tablet (50 mg total) by mouth every 6 (six) hours as needed. 30 tablet 0  . triamcinolone (NASACORT) 55 MCG/ACT AERO nasal inhaler Place 2 sprays into the nose daily. 1 Inhaler 12   No current facility-administered medications on file prior to visit.     Review of Systems  Constitutional: Negative for other unusual diaphoresis or sweats HENT: Negative for ear discharge or swelling Eyes:  Negative for other worsening visual disturbances Respiratory: Negative for stridor or other swelling  Gastrointestinal: Negative for worsening distension or other blood Genitourinary: Negative for retention or other urinary change Musculoskeletal: Negative for other MSK pain or swelling Skin: Negative for color change or other new lesions Neurological: Negative for worsening tremors and other numbness  Psychiatric/Behavioral: Negative for worsening agitation or other fatigue All other system neg per pt    Objective:   Physical Exam BP 126/74   Pulse 69   Temp 98.6 F (37 C) (Oral)   Ht 5' 1"  (1.549 m)   SpO2 96%   BMI 40.43 kg/m  VS noted,  Constitutional: Pt appears in NAD HENT: Head: NCAT.  Right Ear: External ear  normal.  Left Ear: External ear normal.  Eyes: . Pupils are equal, round, and reactive to light. Conjunctivae and EOM are normal Nose: without d/c or deformity Neck: Neck supple. Gross normal ROM Cardiovascular: Normal rate and regular rhythm.   Pulmonary/Chest: Effort normal and breath sounds without rales or wheezing.  Neurological: Pt is alert. At baseline orientation, motor grossly intact Skin: Skin is warm. No rashes, other new lesions, no LE edema Psychiatric: Pt behavior is normal without agitation but with delusional statements No other exam findings Lab Results  Component Value Date   WBC 5.2 11/02/2018   HGB 11.7 (L) 11/02/2018   HCT 35.0 (L) 11/02/2018   PLT 167.0 11/02/2018   GLUCOSE 166 (H) 11/02/2018   CHOL 181 11/02/2018   TRIG 79.0 11/02/2018   HDL 48.80 11/02/2018   LDLCALC 116 (H) 11/02/2018   ALT 16 11/02/2018   AST 18 11/02/2018   NA 138 11/02/2018   K 4.8 11/02/2018   CL 104 11/02/2018   CREATININE 1.14 11/02/2018   BUN 39 (H) 11/02/2018   CO2 26 11/02/2018   TSH 3.52 11/02/2018   INR 1.11 01/15/2014   HGBA1C 11.3 (A) 02/14/2019   MICROALBUR 4.6 (H) 11/02/2018  POCT glycosylated hemoglobin (Hb A1C) Order: [id number] Status:   Final result Visible to patient:  No (not released) Dx:  Type 2 diabetes mellitus without comp...  Ref Range & Units 1d ago (02/14/19) 63moago (11/02/18) 66mogo (07/21/18) 1y53yro (01/06/18) 23yr75yr (05/14/17) 30yr 33yr(09/24/16) 30yr a66yr7/25/17)  Hemoglobin A1C 4.0 - 5.6 % 11.3Abnormal   13.4High  R, CM  9.4High  R, CM  8.3High  R, CM  9.3High  R, CM  10.7High  R, CM  7.6High             Assessment & Plan:

## 2019-02-14 NOTE — Assessment & Plan Note (Signed)
A1c improved, but still severe uncontrolled, for increased lantus to 90 units daily, cont check home cbgs and diet

## 2019-03-02 ENCOUNTER — Other Ambulatory Visit: Payer: Self-pay | Admitting: Internal Medicine

## 2019-04-05 ENCOUNTER — Other Ambulatory Visit: Payer: Self-pay | Admitting: Internal Medicine

## 2019-04-05 MED ORDER — INSULIN LISPRO (1 UNIT DIAL) 100 UNIT/ML (KWIKPEN): PEN_INJECTOR | SUBCUTANEOUS | 2 refills | Status: DC

## 2019-04-05 NOTE — Telephone Encounter (Signed)
Requested Prescriptions  Pending Prescriptions Disp Refills  . insulin lispro (HUMALOG KWIKPEN) 100 UNIT/ML KwikPen 15 mL 2    Sig: ADMINISTER 15 UNITS UNDER THE SKIN EVERY EVENING AT DINNER     Endocrinology:  Diabetes - Insulins Failed - 04/05/2019  4:21 PM      Failed - HBA1C is between 0 and 7.9 and within 180 days    Hemoglobin A1C  Date Value Ref Range Status  02/14/2019 11.3 (A) 4.0 - 5.6 % Final   Hgb A1c MFr Bld  Date Value Ref Range Status  11/02/2018 13.4 (H) 4.6 - 6.5 % Final    Comment:    Glycemic Control Guidelines for People with Diabetes:Non Diabetic:  <6%Goal of Therapy: <7%Additional Action Suggested:  >8%          Passed - Valid encounter within last 6 months    Recent Outpatient Visits          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 tiZANidine (ZANAFLEX) 4 MG tablet 300 tablet 0     Not Delegated - Cardiovascular:  Alpha-2 Agonists - tizanidine Failed - 04/05/2019  4:21 PM      Failed - This refill cannot be delegated      Passed - Valid encounter within last 6 months    Recent Outpatient Visits          1 month ago Dementia with psychosis [facility])   [facility] -Georges Mouse, MD   3 months ago Right rotator cuff tear arthropathy   [place], [place], DO   4 months ago Traumatic complete tear of right rotator cuff, initial encounter   [place], [place], DO   5 months ago Acute pain of right shoulder   [place], [place], DO   5 months ago Encounter for well adult exam with abnormal findings   [facility] -Georges Mouse, MD      Future Appointments            In 1 month Jenny Reichmann, Hunt Oris, MD [place], [employer]

## 2019-04-05 NOTE — Telephone Encounter (Signed)
Medication Refill - Medication: Insulin Glargine (LANTUS SOLOSTAR) 100 UNIT/ML Solostar Pen, insulin lispro (HUMALOG KWIKPEN) 100 UNIT/ML KiwkPen, tiZANidine (ZANAFLEX) 4 MG tablet, Calcium,      Has the patient contacted their pharmacy? No. Pt is requesting to have wider needles.  (Agent: If no, request that the patient contact the pharmacy for the refill.) (Agent: If yes, when and what did the pharmacy advise?)  Preferred Pharmacy (with phone number or street name):  [facility] [place] - [address] [place]  [place] [address]  Phone: [phone] Fax: [phone]  Not a 24 hour pharmacy; exact hours not known.     Agent: Please be advised that RX refills may take up to 3 business days. We ask that you follow-up with your pharmacy.

## 2019-04-05 NOTE — Telephone Encounter (Signed)
Requested medications are due for refill today?  Yes   Requested medications are on the active medication list?  Yes  Last refill - 10/04/2018 # 300, 0 refill  Future visit scheduled?  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[place])   [name], James W, MD   3 months ago Right rotator cuff tear arthropathy   [place], [place], DO   4 months ago Traumatic complete tear of right rotator cuff, initial encounter   [place], [place], DO   5 months ago Acute pain of right shoulder   [place], [place], DO   5 months ago Encounter for well adult exam with abnormal findings   [facility] -Georges Mouse, MD      Future Appointments            In 1 month Biagio Borg, MD [place], [facility]           Signed Prescriptions Disp Refills   insulin lispro (HUMALOG KWIKPEN) 100 UNIT/ML KwikPen 15 mL 2    Sig: ADMINISTER 15 UNITS UNDER THE SKIN EVERY EVENING AT [place]     Endocrinology:  Diabetes - Insulins Failed - 04/05/2019  4:21 PM      Failed - HBA1C is between 0 and 7.9 and within 180 days    Hemoglobin A1C  Date Value Ref Range Status  02/14/2019 11.3 (A) 4.0 - 5.6 % Final   Hgb A1c MFr Bld  Date Value Ref Range Status  11/02/2018 13.4 (H) 4.6 - 6.5 % Final    Comment:    Glycemic Control Guidelines for People with Diabetes:Non Diabetic:  <6%Goal of Therapy: <7%Additional Action Suggested:  >8%          Passed - Valid  encounter within last 6 months    Recent Outpatient Visits          1 month 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

## 2019-04-05 NOTE — Telephone Encounter (Signed)
Patient is requesting a larger needle size for her Quik pen which will be a new Rx. She is also requesting calcium which is a historical medication.

## 2019-04-06 MED ORDER — TIZANIDINE HCL 2 MG PO TABS: 2.0000 mg | ORAL_TABLET | Freq: Four times a day (QID) | ORAL | 1 refills | Status: DC | PRN

## 2019-04-11 ENCOUNTER — Telehealth: Payer: Self-pay | Admitting: Internal Medicine

## 2019-04-11 NOTE — Telephone Encounter (Signed)
I dont really see a question or request.  Ok to take all meds as prescribed

## 2019-04-11 NOTE — Telephone Encounter (Signed)
AccessNurse Call: 04/08/2019 at 5:32pm  Patient called and left a message for the after hours nurse stating that she is on a medication and has now developed a headache with it. She has questions about another medication that is an injections. She thinks it might be the wrong medication because she has never taken an injection before and she is not sure where the medication came from.  Nurse called the patient back.. Patient states that she had someone throw some medicine all over her floor and inside of her house and then they ran. They were injectable medications and during the night some of the medications were missing. Patient states that her head hurts and she thinks she was given a different kind of insulin. She usually uses Lantus Solostar. They called the pharmacy to see if they delivered her lantus. The pharmacy said that her medicine was picked up today and usually a family member picks it up for her.

## 2019-04-28 ENCOUNTER — Telehealth: Payer: Self-pay | Admitting: Internal Medicine

## 2019-04-28 DIAGNOSIS — I10 Essential (primary) hypertension: Secondary | ICD-10-CM

## 2019-04-28 NOTE — Telephone Encounter (Signed)
Medication: metoprolol succinate (TOPROL-XL) 100 MG 24 hr tablet [id number]   Has the patient contacted their pharmacy? Yes  (Agent: If no, request that the patient contact the pharmacy for the refill.) (Agent: If yes, when and what did the pharmacy advise?)  Preferred Pharmacy (with phone number or street name):  [facility] [place], [place] - [address] [place] [phone] (Phone) [phone] (Fax)   Agent: Please be advised that RX refills may take up to 3 business days. We ask that you follow-up with your pharmacy.

## 2019-05-01 MED ORDER — METOPROLOL SUCCINATE ER 100 MG PO TB24: ORAL_TABLET | ORAL | 3 refills | Status: DC

## 2019-05-17 ENCOUNTER — Ambulatory Visit: Payer: Medicare Other | Admitting: Internal Medicine

## 2019-06-19 ENCOUNTER — Ambulatory Visit (INDEPENDENT_AMBULATORY_CARE_PROVIDER_SITE_OTHER): Payer: Medicare Other | Admitting: Internal Medicine

## 2019-06-19 ENCOUNTER — Other Ambulatory Visit (INDEPENDENT_AMBULATORY_CARE_PROVIDER_SITE_OTHER): Payer: Medicare Other

## 2019-06-19 ENCOUNTER — Encounter: Payer: Self-pay | Admitting: Internal Medicine

## 2019-06-19 ENCOUNTER — Other Ambulatory Visit: Payer: Self-pay | Admitting: Internal Medicine

## 2019-06-19 ENCOUNTER — Other Ambulatory Visit: Payer: Self-pay

## 2019-06-19 VITALS — BP 128/70 | HR 72 | Temp 98.7°F | Wt 208.6 lb

## 2019-06-19 DIAGNOSIS — F0391 Unspecified dementia with behavioral disturbance: Secondary | ICD-10-CM | POA: Diagnosis not present

## 2019-06-19 DIAGNOSIS — E119 Type 2 diabetes mellitus without complications: Secondary | ICD-10-CM

## 2019-06-19 DIAGNOSIS — J4520 Mild intermittent asthma, uncomplicated: Secondary | ICD-10-CM

## 2019-06-19 DIAGNOSIS — Z23 Encounter for immunization: Secondary | ICD-10-CM

## 2019-06-19 DIAGNOSIS — F0392 Unspecified dementia, unspecified severity, with psychotic disturbance: Secondary | ICD-10-CM

## 2019-06-19 DIAGNOSIS — N183 Chronic kidney disease, stage 3 unspecified: Secondary | ICD-10-CM

## 2019-06-19 DIAGNOSIS — Z794 Long term (current) use of insulin: Secondary | ICD-10-CM

## 2019-06-19 DIAGNOSIS — I10 Essential (primary) hypertension: Secondary | ICD-10-CM

## 2019-06-19 DIAGNOSIS — I5032 Chronic diastolic (congestive) heart failure: Secondary | ICD-10-CM

## 2019-06-19 LAB — CBC WITH DIFFERENTIAL/PLATELET
Basophils Absolute: 0 10*3/uL (ref 0.0–0.1)
Basophils Relative: 0.3 % (ref 0.0–3.0)
Eosinophils Absolute: 0.3 10*3/uL (ref 0.0–0.7)
Eosinophils Relative: 5.1 % — ABNORMAL HIGH (ref 0.0–5.0)
HCT: 36.2 % (ref 36.0–46.0)
Hemoglobin: 12.1 g/dL (ref 12.0–15.0)
Lymphocytes Relative: 30 % (ref 12.0–46.0)
Lymphs Abs: 1.7 10*3/uL (ref 0.7–4.0)
MCHC: 33.4 g/dL (ref 30.0–36.0)
MCV: 88.4 fl (ref 78.0–100.0)
Monocytes Absolute: 0.6 10*3/uL (ref 0.1–1.0)
Monocytes Relative: 10.1 % (ref 3.0–12.0)
Neutro Abs: 3 10*3/uL (ref 1.4–7.7)
Neutrophils Relative %: 54.5 % (ref 43.0–77.0)
Platelets: 142 10*3/uL — ABNORMAL LOW (ref 150.0–400.0)
RBC: 4.09 Mil/uL (ref 3.87–5.11)
RDW: 15.7 % — ABNORMAL HIGH (ref 11.5–15.5)
WBC: 5.5 10*3/uL (ref 4.0–10.5)

## 2019-06-19 LAB — HEPATIC FUNCTION PANEL
ALT: 11 U/L (ref 0–35)
AST: 14 U/L (ref 0–37)
Albumin: 3.7 g/dL (ref 3.5–5.2)
Alkaline Phosphatase: 84 U/L (ref 39–117)
Bilirubin, Direct: 0.1 mg/dL (ref 0.0–0.3)
Total Bilirubin: 0.4 mg/dL (ref 0.2–1.2)
Total Protein: 7 g/dL (ref 6.0–8.3)

## 2019-06-19 LAB — LIPID PANEL
Cholesterol: 147 mg/dL (ref 0–200)
HDL: 48.2 mg/dL (ref >39.00)
LDL Cholesterol: 76 mg/dL (ref 0–99)
NonHDL: 98.94
Total CHOL/HDL Ratio: 3
Triglycerides: 113 mg/dL (ref 0.0–149.0)
VLDL: 22.6 mg/dL (ref 0.0–40.0)

## 2019-06-19 LAB — BASIC METABOLIC PANEL
BUN: 29 mg/dL — ABNORMAL HIGH (ref 6–23)
CO2: 28 mEq/L (ref 19–32)
Calcium: 9.9 mg/dL (ref 8.4–10.5)
Chloride: 104 mEq/L (ref 96–112)
Creatinine, Ser: 0.93 mg/dL (ref 0.40–1.20)
GFR: 70.46 mL/min (ref >60.00)
Glucose, Bld: 173 mg/dL — ABNORMAL HIGH (ref 70–99)
Potassium: 4.1 mEq/L (ref 3.5–5.1)
Sodium: 139 mEq/L (ref 135–145)

## 2019-06-19 LAB — HEMOGLOBIN A1C: Hgb A1c MFr Bld: 14.5 % — ABNORMAL HIGH (ref 4.6–6.5)

## 2019-06-19 MED ORDER — INSULIN LISPRO (1 UNIT DIAL) 100 UNIT/ML (KWIKPEN): PEN_INJECTOR | SUBCUTANEOUS | 5 refills | Status: DC

## 2019-06-19 NOTE — Assessment & Plan Note (Signed)
stable overall by history and exam, recent data reviewed with pt, and pt to continue medical treatment as before,  to f/u any worsening symptoms or concerns  

## 2019-06-19 NOTE — Progress Notes (Addendum)
Subjective:    Patient ID: Holly Weiss, female    DOB: 1941-04-30, 78 y.o.   MRN: [id number]  HPI  Here to f/u with family; overall doing ok,  Pt denies chest pain, increasing sob or doe, wheezing, orthopnea, PND, increased LE swelling, palpitations, dizziness or syncope.  Pt denies new neurological symptoms such as new headache, or facial or extremity weakness or numbness.  Pt denies polydipsia, polyuria, or low sugar episode.  Pt states overall good compliance with meds, mostly trying to follow appropriate diet, with wt overall down, Some lower appetite recenlty, few lbs wt loss.  No new complaints  Dementia overall stable symptomatically  and not assoc with behavioral changes such as hallucinations, paranoia, or agitation.  Pt needs wheelchair - unable to safely ambulate without assistance due to risk of fall, and cane and walker will not be adequate with her history of dementia, CHF and chronic pain Wt Readings from Last 3 Encounters:  06/19/19 208 lb 9.6 oz (94.6 kg)  11/02/18 214 lb (97.1 kg)  09/24/16 214 lb (97.1 kg)   Past Medical History:  Diagnosis Date  . ANEMIA-NOS 01/18/2008  . Arthritis   . 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 ASTHMA, WITH ACUTE EXACERBATION 11/29/2008  . Bilateral carpal tunnel syndrome   . Chronic pain syndrome 01/21/2010  . Depression   . DIABETIC  RETINOPATHY 05/30/2007  . DM, UNCOMPLICATED, TYPE II, UNCONTROLLED 05/30/2007  . Edema of both legs    takes Lasix  . Femur fracture, right ([place])   . GERD (gastroesophageal reflux disease)   . 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 History of blood transfusion    spine surgery  . History of kidney stones   . HYPERLIPIDEMIA 05/30/2007  . HYPERTENSION 10/31/2007  . LOW BACK PAIN 01/18/2008  . Migraine headache   . MORBID OBESITY, HX OF 05/30/2007  . NEPHROLITHIASIS, HX OF 01/18/2008  . Numbness and tingling in hands   . Seasonal allergies   . Shortness of breath    with walking  . SHOULDER PAIN, BILATERAL 04/09/2009   . Spinal stenosis   . Urgency of urination    Past Surgical History:  Procedure Laterality Date  . BACK SURGERY    . CATARACT EXTRACTION    . CHOLECYSTECTOMY    . lumbar disc surgury    . ORIF TIBIA FRACTURE Right 01/16/2014   Procedure: RIGHT TIBIA REPAIR NON-UNION/MALUNION TIBIA WITH SLIDING GRAFT;  Surgeon: Renette Butters, MD;  Location: [facility];  Service: Orthopedics;  Laterality: Right;  . THORACIC FUSION    . TOTAL KNEE ARTHROPLASTY WITH REVISION COMPONENTS Right 07/12/2013   Procedure: TOTAL KNEE ARTHROPLASTY WITH TIBIA REVISION COMPONENTS;  Surgeon: Ninetta Lights, MD;  Location: [facility];  Service: Orthopedics;  Laterality: Right;  . TUBAL LIGATION      reports that she has never smoked. She uses smokeless tobacco. She reports that she does not drink alcohol or use drugs. family history includes Dementia in her mother; Depression in her sister; Diabetes in her mother and sister; Heart disease in her father and mother; Lung cancer in her father. Allergies  Allergen Reactions  . Adhesive [Tape] Itching  . Endocet [Oxycodone-Acetaminophen] Itching  . Latex Itching  . Oxycodone-Acetaminophen Itching   Current Outpatient Medications on File Prior to Visit  Medication Sig Dispense Refill  . albuterol (VENTOLIN HFA) 108 (90 Base) MCG/ACT inhaler INHALE 2 PUFFS INTO THE LUNGS TWICE DAILY AS NEEDED FOR WHEEZING OR SHORTNESS OF BREATH 18 g 3  .  aspirin 81 MG tablet Take 1 tablet (81 mg total) by mouth daily. 30 tablet 0  . B-D ULTRAFINE III SHORT PEN 31G X 8 MM MISC USE TO CHECK BLOOD SUGERS TWICE DAILY 100 each 5  . Blood Glucose Monitoring Suppl ([place]) w/Device KIT Use as directed once daily to check blood sugar.  Diagnosis code 250.02 1 each 0  . Calcium Carbonate-Vitamin D (CALTRATE 600+D PO) Take 2 tablets by mouth daily.    . Continuous Blood Gluc Receiver (FREESTYLE LIBRE 14 DAY READER) DEVI Apply 1 Device topically daily. Use as directed daily E11.9 1 Device 0   . Continuous Blood Gluc Sensor (FREESTYLE LIBRE 14 DAY SENSOR) MISC Apply 1 Device topically every 14 (fourteen) days. E11.9 6 each 3  . diclofenac sodium (VOLTAREN) 1 % GEL Apply 2 g topically 4 (four) times daily. To affected joint. 100 g 11  . donepezil (ARICEPT) 5 MG tablet TAKE 1 TABLET BY MOUTH EVERY NIGHT AT BEDTIME    . furosemide (LASIX) 40 MG tablet 1 tab by mouth once daily 90 tablet 3  . glucose blood (ONE TOUCH TEST STRIPS) test strip Use as directed once daily to check blood sugar.  Diagnosis code E11.9 100 each 11  . Insulin Glargine (LANTUS SOLOSTAR) 100 UNIT/ML Solostar Pen ADMINISTER 90 UNITS UNDER THE SKIN DAILY 15 mL 5  . Insulin Pen Needle (B-D ULTRAFINE III SHORT PEN) 31G X 8 MM MISC USE TO CHECK BLOOD SUGERS TWICE DAILY 100 each 3  . Magnesium 250 MG TABS Take 1 tablet (250 mg total) by mouth daily. 90 tablet 0  . metoprolol succinate (TOPROL-XL) 100 MG 24 hr tablet TAKE 1 TABLET BY MOUTH EVERY DAY WITH OR IMMEDIATELY FOLLOWING A MEAL 90 tablet 3  . Multiple Vitamins-Minerals (ONE-A-DAY EXTRAS ANTIOXIDANT PO) Take 1 tablet by mouth daily.     . ONE TOUCH LANCETS MISC Use as directed once daily to check blood sugar. DX E11.09 100 each 3  . potassium chloride (K-DUR) 10 MEQ tablet TAKE 1 TABLET BY MOUTH DAILY,(*ONLY ON DAY THE LASIX IS TAKEN*) 180 tablet 3  . QUEtiapine (SEROQUEL) 100 MG tablet Take 1 tablet (100 mg total) by mouth 2 (two) times daily. 180 tablet 3  . rosuvastatin (CRESTOR) 20 MG tablet TAKE 1/2 TABLET BY MOUTH EVERY DAY 90 tablet 0  . tiZANidine (ZANAFLEX) 2 MG tablet Take 1 tablet (2 mg total) by mouth every 6 (six) hours as needed for muscle spasms. 40 tablet 1  . traMADol (ULTRAM) 50 MG tablet Take 1 tablet (50 mg total) by mouth every 6 (six) hours as needed. 30 tablet 0  . triamcinolone (NASACORT) 55 MCG/ACT AERO nasal inhaler Place 2 sprays into the nose daily. 1 Inhaler 12   No current facility-administered medications on file prior to visit.     Review of Systems  Constitutional: Negative for other unusual diaphoresis or sweats HENT: Negative for ear discharge or swelling Eyes: Negative for other worsening visual disturbances Respiratory: Negative for stridor or other swelling  Gastrointestinal: Negative for worsening distension or other blood Genitourinary: Negative for retention or other urinary change Musculoskeletal: Negative for other MSK pain or swelling Skin: Negative for color change or other new lesions Neurological: Negative for worsening tremors and other numbness  Psychiatric/Behavioral: Negative for worsening agitation or other fatigue All otherwise neg per pt    Objective:   Physical Exam BP 128/70   Pulse 72   Temp 98.7 F (37.1 C) (Oral)  Wt 208 lb 9.6 oz (94.6 kg)   SpO2 94%   BMI 39.41 kg/m  VS noted,  Constitutional: Pt appears in NAD HENT: Head: NCAT.  Right Ear: External ear normal.  Left Ear: External ear normal.  Eyes: . Pupils are equal, round, and reactive to light. Conjunctivae and EOM are normal Nose: without d/c or deformity Neck: Neck supple. Gross normal ROM Cardiovascular: Normal rate and regular rhythm.   Pulmonary/Chest: Effort normal and breath sounds without rales or wheezing.  Abd:  Soft, NT, ND, + BS, no organomegaly Neurological: Pt is alert. At baseline orientation, motor grossly intact Skin: Skin is warm. No rashes, other new lesions, no LE edema Psychiatric: Pt behavior is normal without agitation  All otherwise neg per pt  Lab Results  Component Value Date   WBC 5.2 11/02/2018   HGB 11.7 (L) 11/02/2018   HCT 35.0 (L) 11/02/2018   PLT 167.0 11/02/2018   GLUCOSE 166 (H) 11/02/2018   CHOL 181 11/02/2018   TRIG 79.0 11/02/2018   HDL 48.80 11/02/2018   LDLCALC 116 (H) 11/02/2018   ALT 16 11/02/2018   AST 18 11/02/2018   NA 138 11/02/2018   K 4.8 11/02/2018   CL 104 11/02/2018   CREATININE 1.14 11/02/2018   BUN 39 (H) 11/02/2018   CO2 26 11/02/2018   TSH 3.52  11/02/2018   INR 1.11 01/15/2014   HGBA1C 11.3 (A) 02/14/2019   MICROALBUR 4.6 (H) 11/02/2018      Assessment & Plan:

## 2019-06-19 NOTE — Patient Instructions (Addendum)

## 2019-06-19 NOTE — Assessment & Plan Note (Addendum)
stable overall by history and exam, recent data reviewed with pt, and pt to continue medical treatment as before,  to f/u any worsening symptoms or concerns  Note:  Total time for pt hx, exam, review of record with pt in the room, determination of diagnoses and plan for further eval and tx is > 40 min, with over 50% spent in coordination and counseling of patient including the differential dx, tx, further evaluation and other management of dementia, diaast chf, HTN, DM, CKD, asthma

## 2019-07-18 ENCOUNTER — Other Ambulatory Visit: Payer: Self-pay | Admitting: *Deleted

## 2019-07-18 MED ORDER — TIZANIDINE HCL 2 MG PO TABS: 2.0000 mg | ORAL_TABLET | Freq: Four times a day (QID) | ORAL | 0 refills | Status: DC | PRN

## 2019-08-10 ENCOUNTER — Other Ambulatory Visit: Payer: Self-pay | Admitting: *Deleted

## 2019-08-10 DIAGNOSIS — E119 Type 2 diabetes mellitus without complications: Secondary | ICD-10-CM

## 2019-08-10 MED ORDER — LANTUS SOLOSTAR 100 UNIT/ML ~~LOC~~ SOPN: PEN_INJECTOR | SUBCUTANEOUS | 5 refills | Status: DC

## 2019-08-11 ENCOUNTER — Encounter: Payer: Self-pay | Admitting: Endocrinology

## 2019-08-11 ENCOUNTER — Other Ambulatory Visit: Payer: Self-pay | Admitting: *Deleted

## 2019-08-11 DIAGNOSIS — E785 Hyperlipidemia, unspecified: Secondary | ICD-10-CM

## 2019-08-11 MED ORDER — ROSUVASTATIN CALCIUM 20 MG PO TABS: ORAL_TABLET | ORAL | 0 refills | Status: DC

## 2019-08-14 ENCOUNTER — Other Ambulatory Visit: Payer: Self-pay

## 2019-08-14 ENCOUNTER — Other Ambulatory Visit: Payer: Self-pay | Admitting: Internal Medicine

## 2019-08-14 MED ORDER — TIZANIDINE HCL 2 MG PO TABS: 2.0000 mg | ORAL_TABLET | Freq: Four times a day (QID) | ORAL | 0 refills | Status: DC | PRN

## 2019-08-26 ENCOUNTER — Encounter: Payer: Self-pay | Admitting: Internal Medicine

## 2019-08-28 NOTE — Telephone Encounter (Signed)
Ok to check to see if she is followed by [facility]  I can refer if needed

## 2019-08-29 ENCOUNTER — Other Ambulatory Visit: Payer: Self-pay | Admitting: *Deleted

## 2019-08-29 NOTE — Patient Outreach (Signed)
[facility] [facility]) Care Management  08/29/2019  Holly Weiss April 30, 1941 [id number]   Referral received 1/26 Initial outreach 1/26  Telephone Assessment-Enrolled (Fall Prevention)  RN spoke with pt's daughter the primary caregiver Holly Weiss [phone]) concerning possible needs for this pt. Caregiver indicates she wishes to move pt to her home address in [place] and will need some equipment and possible resources. The following was addressed:  -Medication monitoring- RN informed daughter [facility] is not equipped with provided direct services however other options for system devices are available. RN offered to make a referral to [place] to further address (receptive). -Requested DME- Daughter states she needs hospital bed and wheelchair. RN requested daughter to contact the pt's provider and requested prescriptions for the agency of choice that would be able to provide such equipment in her area for [facility] that provide the requested DME. Daughter indicated she would contact the provided with this request. Daughter aware [facility] does not provide pt's equipment. States pt has a 3-1 commode.  -Fall prevention-RN discussed fall prevention measures and offered to send printed educational information and enroll pt into the [facility] program for closer monitoring on safety measures to prevent the risk of falls for this pt. Again Daughter aware [facility] is not providing home visits however if pt is deconditioning due to lack of mobility to contact pt's provider once again and make a request for possible PT/OT in the home once pt established at the new address. Caregiver receptive to enrollment and information packet that will be send requesting all information be sent to her address.  States pt has a history of a stroke with some residual and weakness causing several falls over the years. States pt has a shuffled walk and weak when ambulating. Daughter states she works from home and can better  monitor pt if she moves into her home in [place]. This is currently the plan to assist pt with more assistance.  RN generated a plan of care and discussed all goals and interventions that can assist. No further questions or inquires at this time. Will update provider's office on pt's disposition with [facility] services and will send Welcome and Successful letter to pt's daughter's address as requested along with the [facility] packet. Will follow up on few weeks to completed the initial assessment.   [facility] CM Care Plan Problem One     Most Recent Value  Care Plan Problem One  Fall prevention related to weakness and history of falls  Role Documenting the Problem One  Care Management Telephonic [place] for Problem One  Active  [facility] Long Term Goal   Pt will demonstrate use of safety measures to prevent falls within the next 90 days.  [facility] Long Term Goal Start Date  08/29/19  Interventions for Problem One Long Term Goal  Will provide needed resources ato assist with DME and encouraged use of safety measures with well lite areas to prevent acute falls and/or inujuries.    [facility] CM Care Plan Problem Two     Most Recent Value  Care Plan Problem Two  Pharmacy needs related to medication monitoring  Role Documenting the Problem Two  Care Management Telephonic [place] for Problem Two  Active  [facility] CM Short Term Goal #1   Caregiver will be receptive to [facility] referral for pharmacy within the next 30 days  [facility] CM Short Term Goal #1 Start Date  08/29/19  Interventions for Short Term Goal #2   Will  make a referral to [place] to assist with medication monitoring    [facility] CM Care Plan Problem Three     Most Recent Value  Care Plan Problem Three  Requested DME r/t safety in the home  Role Documenting the Problem Three  Care Management Telephonic [place] for Problem Three  Active  [facility] CM Short Term Goal #1   Caregiver will contact pt's provider for DME orders within the next 3  weeks.  [facility] CM Short Term Goal #1 Start Date  08/29/19  Interventions for Short Term Goal #1  Discussed the process if the provider's office is involved with the need for a prescription for the requested durable medical equipment (DME) will be needed. Daughter aware to contact the MD office directly with her request and agency of choice. If issues caregiver aware to contact RN case manager for further assistance.     Raina Mina, RN Care Management Coordinator [place] Office [phone]

## 2019-08-30 ENCOUNTER — Other Ambulatory Visit: Payer: Self-pay | Admitting: Pharmacist

## 2019-08-30 NOTE — Patient Outreach (Signed)
[facility]) [place]   08/30/2019  Holly Weiss 28-May-1941 [id number]  Reason for referral: Medication Management  -Per notes, patient's daughter would like to discuss medication monitoring, daughter planning on moving patient to live with her in [place]  Referral source: [facility] RN Current insurance: [facility]  PMHx includes but not limited to:  T2DM, HTN, HLD, chronic pain syndrome, CHF, migraines, morbid obesity, depression, dementia, CKD-III  Outreach:  Successful telephone call with patient's daughter.  HIPAA identifiers verified.   Subjective:  Daughter reports that patient currently taking medications from bottle and is not using any form of pillbox or compliance packaging.  Family has not tried to organize medications for patient but daughter believes patient may be forgetting to take some of her medications.  She reports patient rarely checks her blood sugars and is interested in possible use of CGM.  She is hoping have patient move in with her soon to be able to assist more with medications management.     Daughter unable to review medications today as she is not with patient and does not have medication list with her.  She requests that I call her back tomorrow afternoon to do this.    We reviewed several possible strategies to manage medications including use of compliance packs, pill boxes, alarms, and calendars.  Daughter will discuss these with patient to gauge interest.  Patient does not have great dexterity with hands therefore compliance packs may not be best option.  [facility] can send a pillbox to daughter if needed.      Plan: Will contact patient and daughter tomorrow afternoon for medication review  Ralene Bathe, PharmD, [place] [phone]

## 2019-08-31 ENCOUNTER — Ambulatory Visit: Payer: Self-pay | Admitting: Pharmacist

## 2019-08-31 ENCOUNTER — Other Ambulatory Visit: Payer: Self-pay | Admitting: Pharmacist

## 2019-08-31 NOTE — Patient Outreach (Addendum)
[place] [facility]) Care Management  [place]   08/31/2019  Holly Weiss 01/20/1941 [id number]  Reason for referral: Medication Management  -Per notes, patient's daughter would like to discuss medication monitoring, daughter planning on moving patient to live with her in [place]  Referral source: [facility] RN Current insurance: [facility]  PMHx includes but not limited to:  T2DM, HTN, HLD, chronic pain syndrome, CHF, migraines, morbid obesity, depression, dementia, CKD-III  Outreach:  Unsuccessful telephone call attempt #2 to patient's family for medication review.   -Spoke with Holly Weiss who is now driving, unable to discuss medications.  She provided me with her sister's phone number Holly Weiss [phone]) and requested I try to reach her instead.  Holly Weiss has no further questions on medication monitoring at this time.  She states she will save me contact information if she needs to reach out to me in the future.  -Unsuccessful call to sister, Holly Weiss, also - left HIPAA compliant VM.    Plan:  -I will make another outreach attempt to patient within 3-4 business days.   Ralene Bathe, PharmD, [place] [phone]   Addendum: Incoming call from [place], patient's daughter.  Holly Weiss denies having any specific medication concerns at this time.  She is no longer at home with patient and cannot review medications.  She reports she will contact me if questions arise or she has any concerns.    Plan: Will close [place] case as no further medication questions from family at this time.  Am happy to assist in the future as needed.   Ralene Bathe, PharmD, [place] [phone]

## 2019-09-01 ENCOUNTER — Other Ambulatory Visit: Payer: Self-pay

## 2019-09-01 MED ORDER — TIZANIDINE HCL 2 MG PO TABS: 2.0000 mg | ORAL_TABLET | Freq: Four times a day (QID) | ORAL | 0 refills | Status: DC | PRN

## 2019-09-05 ENCOUNTER — Ambulatory Visit: Payer: Self-pay | Admitting: Pharmacist

## 2019-09-11 ENCOUNTER — Other Ambulatory Visit: Payer: Self-pay | Admitting: *Deleted

## 2019-09-11 NOTE — Patient Outreach (Signed)
[facility]) Care Management  09/11/2019  KIELEY WEHRLI 1941-06-18 [id number]    Telephone Assessment  RN spoke with pt's daughter today [name]) who requested a call back to complete the initial assessment on Friday @ 2:00 PM.   Plan: Will rescheduled another call back on this Friday as requested to complete the assessment.  Raina Mina, RN Care Management Coordinator [place] Office [phone]

## 2019-09-15 ENCOUNTER — Encounter: Payer: Self-pay | Admitting: *Deleted

## 2019-09-15 ENCOUNTER — Other Ambulatory Visit: Payer: Self-pay | Admitting: *Deleted

## 2019-09-15 NOTE — Patient Outreach (Signed)
[name] [facility]) Care Management  09/15/2019  Holly Weiss 06-Sep-1940 [id number]   Telephone Assessment-Initial Assessment  RN followed up with pt's caregiver daughter today [name]) to obtain the initial assessment however daughter is unaware of pt's medication and requested a call back next Tuesday when when will be at the pt's home to verify all medications.  Daughter reports her brother is the usually caregiver in the home and lives with the pt however he is current in the hospital and pt is admant about staying in the home alone. *Daughter in [place] and wants pt to move in with her however pt adamantly refuses. State between her siblings they are taking turns caring for the pt with regular home visits checking on pt. During the assessment several things were address concerning the care of pt. Discussed emergency services with an agency for immediate access to EMS such as [facility] or [facility], ect to assist with any falls or emergencies while in the home.   *Daughter states the pt refuses takes a bath-RN stressed the risk of infections, UTI and possible bed begs generating as a result of poor hygiene. *Pt non-adherent with taking her BS to monitor her diabetes. States her brother was randomly perform BS with his supplies due to unable to find the pt's supplies. Daughter could not verify what medications pt was taking and requested a call back on next Tuesday when she would be at the home to inquire further. States there are some medications the pt is not taking but not sure which ones. Medication monitoring has been an issues. Note [facility] pharmacy will be providing a dispensing monitoring to assist.  Several resources discussed along with levels of care from assistive to long term care if appropriate. Based upon the multiple problems and issues occurring with this pt RN further discuss Remote Health and possible Care Connection due to her chronic issues. RN strongly  encouraged caregiver to consider due to the risk with all the medical issues this pt has related to CAD,Kidney, DM and living alone with a fall history. Daughter has agreed to a referral to [place].   RN spoke with [name] with [place] with the referral and provided the information as needed. Contact information provided to the daughter for all arrangements and home visits. Representative indicated they would contact Dr. Jenny Reichmann and family for acknowledgement.   RN will follow up next Tuesday as requested to complete the medication review to complete the initial assessment.

## 2019-09-18 ENCOUNTER — Telehealth: Payer: Self-pay

## 2019-09-18 DIAGNOSIS — F0392 Unspecified dementia, unspecified severity, with psychotic disturbance: Secondary | ICD-10-CM

## 2019-09-18 DIAGNOSIS — F0391 Unspecified dementia with behavioral disturbance: Secondary | ICD-10-CM

## 2019-09-18 DIAGNOSIS — I5032 Chronic diastolic (congestive) heart failure: Secondary | ICD-10-CM

## 2019-09-18 DIAGNOSIS — M170 Bilateral primary osteoarthritis of knee: Secondary | ICD-10-CM

## 2019-09-18 NOTE — Telephone Encounter (Signed)
Ok for verbal 

## 2019-09-18 NOTE — Telephone Encounter (Signed)
Holly Weiss at [facility] received referral from [facility] for immediate in home nurse services for palliative care due to Cognitive limitations, noncompliance with meds. Call with VERBAL ORDER [phone] please.

## 2019-09-18 NOTE — Telephone Encounter (Signed)
New message   Hospice calling  order to start Palliative care.

## 2019-09-19 ENCOUNTER — Other Ambulatory Visit: Payer: Self-pay | Admitting: *Deleted

## 2019-09-19 ENCOUNTER — Encounter: Payer: Self-pay | Admitting: *Deleted

## 2019-09-19 NOTE — Telephone Encounter (Signed)
We can enter the order in Epic and call with the verbal as well.

## 2019-09-19 NOTE — Telephone Encounter (Signed)
Palliative care ordered, [facility] contacted verbal for palliative care given.   Called [place] and lvm to notify her about the order for palliative care for pt.

## 2019-09-19 NOTE — Patient Outreach (Addendum)
[facility]) Care Management  09/19/2019  Holly Weiss Aug 15, 1940 [id number]   Telephone Assessment-Medication review to completed the initial assessment  RN contacted pt's daughter who was suppose to be visiting the pt today however daughter as indicated due to the incremented weather pt is currently living with her in [place]. RN able to speak with the pt today and review all medication in detail on medication she is taking and those medication she is not taking. Daughter was present as Therapist, sports encouraged her to contact the pharmacy and/or the pt's provider if pharmacy unable to fill or contact the provider. Also offered [facility] pill boxes for better organization on daily administration of pt's medications. Daughter receptive to the 2 large pill boxes for pt's medications. Will request educational letter and with 2 pillbox to be sent the the pt's daughter's home.   Daughter remains aware to contact this RN case manager with any request or needed resources in order to continue managing the pt's care. No other inquires at this time. Will follow up in a few weeks as discussed concerning pt's ongoing progress.  Raina Mina, RN Care Management Coordinator [place] Office [phone]

## 2019-09-20 ENCOUNTER — Telehealth: Payer: Self-pay | Admitting: Internal Medicine

## 2019-09-20 NOTE — Telephone Encounter (Signed)
Phone call placed to patient to offer to schedule a visit with [facility]. Phone rang, with no answer and voicemail box was not set up.

## 2019-09-22 ENCOUNTER — Telehealth: Payer: Self-pay | Admitting: Internal Medicine

## 2019-09-22 NOTE — Telephone Encounter (Signed)
Spoke with patient's daughter [name] regarding Palliative services and she was in agreement with this.  I have scheduled a Zoom Consult for 09/28/19 @ 2:15 PM.

## 2019-09-23 ENCOUNTER — Ambulatory Visit: Payer: Medicare Other | Attending: Internal Medicine

## 2019-09-23 DIAGNOSIS — Z23 Encounter for immunization: Secondary | ICD-10-CM | POA: Insufficient documentation

## 2019-09-23 NOTE — Progress Notes (Signed)
   Covid-19 Vaccination Clinic  Name:  Holly Weiss    MRN: [id number] DOB: [birth year]  09/23/2019  [name] was observed post Covid-19 immunization for 15 minutes without incidence. She was provided with Vaccine Information Sheet and instruction to access the V-Safe system.   [name] was instructed to call 911 with any severe reactions post vaccine: Marland Kitchen Difficulty breathing  . Swelling of your face and throat  . A fast heartbeat  . A bad rash all over your body  . Dizziness and weakness    Immunizations Administered    Name Date Dose VIS Date Route   Pfizer COVID-19 Vaccine 09/23/2019  1:10 PM 0.3 mL 07/14/2019 Intramuscular   Manufacturer: [place]   Lot: [id number]   [place]: [id number]

## 2019-09-26 ENCOUNTER — Telehealth: Payer: Self-pay | Admitting: Internal Medicine

## 2019-09-26 ENCOUNTER — Other Ambulatory Visit: Payer: Self-pay

## 2019-09-26 ENCOUNTER — Encounter (HOSPITAL_COMMUNITY): Payer: Self-pay

## 2019-09-26 DIAGNOSIS — E860 Dehydration: Secondary | ICD-10-CM | POA: Diagnosis present

## 2019-09-26 DIAGNOSIS — Z794 Long term (current) use of insulin: Secondary | ICD-10-CM | POA: Diagnosis not present

## 2019-09-26 DIAGNOSIS — F329 Major depressive disorder, single episode, unspecified: Secondary | ICD-10-CM | POA: Diagnosis present

## 2019-09-26 DIAGNOSIS — R0602 Shortness of breath: Secondary | ICD-10-CM | POA: Diagnosis not present

## 2019-09-26 DIAGNOSIS — R296 Repeated falls: Secondary | ICD-10-CM | POA: Diagnosis present

## 2019-09-26 DIAGNOSIS — E11319 Type 2 diabetes mellitus with unspecified diabetic retinopathy without macular edema: Secondary | ICD-10-CM | POA: Diagnosis present

## 2019-09-26 DIAGNOSIS — M6281 Muscle weakness (generalized): Secondary | ICD-10-CM | POA: Diagnosis present

## 2019-09-26 DIAGNOSIS — Z515 Encounter for palliative care: Secondary | ICD-10-CM | POA: Diagnosis not present

## 2019-09-26 DIAGNOSIS — M4316 Spondylolisthesis, lumbar region: Secondary | ICD-10-CM | POA: Diagnosis not present

## 2019-09-26 DIAGNOSIS — I5032 Chronic diastolic (congestive) heart failure: Secondary | ICD-10-CM | POA: Diagnosis present

## 2019-09-26 DIAGNOSIS — E1122 Type 2 diabetes mellitus with diabetic chronic kidney disease: Secondary | ICD-10-CM | POA: Diagnosis not present

## 2019-09-26 DIAGNOSIS — M47812 Spondylosis without myelopathy or radiculopathy, cervical region: Secondary | ICD-10-CM | POA: Diagnosis present

## 2019-09-26 DIAGNOSIS — S161XXA Strain of muscle, fascia and tendon at neck level, initial encounter: Secondary | ICD-10-CM | POA: Diagnosis not present

## 2019-09-26 DIAGNOSIS — M542 Cervicalgia: Secondary | ICD-10-CM | POA: Diagnosis not present

## 2019-09-26 DIAGNOSIS — S060X9A Concussion with loss of consciousness of unspecified duration, initial encounter: Secondary | ICD-10-CM | POA: Diagnosis not present

## 2019-09-26 DIAGNOSIS — W06XXXA Fall from bed, initial encounter: Secondary | ICD-10-CM | POA: Diagnosis present

## 2019-09-26 DIAGNOSIS — M4802 Spinal stenosis, cervical region: Secondary | ICD-10-CM | POA: Diagnosis present

## 2019-09-26 DIAGNOSIS — Z981 Arthrodesis status: Secondary | ICD-10-CM

## 2019-09-26 DIAGNOSIS — S92351A Displaced fracture of fifth metatarsal bone, right foot, initial encounter for closed fracture: Secondary | ICD-10-CM | POA: Diagnosis present

## 2019-09-26 DIAGNOSIS — W19XXXA Unspecified fall, initial encounter: Secondary | ICD-10-CM | POA: Diagnosis present

## 2019-09-26 DIAGNOSIS — Z801 Family history of malignant neoplasm of trachea, bronchus and lung: Secondary | ICD-10-CM

## 2019-09-26 DIAGNOSIS — Z20822 Contact with and (suspected) exposure to covid-19: Secondary | ICD-10-CM | POA: Diagnosis present

## 2019-09-26 DIAGNOSIS — R531 Weakness: Secondary | ICD-10-CM | POA: Diagnosis not present

## 2019-09-26 DIAGNOSIS — Y92003 Bedroom of unspecified non-institutional (private) residence as the place of occurrence of the external cause: Secondary | ICD-10-CM

## 2019-09-26 DIAGNOSIS — E1165 Type 2 diabetes mellitus with hyperglycemia: Secondary | ICD-10-CM | POA: Diagnosis present

## 2019-09-26 DIAGNOSIS — S8991XA Unspecified injury of right lower leg, initial encounter: Secondary | ICD-10-CM | POA: Diagnosis not present

## 2019-09-26 DIAGNOSIS — J9811 Atelectasis: Secondary | ICD-10-CM | POA: Diagnosis not present

## 2019-09-26 DIAGNOSIS — F0390 Unspecified dementia without behavioral disturbance: Secondary | ICD-10-CM | POA: Diagnosis present

## 2019-09-26 DIAGNOSIS — I130 Hypertensive heart and chronic kidney disease with heart failure and stage 1 through stage 4 chronic kidney disease, or unspecified chronic kidney disease: Secondary | ICD-10-CM | POA: Diagnosis not present

## 2019-09-26 DIAGNOSIS — G894 Chronic pain syndrome: Secondary | ICD-10-CM | POA: Diagnosis present

## 2019-09-26 DIAGNOSIS — M4804 Spinal stenosis, thoracic region: Secondary | ICD-10-CM | POA: Diagnosis present

## 2019-09-26 DIAGNOSIS — S14129A Central cord syndrome at unspecified level of cervical spinal cord, initial encounter: Principal | ICD-10-CM | POA: Diagnosis present

## 2019-09-26 DIAGNOSIS — J4520 Mild intermittent asthma, uncomplicated: Secondary | ICD-10-CM | POA: Diagnosis not present

## 2019-09-26 DIAGNOSIS — M48061 Spinal stenosis, lumbar region without neurogenic claudication: Secondary | ICD-10-CM | POA: Diagnosis present

## 2019-09-26 DIAGNOSIS — E785 Hyperlipidemia, unspecified: Secondary | ICD-10-CM | POA: Diagnosis present

## 2019-09-26 DIAGNOSIS — Z885 Allergy status to narcotic agent status: Secondary | ICD-10-CM

## 2019-09-26 DIAGNOSIS — N179 Acute kidney failure, unspecified: Secondary | ICD-10-CM | POA: Diagnosis not present

## 2019-09-26 DIAGNOSIS — F17220 Nicotine dependence, chewing tobacco, uncomplicated: Secondary | ICD-10-CM | POA: Diagnosis present

## 2019-09-26 DIAGNOSIS — Z96641 Presence of right artificial hip joint: Secondary | ICD-10-CM | POA: Diagnosis present

## 2019-09-26 DIAGNOSIS — K219 Gastro-esophageal reflux disease without esophagitis: Secondary | ICD-10-CM | POA: Diagnosis present

## 2019-09-26 DIAGNOSIS — H409 Unspecified glaucoma: Secondary | ICD-10-CM | POA: Diagnosis present

## 2019-09-26 DIAGNOSIS — J45909 Unspecified asthma, uncomplicated: Secondary | ICD-10-CM | POA: Diagnosis present

## 2019-09-26 DIAGNOSIS — S76011A Strain of muscle, fascia and tendon of right hip, initial encounter: Secondary | ICD-10-CM | POA: Diagnosis not present

## 2019-09-26 DIAGNOSIS — Z7982 Long term (current) use of aspirin: Secondary | ICD-10-CM

## 2019-09-26 DIAGNOSIS — I10 Essential (primary) hypertension: Secondary | ICD-10-CM | POA: Diagnosis not present

## 2019-09-26 DIAGNOSIS — Z8249 Family history of ischemic heart disease and other diseases of the circulatory system: Secondary | ICD-10-CM

## 2019-09-26 DIAGNOSIS — S76019A Strain of muscle, fascia and tendon of unspecified hip, initial encounter: Secondary | ICD-10-CM | POA: Diagnosis present

## 2019-09-26 DIAGNOSIS — Z9181 History of falling: Secondary | ICD-10-CM

## 2019-09-26 DIAGNOSIS — R42 Dizziness and giddiness: Secondary | ICD-10-CM | POA: Diagnosis not present

## 2019-09-26 DIAGNOSIS — N1831 Chronic kidney disease, stage 3a: Secondary | ICD-10-CM | POA: Diagnosis present

## 2019-09-26 DIAGNOSIS — Z79899 Other long term (current) drug therapy: Secondary | ICD-10-CM

## 2019-09-26 DIAGNOSIS — Z87442 Personal history of urinary calculi: Secondary | ICD-10-CM

## 2019-09-26 DIAGNOSIS — S79911A Unspecified injury of right hip, initial encounter: Secondary | ICD-10-CM | POA: Diagnosis not present

## 2019-09-26 DIAGNOSIS — Z91040 Latex allergy status: Secondary | ICD-10-CM

## 2019-09-26 DIAGNOSIS — M545 Low back pain: Secondary | ICD-10-CM | POA: Diagnosis not present

## 2019-09-26 DIAGNOSIS — Z96651 Presence of right artificial knee joint: Secondary | ICD-10-CM | POA: Diagnosis present

## 2019-09-26 DIAGNOSIS — Z833 Family history of diabetes mellitus: Secondary | ICD-10-CM

## 2019-09-26 NOTE — Telephone Encounter (Signed)
    Mechele Claude from [facility] calling to review/ clarify medication list  Please call [phone]

## 2019-09-26 NOTE — ED Triage Notes (Signed)
Pt comes in from home with her daughter, she states that she's been falling a lot lately, this am she states that she slipped out of bed  Pt states that she is sore all over and wants to be checked Pt denies any passing out, she states that she is loosing her balance

## 2019-09-27 ENCOUNTER — Emergency Department (HOSPITAL_COMMUNITY): Payer: Medicare Other

## 2019-09-27 ENCOUNTER — Observation Stay (HOSPITAL_COMMUNITY): Payer: Medicare Other

## 2019-09-27 ENCOUNTER — Inpatient Hospital Stay (HOSPITAL_COMMUNITY)
Admission: EM | Admit: 2019-09-27 | Discharge: 2019-09-29 | DRG: 052 | Disposition: A | Discharge status: Home Health | Payer: Medicare Other | Attending: Internal Medicine | Admitting: Internal Medicine

## 2019-09-27 ENCOUNTER — Other Ambulatory Visit (HOSPITAL_COMMUNITY): Payer: Self-pay

## 2019-09-27 ENCOUNTER — Encounter (HOSPITAL_COMMUNITY): Payer: Self-pay | Admitting: Family Medicine

## 2019-09-27 ENCOUNTER — Other Ambulatory Visit: Payer: Self-pay

## 2019-09-27 DIAGNOSIS — R531 Weakness: Secondary | ICD-10-CM

## 2019-09-27 DIAGNOSIS — J45909 Unspecified asthma, uncomplicated: Secondary | ICD-10-CM | POA: Diagnosis present

## 2019-09-27 DIAGNOSIS — J4520 Mild intermittent asthma, uncomplicated: Secondary | ICD-10-CM

## 2019-09-27 DIAGNOSIS — F0391 Unspecified dementia with behavioral disturbance: Secondary | ICD-10-CM | POA: Diagnosis present

## 2019-09-27 DIAGNOSIS — S92351A Displaced fracture of fifth metatarsal bone, right foot, initial encounter for closed fracture: Secondary | ICD-10-CM | POA: Diagnosis not present

## 2019-09-27 DIAGNOSIS — N183 Chronic kidney disease, stage 3 unspecified: Secondary | ICD-10-CM | POA: Diagnosis present

## 2019-09-27 DIAGNOSIS — Z794 Long term (current) use of insulin: Secondary | ICD-10-CM

## 2019-09-27 DIAGNOSIS — R52 Pain, unspecified: Secondary | ICD-10-CM

## 2019-09-27 DIAGNOSIS — M4804 Spinal stenosis, thoracic region: Secondary | ICD-10-CM | POA: Diagnosis not present

## 2019-09-27 DIAGNOSIS — I5032 Chronic diastolic (congestive) heart failure: Secondary | ICD-10-CM | POA: Diagnosis present

## 2019-09-27 DIAGNOSIS — I10 Essential (primary) hypertension: Secondary | ICD-10-CM | POA: Diagnosis not present

## 2019-09-27 DIAGNOSIS — S92353A Displaced fracture of fifth metatarsal bone, unspecified foot, initial encounter for closed fracture: Secondary | ICD-10-CM | POA: Diagnosis present

## 2019-09-27 DIAGNOSIS — M4802 Spinal stenosis, cervical region: Secondary | ICD-10-CM | POA: Diagnosis not present

## 2019-09-27 DIAGNOSIS — G894 Chronic pain syndrome: Secondary | ICD-10-CM | POA: Diagnosis present

## 2019-09-27 DIAGNOSIS — R296 Repeated falls: Secondary | ICD-10-CM | POA: Diagnosis not present

## 2019-09-27 DIAGNOSIS — S161XXA Strain of muscle, fascia and tendon at neck level, initial encounter: Secondary | ICD-10-CM

## 2019-09-27 DIAGNOSIS — E1165 Type 2 diabetes mellitus with hyperglycemia: Secondary | ICD-10-CM

## 2019-09-27 DIAGNOSIS — S76011A Strain of muscle, fascia and tendon of right hip, initial encounter: Secondary | ICD-10-CM

## 2019-09-27 DIAGNOSIS — N1831 Chronic kidney disease, stage 3a: Secondary | ICD-10-CM

## 2019-09-27 DIAGNOSIS — G9520 Unspecified cord compression: Secondary | ICD-10-CM | POA: Diagnosis present

## 2019-09-27 DIAGNOSIS — R739 Hyperglycemia, unspecified: Secondary | ICD-10-CM

## 2019-09-27 DIAGNOSIS — F0392 Unspecified dementia, unspecified severity, with psychotic disturbance: Secondary | ICD-10-CM | POA: Diagnosis present

## 2019-09-27 DIAGNOSIS — N179 Acute kidney failure, unspecified: Secondary | ICD-10-CM

## 2019-09-27 DIAGNOSIS — M48061 Spinal stenosis, lumbar region without neurogenic claudication: Secondary | ICD-10-CM | POA: Diagnosis not present

## 2019-09-27 DIAGNOSIS — S060X9A Concussion with loss of consciousness of unspecified duration, initial encounter: Secondary | ICD-10-CM

## 2019-09-27 LAB — URINALYSIS, ROUTINE W REFLEX MICROSCOPIC
Bacteria, UA: NONE SEEN
Bilirubin Urine: NEGATIVE
Glucose, UA: >=500 mg/dL — AB
Hgb urine dipstick: NEGATIVE
Ketones, ur: NEGATIVE mg/dL
Nitrite: NEGATIVE
Protein, ur: NEGATIVE mg/dL
Specific Gravity, Urine: 1.017 (ref 1.005–1.030)
pH: 5 (ref 5.0–8.0)

## 2019-09-27 LAB — CBC WITH DIFFERENTIAL/PLATELET
Abs Immature Granulocytes: 0.02 10*3/uL (ref 0.00–0.07)
Basophils Absolute: 0 10*3/uL (ref 0.0–0.1)
Basophils Relative: 1 %
Eosinophils Absolute: 0.2 10*3/uL (ref 0.0–0.5)
Eosinophils Relative: 4 %
HCT: 38.5 % (ref 36.0–46.0)
Hemoglobin: 12.3 g/dL (ref 12.0–15.0)
Immature Granulocytes: 0 %
Lymphocytes Relative: 24 %
Lymphs Abs: 1.5 10*3/uL (ref 0.7–4.0)
MCH: 29.4 pg (ref 26.0–34.0)
MCHC: 31.9 g/dL (ref 30.0–36.0)
MCV: 92.1 fL (ref 80.0–100.0)
Monocytes Absolute: 0.9 10*3/uL (ref 0.1–1.0)
Monocytes Relative: 14 %
Neutro Abs: 3.5 10*3/uL (ref 1.7–7.7)
Neutrophils Relative %: 57 %
Platelets: 163 10*3/uL (ref 150–400)
RBC: 4.18 MIL/uL (ref 3.87–5.11)
RDW: 15.3 % (ref 11.5–15.5)
WBC: 6.1 10*3/uL (ref 4.0–10.5)
nRBC: 0 % (ref 0.0–0.2)

## 2019-09-27 LAB — GLUCOSE, CAPILLARY
Glucose-Capillary: 179 mg/dL — ABNORMAL HIGH (ref 70–99)
Glucose-Capillary: 53 mg/dL — ABNORMAL LOW (ref 70–99)
Glucose-Capillary: 51 mg/dL — ABNORMAL LOW (ref 70–99)
Glucose-Capillary: 62 mg/dL — ABNORMAL LOW (ref 70–99)
Glucose-Capillary: 98 mg/dL (ref 70–99)
Glucose-Capillary: 167 mg/dL — ABNORMAL HIGH (ref 70–99)
Glucose-Capillary: 197 mg/dL — ABNORMAL HIGH (ref 70–99)

## 2019-09-27 LAB — BASIC METABOLIC PANEL
Anion gap: 8 (ref 5–15)
BUN: 32 mg/dL — ABNORMAL HIGH (ref 8–23)
CO2: 25 mmol/L (ref 22–32)
Calcium: 9.5 mg/dL (ref 8.9–10.3)
Chloride: 107 mmol/L (ref 98–111)
Creatinine, Ser: 1.07 mg/dL — ABNORMAL HIGH (ref 0.44–1.00)
GFR calc Af Amer: 58 mL/min — ABNORMAL LOW (ref >60)
GFR calc non Af Amer: 50 mL/min — ABNORMAL LOW (ref >60)
Glucose, Bld: 320 mg/dL — ABNORMAL HIGH (ref 70–99)
Potassium: 4.3 mmol/L (ref 3.5–5.1)
Sodium: 140 mmol/L (ref 135–145)

## 2019-09-27 LAB — CBG MONITORING, ED: Glucose-Capillary: 299 mg/dL — ABNORMAL HIGH (ref 70–99)

## 2019-09-27 LAB — HEMOGLOBIN A1C
Hgb A1c MFr Bld: 10 % — ABNORMAL HIGH (ref 4.8–5.6)
Mean Plasma Glucose: 240.3 mg/dL

## 2019-09-27 LAB — SARS CORONAVIRUS 2 (TAT 6-24 HRS): SARS Coronavirus 2: NEGATIVE

## 2019-09-27 MED ORDER — INSULIN ASPART 100 UNIT/ML ~~LOC~~ SOLN
0.0000 [IU] | Freq: Three times a day (TID) | SUBCUTANEOUS | Status: DC
  Administered 2019-09-27: 2 [IU] via SUBCUTANEOUS
  Administered 2019-09-28: 1 [IU] via SUBCUTANEOUS
  Administered 2019-09-28: 2 [IU] via SUBCUTANEOUS
  Filled 2019-09-27: qty 0.09

## 2019-09-27 MED ORDER — INSULIN ASPART 100 UNIT/ML ~~LOC~~ SOLN
0.0000 [IU] | Freq: Every day | SUBCUTANEOUS | Status: DC
  Filled 2019-09-27: qty 0.05

## 2019-09-27 MED ORDER — ACETAMINOPHEN 325 MG PO TABS: 650.0000 mg | ORAL_TABLET | Freq: Four times a day (QID) | ORAL | Status: DC | PRN

## 2019-09-27 MED ORDER — HYDROCODONE-ACETAMINOPHEN 5-325 MG PO TABS
1.0000 | ORAL_TABLET | ORAL | Status: DC | PRN
  Administered 2019-09-27: 2 via ORAL
  Filled 2019-09-27 (×3): qty 2

## 2019-09-27 MED ORDER — POLYETHYLENE GLYCOL 3350 17 G PO PACK: 17.0000 g | PACK | Freq: Every day | ORAL | Status: DC | PRN

## 2019-09-27 MED ORDER — GLUCOSE 40 % PO GEL
ORAL | Status: AC
  Administered 2019-09-27: 37.5 g
  Filled 2019-09-27: qty 1

## 2019-09-27 MED ORDER — ASPIRIN 81 MG PO CHEW
81.0000 mg | CHEWABLE_TABLET | Freq: Every day | ORAL | Status: DC
  Administered 2019-09-27: 81 mg via ORAL
  Filled 2019-09-27: qty 1

## 2019-09-27 MED ORDER — METOPROLOL SUCCINATE ER 100 MG PO TB24
100.0000 mg | ORAL_TABLET | Freq: Every day | ORAL | Status: DC
  Administered 2019-09-27 – 2019-09-28 (×2): 100 mg via ORAL
  Filled 2019-09-27 (×2): qty 1

## 2019-09-27 MED ORDER — ALBUTEROL SULFATE (2.5 MG/3ML) 0.083% IN NEBU: 2.5000 mg | INHALATION_SOLUTION | Freq: Four times a day (QID) | RESPIRATORY_TRACT | Status: DC | PRN

## 2019-09-27 MED ORDER — DIPHENHYDRAMINE HCL 25 MG PO CAPS: 25.0000 mg | ORAL_CAPSULE | Freq: Three times a day (TID) | ORAL | Status: DC | PRN

## 2019-09-27 MED ORDER — SODIUM CHLORIDE 0.9% FLUSH: 3.0000 mL | INTRAVENOUS | Status: DC | PRN

## 2019-09-27 MED ORDER — INSULIN GLARGINE 100 UNIT/ML ~~LOC~~ SOLN
25.0000 [IU] | Freq: Every day | SUBCUTANEOUS | Status: DC
  Administered 2019-09-27: 25 [IU] via SUBCUTANEOUS
  Filled 2019-09-27 (×3): qty 0.25

## 2019-09-27 MED ORDER — ACETAMINOPHEN 650 MG RE SUPP: 650.0000 mg | Freq: Four times a day (QID) | RECTAL | Status: DC | PRN

## 2019-09-27 MED ORDER — LORAZEPAM 2 MG/ML IJ SOLN: 1.0000 mg | Freq: Once | INTRAMUSCULAR | Status: DC | PRN

## 2019-09-27 MED ORDER — FENTANYL CITRATE (PF) 100 MCG/2ML IJ SOLN
50.0000 ug | Freq: Once | INTRAMUSCULAR | Status: AC
  Administered 2019-09-27: 50 ug via INTRAVENOUS
  Filled 2019-09-27: qty 2

## 2019-09-27 MED ORDER — SODIUM CHLORIDE 0.9% FLUSH
3.0000 mL | Freq: Two times a day (BID) | INTRAVENOUS | Status: DC
  Administered 2019-09-27: 3 mL via INTRAVENOUS

## 2019-09-27 MED ORDER — SODIUM CHLORIDE 0.9 % IV SOLN: 250.0000 mL | INTRAVENOUS | Status: DC | PRN

## 2019-09-27 MED ORDER — ONDANSETRON HCL 4 MG PO TABS: 4.0000 mg | ORAL_TABLET | Freq: Four times a day (QID) | ORAL | Status: DC | PRN

## 2019-09-27 MED ORDER — ALBUTEROL SULFATE HFA 108 (90 BASE) MCG/ACT IN AERS: 2.0000 | INHALATION_SPRAY | Freq: Two times a day (BID) | RESPIRATORY_TRACT | Status: DC | PRN

## 2019-09-27 MED ORDER — ENOXAPARIN SODIUM 40 MG/0.4ML ~~LOC~~ SOLN
40.0000 mg | SUBCUTANEOUS | Status: DC
  Administered 2019-09-27: 40 mg via SUBCUTANEOUS
  Filled 2019-09-27: qty 0.4

## 2019-09-27 MED ORDER — TIZANIDINE HCL 4 MG PO TABS
2.0000 mg | ORAL_TABLET | Freq: Three times a day (TID) | ORAL | Status: DC | PRN
  Administered 2019-09-28: 2 mg via ORAL
  Filled 2019-09-27: qty 1

## 2019-09-27 MED ORDER — AMLODIPINE BESYLATE 5 MG PO TABS
5.0000 mg | ORAL_TABLET | Freq: Every day | ORAL | Status: DC
  Administered 2019-09-27 – 2019-09-28 (×2): 5 mg via ORAL
  Filled 2019-09-27 (×2): qty 1

## 2019-09-27 MED ORDER — KETOROLAC TROMETHAMINE 15 MG/ML IJ SOLN
15.0000 mg | Freq: Four times a day (QID) | INTRAMUSCULAR | Status: DC | PRN
  Administered 2019-09-27: 15 mg via INTRAVENOUS
  Filled 2019-09-27: qty 1

## 2019-09-27 MED ORDER — GLUCOSE 40 % PO GEL: 2.0000 | ORAL | Status: AC

## 2019-09-27 MED ORDER — SODIUM CHLORIDE 0.9% FLUSH
3.0000 mL | Freq: Two times a day (BID) | INTRAVENOUS | Status: DC
  Administered 2019-09-27 – 2019-09-28 (×3): 3 mL via INTRAVENOUS

## 2019-09-27 MED ORDER — ONDANSETRON HCL 4 MG/2ML IJ SOLN: 4.0000 mg | Freq: Four times a day (QID) | INTRAMUSCULAR | Status: DC | PRN

## 2019-09-27 NOTE — Telephone Encounter (Signed)
Phone call placed to patient to offer to schedule a visit with [facility]. Phone rang, with no answer and voicemail box was not set up.

## 2019-09-27 NOTE — Progress Notes (Signed)
PROGRESS NOTE    Holly Weiss  ZCH:[id number] DOB: [birth year] DOA: 09/27/2019 PCP: Biagio Borg, MD    Brief Narrative:  Holly Weiss is a 79 y.o. female with medical history significant for uncontrolled insulin-dependent diabetes mellitus, chronic diastolic CHF, hypertension, asthma, chronic pain, and dementia, now presenting to the emergency department for evaluation of frequent falls with diffuse aches and pains and right-sided weakness.  Patient reports that a son lives with her and helps when he can but she mainly cares for herself.  She reports increasingly frequent falls over the past month, denies loss of consciousness, but reports increase in her chronic back, neck, knee, hip, and ankle pain.  She also reports right-sided weakness that has failed to improve over the past month, most notable in her right arm.  She is right-handed and states that she is no longer able to write due to this weakness.  She denies fevers, chills, chest pain, cough, or shortness of breath.  Upon arrival to the ED, patient is found to be afebrile, saturating well on room air, and with blood pressure 150/100.  EKG features sinus rhythm with PACs.  Chest x-ray notable for vascular congestion and bibasilar atelectasis.  Radiographs of the right knee, lumbar spine, hips and pelvis are notable for degenerative changes but no acute fracture.  No acute hemorrhages noted on motion degraded head CT.  Severe multilevel spondylosis is seen on cervical spine CT.  Chemistry panel features a glucose of 320 and creatinine 1.07.  CBC is unremarkable.  Patient was given IV fentanyl in the ED and Covid PCR screening test is pending.   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  CT head with no acute intracranial findings.  MR brain negative.  MR C-spine notable for severe cervical spinal canal stenosis with cord compression at C3-4, C4-5, T2-3. --Neurosurgery following, Dr. Arnoldo Morale; appreciate assistance --Pending MR T/L-spine --After discussion with Dr. Arnoldo Morale, patient with severe disease and concerning that eventually will lead to paralysis, this was discussed with both the patient and her daughter Holly Weiss.  There currently going to discuss possible surgical intervention with family this evening.  Patient decides to proceed with surgical intervention, will need transfer to [facility] with possible plan to perform a surgery tomorrow afternoon. --Will make n.p.o. after midnight and hold Lovenox if patient and family decides to proceed with surgical invention  Right base fifth metatarsal fracture Discussed case with orthopedics, Dr. Lyla Glassing he recommended place patient in a hard soled postoperative shoe. --PT/OT evaluation  Type 2 diabetes mellitus, poorly controlled Hemoglobin A1c 10.0 which is improved from 14.5 on 06/19/2019.  Taking Lantus 70 units subcutaneously daily, Humalog 15 units subcutaneously every afternoon at home. --Decreased Lantus dose to 25 units subcutaneously daily for poor oral intake --Continue insulin sliding scale for further coverage --Diabetic educator consult  Essential hypertension --Continue metoprolol succinate 100 mg p.o. daily --Started on amlodipine 5 mg p.o. daily for poorly controlled hypertension  CKD stage IIIa Creatinine 0.9-1.1 past year.  Creatinine on admission 1.07, at baseline. --Continue monitor renal function  closely  Asthma Stable, albuterol as needed   DVT prophylaxis: Lovenox; will hold Lovenox tonight for possible need of surgical intervention tomorrow; SCDs Code Status: Full code Family Communication: Updated patient's daughter, Holly Weiss via telephone today Disposition Plan:      Patient is from: Home, lives with son     Anticipated Disposition:  To be determined     Barriers to discharge or conditions that needs to be met prior to discharge: Therapy evaluation, neurosurgery signed off  Consultants:   Neurosurgery - Dr. Arnoldo Morale  Procedures:   none  Antimicrobials:   none   Subjective: Patient seen and examined at bedside, resting comfortably.  Continues with significant weakness of her right upper and lower extremity, she reports has been progressing over the past 2 months.  She reports multiple falls lately.  When asked about her previous surgeries, she knows she underwent previous spinal surgeries but cannot remember details to include performing physician.  Patient states feels very weak and malaise.  No other specific complaints at this time.  Discussed with her that likely would need surgical intervention given cord compression as she may end up becoming paralyzed without intervention.  She states will discuss with the rest of her family, did also update patient's daughter Holly Weiss via telephone.  Objective: Vitals:   09/27/19 0730 09/27/19 0931 09/27/19 0936 09/27/19 1337  BP: 133/74 (!) 179/53 (!) 179/53 106/73  Pulse: (!) 55 (!) 57 (!) 57 (!) 57  Resp: 15 16 16 14   Temp:  (!) 97.5 F (36.4 C) (!) 97.4 F (36.3 C) (!) 97.5 F (36.4 C)  TempSrc:  Oral Oral Oral  SpO2: 100%  100% 100%  Weight:  83 kg    Height:  5' 3"  (1.6 m)      Intake/Output Summary (Last 24 hours) at 09/27/2019 1518 Last data filed at 09/27/2019 1121 Gross per 24 hour  Intake 3 ml  Output --  Net 3 ml   Filed Weights   09/27/19 0931  Weight: 83 kg    Examination:  General exam: Appears  calm and comfortable  Respiratory system: Clear to auscultation. Respiratory effort normal.  Oxygenating well on room air. Cardiovascular system: S1 & S2 heard, RRR. No JVD, murmurs, rubs, gallops or clicks. No pedal edema. Gastrointestinal system: Abdomen is nondistended, soft and nontender. No organomegaly or masses felt. Normal bowel sounds heard. Central nervous system: Alert and oriented.  Decreased muscle strength right upper/lower extremity with significant asymmetry to left, sensation to gross touch intact Extremities: Right upper and lower extremity strength 1/5 Skin: No rashes, lesions or ulcers Psychiatry: Judgement and insight appear poor.  Depressed mood & flat affect appropriate.     Data Reviewed: I have personally reviewed following labs and imaging studies  CBC: Recent Labs  Lab 09/27/19 0258  WBC 6.1  NEUTROABS 3.5  HGB 12.3  HCT 38.5  MCV 92.1  PLT 161   Basic Metabolic Panel: Recent Labs  Lab 09/27/19 0258  NA 140  K 4.3  CL 107  CO2 25  GLUCOSE 320*  BUN 32*  CREATININE 1.07*  CALCIUM 9.5   GFR: Estimated Creatinine Clearance: 44.2 mL/min (A) (by C-G formula based on SCr of 1.07 mg/dL (H)). Liver Function Tests: No results for input(s): AST, ALT, ALKPHOS, BILITOT, PROT, ALBUMIN in the last 168 hours. No results for input(s): LIPASE, AMYLASE in the last 168 hours.  No results for input(s): AMMONIA in the last 168 hours. Coagulation Profile: No results for input(s): INR, PROTIME in the last 168 hours. Cardiac Enzymes: No results for input(s): CKTOTAL, CKMB, CKMBINDEX, TROPONINI in the last 168 hours. BNP (last 3 results) No results for input(s): PROBNP in the last 8760 hours. HbA1C: Recent Labs    09/27/19 0258  HGBA1C 10.0*   CBG: Recent Labs  Lab 09/27/19 0236 09/27/19 1116  GLUCAP 299* 179*   Lipid Profile: No results for input(s): CHOL, HDL, LDLCALC, TRIG, CHOLHDL, LDLDIRECT in the last 72 hours. Thyroid Function Tests: No results  for input(s): TSH, T4TOTAL, FREET4, T3FREE, THYROIDAB in the last 72 hours. Anemia Panel: No results for input(s): VITAMINB12, FOLATE, FERRITIN, TIBC, IRON, RETICCTPCT in the last 72 hours. Sepsis Labs: No results for input(s): PROCALCITON, LATICACIDVEN in the last 168 hours.  Recent Results (from the past 240 hour(s))  SARS CORONAVIRUS 2 (TAT 6-24 HRS) Nasopharyngeal Nasopharyngeal Swab     Status: None   Collection Time: 09/27/19  4:03 AM   Specimen: Nasopharyngeal Swab  Result Value Ref Range Status   SARS Coronavirus 2 NEGATIVE NEGATIVE Final    Comment: (NOTE) SARS-CoV-2 target nucleic acids are NOT DETECTED. The SARS-CoV-2 RNA is generally detectable in upper and lower respiratory specimens during the acute phase of infection. Negative results do not preclude SARS-CoV-2 infection, do not rule out co-infections with other pathogens, and should not be used as the sole basis for treatment or other patient management decisions. Negative results must be combined with clinical observations, patient history, and epidemiological information. The expected result is Negative. Fact Sheet for Patients: SugarRoll.be Fact Sheet for Healthcare Providers: https://www.woods-mathews.com/ This test is not yet approved or cleared by the [place] FDA and  has been authorized for detection and/or diagnosis of SARS-CoV-2 by FDA under an Emergency Use Authorization (EUA). This EUA will remain  in effect (meaning this test can be used) for the duration of the COVID-19 declaration under Section 56 4(b)(1) of the Act, 21 U.S.C. section 360bbb-3(b)(1), unless the authorization is terminated or revoked sooner. Performed at [facility], [place] [address] [place], [place] [address]          Radiology Studies: DG Chest 1 View  Result Date: 09/27/2019 CLINICAL DATA:  [place], short of breath, low back pain EXAM: CHEST  1 VIEW COMPARISON:  10/19/2013  FINDINGS: Single frontal view of the chest demonstrates postsurgical changes from median sternotomy. Cardiac silhouette is enlarged. There is central vascular congestion. Streaky bibasilar consolidation likely reflects atelectasis or scarring. No airspace disease, effusion, or pneumothorax. IMPRESSION: 1. Central vascular congestion. 2. Streaky bibasilar consolidation favor atelectasis or scarring. Electronically Signed   By: Randa Ngo M.D.   On: 09/27/2019 01:55   DG Lumbar Spine Complete  Result Date: 09/27/2019 CLINICAL DATA:  [place] yesterday, low back pain EXAM: LUMBAR SPINE - COMPLETE 4+ VIEW COMPARISON:  07/21/2018 FINDINGS: Frontal, bilateral oblique, and lateral views of the lumbar spine are obtained. Stable postsurgical changes at L5/S1. Prominent spondylosis at L4/L5. Diffuse facet hypertrophy unchanged. No acute displaced fractures.  Alignment is stable. IMPRESSION: 1. No acute bony abnormality. 2. Stable degenerative and postsurgical changes at the lumbosacral junction. Electronically Signed   By: Randa Ngo M.D.   On: 09/27/2019 01:54   CT Head Wo Contrast  Result Date: 09/27/2019 CLINICAL DATA:  Multiple falls, dizziness EXAM: CT HEAD WITHOUT CONTRAST TECHNIQUE: Contiguous axial images were obtained from the base of the skull through the vertex without  intravenous contrast. COMPARISON:  10/19/2013 FINDINGS: Brain: Evaluation limited by patient motion. No acute infarct or hemorrhage. Lateral ventricles and midline structures are unremarkable. No acute extra-axial fluid collections. No mass effect. Vascular: No hyperdense vessel or unexpected calcification. Skull: No acute displaced fracture. Chronic diffuse calvarial thickening with mixed lytic and sclerotic appearance may reflect sequela of hyperparathyroidism. This is stable. Sinuses/Orbits: No acute finding. Other: None IMPRESSION: Negative for acute intracranial pathology. Electronically Signed   By: Randa Ngo M.D.   On:  09/27/2019 02:24   CT Cervical Spine Wo Contrast  Result Date: 09/27/2019 CLINICAL DATA:  Multiple falls, neck pain EXAM: CT CERVICAL SPINE WITHOUT CONTRAST TECHNIQUE: Multidetector CT imaging of the cervical spine was performed without intravenous contrast. Multiplanar CT image reconstructions were also generated. COMPARISON:  None. FINDINGS: Alignment: Straightening of the cervical spine likely due to multilevel spondylosis. Skull base and vertebrae: No acute displaced fractures. Soft tissues and spinal canal: No prevertebral fluid or swelling. No visible canal hematoma. Disc levels: There is extensive multilevel spondylosis and facet hypertrophy. Disc space narrowing most pronounced at C4-5, C5-6, C6-7, C7-T1, and T2/T3. Extensive multilevel facet hypertrophic changes are seen at all levels. Erosive changes are seen along the left C3/C4 facet. Upper chest: Airways patent. Visualized portions of the lung apices are clear. Other: Reconstructed images demonstrate no additional findings. IMPRESSION: 1. Severe multilevel spondylosis and facet hypertrophy. 2. No acute displaced cervical spine fracture. Electronically Signed   By: Randa Ngo M.D.   On: 09/27/2019 02:27   MR BRAIN WO CONTRAST  Result Date: 09/27/2019 CLINICAL DATA:  Frequent falls and right-sided weakness EXAM: MRI HEAD WITHOUT CONTRAST TECHNIQUE: Multiplanar, multiecho pulse sequences of the brain and surrounding structures were obtained without intravenous contrast. COMPARISON:  Correlation made with prior CT imaging FINDINGS: Motion artifact is present. Brain: There is no acute infarction or intracranial hemorrhage. There is no intracranial mass, mass effect, or edema. There is no hydrocephalus or extra-axial fluid collection. Patchy T2 hyperintensity in the supratentorial and pontine white matter is nonspecific but may reflect mild chronic microvascular ischemic changes. Mild disproportionate prominence of the ventricles with respect to  the sulci likely reflects disproportionate central volume loss. Vascular: Major vessel flow voids at the skull base are preserved. Skull and upper cervical spine: Normal marrow signal is preserved. Sinuses/Orbits: Paranasal sinuses are aerated. Orbits are unremarkable. Other: Sella is unremarkable. Right mastoid tip fluid opacification. IMPRESSION: No evidence of recent infarction, hemorrhage, or mass. Mild chronic microvascular ischemic changes. Electronically Signed   By: Macy Mis M.D.   On: 09/27/2019 09:14   MR CERVICAL SPINE WO CONTRAST  Result Date: 09/27/2019 CLINICAL DATA:  Frequent falls, spinal stenosis EXAM: MRI CERVICAL SPINE WITHOUT CONTRAST TECHNIQUE: Multiplanar, multisequence MR imaging of the cervical spine was performed. No intravenous contrast was administered. COMPARISON:  None. FINDINGS: Motion artifact is present. Alignment: Mild multilevel degenerative listhesis. Vertebrae: Multilevel degenerative endplate irregularity. Patchy degenerative endplate marrow changes including edema, greatest at C3-C4. No suspicious osseous lesion. Cord: Suspected multifocal abnormal cord signal related to stenosis, for example at C3-C5, T1, and T2-T3 levels; likely myelomalacia or less likely edema. Posterior Fossa, vertebral arteries, paraspinal tissues: Unremarkable. Disc levels: C2-C3:  No canal or foraminal stenosis. C3-C4: Disc bulge with superimposed right paracentral extrusion, endplate osteophytes, and facet and uncovertebral hypertrophy. Severe canal stenosis with cord compression. Marked right and moderate left foraminal stenosis. C4-C5: Disc bulge with central disc protrusion, endplate osteophytes, and facet and uncovertebral hypertrophy. Severe canal stenosis with cord compression.  Moderate right and marked left foraminal stenosis. C5-C6: Disc bulge, endplate osteophytes, and facet and uncovertebral hypertrophy. Moderate to severe canal stenosis. Moderate foraminal stenosis. C6-C7: Disc  bulge, endplate osteophytes, and facet and uncovertebral hypertrophy. Moderate canal stenosis. No right foraminal stenosis. Mild left foraminal stenosis. C7-T1: Disc bulge, endplate osteophytes, and facet uncovertebral hypertrophy. Severe canal stenosis. Moderate right and mild left foraminal stenosis. Imaged in the sagittal plane only, there are upper thoracic disc protrusions and facet hypertrophy. There is severe canal stenosis with probable cord compression at T2-T3 . IMPRESSION: Advanced multilevel degenerative changes with severe canal stenosis. There is cord compression at C3-C4 and C4-C5 and likely at T2-T3. Abnormal cord signal is present at these levels reflecting myelomalacia or less likely edema. Multilevel neural foraminal stenosis. Electronically Signed   By: Macy Mis M.D.   On: 09/27/2019 09:37   DG Knee Complete 4 Views Right  Result Date: 09/27/2019 CLINICAL DATA:  [place] yesterday, inability to bear weight EXAM: RIGHT KNEE - COMPLETE 4+ VIEW COMPARISON:  01/16/2014 FINDINGS: Frontal, bilateral oblique, lateral views of the right knee are obtained. Postsurgical changes are seen from prior right hip arthroplasty as well as ORIF of the tibia. There are no acute displaced fractures. Alignment is anatomic. No joint effusion. Diffuse vascular calcifications. IMPRESSION: 1. Extensive postsurgical changes as above. No acute bony abnormality. Electronically Signed   By: Randa Ngo M.D.   On: 09/27/2019 01:58   DG Ankle Right Port  Result Date: 09/27/2019 CLINICAL DATA:  Multiple falls, fell 09/26/2018, lateral ankle pain EXAM: PORTABLE RIGHT ANKLE - 2 VIEW COMPARISON:  Knee radiograph 09/27/2019, tibia/fibula radiograph 01/16/2014 FINDINGS: Postsurgical changes from prior open reduction internal fixation of a mid shaft tibia and fibular fractures with exuberant callus formation. The inferior-most screw of the lateral side plate fixation of the tibia is fractured. No other evidence of  hardware failure or complication. Healed fracture deformity of the fibula with exuberant ossification across the displaced fracture defect. Radiolucent screw tracks are noted in the distal fibula. Evaluation is limited by suboptimal oblique mortise view. No definite acute fracture or traumatic malalignment is seen at the level of the ankle. There is a comminuted minimally displaced fracture involving the base of the fifth metatarsal. Associated soft tissue swelling is noted both at the ankle and lateral foot. There are moderate degenerative changes at the ankle. Additional degenerative features noted in the mid and hindfoot as included in the level of imaging. Plantar calcaneal spur. Vascular calcification in the soft tissues. IMPRESSION: 1. Acute comminuted minimally displaced fracture involving the base of the fifth metatarsal. Associated soft tissue swelling. 2. No discrete ankle fracture or traumatic malalignment is seen. Mild soft tissue swelling at the ankle without effusion. 3. Postsurgical changes from prior open reduction internal fixation of a mid shaft tibia fracture with hypertrophic callus formation. The inferior-most screw of the lateral side plate fixation of the tibia is fractured. No other evidence of hardware failure or complication. 4. Healed fracture deformity of the fibula with exuberant callus formation as well. Electronically Signed   By: Lovena Le M.D.   On: 09/27/2019 05:53   DG Hip Unilat W or Wo Pelvis 2-3 Views Right  Result Date: 09/27/2019 CLINICAL DATA:  [place] yesterday, inability to bear weight, pain EXAM: DG HIP (WITH OR WITHOUT PELVIS) 2-3V RIGHT COMPARISON:  None FINDINGS: Frontal view of the pelvis as well as frontal and cross-table lateral views of the right hip are obtained. No acute displaced fractures. Alignment is anatomic.  Symmetrical bilateral hip osteoarthritis. Postsurgical changes at the lumbosacral junction. The remainder of the bony pelvis is unremarkable.  IMPRESSION: 1. No acute fracture. 2. Symmetrical bilateral hip osteoarthritis. Electronically Signed   By: Randa Ngo M.D.   On: 09/27/2019 01:56        Scheduled Meds: . amLODipine  5 mg Oral Daily  . aspirin  81 mg Oral Daily  . enoxaparin (LOVENOX) injection  40 mg Subcutaneous Q24H  . insulin aspart  0-5 Units Subcutaneous QHS  . insulin aspart  0-9 Units Subcutaneous TID WC  . insulin glargine  25 Units Subcutaneous Daily  . metoprolol succinate  100 mg Oral Daily  . sodium chloride flush  3 mL Intravenous Q12H  . sodium chloride flush  3 mL Intravenous Q12H   Continuous Infusions: . sodium chloride       LOS: 0 days    Time spent: 42 minutes spent on chart review, discussion with nursing staff, consultants, updating family and interview/physical exam; more than 50% of that time was spent in counseling and/or coordination of care.    Racheal Mathurin J [place] DO [facility] Available via Epic secure chat 7am-7pm After these hours, please refer to coverage provider listed on [email or web address] 09/27/2019, 3:18 PM

## 2019-09-27 NOTE — Consult Note (Signed)
Reason for Consult: Quadraparesis Referring Physician: Dr. [place]  Holly Weiss is an 79 y.o. female.  HPI: The patient is a 79 year old white female with multiple medical problems, as listed below.  She was admitted for weakness.  She was worked up with a cervical MRI which demonstrated significant multilevel stenosis and spinal cord signal change.  A neurosurgical consultation was requested.  Presently the patient is alert and pleasant.  She is a poor medical historian and cannot give me too many details.  She had a ray cage lumbar fusion in [place] many years ago.  She cannot remember who did that surgery.  She tells me she was admitted to [facility] and at some point told that she should not have any more surgery but she cannot provide me any more details.  She says about 2 months ago she began developing weakness and difficulty with walking.  She does not recall any specific precipitating events.  She is taken a few falls.  She complains of neck pain, thoracic pain and lumbar pain.  He lives with her son but tells me she makes her own medical decisions.  Past Medical History:  Diagnosis Date  . ANEMIA-NOS 01/18/2008  . Arthritis   . 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 ASTHMA, WITH ACUTE EXACERBATION 11/29/2008  . Bilateral carpal tunnel syndrome   . Chronic pain syndrome 01/21/2010  . Depression   . DIABETIC  RETINOPATHY 05/30/2007  . DM, UNCOMPLICATED, TYPE II, UNCONTROLLED 05/30/2007  . Edema of both legs    takes Lasix  . Femur fracture, right ([place])   . GERD (gastroesophageal reflux disease)   . 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 History of blood transfusion    spine surgery  . History of kidney stones   . HYPERLIPIDEMIA 05/30/2007  . HYPERTENSION 10/31/2007  . LOW BACK PAIN 01/18/2008  . Migraine headache   . MORBID OBESITY, HX OF 05/30/2007  . NEPHROLITHIASIS, HX OF 01/18/2008  . Numbness and tingling in hands   . Seasonal allergies   . Shortness of breath     with walking  . SHOULDER PAIN, BILATERAL 04/09/2009  . Spinal stenosis   . Urgency of urination     Past Surgical History:  Procedure Laterality Date  . BACK SURGERY    . CATARACT EXTRACTION    . CHOLECYSTECTOMY    . lumbar disc surgury    . ORIF TIBIA FRACTURE Right 01/16/2014   Procedure: RIGHT TIBIA REPAIR NON-UNION/MALUNION TIBIA WITH SLIDING GRAFT;  Surgeon: Renette Butters, MD;  Location: [facility];  Service: Orthopedics;  Laterality: Right;  . THORACIC FUSION    . TOTAL KNEE ARTHROPLASTY WITH REVISION COMPONENTS Right 07/12/2013   Procedure: TOTAL KNEE ARTHROPLASTY WITH TIBIA REVISION COMPONENTS;  Surgeon: Ninetta Lights, MD;  Location: [facility];  Service: Orthopedics;  Laterality: Right;  . TUBAL LIGATION      Family History  Problem Relation Age of Onset  . Diabetes Mother   . Heart disease Mother   . Dementia Mother   . Lung cancer Father   . Heart disease Father   . Diabetes Sister   . Depression Sister     Social History:  reports that she has never smoked. She uses smokeless tobacco. She reports that she does not drink alcohol or use drugs.  Allergies:  Allergies  Allergen Reactions  . Adhesive [Tape] Itching  . Endocet [Oxycodone-Acetaminophen] Itching  . Latex Itching  . Oxycodone-Acetaminophen Itching    Medications:  I have  reviewed the patient's current medications. Prior to Admission:  Medications Prior to Admission  Medication Sig Dispense Refill Last Dose  . albuterol (VENTOLIN HFA) 108 (90 Base) MCG/ACT inhaler INHALE 2 PUFFS INTO THE LUNGS TWICE DAILY AS NEEDED FOR WHEEZING OR SHORTNESS OF BREATH (Patient taking differently: Inhale 2 puffs into the lungs 2 (two) times daily as needed for wheezing or shortness of breath. ) 18 g 3 Past Week at Unknown time  . aspirin 81 MG tablet Take 1 tablet (81 mg total) by mouth daily. 30 tablet 0 Past Week at Unknown time  . Calcium Carbonate-Vitamin D (CALTRATE 600+D PO) Take 2 tablets by mouth daily.   Past Week  at Unknown time  . furosemide (LASIX) 40 MG tablet 1 tab by mouth once daily (Patient taking differently: Take 20 mg by mouth daily as needed for fluid. ) 90 tablet 3 Past Week at Unknown time  . Insulin Glargine (LANTUS SOLOSTAR) 100 UNIT/ML Solostar Pen ADMINISTER 90 UNITS UNDER THE SKIN DAILY (Patient taking differently: Inject 70 Units into the skin daily. ) 15 mL 5 09/26/2019 at Unknown time  . insulin lispro (HUMALOG KWIKPEN) 100 UNIT/ML KwikPen ADMINISTER 15 UNITS UNDER THE SKIN EVERY EVENING AT DINNER (Patient taking differently: Inject 15 Units into the skin every evening. ) 15 mL 5 Past Week at Unknown time  . metoprolol succinate (TOPROL-XL) 100 MG 24 hr tablet TAKE 1 TABLET BY MOUTH EVERY DAY WITH OR IMMEDIATELY FOLLOWING A MEAL (Patient taking differently: Take 100 mg by mouth daily. WITH OR IMMEDIATELY FOLLOWING A MEAL) 90 tablet 3 09/25/2019 at 0700  . Multiple Vitamins-Minerals (ONE-A-DAY EXTRAS ANTIOXIDANT PO) Take 1 tablet by mouth daily.    Past Week at Unknown time  . tiZANidine (ZANAFLEX) 2 MG tablet Take 1 tablet (2 mg total) by mouth every 6 (six) hours as needed for muscle spasms. (Patient taking differently: Take 2 mg by mouth at bedtime. ) 40 tablet 0 Past Week at Unknown time  . B-D ULTRAFINE III SHORT PEN 31G X 8 MM MISC USE TO CHECK BLOOD SUGERS TWICE DAILY (Patient not taking: Reported on 09/15/2019) 100 each 5   . Blood Glucose Monitoring Suppl ([place]) w/Device KIT Use as directed once daily to check blood sugar.  Diagnosis code 250.02 (Patient not taking: Reported on 09/15/2019) 1 each 0   . Continuous Blood Gluc Receiver (FREESTYLE LIBRE 14 DAY READER) DEVI Apply 1 Device topically daily. Use as directed daily E11.9 (Patient not taking: Reported on 09/15/2019) 1 Device 0   . Continuous Blood Gluc Sensor (FREESTYLE LIBRE 14 DAY SENSOR) MISC Apply 1 Device topically every 14 (fourteen) days. E11.9 (Patient not taking: Reported on 09/15/2019) 6 each 3   .  diclofenac sodium (VOLTAREN) 1 % GEL Apply 2 g topically 4 (four) times daily. To affected joint. (Patient not taking: Reported on 09/19/2019) 100 g 11   . glucose blood (ONE TOUCH TEST STRIPS) test strip Use as directed once daily to check blood sugar.  Diagnosis code E11.9 (Patient not taking: Reported on 09/19/2019) 100 each 11   . Insulin Pen Needle (B-D ULTRAFINE III SHORT PEN) 31G X 8 MM MISC USE TO CHECK BLOOD SUGERS TWICE DAILY (Patient not taking: Reported on 09/19/2019) 100 each 3   . Magnesium 250 MG TABS Take 1 tablet (250 mg total) by mouth daily. (Patient not taking: Reported on 09/19/2019) 90 tablet 0   . ONE TOUCH LANCETS MISC Use as directed once daily to check blood  sugar. DX E11.09 (Patient not taking: Reported on 09/19/2019) 100 each 3   . potassium chloride (K-DUR) 10 MEQ tablet TAKE 1 TABLET BY MOUTH DAILY,(*ONLY ON DAY THE LASIX IS TAKEN*) (Patient not taking: Reported on 09/19/2019) 180 tablet 3 Completed Course at Unknown time  . QUEtiapine (SEROQUEL) 100 MG tablet Take 1 tablet (100 mg total) by mouth 2 (two) times daily. (Patient not taking: Reported on 09/19/2019) 180 tablet 3   . rosuvastatin (CRESTOR) 20 MG tablet TAKE 1/2 TABLET BY MOUTH EVERY DAY (Patient not taking: Reported on 09/19/2019) 90 tablet 0   . triamcinolone (NASACORT) 55 MCG/ACT AERO nasal inhaler Place 2 sprays into the nose daily. (Patient not taking: Reported on 09/27/2019) 1 Inhaler 12 Not Taking at Unknown time   Scheduled: . amLODipine  5 mg Oral Daily  . aspirin  81 mg Oral Daily  . enoxaparin (LOVENOX) injection  40 mg Subcutaneous Q24H  . insulin aspart  0-5 Units Subcutaneous QHS  . insulin aspart  0-9 Units Subcutaneous TID WC  . insulin glargine  25 Units Subcutaneous Daily  . metoprolol succinate  100 mg Oral Daily  . sodium chloride flush  3 mL Intravenous Q12H  . sodium chloride flush  3 mL Intravenous Q12H   Continuous: . sodium chloride     AYT:KZSWFU chloride, acetaminophen **OR**  acetaminophen, albuterol, diphenhydrAMINE, HYDROcodone-acetaminophen, ketorolac, LORazepam, ondansetron **OR** ondansetron (ZOFRAN) IV, polyethylene glycol, sodium chloride flush, tiZANidine Anti-infectives (From admission, onward)   None       Results for orders placed or performed during the hospital encounter of 09/27/19 (from the past 48 hour(s))  CBG monitoring, ED     Status: Abnormal   Collection Time: 09/27/19  2:36 AM  Result Value Ref Range   Glucose-Capillary 299 (H) 70 - 99 mg/dL    Comment: Glucose reference range applies only to samples taken after fasting for at least 8 hours.  Basic metabolic panel     Status: Abnormal   Collection Time: 09/27/19  2:58 AM  Result Value Ref Range   Sodium 140 135 - 145 mmol/L   Potassium 4.3 3.5 - 5.1 mmol/L   Chloride 107 98 - 111 mmol/L   CO2 25 22 - 32 mmol/L   Glucose, Bld 320 (H) 70 - 99 mg/dL    Comment: Glucose reference range applies only to samples taken after fasting for at least 8 hours.   BUN 32 (H) 8 - 23 mg/dL   Creatinine, Ser 1.07 (H) 0.44 - 1.00 mg/dL   Calcium 9.5 8.9 - 10.3 mg/dL   GFR calc non Af Amer 50 (L) >60 mL/min   GFR calc Af Amer 58 (L) >60 mL/min   Anion gap 8 5 - 15    Comment: Performed at [facility], [place] [address], [place], [place] [address]  CBC with Differential/Platelet     Status: None   Collection Time: 09/27/19  2:58 AM  Result Value Ref Range   WBC 6.1 4.0 - 10.5 K/uL   RBC 4.18 3.87 - 5.11 MIL/uL   Hemoglobin 12.3 12.0 - 15.0 g/dL   HCT 38.5 36.0 - 46.0 %   MCV 92.1 80.0 - 100.0 fL   MCH 29.4 26.0 - 34.0 pg   MCHC 31.9 30.0 - 36.0 g/dL   RDW 15.3 11.5 - 15.5 %   Platelets 163 150 - 400 K/uL   nRBC 0.0 0.0 - 0.2 %   Neutrophils Relative % 57 %   Neutro Abs 3.5  1.7 - 7.7 K/uL   Lymphocytes Relative 24 %   Lymphs Abs 1.5 0.7 - 4.0 K/uL   Monocytes Relative 14 %   Monocytes Absolute 0.9 0.1 - 1.0 K/uL   Eosinophils Relative 4 %   Eosinophils Absolute 0.2 0.0 -  0.5 K/uL   Basophils Relative 1 %   Basophils Absolute 0.0 0.0 - 0.1 K/uL   Immature Granulocytes 0 %   Abs Immature Granulocytes 0.02 0.00 - 0.07 K/uL    Comment: Performed at [facility], [place] [address], [place], [place] [address]  Hemoglobin A1c     Status: Abnormal   Collection Time: 09/27/19  2:58 AM  Result Value Ref Range   Hgb A1c MFr Bld 10.0 (H) 4.8 - 5.6 %    Comment: (NOTE) Pre diabetes:          5.7%-6.4% Diabetes:              >6.4% Glycemic control for   <7.0% adults with diabetes    Mean Plasma Glucose 240.3 mg/dL    Comment: Performed at [place] [address] [place], [place] [address]  Urinalysis, Routine w reflex microscopic     Status: Abnormal   Collection Time: 09/27/19  3:52 AM  Result Value Ref Range   Color, Urine YELLOW YELLOW   APPearance CLEAR CLEAR   Specific Gravity, Urine 1.017 1.005 - 1.030   pH 5.0 5.0 - 8.0   Glucose, UA >=500 (A) NEGATIVE mg/dL   Hgb urine dipstick NEGATIVE NEGATIVE   Bilirubin Urine NEGATIVE NEGATIVE   Ketones, ur NEGATIVE NEGATIVE mg/dL   Protein, ur NEGATIVE NEGATIVE mg/dL   Nitrite NEGATIVE NEGATIVE   Leukocytes,Ua TRACE (A) NEGATIVE   RBC / HPF 0-5 0 - 5 RBC/hpf   WBC, UA 0-5 0 - 5 WBC/hpf   Bacteria, UA NONE SEEN NONE SEEN   Squamous Epithelial / LPF 0-5 0 - 5    Comment: Performed at [facility], [place] [address], [place], [place] [address]  SARS CORONAVIRUS 2 (TAT 6-24 HRS) Nasopharyngeal Nasopharyngeal Swab     Status: None   Collection Time: 09/27/19  4:03 AM   Specimen: Nasopharyngeal Swab  Result Value Ref Range   SARS Coronavirus 2 NEGATIVE NEGATIVE    Comment: (NOTE) SARS-CoV-2 target nucleic acids are NOT DETECTED. The SARS-CoV-2 RNA is generally detectable in upper and lower respiratory specimens during the acute phase of infection. Negative results do not preclude SARS-CoV-2 infection, do not rule out co-infections with other pathogens, and should not  be used as the sole basis for treatment or other patient management decisions. Negative results must be combined with clinical observations, patient history, and epidemiological information. The expected result is Negative. Fact Sheet for Patients: SugarRoll.be Fact Sheet for Healthcare Providers: https://www.woods-mathews.com/ This test is not yet approved or cleared by the [place] FDA and  has been authorized for detection and/or diagnosis of SARS-CoV-2 by FDA under an Emergency Use Authorization (EUA). This EUA will remain  in effect (meaning this test can be used) for the duration of the COVID-19 declaration under Section 56 4(b)(1) of the Act, 21 U.S.C. section 360bbb-3(b)(1), unless the authorization is terminated or revoked sooner. Performed at [facility], [place] [address], [place], [place] [address]   Glucose, capillary     Status: Abnormal   Collection Time: 09/27/19 11:16 AM  Result Value Ref Range   Glucose-Capillary 179 (H) 70 - 99 mg/dL  Comment: Glucose reference range applies only to samples taken after fasting for at least 8 hours.    DG Chest 1 View  Result Date: 09/27/2019 CLINICAL DATA:  [place], short of breath, low back pain EXAM: CHEST  1 VIEW COMPARISON:  10/19/2013 FINDINGS: Single frontal view of the chest demonstrates postsurgical changes from median sternotomy. Cardiac silhouette is enlarged. There is central vascular congestion. Streaky bibasilar consolidation likely reflects atelectasis or scarring. No airspace disease, effusion, or pneumothorax. IMPRESSION: 1. Central vascular congestion. 2. Streaky bibasilar consolidation favor atelectasis or scarring. Electronically Signed   By: Randa Ngo M.D.   On: 09/27/2019 01:55   DG Lumbar Spine Complete  Result Date: 09/27/2019 CLINICAL DATA:  [place] yesterday, low back pain EXAM: LUMBAR SPINE - COMPLETE 4+ VIEW COMPARISON:  07/21/2018 FINDINGS: Frontal,  bilateral oblique, and lateral views of the lumbar spine are obtained. Stable postsurgical changes at L5/S1. Prominent spondylosis at L4/L5. Diffuse facet hypertrophy unchanged. No acute displaced fractures.  Alignment is stable. IMPRESSION: 1. No acute bony abnormality. 2. Stable degenerative and postsurgical changes at the lumbosacral junction. Electronically Signed   By: Randa Ngo M.D.   On: 09/27/2019 01:54   CT Head Wo Contrast  Result Date: 09/27/2019 CLINICAL DATA:  Multiple falls, dizziness EXAM: CT HEAD WITHOUT CONTRAST TECHNIQUE: Contiguous axial images were obtained from the base of the skull through the vertex without intravenous contrast. COMPARISON:  10/19/2013 FINDINGS: Brain: Evaluation limited by patient motion. No acute infarct or hemorrhage. Lateral ventricles and midline structures are unremarkable. No acute extra-axial fluid collections. No mass effect. Vascular: No hyperdense vessel or unexpected calcification. Skull: No acute displaced fracture. Chronic diffuse calvarial thickening with mixed lytic and sclerotic appearance may reflect sequela of hyperparathyroidism. This is stable. Sinuses/Orbits: No acute finding. Other: None IMPRESSION: Negative for acute intracranial pathology. Electronically Signed   By: Randa Ngo M.D.   On: 09/27/2019 02:24   CT Cervical Spine Wo Contrast  Result Date: 09/27/2019 CLINICAL DATA:  Multiple falls, neck pain EXAM: CT CERVICAL SPINE WITHOUT CONTRAST TECHNIQUE: Multidetector CT imaging of the cervical spine was performed without intravenous contrast. Multiplanar CT image reconstructions were also generated. COMPARISON:  None. FINDINGS: Alignment: Straightening of the cervical spine likely due to multilevel spondylosis. Skull base and vertebrae: No acute displaced fractures. Soft tissues and spinal canal: No prevertebral fluid or swelling. No visible canal hematoma. Disc levels: There is extensive multilevel spondylosis and facet hypertrophy.  Disc space narrowing most pronounced at C4-5, C5-6, C6-7, C7-T1, and T2/T3. Extensive multilevel facet hypertrophic changes are seen at all levels. Erosive changes are seen along the left C3/C4 facet. Upper chest: Airways patent. Visualized portions of the lung apices are clear. Other: Reconstructed images demonstrate no additional findings. IMPRESSION: 1. Severe multilevel spondylosis and facet hypertrophy. 2. No acute displaced cervical spine fracture. Electronically Signed   By: Randa Ngo M.D.   On: 09/27/2019 02:27   MR BRAIN WO CONTRAST  Result Date: 09/27/2019 CLINICAL DATA:  Frequent falls and right-sided weakness EXAM: MRI HEAD WITHOUT CONTRAST TECHNIQUE: Multiplanar, multiecho pulse sequences of the brain and surrounding structures were obtained without intravenous contrast. COMPARISON:  Correlation made with prior CT imaging FINDINGS: Motion artifact is present. Brain: There is no acute infarction or intracranial hemorrhage. There is no intracranial mass, mass effect, or edema. There is no hydrocephalus or extra-axial fluid collection. Patchy T2 hyperintensity in the supratentorial and pontine white matter is nonspecific but may reflect mild chronic microvascular ischemic changes. Mild disproportionate prominence of the  ventricles with respect to the sulci likely reflects disproportionate central volume loss. Vascular: Major vessel flow voids at the skull base are preserved. Skull and upper cervical spine: Normal marrow signal is preserved. Sinuses/Orbits: Paranasal sinuses are aerated. Orbits are unremarkable. Other: Sella is unremarkable. Right mastoid tip fluid opacification. IMPRESSION: No evidence of recent infarction, hemorrhage, or mass. Mild chronic microvascular ischemic changes. Electronically Signed   By: Macy Mis M.D.   On: 09/27/2019 09:14   MR CERVICAL SPINE WO CONTRAST  Result Date: 09/27/2019 CLINICAL DATA:  Frequent falls, spinal stenosis EXAM: MRI CERVICAL SPINE WITHOUT  CONTRAST TECHNIQUE: Multiplanar, multisequence MR imaging of the cervical spine was performed. No intravenous contrast was administered. COMPARISON:  None. FINDINGS: Motion artifact is present. Alignment: Mild multilevel degenerative listhesis. Vertebrae: Multilevel degenerative endplate irregularity. Patchy degenerative endplate marrow changes including edema, greatest at C3-C4. No suspicious osseous lesion. Cord: Suspected multifocal abnormal cord signal related to stenosis, for example at C3-C5, T1, and T2-T3 levels; likely myelomalacia or less likely edema. Posterior Fossa, vertebral arteries, paraspinal tissues: Unremarkable. Disc levels: C2-C3:  No canal or foraminal stenosis. C3-C4: Disc bulge with superimposed right paracentral extrusion, endplate osteophytes, and facet and uncovertebral hypertrophy. Severe canal stenosis with cord compression. Marked right and moderate left foraminal stenosis. C4-C5: Disc bulge with central disc protrusion, endplate osteophytes, and facet and uncovertebral hypertrophy. Severe canal stenosis with cord compression. Moderate right and marked left foraminal stenosis. C5-C6: Disc bulge, endplate osteophytes, and facet and uncovertebral hypertrophy. Moderate to severe canal stenosis. Moderate foraminal stenosis. C6-C7: Disc bulge, endplate osteophytes, and facet and uncovertebral hypertrophy. Moderate canal stenosis. No right foraminal stenosis. Mild left foraminal stenosis. C7-T1: Disc bulge, endplate osteophytes, and facet uncovertebral hypertrophy. Severe canal stenosis. Moderate right and mild left foraminal stenosis. Imaged in the sagittal plane only, there are upper thoracic disc protrusions and facet hypertrophy. There is severe canal stenosis with probable cord compression at T2-T3 . IMPRESSION: Advanced multilevel degenerative changes with severe canal stenosis. There is cord compression at C3-C4 and C4-C5 and likely at T2-T3. Abnormal cord signal is present at these  levels reflecting myelomalacia or less likely edema. Multilevel neural foraminal stenosis. Electronically Signed   By: Macy Mis M.D.   On: 09/27/2019 09:37   DG Knee Complete 4 Views Right  Result Date: 09/27/2019 CLINICAL DATA:  [place] yesterday, inability to bear weight EXAM: RIGHT KNEE - COMPLETE 4+ VIEW COMPARISON:  01/16/2014 FINDINGS: Frontal, bilateral oblique, lateral views of the right knee are obtained. Postsurgical changes are seen from prior right hip arthroplasty as well as ORIF of the tibia. There are no acute displaced fractures. Alignment is anatomic. No joint effusion. Diffuse vascular calcifications. IMPRESSION: 1. Extensive postsurgical changes as above. No acute bony abnormality. Electronically Signed   By: Randa Ngo M.D.   On: 09/27/2019 01:58   DG Ankle Right Port  Result Date: 09/27/2019 CLINICAL DATA:  Multiple falls, fell 09/26/2018, lateral ankle pain EXAM: PORTABLE RIGHT ANKLE - 2 VIEW COMPARISON:  Knee radiograph 09/27/2019, tibia/fibula radiograph 01/16/2014 FINDINGS: Postsurgical changes from prior open reduction internal fixation of a mid shaft tibia and fibular fractures with exuberant callus formation. The inferior-most screw of the lateral side plate fixation of the tibia is fractured. No other evidence of hardware failure or complication. Healed fracture deformity of the fibula with exuberant ossification across the displaced fracture defect. Radiolucent screw tracks are noted in the distal fibula. Evaluation is limited by suboptimal oblique mortise view. No definite acute fracture or traumatic malalignment is  seen at the level of the ankle. There is a comminuted minimally displaced fracture involving the base of the fifth metatarsal. Associated soft tissue swelling is noted both at the ankle and lateral foot. There are moderate degenerative changes at the ankle. Additional degenerative features noted in the mid and hindfoot as included in the level of imaging.  Plantar calcaneal spur. Vascular calcification in the soft tissues. IMPRESSION: 1. Acute comminuted minimally displaced fracture involving the base of the fifth metatarsal. Associated soft tissue swelling. 2. No discrete ankle fracture or traumatic malalignment is seen. Mild soft tissue swelling at the ankle without effusion. 3. Postsurgical changes from prior open reduction internal fixation of a mid shaft tibia fracture with hypertrophic callus formation. The inferior-most screw of the lateral side plate fixation of the tibia is fractured. No other evidence of hardware failure or complication. 4. Healed fracture deformity of the fibula with exuberant callus formation as well. Electronically Signed   By: Lovena Le M.D.   On: 09/27/2019 05:53   DG Hip Unilat W or Wo Pelvis 2-3 Views Right  Result Date: 09/27/2019 CLINICAL DATA:  [place] yesterday, inability to bear weight, pain EXAM: DG HIP (WITH OR WITHOUT PELVIS) 2-3V RIGHT COMPARISON:  None FINDINGS: Frontal view of the pelvis as well as frontal and cross-table lateral views of the right hip are obtained. No acute displaced fractures. Alignment is anatomic. Symmetrical bilateral hip osteoarthritis. Postsurgical changes at the lumbosacral junction. The remainder of the bony pelvis is unremarkable. IMPRESSION: 1. No acute fracture. 2. Symmetrical bilateral hip osteoarthritis. Electronically Signed   By: Randa Ngo M.D.   On: 09/27/2019 01:56    ROS Blood pressure 106/73, pulse (!) 57, temperature (!) 97.5 F (36.4 C), temperature source Oral, resp. rate 14, height 5' 3"  (1.6 m), weight 83 kg, SpO2 100 %. Estimated body mass index is 32.41 kg/m as calculated from the following:   Height as of this encounter: 5' 3"  (1.6 m).   Weight as of this encounter: 83 kg.  Physical Exam  General: A alert and pleasant unhealthy appearing 79 year old black female in no apparent distress  HEENT: Normocephalic, atraumatic, alopecia, her pupils are equal round  and reactive, extraocular muscles are intact  Neck: Supple without masses or deformities.  She has a limited cervical range of motion.  Thorax: Symmetric  Abdomen: Obese and soft  Extremities: She has lower extremity edema and wounds  Neurologic exam: The patient is alert and oriented x2, person and hospital.  Anal nerves II through XII were examined bilaterally and grossly normal.  Vision and hearing are grossly normal bilaterally.  The patient's motor strength is as follows: The patient has 0/5 right deltoid strength, 3 to 4-/5 right bicep and tricep strength, 1/5 right hand grip strength, 4-/5 left deltoid, bicep, tricep, hand grip, bilateral quadricep, gastrocnemius and dorsiflexors strength.  Sensory function is intact to light touch.  Cerebellar function difficult with the weakness in the right hand.  I have reviewed the patient's cervical MRI performed at [facility] today.  The sagittal images demonstrates she has a degenerative spinal listhesis at C3-4.  She has severe spinal stenosis at C3-4, C4-5 and C5-6.  For some reason there are no T2 axial images.  She appears to have more moderate stenosis at C6-7 and C7-T1 as well as T2-3.  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   Assessment/Plan: Cervical spondylosis, cervical stenosis, central cord syndrome, Quadraparesis: I have  discussed the situation with Dr. [place]  I recommended that she get cervical T2 axial images, and a thoracic and lumbar MRI.  I have discussed the situation with the patient and told her that she has significant narrowing her neck which is likely causing her Quadraparesis/weakness.  She may have narrowing in her thoracic and lumbar spine as well but we will wait to see what the MRI scan show.  I have discussed the surgical treatment option of a C3-4, C4-5 and C5-6 anterior cervical discectomy and possible C4 and C5 corpectomy with anterior  cervical fusion/plating.  I described the surgery to her.  We discussed the risks of surgery including risk of anesthesia, hemorrhage, infection, injury to the various neck structures, spinal cord injury, failure to relieve her symptoms, worsening symptoms, medical risk, fusion failure, etc.  I have told her that she does not appear to be an ideal surgical candidate given all her medical problems.  We discussed the alternative of "doing nothing", but I told her she would likely continue to worsen and eventually be paralyzed without surgery.  She understands.  She tells me that she makes her own medical decisions.  I have asked her to think about it.  If she wants to do surgery we will need to transfer to [facility] and plan to do it tomorrow afternoon.  I have asked her nurses to pass along my office number to her family members if they have any questions.  Ophelia Charter 09/27/2019, 2:39 PM

## 2019-09-27 NOTE — Progress Notes (Signed)
Aspen c-collar removed per Dr [place]

## 2019-09-27 NOTE — ED Notes (Signed)
2 unsuccessful IV attempts.

## 2019-09-27 NOTE — Progress Notes (Signed)
Occupational Therapy Evaluation Patient Details Name: Holly Weiss MRN: [id number] DOB: [birth year] Today's Date: 09/27/2019    History of Present Illness Holly Weiss is a 79 y.o. female with medical history significant for uncontrolled insulin-dependent diabetes mellitus, chronic diastolic CHF, hypertension, asthma, chronic pain, and dementia, now presenting to the emergency department for evaluation of frequent falls with diffuse aches and pains and right-sided weakness.  Patient reports that a son lives with her and helps when he can but she mainly cares for herself.  She reports increasingly frequent falls over the past month, denies loss of consciousness, but reports increase in her chronic back, neck, knee, hip, and ankle pain.  She also reports right-sided weakness that has failed to improve over the past month, most notable in her right arm.  She is right-handed and states that she is no longer able to write due to this weakness.  She denies fevers, chills, chest pain, cough, or shortness of breath.   Clinical Impression   Patient reports performing self-care tasks with varying stories of no assist and then she would state she required assist from son and daughter with ADL tasks and functional mobility. Patient reports mobilizing with no device, but recently required the use of adaptive devices. Patient presents with cognitive deficits and a high fall risk . Patient has decreased awareness of physical deficits resulting in increase in assist with self-care tasks and functional mobility. Patient has a fear of falling resulting in not able to complete full sit to stand transfer with assist x 2 using RW. Patient will benefit from continued skilled acute OT services.     Follow Up Recommendations  SNF    Equipment Recommendations  Tub/shower bench    Recommendations for Other Services       Precautions / Restrictions Precautions Precautions: Fall Restrictions Weight Bearing Restrictions:  No      Mobility Bed Mobility Overal bed mobility: Needs Assistance Bed Mobility: Sit to Supine;Supine to Sit     Supine to sit: Max assist Sit to supine: Max assist      Transfers Overall transfer level: Needs assistance Equipment used: Rolling walker (2 wheeled) Transfers: Sit to/from Stand Sit to Stand: +2 physical assistance;Total assist         General transfer comment: not able to complete sit to stand transfer with assist of 2 sitting EOB, post op shoe on R FOOT.     Balance Overall balance assessment: Needs assistance   Sitting balance-Leahy Scale: Fair     Standing balance support: Bilateral upper extremity supported Standing balance-Leahy Scale: Poor                             ADL either performed or assessed with clinical judgement   ADL Overall ADL's : Needs assistance/impaired Eating/Feeding: Set up   Grooming: Oral care;Set up   Upper Body Bathing: Moderate assistance   Lower Body Bathing: Total assistance   Upper Body Dressing : Moderate assistance   Lower Body Dressing: Total assistance   Toilet Transfer: +2 for physical assistance;Total assistance   Toileting- Clothing Manipulation and Hygiene: Total assistance   Tub/ Shower Transfer: Total assistance;+2 for physical assistance   Functional mobility during ADLs: Total assistance       Vision Baseline Vision/History: Wears glasses Wears Glasses: At all times       Perception     Praxis      Pertinent Vitals/Pain Pain Assessment: Faces Pain Score:  5  Faces Pain Scale: Hurts little more Pain Location: R foot(during movement) Pain Descriptors / Indicators: Discomfort Pain Intervention(s): Repositioned     Hand Dominance Right   Extremity/Trunk Assessment Upper Extremity Assessment Upper Extremity Assessment: Generalized weakness;RUE deficits/detail;LUE deficits/detail RUE: (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, decreased R UE shoulder flexion to 40) RUE Coordination:  decreased fine motor;decreased gross motor LUE: (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, decreased in L UE shoulder) LUE Coordination: decreased fine motor;decreased gross motor   Lower Extremity Assessment Lower Extremity Assessment: Defer to PT evaluation       Communication Communication Communication: No difficulties   Cognition Arousal/Alertness: Awake/alert Behavior During Therapy: WFL for tasks assessed/performed Overall Cognitive Status: No family/caregiver present to determine baseline cognitive functioning                                 General Comments: A & O  to name and place only, not aware of time, short term and long term memory deficits noted    General Comments       Exercises     Shoulder Instructions      Home Living Family/patient expects to be discharged to:: Private residence Living Arrangements: Children Available Help at Discharge: Family Type of Home: House Home Access: Stairs to enter Technical brewer of Steps: 3 Entrance Stairs-Rails: Left Home Layout: Two level;Able to live on main level with bedroom/bathroom     Bathroom Shower/Tub: Tub/shower unit     Bathroom Accessibility: Yes How Accessible: Accessible via walker Home Equipment: Walker - 2 wheels;Grab bars - toilet;Grab bars - tub/shower;Shower seat          Prior Functioning/Environment Level of Independence: Needs assistance  Gait / Transfers Assistance Needed: (rolling walker occassionally, assist from family ) ADL's / Homemaking Assistance Needed: (assist from son and daughter with ADL and IADL tasks)   Comments: Walker occassionally for mobilization         OT Problem List: Decreased strength;Decreased range of motion;Decreased activity tolerance;Impaired balance (sitting and/or standing);Decreased coordination;Decreased safety awareness;Decreased knowledge of use of DME or AE;Pain;Increased edema      OT Treatment/Interventions: Self-care/ADL training;Therapeutic  exercise;Energy conservation;Therapeutic activities;Patient/family education;Balance training;DME and/or AE instruction;Cognitive remediation/compensation    OT Goals(Current goals can be found in the care plan section) Acute Rehab OT Goals Patient Stated Goal: to be able to write OT Goal Formulation: With patient  OT Frequency: Min 2X/week   Barriers to D/C:            Co-evaluation              AM-PAC OT "6 Clicks" Daily Activity     Outcome Measure Help from another person eating meals?: A Little Help from another person taking care of personal grooming?: A Little Help from another person toileting, which includes using toliet, bedpan, or urinal?: Total Help from another person bathing (including washing, rinsing, drying)?: A Lot Help from another person to put on and taking off regular upper body clothing?: A Lot Help from another person to put on and taking off regular lower body clothing?: Total 6 Click Score: 12   End of Session Equipment Utilized During Treatment: Gait belt;Rolling walker  Activity Tolerance: Patient limited by fatigue;Patient limited by pain Patient left: with call bell/phone within reach;with bed alarm set;in bed  OT Visit Diagnosis: Unsteadiness on feet (R26.81);History of falling (Z91.81);Muscle weakness (generalized) (M62.81)  Time: [id number] OT Time Calculation (min): 38 min Charges:  OT General Charges $OT Visit: 1 Visit OT Evaluation $OT Eval Moderate Complexity: 1 Mod OT Treatments $Self Care/Home Management : 8-22 mins $Therapeutic Activity: 8-22 mins  Holly Weiss OTR/L   Holly Weiss 09/27/2019, 4:06 PM

## 2019-09-27 NOTE — Telephone Encounter (Addendum)
Called and spoke with Holly Weiss she said that the patient is having auditory and vision hallucinations. Holly Weiss is hearing, seeing people and things who are not there.  At night patient says that she sees men at night and is very afraid.  Patient has been non compliant for checking her blood sugar and also taking her medications. Holly Weiss suggested the [employer].   Several medications are not in her home such as Albuterol, Lasix, Tramadol, Magnesium Diclofenac Sodium gel, Potassium.    Holly Weiss wants to know should patient be taking all the medications on the med list that is in Epic?   Please advise

## 2019-09-27 NOTE — Progress Notes (Signed)
Pt CBG 53- alert and orient x 4. 8OZ orange juice and a [place] sandwich given. Will recheck CBG in 15 min.

## 2019-09-27 NOTE — ED Notes (Signed)
Linens changed. [place]wick switched with new one. Pt placed in gown. All belongings gathered and placed in bag in patient possession.

## 2019-09-27 NOTE — H&P (Signed)
History and Physical    Holly Weiss HDQ:[id number] DOB: 20-Feb-1941 DOA: 09/27/2019  PCP: Biagio Borg, MD   Patient coming from: Home   Chief Complaint: Frequent falls, pain, right-sided weakness   HPI: Holly Weiss is a 79 y.o. female with medical history significant for uncontrolled insulin-dependent diabetes mellitus, chronic diastolic CHF, hypertension, asthma, chronic pain, and dementia, now presenting to the emergency department for evaluation of frequent falls with diffuse aches and pains and right-sided weakness.  Patient reports that a son lives with her and helps when he can but she mainly cares for herself.  She reports increasingly frequent falls over the past month, denies loss of consciousness, but reports increase in her chronic back, neck, knee, hip, and ankle pain.  She also reports right-sided weakness that has failed to improve over the past month, most notable in her right arm.  She is right-handed and states that she is no longer able to write due to this weakness.  She denies fevers, chills, chest pain, cough, or shortness of breath.  ED Course: Upon arrival to the ED, patient is found to be afebrile, saturating well on room air, and with blood pressure 150/100.  EKG features sinus rhythm with PACs.  Chest x-ray notable for vascular congestion and bibasilar atelectasis.  Radiographs of the right knee, lumbar spine, hips and pelvis are notable for degenerative changes but no acute fracture.  No acute hemorrhages noted on motion degraded head CT.  Severe multilevel spondylosis is seen on cervical spine CT.  Chemistry panel features a glucose of 320 and creatinine 1.07.  CBC is unremarkable.  Patient was given IV fentanyl in the ED and Covid PCR screening test is pending.  Review of Systems:  All other systems reviewed and apart from HPI, are negative.  Past Medical History:  Diagnosis Date  . ANEMIA-NOS 01/18/2008  . Arthritis   . 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 ASTHMA,  WITH ACUTE EXACERBATION 11/29/2008  . Bilateral carpal tunnel syndrome   . Chronic pain syndrome 01/21/2010  . Depression   . DIABETIC  RETINOPATHY 05/30/2007  . DM, UNCOMPLICATED, TYPE II, UNCONTROLLED 05/30/2007  . Edema of both legs    takes Lasix  . Femur fracture, right ([place])   . GERD (gastroesophageal reflux disease)   . 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 History of blood transfusion    spine surgery  . History of kidney stones   . HYPERLIPIDEMIA 05/30/2007  . HYPERTENSION 10/31/2007  . LOW BACK PAIN 01/18/2008  . Migraine headache   . MORBID OBESITY, HX OF 05/30/2007  . NEPHROLITHIASIS, HX OF 01/18/2008  . Numbness and tingling in hands   . Seasonal allergies   . Shortness of breath    with walking  . SHOULDER PAIN, BILATERAL 04/09/2009  . Spinal stenosis   . Urgency of urination     Past Surgical History:  Procedure Laterality Date  . BACK SURGERY    . CATARACT EXTRACTION    . CHOLECYSTECTOMY    . lumbar disc surgury    . ORIF TIBIA FRACTURE Right 01/16/2014   Procedure: RIGHT TIBIA REPAIR NON-UNION/MALUNION TIBIA WITH SLIDING GRAFT;  Surgeon: Renette Butters, MD;  Location: [facility];  Service: Orthopedics;  Laterality: Right;  . THORACIC FUSION    . TOTAL KNEE ARTHROPLASTY WITH REVISION COMPONENTS Right 07/12/2013   Procedure: TOTAL KNEE ARTHROPLASTY WITH TIBIA REVISION COMPONENTS;  Surgeon: Ninetta Lights, MD;  Location: [facility];  Service: Orthopedics;  Laterality: Right;  . TUBAL  LIGATION       reports that she has never smoked. She uses smokeless tobacco. She reports that she does not drink alcohol or use drugs.  Allergies  Allergen Reactions  . Adhesive [Tape] Itching  . Endocet [Oxycodone-Acetaminophen] Itching  . Latex Itching  . Oxycodone-Acetaminophen Itching    Family History  Problem Relation Age of Onset  . Diabetes Mother   . Heart disease Mother   . Dementia Mother   . Lung cancer Father   . Heart disease Father   . Diabetes Sister   . Depression Sister       Prior to Admission medications   Medication Sig Start Date End Date Taking? Authorizing Provider  albuterol (VENTOLIN HFA) 108 (90 Base) MCG/ACT inhaler INHALE 2 PUFFS INTO THE LUNGS TWICE DAILY AS NEEDED FOR WHEEZING OR SHORTNESS OF BREATH Patient taking differently: Inhale 2 puffs into the lungs 2 (two) times daily as needed for wheezing or shortness of breath.  04/05/18  Yes Biagio Borg, MD  aspirin 81 MG tablet Take 1 tablet (81 mg total) by mouth daily. 01/17/14  Yes Renette Butters, MD  Calcium Carbonate-Vitamin D (CALTRATE 600+D PO) Take 2 tablets by mouth daily.   Yes [provider]  furosemide (LASIX) 40 MG tablet 1 tab by mouth once daily Patient taking differently: Take 20 mg by mouth daily as needed for fluid.  02/14/19  Yes Biagio Borg, MD  Insulin Glargine (LANTUS SOLOSTAR) 100 UNIT/ML Solostar Pen ADMINISTER 90 UNITS UNDER THE SKIN DAILY Patient taking differently: Inject 70 Units into the skin daily.  08/10/19  Yes Biagio Borg, MD  insulin lispro (HUMALOG KWIKPEN) 100 UNIT/ML KwikPen ADMINISTER 15 UNITS UNDER THE SKIN EVERY EVENING AT [facility] Patient taking differently: Inject 15 Units into the skin every evening.  06/19/19  Yes Biagio Borg, MD  metoprolol succinate (TOPROL-XL) 100 MG 24 hr tablet TAKE 1 TABLET BY MOUTH EVERY DAY WITH OR IMMEDIATELY FOLLOWING A MEAL Patient taking differently: Take 100 mg by mouth daily. WITH OR IMMEDIATELY FOLLOWING A MEAL 05/01/19  Yes Biagio Borg, MD  Multiple Vitamins-Minerals (ONE-A-DAY EXTRAS ANTIOXIDANT PO) Take 1 tablet by mouth daily.    Yes [provider]  tiZANidine (ZANAFLEX) 2 MG tablet Take 1 tablet (2 mg total) by mouth every 6 (six) hours as needed for muscle spasms. Patient taking differently: Take 2 mg by mouth at bedtime.  09/01/19  Yes Biagio Borg, MD  B-D ULTRAFINE III SHORT PEN 31G X 8 MM MISC USE TO CHECK BLOOD SUGERS TWICE DAILY Patient not taking: Reported on 09/15/2019 03/02/19   Biagio Borg, MD  Blood Glucose Monitoring Suppl ([place]) w/Device KIT Use as directed once daily to check blood sugar.  Diagnosis code 250.02 Patient not taking: Reported on 09/15/2019 02/25/16   Biagio Borg, MD  Continuous Blood Gluc Receiver (FREESTYLE LIBRE 14 DAY READER) DEVI Apply 1 Device topically daily. Use as directed daily E11.9 Patient not taking: Reported on 09/15/2019 01/06/18   Biagio Borg, MD  Continuous Blood Gluc Sensor (FREESTYLE LIBRE 14 DAY SENSOR) MISC Apply 1 Device topically every 14 (fourteen) days. E11.9 Patient not taking: Reported on 09/15/2019 01/06/18   Biagio Borg, MD  diclofenac sodium (VOLTAREN) 1 % GEL Apply 2 g topically 4 (four) times daily. To affected joint. Patient not taking: Reported on 09/19/2019 11/17/18   Lyndal Pulley, DO  glucose blood (ONE TOUCH TEST STRIPS) test strip Use  as directed once daily to check blood sugar.  Diagnosis code E11.9 Patient not taking: Reported on 09/19/2019 02/25/16   Biagio Borg, MD  Insulin Pen Needle (B-D ULTRAFINE III SHORT PEN) 31G X 8 MM MISC USE TO CHECK BLOOD SUGERS TWICE DAILY Patient not taking: Reported on 09/19/2019 06/28/17   Biagio Borg, MD  Magnesium 250 MG TABS Take 1 tablet (250 mg total) by mouth daily. Patient not taking: Reported on 09/19/2019 02/25/16   Biagio Borg, MD  ONE [facility] LANCETS MISC Use as directed once daily to check blood sugar. DX E11.09 Patient not taking: Reported on 09/19/2019 06/28/17   Biagio Borg, MD  potassium chloride (K-DUR) 10 MEQ tablet TAKE 1 TABLET BY MOUTH DAILY,(*ONLY ON DAY THE LASIX IS TAKEN*) Patient not taking: Reported on 09/19/2019 02/09/18   Biagio Borg, MD  QUEtiapine (SEROQUEL) 100 MG tablet Take 1 tablet (100 mg total) by mouth 2 (two) times daily. Patient not taking: Reported on 09/19/2019 02/14/19   Biagio Borg, MD  rosuvastatin (CRESTOR) 20 MG tablet TAKE 1/2 TABLET BY MOUTH EVERY DAY Patient not taking: Reported on 09/19/2019 08/11/19   Biagio Borg, MD    triamcinolone (NASACORT) 55 MCG/ACT AERO nasal inhaler Place 2 sprays into the nose daily. Patient not taking: Reported on 09/27/2019 02/09/18   Biagio Borg, MD    Physical Exam: Vitals:   09/27/19 0230 09/27/19 0300 09/27/19 0330 09/27/19 0400  BP: (!) 152/99 (!) 194/84 136/64 (!) 135/99  Pulse: 63   68  Resp: 14 15 (!) 21 17  Temp: 98.3 F (36.8 C)   98.1 F (36.7 C)  TempSrc: Oral   Oral  SpO2: 95%   95%    Constitutional: NAD, calm  Eyes: PERTLA, lids and conjunctivae normal ENMT: Mucous membranes are moist. Posterior pharynx clear of any exudate or lesions.   Neck: normal, supple, no masses, no thyromegaly Respiratory: no wheezing, no crackles. No accessory muscle use.  Cardiovascular: S1 & S2 heard, regular rate and rhythm. No extremity edema.   Abdomen: No distension, no tenderness, soft. Bowel sounds active.  Musculoskeletal: Right distal leg deformity and tenderness. Right knee tender.  Right hand contracted.   Skin: Crusted lesions bilateral lower legs. Warm, dry, well-perfused. Neurologic: CN 2-12 grossly intact. Sensation to light touch intact. Strength 4/5 throughout LUE and LLE, 4/5 involving RLE, and 3/5 throughout RUE.  Psychiatric: Alert and oriented to person and place only. Pleasant and cooperative.    Labs and Imaging on Admission: I have personally reviewed following labs and imaging studies  CBC: Recent Labs  Lab 09/27/19 0258  WBC 6.1  NEUTROABS 3.5  HGB 12.3  HCT 38.5  MCV 92.1  PLT 786   Basic Metabolic Panel: Recent Labs  Lab 09/27/19 0258  NA 140  K 4.3  CL 107  CO2 25  GLUCOSE 320*  BUN 32*  CREATININE 1.07*  CALCIUM 9.5   GFR: CrCl cannot be calculated (Unknown ideal weight.). Liver Function Tests: No results for input(s): AST, ALT, ALKPHOS, BILITOT, PROT, ALBUMIN in the last 168 hours. No results for input(s): LIPASE, AMYLASE in the last 168 hours. No results for input(s): AMMONIA in the last 168 hours. Coagulation  Profile: No results for input(s): INR, PROTIME in the last 168 hours. Cardiac Enzymes: No results for input(s): CKTOTAL, CKMB, CKMBINDEX, TROPONINI in the last 168 hours. BNP (last 3 results) No results for input(s): PROBNP in the last 8760 hours. HbA1C: No results for  input(s): HGBA1C in the last 72 hours. CBG: Recent Labs  Lab 09/27/19 0236  GLUCAP 299*   Lipid Profile: No results for input(s): CHOL, HDL, LDLCALC, TRIG, CHOLHDL, LDLDIRECT in the last 72 hours. Thyroid Function Tests: No results for input(s): TSH, T4TOTAL, FREET4, T3FREE, THYROIDAB in the last 72 hours. Anemia Panel: No results for input(s): VITAMINB12, FOLATE, FERRITIN, TIBC, IRON, RETICCTPCT in the last 72 hours. Urine analysis:    Component Value Date/Time   COLORURINE YELLOW 09/27/2019 0352   APPEARANCEUR CLEAR 09/27/2019 0352   LABSPEC 1.017 09/27/2019 0352   PHURINE 5.0 09/27/2019 0352   GLUCOSEU >=500 (A) 09/27/2019 0352   GLUCOSEU NEGATIVE 11/02/2018 1122   HGBUR NEGATIVE 09/27/2019 0352   BILIRUBINUR NEGATIVE 09/27/2019 0352   KETONESUR NEGATIVE 09/27/2019 0352   PROTEINUR NEGATIVE 09/27/2019 0352   UROBILINOGEN 0.2 11/02/2018 1122   NITRITE NEGATIVE 09/27/2019 0352   LEUKOCYTESUR TRACE (A) 09/27/2019 0352   Sepsis Labs: @LABRCNTIP (procalcitonin:4,lacticidven:4) )No results found for this or any previous visit (from the past 240 hour(s)).   Radiological Exams on Admission: DG Chest 1 View  Result Date: 09/27/2019 CLINICAL DATA:  [place], short of breath, low back pain EXAM: CHEST  1 VIEW COMPARISON:  10/19/2013 FINDINGS: Single frontal view of the chest demonstrates postsurgical changes from median sternotomy. Cardiac silhouette is enlarged. There is central vascular congestion. Streaky bibasilar consolidation likely reflects atelectasis or scarring. No airspace disease, effusion, or pneumothorax. IMPRESSION: 1. Central vascular congestion. 2. Streaky bibasilar consolidation favor atelectasis or  scarring. Electronically Signed   By: Randa Ngo M.D.   On: 09/27/2019 01:55   DG Lumbar Spine Complete  Result Date: 09/27/2019 CLINICAL DATA:  [place] yesterday, low back pain EXAM: LUMBAR SPINE - COMPLETE 4+ VIEW COMPARISON:  07/21/2018 FINDINGS: Frontal, bilateral oblique, and lateral views of the lumbar spine are obtained. Stable postsurgical changes at L5/S1. Prominent spondylosis at L4/L5. Diffuse facet hypertrophy unchanged. No acute displaced fractures.  Alignment is stable. IMPRESSION: 1. No acute bony abnormality. 2. Stable degenerative and postsurgical changes at the lumbosacral junction. Electronically Signed   By: Randa Ngo M.D.   On: 09/27/2019 01:54   CT Head Wo Contrast  Result Date: 09/27/2019 CLINICAL DATA:  Multiple falls, dizziness EXAM: CT HEAD WITHOUT CONTRAST TECHNIQUE: Contiguous axial images were obtained from the base of the skull through the vertex without intravenous contrast. COMPARISON:  10/19/2013 FINDINGS: Brain: Evaluation limited by patient motion. No acute infarct or hemorrhage. Lateral ventricles and midline structures are unremarkable. No acute extra-axial fluid collections. No mass effect. Vascular: No hyperdense vessel or unexpected calcification. Skull: No acute displaced fracture. Chronic diffuse calvarial thickening with mixed lytic and sclerotic appearance may reflect sequela of hyperparathyroidism. This is stable. Sinuses/Orbits: No acute finding. Other: None IMPRESSION: Negative for acute intracranial pathology. Electronically Signed   By: Randa Ngo M.D.   On: 09/27/2019 02:24   CT Cervical Spine Wo Contrast  Result Date: 09/27/2019 CLINICAL DATA:  Multiple falls, neck pain EXAM: CT CERVICAL SPINE WITHOUT CONTRAST TECHNIQUE: Multidetector CT imaging of the cervical spine was performed without intravenous contrast. Multiplanar CT image reconstructions were also generated. COMPARISON:  None. FINDINGS: Alignment: Straightening of the cervical spine  likely due to multilevel spondylosis. Skull base and vertebrae: No acute displaced fractures. Soft tissues and spinal canal: No prevertebral fluid or swelling. No visible canal hematoma. Disc levels: There is extensive multilevel spondylosis and facet hypertrophy. Disc space narrowing most pronounced at C4-5, C5-6, C6-7, C7-T1, and T2/T3. Extensive multilevel facet hypertrophic changes are  seen at all levels. Erosive changes are seen along the left C3/C4 facet. Upper chest: Airways patent. Visualized portions of the lung apices are clear. Other: Reconstructed images demonstrate no additional findings. IMPRESSION: 1. Severe multilevel spondylosis and facet hypertrophy. 2. No acute displaced cervical spine fracture. Electronically Signed   By: Randa Ngo M.D.   On: 09/27/2019 02:27   DG Knee Complete 4 Views Right  Result Date: 09/27/2019 CLINICAL DATA:  [place] yesterday, inability to bear weight EXAM: RIGHT KNEE - COMPLETE 4+ VIEW COMPARISON:  01/16/2014 FINDINGS: Frontal, bilateral oblique, lateral views of the right knee are obtained. Postsurgical changes are seen from prior right hip arthroplasty as well as ORIF of the tibia. There are no acute displaced fractures. Alignment is anatomic. No joint effusion. Diffuse vascular calcifications. IMPRESSION: 1. Extensive postsurgical changes as above. No acute bony abnormality. Electronically Signed   By: Randa Ngo M.D.   On: 09/27/2019 01:58   DG Hip Unilat W or Wo Pelvis 2-3 Views Right  Result Date: 09/27/2019 CLINICAL DATA:  [place] yesterday, inability to bear weight, pain EXAM: DG HIP (WITH OR WITHOUT PELVIS) 2-3V RIGHT COMPARISON:  None FINDINGS: Frontal view of the pelvis as well as frontal and cross-table lateral views of the right hip are obtained. No acute displaced fractures. Alignment is anatomic. Symmetrical bilateral hip osteoarthritis. Postsurgical changes at the lumbosacral junction. The remainder of the bony pelvis is unremarkable.  IMPRESSION: 1. No acute fracture. 2. Symmetrical bilateral hip osteoarthritis. Electronically Signed   By: Randa Ngo M.D.   On: 09/27/2019 01:56    EKG: Independently reviewed. Sinus rhythm, PAC's.   Assessment/Plan   1. Frequent falls; right-sided weakness  - Presents with one month of increasingly frequent falls and right sided weakness  - No acute hemorrhage noted on head CT but further assessment limited by motion degradation  - Consult PT, atempt MRI brain - patient believes she can remain still if her back pain and anxiety are treated     2. Uncontrolled IDDM  - A1c was 14.5% in November  - Continue CBG checks and insulin    3. Hypertension  - BP elevated in ED, pain likely contributing  - Continue metoprolol, pain-control   4. CKD III  - SCr is 1.07 on admission, similar to priors  - Renally-dose medications, monitor    5. Asthma  - Stable, continue as-needed albuterol    DVT prophylaxis: Lovenox  Code Status: Full, confirmed with patient and daughter in ED  Family Communication: Daughter updated at bedside Disposition Plan: patient from home with frequent falls over the past month, is unable to manage medications on her own, but adamantly refuses to consider going elsewhere on discharge  Consults called: None  Admission status: Observation     Vianne Bulls, MD [facility] Pager: See [email or web address]  If 7AM-7PM, please contact the daytime attending [email or web address]  09/27/2019, 5:19 AM

## 2019-09-27 NOTE — Progress Notes (Signed)
I have reviewed the patient's thoracic and lumbar MRI.  The thoracic MRI demonstrates severe spinal stenosis at  T2-3.  The patient's lumbar MRI demonstrates she has a spondylolisthesis at L4-5 with Ray cages which are posteriorly displaced.  She has severe stenosis at L3-4 and L4-5.  I have discussed this with the patient.  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   She does however have significant neural compression in her thoracic and lumbar spine as well ,as described above , which can be addressed at a later date if she recovers well.  I have recommended that she proceed with a C3-4, C4-5 and C5-6 anterior cervical diskectomy, possible C4 and C5 corpectomy with anterior instrumentation and fusion.  I have previously described the surgery to her as well as the risks , particularly since she is not very healthy.  The alternative is to do nothing, in which case, she will likely continue to worsen and eventually become paralyzed.  She understands the situation, the risks of surgery and the risks of not doing surgery.  I have explained the situation and answered her questions to the best of my ability.  I offered to discuss the situation with her children.  She did not take me up on that offer. She declined surgery.   Please contact me if she changes her mind.

## 2019-09-27 NOTE — ED Provider Notes (Signed)
[facility] DEPT Provider Note   CSN: [id number] Arrival date & time: 09/26/19  2307     History Chief Complaint  - fall  Level 5 caveat due to dementia  Holly Weiss is a 79 y.o. female.  The history is provided by the patient.  Fall The problem has been gradually worsening. Pertinent negatives include no chest pain and no headaches. The symptoms are aggravated by walking. Nothing relieves the symptoms.      Patient history of dementia, chronic pain, diabetes presents with multiple falls.  Patient presents with daughter.  Patient lives at home with her son.  It is reported the patient had 4 falls in the past 24 hours.  Patient does not think she has hit her head.  She is having generalized weakness.  Daughter reports she has had a progressive decline over the past several weeks.  She is not on anticoagulation.  No new medicines  Patient reports neck pain and hip and back pain She denies any headache Past Medical History:  Diagnosis Date  . ANEMIA-NOS 01/18/2008  . Arthritis   . 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 ASTHMA, WITH ACUTE EXACERBATION 11/29/2008  . Bilateral carpal tunnel syndrome   . Chronic pain syndrome 01/21/2010  . Depression   . DIABETIC  RETINOPATHY 05/30/2007  . DM, UNCOMPLICATED, TYPE II, UNCONTROLLED 05/30/2007  . Edema of both legs    takes Lasix  . Femur fracture, right ([place])   . GERD (gastroesophageal reflux disease)   . 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 History of blood transfusion    spine surgery  . History of kidney stones   . HYPERLIPIDEMIA 05/30/2007  . HYPERTENSION 10/31/2007  . LOW BACK PAIN 01/18/2008  . Migraine headache   . MORBID OBESITY, HX OF 05/30/2007  . NEPHROLITHIASIS, HX OF 01/18/2008  . Numbness and tingling in hands   . Seasonal allergies   . Shortness of breath    with walking  . SHOULDER PAIN, BILATERAL 04/09/2009  . Spinal stenosis   . Urgency of urination     Patient Active Problem List   Diagnosis Date Noted  . Right shoulder pain 11/02/2018  . Right rotator cuff tear 11/02/2018  . Gait disorder 11/02/2018  . AKI (acute kidney injury) ([place]) 02/09/2018  . CKD (chronic kidney disease), stage III 09/24/2016  . Rash 09/24/2016  . Sudden right hearing loss 02/25/2016  . Vertigo 02/25/2016  . Multiple open wounds of lower leg 01/28/2016  . Pain of right heel 01/28/2016  . Dystonia 07/20/2014  . Tibial fracture 01/16/2014  . Cellulitis of leg, right 11/17/2013  . 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 Other dysphagia 11/04/2013  . DJD (degenerative joint disease) of knee 07/12/2013  . 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 Depression 02/14/2013  . Visual hallucinations 02/14/2013  . Pain in joint of right knee 02/14/2013  . Diabetes ([place])   . Neck pain, acute 07/21/2011  . Right knee pain 07/21/2011  . Encounter for well adult exam with abnormal findings 01/27/2011  . Edema 09/09/2010  . KNEE PAIN, BILATERAL 09/01/2010  . 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 Chronic pain syndrome 01/21/2010  . DYSPEPSIA 01/21/2010  . FATIGUE 01/21/2010  . DYSPNEA ON EXERTION 01/21/2010  . MYOCARDIAL PERFUSION SCAN, WITH STRESS TEST, ABNORMAL 05/01/2009  . SHOULDER PAIN, BILATERAL 04/09/2009  . CHEST PAIN 04/09/2009  . NAUSEA 04/09/2009  . JOINT EFFUSION, LEFT KNEE 11/29/2008  . Abdominal pain, epigastric 08/20/2008  . ANEMIA-NOS 01/18/2008  . Chronic  diastolic congestive heart failure ([place]) 01/18/2008  . Allergic rhinitis 01/18/2008  . LOW BACK PAIN 01/18/2008  . NEPHROLITHIASIS, HX OF 01/18/2008  . Essential hypertension 10/31/2007  . DIABETIC  RETINOPATHY 05/30/2007  . Hyperlipidemia 05/30/2007  . MIGRAINE HEADACHE 05/30/2007  . Unspecified glaucoma 05/30/2007  . Asthma 05/30/2007  . MORBID OBESITY, HX OF 05/30/2007    Past Surgical History:  Procedure Laterality Date  . BACK SURGERY    . CATARACT EXTRACTION    . CHOLECYSTECTOMY    . lumbar disc surgury    . ORIF TIBIA  FRACTURE Right 01/16/2014   Procedure: RIGHT TIBIA REPAIR NON-UNION/MALUNION TIBIA WITH SLIDING GRAFT;  Surgeon: Renette Butters, MD;  Location: [facility];  Service: Orthopedics;  Laterality: Right;  . THORACIC FUSION    . TOTAL KNEE ARTHROPLASTY WITH REVISION COMPONENTS Right 07/12/2013   Procedure: TOTAL KNEE ARTHROPLASTY WITH TIBIA REVISION COMPONENTS;  Surgeon: Ninetta Lights, MD;  Location: [facility];  Service: Orthopedics;  Laterality: Right;  . TUBAL LIGATION       OB History    Gravida  5   Para  5   Term      Preterm      AB  0   Living  5     SAB      TAB      Ectopic      Multiple      Live Births              Family History  Problem Relation Age of Onset  . Diabetes Mother   . Heart disease Mother   . Dementia Mother   . Lung cancer Father   . Heart disease Father   . Diabetes Sister   . Depression Sister     Social History   Tobacco Use  . Smoking status: Never Smoker  . Smokeless tobacco: Current User  Substance Use Topics  . Alcohol use: No  . Drug use: No    Home Medications Prior to Admission medications   Medication Sig Start Date End Date Taking? Authorizing Provider  albuterol (VENTOLIN HFA) 108 (90 Base) MCG/ACT inhaler INHALE 2 PUFFS INTO THE LUNGS TWICE DAILY AS NEEDED FOR WHEEZING OR SHORTNESS OF BREATH Patient taking differently: Inhale 2 puffs into the lungs 2 (two) times daily as needed for wheezing or shortness of breath.  04/05/18  Yes Biagio Borg, MD  aspirin 81 MG tablet Take 1 tablet (81 mg total) by mouth daily. 01/17/14  Yes Renette Butters, MD  Calcium Carbonate-Vitamin D (CALTRATE 600+D PO) Take 2 tablets by mouth daily.   Yes [provider]  furosemide (LASIX) 40 MG tablet 1 tab by mouth once daily Patient taking differently: Take 20 mg by mouth daily as needed for fluid.  02/14/19  Yes Biagio Borg, MD  Insulin Glargine (LANTUS SOLOSTAR) 100 UNIT/ML Solostar Pen ADMINISTER 90 UNITS UNDER THE SKIN  DAILY Patient taking differently: Inject 70 Units into the skin daily.  08/10/19  Yes Biagio Borg, MD  insulin lispro (HUMALOG KWIKPEN) 100 UNIT/ML KwikPen ADMINISTER 15 UNITS UNDER THE SKIN EVERY EVENING AT [facility] Patient taking differently: Inject 15 Units into the skin every evening.  06/19/19  Yes Biagio Borg, MD  metoprolol succinate (TOPROL-XL) 100 MG 24 hr tablet TAKE 1 TABLET BY MOUTH EVERY DAY WITH OR IMMEDIATELY FOLLOWING A MEAL Patient taking differently: Take 100 mg by mouth daily. WITH OR IMMEDIATELY FOLLOWING A MEAL 05/01/19  Yes Cathlean Cower  W, MD  Multiple Vitamins-Minerals (ONE-A-DAY EXTRAS ANTIOXIDANT PO) Take 1 tablet by mouth daily.    Yes [provider]  tiZANidine (ZANAFLEX) 2 MG tablet Take 1 tablet (2 mg total) by mouth every 6 (six) hours as needed for muscle spasms. Patient taking differently: Take 2 mg by mouth at bedtime.  09/01/19  Yes Biagio Borg, MD  B-D ULTRAFINE III SHORT PEN 31G X 8 MM MISC USE TO CHECK BLOOD SUGERS TWICE DAILY Patient not taking: Reported on 09/15/2019 03/02/19   Biagio Borg, MD  Blood Glucose Monitoring Suppl ([place]) w/Device KIT Use as directed once daily to check blood sugar.  Diagnosis code 250.02 Patient not taking: Reported on 09/15/2019 02/25/16   Biagio Borg, MD  Continuous Blood Gluc Receiver (FREESTYLE LIBRE 14 DAY READER) DEVI Apply 1 Device topically daily. Use as directed daily E11.9 Patient not taking: Reported on 09/15/2019 01/06/18   Biagio Borg, MD  Continuous Blood Gluc Sensor (FREESTYLE LIBRE 14 DAY SENSOR) MISC Apply 1 Device topically every 14 (fourteen) days. E11.9 Patient not taking: Reported on 09/15/2019 01/06/18   Biagio Borg, MD  diclofenac sodium (VOLTAREN) 1 % GEL Apply 2 g topically 4 (four) times daily. To affected joint. Patient not taking: Reported on 09/19/2019 11/17/18   Lyndal Pulley, DO  glucose blood (ONE TOUCH TEST STRIPS) test strip Use as directed once daily to check blood sugar.   Diagnosis code E11.9 Patient not taking: Reported on 09/19/2019 02/25/16   Biagio Borg, MD  Insulin Pen Needle (B-D ULTRAFINE III SHORT PEN) 31G X 8 MM MISC USE TO CHECK BLOOD SUGERS TWICE DAILY Patient not taking: Reported on 09/19/2019 06/28/17   Biagio Borg, MD  Magnesium 250 MG TABS Take 1 tablet (250 mg total) by mouth daily. Patient not taking: Reported on 09/19/2019 02/25/16   Biagio Borg, MD  ONE [facility] LANCETS MISC Use as directed once daily to check blood sugar. DX E11.09 Patient not taking: Reported on 09/19/2019 06/28/17   Biagio Borg, MD  potassium chloride (K-DUR) 10 MEQ tablet TAKE 1 TABLET BY MOUTH DAILY,(*ONLY ON DAY THE LASIX IS TAKEN*) Patient not taking: Reported on 09/19/2019 02/09/18   Biagio Borg, MD  QUEtiapine (SEROQUEL) 100 MG tablet Take 1 tablet (100 mg total) by mouth 2 (two) times daily. Patient not taking: Reported on 09/19/2019 02/14/19   Biagio Borg, MD  rosuvastatin (CRESTOR) 20 MG tablet TAKE 1/2 TABLET BY MOUTH EVERY DAY Patient not taking: Reported on 09/19/2019 08/11/19   Biagio Borg, MD  traMADol (ULTRAM) 50 MG tablet Take 1 tablet (50 mg total) by mouth every 6 (six) hours as needed. Patient not taking: Reported on 09/19/2019 07/21/18   Biagio Borg, MD  triamcinolone (NASACORT) 55 MCG/ACT AERO nasal inhaler Place 2 sprays into the nose daily. Patient not taking: Reported on 09/27/2019 02/09/18   Biagio Borg, MD    Allergies    Adhesive [tape], Endocet [oxycodone-acetaminophen], Latex, and Oxycodone-acetaminophen  Review of Systems   Review of Systems  Unable to perform ROS: Dementia  Cardiovascular: Negative for chest pain.  Neurological: Negative for headaches.    Physical Exam Updated Vital Signs BP (!) 154/111   Pulse 62   Temp 98.2 F (36.8 C) (Oral)   Resp 20   SpO2 100%   Physical Exam CONSTITUTIONAL: Elderly, no acute distress HEAD: Normocephalic/atraumatic EYES: EOMI/PERRL ENMT: Mucous membranes moist, no signs of  trauma NECK: supple  no meningeal signs SPINE/BACK: Diffuse cervical, thoracic, lumbar tenderness, no bruising/crepitance/stepoffs noted to spine CV: S1/S2 noted, no murmurs/rubs/gallops noted LUNGS: Lungs are clear to auscultation bilaterally, no apparent distress ABDOMEN: soft, nontender  NEURO: Pt is awake/alert moves all extremitiesx4.  No facial droop.  Exam limited due to pain EXTREMITIES: pulses normal/equal, full ROM.  No deformities.  Significant tenderness with range of motion of right hip.  Tenderness to right knee. All other extremities/joints palpated/ranged and nontender Right foot is warm to touch.  Left foot is cool to touch.  I'm able to Doppler pulse in both feet, doppler pulse in both wrists No signs of cellulitis. SKIN: warm, color normal PSYCH: no abnormalities of mood noted, alert and oriented to situation  ED Results / Procedures / Treatments   Labs (all labs ordered are listed, but only abnormal results are displayed) Labs Reviewed  BASIC METABOLIC PANEL - Abnormal; Notable for the following components:      Result Value   Glucose, Bld 320 (*)    BUN 32 (*)    Creatinine, Ser 1.07 (*)    GFR calc non Af Amer 50 (*)    GFR calc Af Amer 58 (*)    All other components within normal limits  URINALYSIS, ROUTINE W REFLEX MICROSCOPIC - Abnormal; Notable for the following components:   Glucose, UA >=500 (*)    Leukocytes,Ua TRACE (*)    All other components within normal limits  CBG MONITORING, ED - Abnormal; Notable for the following components:   Glucose-Capillary 299 (*)    All other components within normal limits  SARS CORONAVIRUS 2 (TAT 6-24 HRS)  CBC WITH DIFFERENTIAL/PLATELET    EKG EKG Interpretation  Date/Time:  Wednesday September 27 2019 02:30:05 EST Ventricular Rate:  56 PR Interval:    QRS Duration: 104 QT Interval:  468 QTC Calculation: 452 R Axis:   66 Text Interpretation: Sinus rhythm Atrial premature complexes No significant change since  last tracing Confirmed by Ripley Fraise [phone]) on 09/27/2019 2:38:23 AM   Radiology DG Chest 1 View  Result Date: 09/27/2019 CLINICAL DATA:  [place], short of breath, low back pain EXAM: CHEST  1 VIEW COMPARISON:  10/19/2013 FINDINGS: Single frontal view of the chest demonstrates postsurgical changes from median sternotomy. Cardiac silhouette is enlarged. There is central vascular congestion. Streaky bibasilar consolidation likely reflects atelectasis or scarring. No airspace disease, effusion, or pneumothorax. IMPRESSION: 1. Central vascular congestion. 2. Streaky bibasilar consolidation favor atelectasis or scarring. Electronically Signed   By: Randa Ngo M.D.   On: 09/27/2019 01:55   DG Lumbar Spine Complete  Result Date: 09/27/2019 CLINICAL DATA:  [place] yesterday, low back pain EXAM: LUMBAR SPINE - COMPLETE 4+ VIEW COMPARISON:  07/21/2018 FINDINGS: Frontal, bilateral oblique, and lateral views of the lumbar spine are obtained. Stable postsurgical changes at L5/S1. Prominent spondylosis at L4/L5. Diffuse facet hypertrophy unchanged. No acute displaced fractures.  Alignment is stable. IMPRESSION: 1. No acute bony abnormality. 2. Stable degenerative and postsurgical changes at the lumbosacral junction. Electronically Signed   By: Randa Ngo M.D.   On: 09/27/2019 01:54   CT Head Wo Contrast  Result Date: 09/27/2019 CLINICAL DATA:  Multiple falls, dizziness EXAM: CT HEAD WITHOUT CONTRAST TECHNIQUE: Contiguous axial images were obtained from the base of the skull through the vertex without intravenous contrast. COMPARISON:  10/19/2013 FINDINGS: Brain: Evaluation limited by patient motion. No acute infarct or hemorrhage. Lateral ventricles and midline structures are unremarkable. No acute extra-axial fluid collections. No mass effect. Vascular:  No hyperdense vessel or unexpected calcification. Skull: No acute displaced fracture. Chronic diffuse calvarial thickening with mixed lytic and sclerotic  appearance may reflect sequela of hyperparathyroidism. This is stable. Sinuses/Orbits: No acute finding. Other: None IMPRESSION: Negative for acute intracranial pathology. Electronically Signed   By: Randa Ngo M.D.   On: 09/27/2019 02:24   CT Cervical Spine Wo Contrast  Result Date: 09/27/2019 CLINICAL DATA:  Multiple falls, neck pain EXAM: CT CERVICAL SPINE WITHOUT CONTRAST TECHNIQUE: Multidetector CT imaging of the cervical spine was performed without intravenous contrast. Multiplanar CT image reconstructions were also generated. COMPARISON:  None. FINDINGS: Alignment: Straightening of the cervical spine likely due to multilevel spondylosis. Skull base and vertebrae: No acute displaced fractures. Soft tissues and spinal canal: No prevertebral fluid or swelling. No visible canal hematoma. Disc levels: There is extensive multilevel spondylosis and facet hypertrophy. Disc space narrowing most pronounced at C4-5, C5-6, C6-7, C7-T1, and T2/T3. Extensive multilevel facet hypertrophic changes are seen at all levels. Erosive changes are seen along the left C3/C4 facet. Upper chest: Airways patent. Visualized portions of the lung apices are clear. Other: Reconstructed images demonstrate no additional findings. IMPRESSION: 1. Severe multilevel spondylosis and facet hypertrophy. 2. No acute displaced cervical spine fracture. Electronically Signed   By: Randa Ngo M.D.   On: 09/27/2019 02:27   DG Knee Complete 4 Views Right  Result Date: 09/27/2019 CLINICAL DATA:  [place] yesterday, inability to bear weight EXAM: RIGHT KNEE - COMPLETE 4+ VIEW COMPARISON:  01/16/2014 FINDINGS: Frontal, bilateral oblique, lateral views of the right knee are obtained. Postsurgical changes are seen from prior right hip arthroplasty as well as ORIF of the tibia. There are no acute displaced fractures. Alignment is anatomic. No joint effusion. Diffuse vascular calcifications. IMPRESSION: 1. Extensive postsurgical changes as above. No  acute bony abnormality. Electronically Signed   By: Randa Ngo M.D.   On: 09/27/2019 01:58   DG Hip Unilat W or Wo Pelvis 2-3 Views Right  Result Date: 09/27/2019 CLINICAL DATA:  [place] yesterday, inability to bear weight, pain EXAM: DG HIP (WITH OR WITHOUT PELVIS) 2-3V RIGHT COMPARISON:  None FINDINGS: Frontal view of the pelvis as well as frontal and cross-table lateral views of the right hip are obtained. No acute displaced fractures. Alignment is anatomic. Symmetrical bilateral hip osteoarthritis. Postsurgical changes at the lumbosacral junction. The remainder of the bony pelvis is unremarkable. IMPRESSION: 1. No acute fracture. 2. Symmetrical bilateral hip osteoarthritis. Electronically Signed   By: Randa Ngo M.D.   On: 09/27/2019 01:56    Procedures Procedures    Medications Ordered in ED Medications  fentaNYL (SUBLIMAZE) injection 50 mcg (50 mcg Intravenous Given 09/27/19 0215)  fentaNYL (SUBLIMAZE) injection 50 mcg (50 mcg Intravenous Given 09/27/19 0345)    ED Course  I have reviewed the triage vital signs and the nursing notes.  Pertinent labs & imaging results that were available during my care of the patient were reviewed by me and considered in my medical decision making (see chart for details).    MDM Rules/Calculators/A&P                      1:36 AM Patient with history of dementia here for frequent falls.  This appears to be accelerated over the past several weeks. Imaging and labs have been ordered 2:40 AM Imaging negative Labs pending 4:44 AM Labs revealed mild dehydration/hyperglycemia.  No anion gap No acute traumatic injury by CT and x-ray imaging On further discussion with  patient and family, patient has had significant decline over the past 1 to 2 weeks She is essentially unable to walk at all on her own without falling.  Patient denies any dizziness, but does report generalized weakness. ?  Right arm drift.  Unclear acuity Patient has difficulty  moving her right leg, but this is chronic due to previous surgeries. She also appears to have decreased handgrip bilaterally as well as weakness with wrist flexion/extension.  It is still unclear how acute this is, but given history of frequent falls, neck pain history of spondylosis on CT imaging, she could have an occult central cord syndrome I have ordered an [employer] account planner for now. I feel admission for work-up including MRI brain and C-spine is warranted.  Patient and family agree with plan.  Discussed with Dr. Myna Hidalgo for admission  tPA in stroke considered but not given due to: Onset over 3-4.5hours   Final Clinical Impression(s) / ED Diagnoses Final diagnoses:  Weakness  Hyperglycemia  AKI (acute kidney injury) ([place])  Hip strain, right, initial encounter  Concussion with loss of consciousness, initial encounter  Acute cervical myofascial strain, initial encounter    Rx / DC Orders ED Discharge Orders    None       Ripley Fraise, MD 09/27/19 [phone]

## 2019-09-27 NOTE — ED Notes (Signed)
[place] applied to patient to obtain UA

## 2019-09-28 ENCOUNTER — Other Ambulatory Visit: Payer: Medicare Other | Admitting: Internal Medicine

## 2019-09-28 DIAGNOSIS — N179 Acute kidney failure, unspecified: Secondary | ICD-10-CM | POA: Diagnosis present

## 2019-09-28 DIAGNOSIS — G894 Chronic pain syndrome: Secondary | ICD-10-CM | POA: Diagnosis present

## 2019-09-28 DIAGNOSIS — I5032 Chronic diastolic (congestive) heart failure: Secondary | ICD-10-CM | POA: Diagnosis present

## 2019-09-28 DIAGNOSIS — M2550 Pain in unspecified joint: Secondary | ICD-10-CM | POA: Diagnosis not present

## 2019-09-28 DIAGNOSIS — E11319 Type 2 diabetes mellitus with unspecified diabetic retinopathy without macular edema: Secondary | ICD-10-CM | POA: Diagnosis present

## 2019-09-28 DIAGNOSIS — Z515 Encounter for palliative care: Secondary | ICD-10-CM

## 2019-09-28 DIAGNOSIS — M48061 Spinal stenosis, lumbar region without neurogenic claudication: Secondary | ICD-10-CM | POA: Diagnosis present

## 2019-09-28 DIAGNOSIS — R296 Repeated falls: Secondary | ICD-10-CM | POA: Diagnosis not present

## 2019-09-28 DIAGNOSIS — Z794 Long term (current) use of insulin: Secondary | ICD-10-CM | POA: Diagnosis not present

## 2019-09-28 DIAGNOSIS — M6281 Muscle weakness (generalized): Secondary | ICD-10-CM | POA: Diagnosis present

## 2019-09-28 DIAGNOSIS — M4802 Spinal stenosis, cervical region: Secondary | ICD-10-CM | POA: Diagnosis present

## 2019-09-28 DIAGNOSIS — I130 Hypertensive heart and chronic kidney disease with heart failure and stage 1 through stage 4 chronic kidney disease, or unspecified chronic kidney disease: Secondary | ICD-10-CM | POA: Diagnosis present

## 2019-09-28 DIAGNOSIS — S14129A Central cord syndrome at unspecified level of cervical spinal cord, initial encounter: Secondary | ICD-10-CM | POA: Diagnosis present

## 2019-09-28 DIAGNOSIS — F0390 Unspecified dementia without behavioral disturbance: Secondary | ICD-10-CM | POA: Diagnosis present

## 2019-09-28 DIAGNOSIS — R402411 Glasgow coma scale score 13-15, in the field [EMT or ambulance]: Secondary | ICD-10-CM | POA: Diagnosis not present

## 2019-09-28 DIAGNOSIS — J9811 Atelectasis: Secondary | ICD-10-CM | POA: Diagnosis present

## 2019-09-28 DIAGNOSIS — E1122 Type 2 diabetes mellitus with diabetic chronic kidney disease: Secondary | ICD-10-CM | POA: Diagnosis present

## 2019-09-28 DIAGNOSIS — E1165 Type 2 diabetes mellitus with hyperglycemia: Secondary | ICD-10-CM | POA: Diagnosis present

## 2019-09-28 DIAGNOSIS — M47812 Spondylosis without myelopathy or radiculopathy, cervical region: Secondary | ICD-10-CM | POA: Diagnosis present

## 2019-09-28 DIAGNOSIS — F329 Major depressive disorder, single episode, unspecified: Secondary | ICD-10-CM | POA: Diagnosis present

## 2019-09-28 DIAGNOSIS — S060X9A Concussion with loss of consciousness of unspecified duration, initial encounter: Secondary | ICD-10-CM | POA: Diagnosis present

## 2019-09-28 DIAGNOSIS — W19XXXA Unspecified fall, initial encounter: Secondary | ICD-10-CM | POA: Diagnosis present

## 2019-09-28 DIAGNOSIS — J45909 Unspecified asthma, uncomplicated: Secondary | ICD-10-CM | POA: Diagnosis present

## 2019-09-28 DIAGNOSIS — M4316 Spondylolisthesis, lumbar region: Secondary | ICD-10-CM | POA: Diagnosis present

## 2019-09-28 DIAGNOSIS — Z20822 Contact with and (suspected) exposure to covid-19: Secondary | ICD-10-CM | POA: Diagnosis present

## 2019-09-28 DIAGNOSIS — W06XXXA Fall from bed, initial encounter: Secondary | ICD-10-CM | POA: Diagnosis present

## 2019-09-28 DIAGNOSIS — Z7401 Bed confinement status: Secondary | ICD-10-CM | POA: Diagnosis not present

## 2019-09-28 DIAGNOSIS — M4804 Spinal stenosis, thoracic region: Secondary | ICD-10-CM | POA: Diagnosis present

## 2019-09-28 DIAGNOSIS — N1831 Chronic kidney disease, stage 3a: Secondary | ICD-10-CM | POA: Diagnosis present

## 2019-09-28 DIAGNOSIS — Y92003 Bedroom of unspecified non-institutional (private) residence as the place of occurrence of the external cause: Secondary | ICD-10-CM | POA: Diagnosis not present

## 2019-09-28 DIAGNOSIS — R0902 Hypoxemia: Secondary | ICD-10-CM | POA: Diagnosis not present

## 2019-09-28 LAB — CBC
HCT: 33.6 % — ABNORMAL LOW (ref 36.0–46.0)
Hemoglobin: 10.6 g/dL — ABNORMAL LOW (ref 12.0–15.0)
MCH: 29.8 pg (ref 26.0–34.0)
MCHC: 31.5 g/dL (ref 30.0–36.0)
MCV: 94.4 fL (ref 80.0–100.0)
Platelets: 151 10*3/uL (ref 150–400)
RBC: 3.56 MIL/uL — ABNORMAL LOW (ref 3.87–5.11)
RDW: 15.4 % (ref 11.5–15.5)
WBC: 5.5 10*3/uL (ref 4.0–10.5)
nRBC: 0 % (ref 0.0–0.2)

## 2019-09-28 LAB — COMPREHENSIVE METABOLIC PANEL
ALT: 17 U/L (ref 0–44)
AST: 19 U/L (ref 15–41)
Albumin: 3 g/dL — ABNORMAL LOW (ref 3.5–5.0)
Alkaline Phosphatase: 76 U/L (ref 38–126)
Anion gap: 8 (ref 5–15)
BUN: 35 mg/dL — ABNORMAL HIGH (ref 8–23)
CO2: 25 mmol/L (ref 22–32)
Calcium: 9.1 mg/dL (ref 8.9–10.3)
Chloride: 111 mmol/L (ref 98–111)
Creatinine, Ser: 1.02 mg/dL — ABNORMAL HIGH (ref 0.44–1.00)
GFR calc Af Amer: >60 mL/min (ref >60)
GFR calc non Af Amer: 53 mL/min — ABNORMAL LOW (ref >60)
Glucose, Bld: 81 mg/dL (ref 70–99)
Potassium: 4.3 mmol/L (ref 3.5–5.1)
Sodium: 144 mmol/L (ref 135–145)
Total Bilirubin: 0.7 mg/dL (ref 0.3–1.2)
Total Protein: 6.5 g/dL (ref 6.5–8.1)

## 2019-09-28 LAB — HEMOGLOBIN A1C
Hgb A1c MFr Bld: 10.4 % — ABNORMAL HIGH (ref 4.8–5.6)
Mean Plasma Glucose: 251.78 mg/dL

## 2019-09-28 LAB — GLUCOSE, CAPILLARY
Glucose-Capillary: 57 mg/dL — ABNORMAL LOW (ref 70–99)
Glucose-Capillary: 96 mg/dL (ref 70–99)
Glucose-Capillary: 126 mg/dL — ABNORMAL HIGH (ref 70–99)
Glucose-Capillary: 191 mg/dL — ABNORMAL HIGH (ref 70–99)
Glucose-Capillary: 153 mg/dL — ABNORMAL HIGH (ref 70–99)

## 2019-09-28 MED ORDER — ADULT MULTIVITAMIN W/MINERALS CH
1.0000 | ORAL_TABLET | Freq: Every day | ORAL | Status: DC
  Administered 2019-09-28: 1 via ORAL
  Filled 2019-09-28: qty 1

## 2019-09-28 MED ORDER — INSULIN GLARGINE 100 UNIT/ML ~~LOC~~ SOLN
20.0000 [IU] | Freq: Every day | SUBCUTANEOUS | Status: DC
  Administered 2019-09-28: 20 [IU] via SUBCUTANEOUS
  Filled 2019-09-28 (×2): qty 0.2

## 2019-09-28 MED ORDER — BOOST / RESOURCE BREEZE PO LIQD CUSTOM: 1.0000 | ORAL | Status: DC

## 2019-09-28 MED ORDER — LORAZEPAM 2 MG/ML IJ SOLN
0.5000 mg | Freq: Once | INTRAMUSCULAR | Status: DC
  Filled 2019-09-28: qty 1

## 2019-09-28 MED ORDER — PRO-STAT SUGAR FREE PO LIQD
30.0000 mL | Freq: Every day | ORAL | Status: DC
  Administered 2019-09-28: 30 mL via ORAL
  Filled 2019-09-28: qty 30

## 2019-09-28 MED ORDER — QUETIAPINE FUMARATE 100 MG PO TABS
100.0000 mg | ORAL_TABLET | Freq: Once | ORAL | Status: AC
  Administered 2019-09-28: 100 mg via ORAL
  Filled 2019-09-28: qty 1

## 2019-09-28 MED ORDER — ALPRAZOLAM 0.25 MG PO TABS: 0.2500 mg | ORAL_TABLET | Freq: Once | ORAL | Status: DC

## 2019-09-28 MED ORDER — ENSURE ENLIVE PO LIQD
237.0000 mL | Freq: Two times a day (BID) | ORAL | Status: DC
  Administered 2019-09-28: 237 mL via ORAL

## 2019-09-28 NOTE — Progress Notes (Signed)
Pt refusing bladder scan vehemently after no urine output so far this shift. Pt educated on risks of urinary retention. Pt states "I don't feel like I have to go. I don't HAVE to do anything" when staff attempt to help her. Pt also complaining of pain in her legs but refusing all pain interventions. Np [place] notified.

## 2019-09-28 NOTE — Evaluation (Signed)
Physical Therapy Evaluation Patient Details Name: Holly DEMEDEIROS MRN: [id number] DOB: Mar 10, 1941 Today's Date: 09/28/2019   History of Present Illness  Holly Weiss is a 79 y.o. female with medical history significant for uncontrolled insulin-dependent diabetes mellitus, chronic diastolic CHF, hypertension, asthma, chronic pain, and dementia, now presenting to the emergency department for evaluation of frequent falls with diffuse aches and pains and right-sided weakness.  Patient reports that a son lives with her and helps when he can but she mainly cares for herself.  She reports increasingly frequent falls over the past month, denies loss of consciousness, but reports increase in her chronic back, neck, knee, hip, and ankle pain.  She also reports right-sided weakness that has failed to improve over the past month, most notable in her right arm.  She is right-handed and states that she is no longer able to write due to this weakness.  MR brain negative.  MR C-spine notable for severe cervical spinal canal stenosis with cord compression at C3-4, C4-5, T2-3.  MRI thoracic and lumbar spine with severe spinal stenosis at T2-3, L 3-4 and L4-5 with spondylolisthesis at L4-5 with ray cages. Pt declining surgery at this time  Clinical Impression  Pt admitted with above diagnosis.  Pt currently with functional limitations due to the deficits listed below (see PT Problem List). Pt will benefit from skilled PT to increase their independence and safety with mobility to allow discharge to the venue listed below.  Pt requiring max +2 to perform sit to stand with RW.  If pt continues to use RW, will need R platform due to decreased strength of R UE and poor grip.  Pt however will likely require w/c for safety mobility.  Discussed pt's performance with pt, and she does not have a clear understanding of her medical condition and presents with poor insight and unrealistic expectations, "when will I be able to use the arm  again?"  Pt believes she is going to d/c home with sister however requiring significant assist for mobility at this time.  Recommend SNF upon d/c. Pt also with edema in R UE so repositioned with pillows and educated pt on elevation.     Follow Up Recommendations SNF    Equipment Recommendations  Wheelchair cushion (measurements PT);Wheelchair (measurements PT);Hospital bed    Recommendations for Other Services       Precautions / Restrictions Precautions Precautions: Fall Precaution Comments: R UE edema, R hemiparesis Required Braces or Orthoses: Other Brace Other Brace: post op shoe for Right base fifth metatarsal fracture Restrictions Weight Bearing Restrictions: No      Mobility  Bed Mobility               General bed mobility comments: pt up in recliner on arrival  Transfers Overall transfer level: Needs assistance Equipment used: Rolling walker (2 wheeled) Transfers: Sit to/from Stand Sit to Stand: Max assist;+2 physical assistance         General transfer comment: verbal cues for positioning, therapist held right hand on RW (poor grip) and provided cues to use L side to self assist; assist required for rise and steady as well as controlling descent; performed twice; pt unable to lift feet or shift weight  Ambulation/Gait                Stairs            Wheelchair Mobility    Modified Rankin (Stroke Patients Only)       Balance  Standing balance-Leahy Scale: Zero                               Pertinent Vitals/Pain Pain Assessment: Faces Faces Pain Scale: Hurts even more Pain Location: R shoulder Pain Descriptors / Indicators: Discomfort Pain Intervention(s): Repositioned;Monitored during session    Home Living Family/patient expects to be discharged to:: Private residence Living Arrangements: Children Available Help at Discharge: Family Type of Home: House Home Access: Stairs to enter Entrance  Stairs-Rails: Left Entrance Stairs-Number of Steps: 3 Home Layout: Two level;Able to live on main level with bedroom/bathroom Home Equipment: Walker - 2 wheels;Grab bars - toilet;Grab bars - tub/shower;Shower seat;Walker - 4 wheels      Prior Function Level of Independence: Needs assistance   Gait / Transfers Assistance Needed: describes rollator however also mixing w/c and walker together when describing mobility however states she needs a w/c for home(rolling walker occassionally, assist from family )  ADL's / Homemaking Assistance Needed: (assist from son and daughter with ADL and IADL tasks)  Comments: Environmental consultant occassionally for mobilization      Hand Dominance   Dominant Hand: Right    Extremity/Trunk Assessment   Upper Extremity Assessment Upper Extremity Assessment: Defer to OT evaluation    Lower Extremity Assessment Lower Extremity Assessment: Generalized weakness;RLE deficits/detail RLE Deficits / Details: grossly observed 2+/5 strength       Communication   Communication: No difficulties  Cognition Arousal/Alertness: Awake/alert Behavior During Therapy: WFL for tasks assessed/performed Overall Cognitive Status: No family/caregiver present to determine baseline cognitive functioning                                 General Comments: A & O  to name and place only, not aware of time, short term and long term memory deficits noted; pt with poor understanding and insight into current mobility limitations being due to severe cervical stenosis      General Comments      Exercises     Assessment/Plan    PT Assessment Patient needs continued PT services  PT Problem List Decreased strength;Decreased mobility;Decreased safety awareness;Decreased knowledge of precautions;Decreased coordination;Pain;Decreased knowledge of use of DME;Decreased balance;Decreased activity tolerance       PT Treatment Interventions DME instruction;Therapeutic  exercise;Wheelchair mobility training;Gait training;Balance training;Functional mobility training;Therapeutic activities;Patient/family education;Neuromuscular re-education    PT Goals (Current goals can be found in the Care Plan section)  Acute Rehab PT Goals Patient Stated Goal: go home with her sister PT Goal Formulation: With patient Time For Goal Achievement: 10/12/19 Potential to Achieve Goals: Fair    Frequency Min 2X/week   Barriers to discharge        Co-evaluation               AM-PAC PT "6 Clicks" Mobility  Outcome Measure Help needed turning from your back to your side while in a flat bed without using bedrails?: Total Help needed moving from lying on your back to sitting on the side of a flat bed without using bedrails?: Total Help needed moving to and from a bed to a chair (including a wheelchair)?: Total Help needed standing up from a chair using your arms (e.g., wheelchair or bedside chair)?: Total Help needed to walk in hospital room?: Total Help needed climbing 3-5 steps with a railing? : Total 6 Click Score: 6    End of Session Equipment  Utilized During Treatment: Gait belt Activity Tolerance: Patient limited by fatigue Patient left: in chair;with call bell/phone within reach Nurse Communication: Mobility status PT Visit Diagnosis: Other symptoms and signs involving the nervous system (R29.898);Other abnormalities of gait and mobility (R26.89)    Time: 1030-1043 PT Time Calculation (min) (ACUTE ONLY): 13 min   Charges:   PT Evaluation $PT Eval Low Complexity: 1 Low        Kati PT, DPT Acute Rehabilitation Services Office: [phone]  Trena Platt 09/28/2019, 12:58 PM

## 2019-09-28 NOTE — Progress Notes (Signed)
Inpatient Diabetes Program Recommendations  AACE/ADA: New Consensus Statement on Inpatient Glycemic Control (2015)  Target Ranges:  Prepandial:   less than 140 mg/dL      Peak postprandial:   less than 180 mg/dL (1-2 hours)      Critically ill patients:  140 - 180 mg/dL   Lab Results  Component Value Date   GLUCAP 197 (H) 09/27/2019   HGBA1C 10.4 (H) 09/28/2019    Review of Glycemic Control  Diabetes history: DM2 Outpatient Diabetes medications: Lantus 70 units QD, Humalog 15 units at dinner meal Current orders for Inpatient glycemic control: Lantus 20 units QD, Novolog 0-9 units tidwc and 0-5 units QHS  HgbA1C - 10.4%. Down from 14.5% on 06/18/20 Had hypos on 2/24 before dinner of 51-62 mg/dL  Inpatient Diabetes Program Recommendations:     Agree with insulin titration. Will need to f/u with PCP regarding current home insulin dose, and make certain pt is not going low at home.  Will continue to follow.  Thank you. Lorenda Peck, RD, LDN, CDE Inpatient Diabetes Coordinator [phone]

## 2019-09-28 NOTE — Telephone Encounter (Signed)
Ok to stay off the meds listed for now  She also has listed seroquel 100 bid and apparently not taking  Is she willing now to take this?  If so, let me know

## 2019-09-28 NOTE — NC FL2 (Signed)
[place] LEVEL OF CARE SCREENING TOOL     IDENTIFICATION  Patient Name: Holly Weiss Birthdate: Aug 14, 1940 Sex: female Admission Date (Current Location): 09/27/2019  [facility] and [place] Number:  Herbalist and Address:  [facility],  [address] [place], [place]      Provider Number: [id number]  Attending Physician Name and Address:  [place] Eric J, DO  Relative Name and Phone Number:  Deirdre Peer dtr [phone]    Current Level of Care: Hospital Recommended Level of Care: [place] Prior Approval Number:    Date Approved/Denied:   PASRR Number: [id number]  Discharge Plan: SNF    Current Diagnoses: Patient Active Problem List   Diagnosis Date Noted  . Cervical stenosis of spinal canal 09/28/2019  . Frequent falls 09/27/2019  . Fracture of 5th metatarsal 09/27/2019  . Cervical spinal stenosis 09/27/2019  . Cord compression syndrome ([place]) 09/27/2019  . Right shoulder pain 11/02/2018  . Right rotator cuff tear 11/02/2018  . Gait disorder 11/02/2018  . AKI (acute kidney injury) ([place]) 02/09/2018  . CKD (chronic kidney disease), stage III 09/24/2016  . Rash 09/24/2016  . Sudden right hearing loss 02/25/2016  . Vertigo 02/25/2016  . Multiple open wounds of lower leg 01/28/2016  . Pain of right heel 01/28/2016  . Dystonia 07/20/2014  . Tibial fracture 01/16/2014  . Cellulitis of leg, right 11/17/2013  . 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 Other dysphagia 11/04/2013  . DJD (degenerative joint disease) of knee 07/12/2013  . 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 Depression 02/14/2013  . Visual hallucinations 02/14/2013  . Pain in joint of right knee 02/14/2013  . Uncontrolled diabetes mellitus with hyperglycemia, with long-term current use of insulin ([place])   . Neck pain, acute 07/21/2011  . Right knee pain 07/21/2011  . Encounter for well adult exam with abnormal findings 01/27/2011  . Edema 09/09/2010   . KNEE PAIN, BILATERAL 09/01/2010  . 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 Chronic pain syndrome 01/21/2010  . DYSPEPSIA 01/21/2010  . FATIGUE 01/21/2010  . DYSPNEA ON EXERTION 01/21/2010  . MYOCARDIAL PERFUSION SCAN, WITH STRESS TEST, ABNORMAL 05/01/2009  . SHOULDER PAIN, BILATERAL 04/09/2009  . CHEST PAIN 04/09/2009  . NAUSEA 04/09/2009  . JOINT EFFUSION, LEFT KNEE 11/29/2008  . Abdominal pain, epigastric 08/20/2008  . ANEMIA-NOS 01/18/2008  . Chronic diastolic congestive heart failure ([place]) 01/18/2008  . Allergic rhinitis 01/18/2008  . LOW BACK PAIN 01/18/2008  . NEPHROLITHIASIS, HX OF 01/18/2008  . Essential hypertension 10/31/2007  . DIABETIC  RETINOPATHY 05/30/2007  . Hyperlipidemia 05/30/2007  . MIGRAINE HEADACHE 05/30/2007  . Unspecified glaucoma 05/30/2007  . Asthma 05/30/2007  . MORBID OBESITY, HX OF 05/30/2007    Orientation RESPIRATION BLADDER Height & Weight     Self, Situation  Normal Continent Weight: 83 kg Height:  5\' 3"  (160 cm)  BEHAVIORAL SYMPTOMS/MOOD NEUROLOGICAL BOWEL NUTRITION STATUS      Continent Diet(CHO MOD)  AMBULATORY STATUS COMMUNICATION OF NEEDS Skin   Limited Assist   Normal                       Personal Care Assistance Level of Assistance  Bathing, Feeding, Dressing Bathing Assistance: Limited assistance Feeding assistance: Limited assistance Dressing Assistance: Limited assistance     Functional Limitations Info  Sight, Hearing, Speech Sight Info: Impaired(eyeglasses) Hearing Info: Adequate Speech Info: Impaired(Uppers)    SPECIAL CARE FACTORS FREQUENCY  PT (By licensed PT),  OT (By licensed OT)     PT Frequency: 5x week OT Frequency: 5x week            Contractures Contractures Info: Not present    Additional Factors Info  Code Status, Allergies, Insulin Sliding Scale Code Status Info: Full code Allergies Info: Adhesive Tape, Endocet Oxycodone-acetaminophen, Latex, Oxycodone-acetaminophen   Insulin Sliding  Scale Info: SSI       Current Medications (09/28/2019):  This is the current hospital active medication list Current Facility-Administered Medications  Medication Dose Route Frequency Provider Last Rate Last Admin  . 0.9 %  sodium chloride infusion  250 mL Intravenous PRN Opyd, Ilene Qua, MD      . acetaminophen (TYLENOL) tablet 650 mg  650 mg Oral Q6H PRN Opyd, Ilene Qua, MD       Or  . acetaminophen (TYLENOL) suppository 650 mg  650 mg Rectal Q6H PRN Opyd, Ilene Qua, MD      . albuterol (PROVENTIL) (2.5 MG/3ML) 0.083% nebulizer solution 2.5 mg  2.5 mg Nebulization Q6H PRN [place] Eric J, DO      . amLODipine (NORVASC) tablet 5 mg  5 mg Oral Daily [place] Eric J, DO   5 mg at 09/28/19 0957  . dextrose (GLUTOSE) 40 % oral gel 75 g  2 Tube Oral STAT [place] Eric J, DO      . diphenhydrAMINE (BENADRYL) capsule 25 mg  25 mg Oral Q8H PRN Opyd, Ilene Qua, MD      . feeding supplement (ENSURE ENLIVE) (ENSURE ENLIVE) liquid 237 mL  237 mL Oral BID BM [place] Eric J, DO   237 mL at 09/28/19 1425  . feeding supplement (PRO-STAT SUGAR FREE 64) liquid 30 mL  30 mL Oral Daily [place] Eric J, DO   30 mL at 09/28/19 1424  . HYDROcodone-acetaminophen (NORCO/VICODIN) 5-325 MG per tablet 1-2 tablet  1-2 tablet Oral Q4H PRN Opyd, Ilene Qua, MD   2 tablet at 09/27/19 0925  . insulin aspart (novoLOG) injection 0-5 Units  0-5 Units Subcutaneous QHS Opyd, Timothy S, MD      . insulin aspart (novoLOG) injection 0-9 Units  0-9 Units Subcutaneous TID WC Opyd, Ilene Qua, MD   1 Units at 09/28/19 1232  . insulin glargine (LANTUS) injection 20 Units  20 Units Subcutaneous Daily [place] Eric J, DO   20 Units at 09/28/19 1011  . ketorolac (TORADOL) 15 MG/ML injection 15 mg  15 mg Intravenous Q6H PRN Opyd, Ilene Qua, MD   15 mg at 09/27/19 1959  . LORazepam (ATIVAN) injection 1 mg  1 mg Intravenous Once PRN Opyd, Ilene Qua, MD      . metoprolol succinate (TOPROL-XL) 24 hr tablet 100 mg  100 mg Oral Daily Opyd, Ilene Qua, MD   100 mg at 09/28/19 0957  . multivitamin with minerals tablet 1 tablet  1 tablet Oral Daily [place] Eric J, DO   1 tablet at 09/28/19 1206  . ondansetron (ZOFRAN) tablet 4 mg  4 mg Oral Q6H PRN Opyd, Ilene Qua, MD       Or  . ondansetron (ZOFRAN) injection 4 mg  4 mg Intravenous Q6H PRN Opyd, Ilene Qua, MD      . polyethylene glycol (MIRALAX / GLYCOLAX) packet 17 g  17 g Oral Daily PRN Opyd, Ilene Qua, MD      . sodium chloride flush (NS) 0.9 % injection 3 mL  3 mL Intravenous Q12H Opyd, Ilene Qua, MD   3 mL at 09/27/19  1122  . sodium chloride flush (NS) 0.9 % injection 3 mL  3 mL Intravenous Q12H Opyd, Ilene Qua, MD   3 mL at 09/28/19 1006  . sodium chloride flush (NS) 0.9 % injection 3 mL  3 mL Intravenous PRN Opyd, Ilene Qua, MD      . tiZANidine (ZANAFLEX) tablet 2 mg  2 mg Oral Q8H PRN Opyd, Ilene Qua, MD   2 mg at 09/28/19 1004     Discharge Medications: Please see discharge summary for a list of discharge medications.  Relevant Imaging Results:  Relevant Lab Results:   Additional Information [id number], [place], [place]

## 2019-09-28 NOTE — TOC Progression Note (Signed)
Transition of Care [facility]) - Progression Note    Patient Details  Name: MONI WHILEY MRN: [id number] Date of Birth: Feb 07, 1941  Transition of Care [facility]) CM/SW Contact  Champagne Paletta, Juliann Pulse, RN Phone Number: 09/28/2019, 9:11 AM  Clinical Narrative:   CM referral for SNF.Await PT cons & recc.         Expected Discharge Plan and Services                                                 Social Determinants of Health (SDOH) Interventions    Readmission Risk Interventions No flowsheet data found.

## 2019-09-28 NOTE — Progress Notes (Signed)
Initial Nutrition Assessment  RD working remotely.   DOCUMENTATION CODES:   Obesity unspecified  INTERVENTION:  - will order 30 ml prostat once/day, each supplement provides 100 kcal and 15 grams protein.  - will order Ensure Enlive BID, each supplement provides 350 kcal and 20 grams of protein. - will order daily multivitamin with minerals.   NUTRITION DIAGNOSIS:   Increased nutrient needs related to acute illness as evidenced by estimated needs.  GOAL:   Patient will meet greater than or equal to 90% of their needs  MONITOR:   PO intake, Supplement acceptance, Labs, Weight trends  REASON FOR ASSESSMENT:   Malnutrition Screening Tool  ASSESSMENT:   79 y.o. female with medical history of uncontrolled insulin-dependent DM, CHF, HTN, asthma, chronic pain, and dementia. She presented to the ED for evaluation of frequent falls x1 month with diffuse aches and pains and R-sided weakness. Her son lives with her and helps when he can but she mainly cares for herself. CXR notable for vascular congestion and bibasilar atelectasis. Radiographs of the right knee, lumbar spine, hips and pelvis are notable for degenerative changes but no acute fracture.  Per flow sheet documentation, she consumed 50% of breakfast this AM (160 kcal, 8 grams protein). Patient reports that she had a good appetite with no discomfort when eating, no chewing or swallowing issues, and no issues with access to food. She is unable to stand to cook so relies on others or consumes pre-packaged or take out foods.   Patient is unsure of UBW or if she lost weight PTA. Weight yesterday was 183 lb and weight on 06/19/19 was 208 lb. This indicates 25 lb weight loss (12% body weight) in the past 3.5 months; significant for time frame. Suspect this is at least in part due to ongoing poorly controlled DM with persistent hyperglycemia  Per notes: - severe cervical spinal canal stenosis, severe thoracic spinal stenosis,  spondylolisthesis - pending PT eval; OT recommends SNF - R fifth metatarsal fx - poorly controlled type 2 DM--HgbA1c in November: 14.5% and is now 10% - stage 3 CKD   Labs reviewed; CBG: 126 mg/dl, BUN: 35 mg/dl, creatinine: 1.02 mg/dl, GFR: 53 ml/min. Medications reviewed; sliding scale novolog, 20 units lantus/day.    NUTRITION - FOCUSED PHYSICAL EXAM:  unable to complete at this time.   Diet Order:   Diet Order            Diet heart healthy/carb modified Room service appropriate? Yes; Fluid consistency: Thin  Diet effective now              EDUCATION NEEDS:   No education needs have been identified at this time  Skin:  Skin Assessment: Reviewed RN Assessment  Last BM:  PTA/unknown  Height:   Ht Readings from Last 1 Encounters:  09/27/19 5\' 3"  (1.6 m)    Weight:   Wt Readings from Last 1 Encounters:  09/27/19 83 kg    Ideal Body Weight:  52.3 kg  BMI:  Body mass index is 32.41 kg/m.  Estimated Nutritional Needs:   Kcal:  1675-1850 kcal  Protein:  80-90 grams  Fluid:  >/= 1.8 L/day     Jarome Matin, MS, RD, LDN, CNSC Inpatient Clinical Dietitian RD pager # available in [facility]  After hours/weekend pager # available in [facility]

## 2019-09-28 NOTE — TOC Progression Note (Signed)
Transition of Care [facility]) - Progression Note    Patient Details  Name: Holly Weiss MRN: [id number] Date of Birth: [birth year]  [facility]) CM/SW Contact  Jah Alarid, Juliann Pulse, RN Phone Number: 09/28/2019, 3:25 PM  Clinical Narrative:Faxed out to SNF per dtr Sonya's agreement-has gone to [facility] in past-informed [facility] rep-Leyland Kenna-await bed offers.            Expected Discharge Plan and Services                                                 Social Determinants of Health (SDOH) Interventions    Readmission Risk Interventions No flowsheet data found.

## 2019-09-28 NOTE — Plan of Care (Signed)
Patient tolerating carb mod diet.  Up to chair for majority of shift, up with 2 max assist.  Daughter, [name] at bedside for part of shift.

## 2019-09-28 NOTE — Progress Notes (Signed)
PROGRESS NOTE    [name]  OFB:[id number] DOB: 08/21/1940 DOA: 09/27/2019 PCP: Biagio Borg, MD    Brief Narrative:  Holly Weiss is a 79 y.o. female with medical history significant for uncontrolled insulin-dependent diabetes mellitus, chronic diastolic CHF, hypertension, asthma, chronic pain, and dementia, now presenting to the emergency department for evaluation of frequent falls with diffuse aches and pains and right-sided weakness.  Patient reports that a son lives with her and helps when he can but she mainly cares for herself.  She reports increasingly frequent falls over the past month, denies loss of consciousness, but reports increase in her chronic back, neck, knee, hip, and ankle pain.  She also reports right-sided weakness that has failed to improve over the past month, most notable in her right arm.  She is right-handed and states that she is no longer able to write due to this weakness.  She denies fevers, chills, chest pain, cough, or shortness of breath.  Upon arrival to the ED, patient is found to be afebrile, saturating well on room air, and with blood pressure 150/100.  EKG features sinus rhythm with PACs.  Chest x-ray notable for vascular congestion and bibasilar atelectasis.  Radiographs of the right knee, lumbar spine, hips and pelvis are notable for degenerative changes but no acute fracture.  No acute hemorrhages noted on motion degraded head CT.  Severe multilevel spondylosis is seen on cervical spine CT.  Chemistry panel features a glucose of 320 and creatinine 1.07.  CBC is unremarkable.  Patient was given IV fentanyl in the ED and Covid PCR screening test is pending.   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  CT head with no acute intracranial findings.  MR brain negative.  MR C-spine notable for severe cervical spinal canal stenosis with cord compression at C3-4, C4-5, T2-3.  MRI thoracic and lumbar spine with severe spinal stenosis at T2-3, L 3-4 and L4-5 with spondylolisthesis at L4-5 with ray cages.  Neurosurgery was consulted, Dr. Arnoldo Morale to evaluate the patient with recommendations of proceeding with C3-4, C4-5 and C 5-6 anterior cervical discectomy and possible C4 and C5 corpectomy with anterior instrumentation and fusion.  Patient currently declines surgical intervention and will consider further outpatient.  Patient understands that if she continues to do nothing, she will likely continue to worsen and eventually become paralyzed, in which she understands.  This was also discussed with patient's daughter, Davy Pique. --OT recommends SNF --Awaiting PT evaluation --Social work for SNF placement --Outpatient follow-up with neurosurgery, Dr. Arnoldo Morale  Right base fifth metatarsal fracture Discussed case with orthopedics, Dr. Lyla Glassing he recommended place patient in a hard soled postoperative shoe. --PT evaluation pending  Type 2 diabetes mellitus, poorly controlled Hemoglobin A1c 10.0 which is improved from  14.5 on 06/19/2019.  Taking Lantus 70 units subcutaneously daily, Humalog 15 units subcutaneously every afternoon at home. --Decreased Lantus dose to 20 units subcutaneously daily for poor  oral intake --Continue insulin sliding scale for further coverage --Diabetic educator following  Essential hypertension BP better controlled, 122/69 this morning --Continue metoprolol succinate 100 mg p.o. daily --Started on amlodipine 5 mg p.o. daily for poorly controlled hypertension  CKD stage IIIa Creatinine 0.9-1.1 past year.  Creatinine on admission 1.07, at baseline. --Continue monitor renal function closely  Asthma Stable, albuterol as needed   DVT prophylaxis: Lovenox; will hold Lovenox tonight for possible need of surgical intervention tomorrow; SCDs Code Status: Full code Family Communication: Updated patient's daughter, Davy Pique via telephone today Disposition Plan:      Patient is from: Home, lives with son     Anticipated Disposition:  SNF, case management for coordination     Barriers to discharge or conditions that needs to be met prior to discharge: PT evaluation, SNF placement per social work  Consultants:   Neurosurgery - Dr. Arnoldo Morale -signed off 09/27/2019 as patient declines surgical invention, outpatient follow-up  Procedures:   none  Antimicrobials:   none   Subjective: Patient seen and examined at bedside, resting comfortably.  Sitting in bedside chair.  Continues with right upper and lower extremity weakness.  Patient was seen by neurosurgery yesterday with recommendations of surgical intervention within the C-spine for likely impending paralyzation, patient currently declines.  OT recommends SNF.  Awaiting PT evaluation.  Updated patient's daughter via telephone.  No other complaints or concerns at this time.  Denies headache, no fever/chills/night sweats, no nausea/vomiting/diarrhea, no chest pain, palpitations, no shortness of breath, no abdominal pain.  No acute events overnight per nursing staff.  Objective: Vitals:   09/27/19 0936 09/27/19 1337 09/27/19 2028 09/28/19 0430  BP: (!) 179/53 106/73 (!) 126/42 122/69  Pulse: (!) 57 (!) 57 64 61   Resp: 16 14 18 18   Temp: (!) 97.4 F (36.3 C) (!) 97.5 F (36.4 C) 98.6 F (37 C) 99.7 F (37.6 C)  TempSrc: Oral Oral Oral Oral  SpO2: 100% 100% 100%   Weight:      Height:        Intake/Output Summary (Last 24 hours) at 09/28/2019 1128 Last data filed at 09/28/2019 0900 Gross per 24 hour  Intake 120 ml  Output 200 ml  Net -80 ml   Filed Weights   09/27/19 0931  Weight: 83 kg    Examination:  General exam: Appears calm and comfortable  Respiratory system: Clear to auscultation. Respiratory effort normal.  Oxygenating well on room air. Cardiovascular system: S1 & S2 heard, RRR. No JVD, murmurs, rubs, gallops or clicks. No pedal edema. Gastrointestinal system: Abdomen is nondistended, soft and nontender. No organomegaly or masses felt. Normal bowel sounds heard. Central nervous system: Alert and oriented.  Decreased muscle strength right upper/lower extremity with significant asymmetry to left, sensation to gross touch intact Extremities: Right upper and lower extremity strength 1/5 Skin: No rashes, lesions or ulcers Psychiatry: Judgement and insight appear poor.  Depressed mood & flat affect appropriate.     Data Reviewed: I have personally reviewed following labs and imaging studies  CBC: Recent Labs  Lab 09/27/19 0258 09/28/19 0440  WBC 6.1 5.5  NEUTROABS 3.5  --   HGB 12.3 10.6*  HCT 38.5 33.6*  MCV 92.1 94.4  PLT 163 809   Basic Metabolic Panel: Recent Labs  Lab 09/27/19 0258 09/28/19 0440  NA  140 144  K 4.3 4.3  CL 107 111  CO2 25 25  GLUCOSE 320* 81  BUN 32* 35*  CREATININE 1.07* 1.02*  CALCIUM 9.5 9.1   GFR: Estimated Creatinine Clearance: 46.4 mL/min (A) (by C-G formula based on SCr of 1.02 mg/dL (H)). Liver Function Tests: Recent Labs  Lab 09/28/19 0440  AST 19  ALT 17  ALKPHOS 76  BILITOT 0.7  PROT 6.5  ALBUMIN 3.0*   No results for input(s): LIPASE, AMYLASE in the last 168 hours. No results for input(s): AMMONIA in the last 168  hours. Coagulation Profile: No results for input(s): INR, PROTIME in the last 168 hours. Cardiac Enzymes: No results for input(s): CKTOTAL, CKMB, CKMBINDEX, TROPONINI in the last 168 hours. BNP (last 3 results) No results for input(s): PROBNP in the last 8760 hours. HbA1C: Recent Labs    09/27/19 0258 09/28/19 0440  HGBA1C 10.0* 10.4*   CBG: Recent Labs  Lab 09/27/19 1633 09/27/19 1645 09/27/19 1705 09/27/19 1903 09/27/19 2026  GLUCAP 51* 62* 98 167* 197*   Lipid Profile: No results for input(s): CHOL, HDL, LDLCALC, TRIG, CHOLHDL, LDLDIRECT in the last 72 hours. Thyroid Function Tests: No results for input(s): TSH, T4TOTAL, FREET4, T3FREE, THYROIDAB in the last 72 hours. Anemia Panel: No results for input(s): VITAMINB12, FOLATE, FERRITIN, TIBC, IRON, RETICCTPCT in the last 72 hours. Sepsis Labs: No results for input(s): PROCALCITON, LATICACIDVEN in the last 168 hours.  Recent Results (from the past 240 hour(s))  SARS CORONAVIRUS 2 (TAT 6-24 HRS) Nasopharyngeal Nasopharyngeal Swab     Status: None   Collection Time: 09/27/19  4:03 AM   Specimen: Nasopharyngeal Swab  Result Value Ref Range Status   SARS Coronavirus 2 NEGATIVE NEGATIVE Final    Comment: (NOTE) SARS-CoV-2 target nucleic acids are NOT DETECTED. The SARS-CoV-2 RNA is generally detectable in upper and lower respiratory specimens during the acute phase of infection. Negative results do not preclude SARS-CoV-2 infection, do not rule out co-infections with other pathogens, and should not be used as the sole basis for treatment or other patient management decisions. Negative results must be combined with clinical observations, patient history, and epidemiological information. The expected result is Negative. Fact Sheet for Patients: SugarRoll.be Fact Sheet for Healthcare Providers: https://www.woods-mathews.com/ This test is not yet approved or cleared by the [place] FDA and  has been authorized for detection and/or diagnosis of SARS-CoV-2 by FDA under an Emergency Use Authorization (EUA). This EUA will remain  in effect (meaning this test can be used) for the duration of the COVID-19 declaration under Section 56 4(b)(1) of the Act, 21 U.S.C. section 360bbb-3(b)(1), unless the authorization is terminated or revoked sooner. Performed at [facility], [place] [address] [place], [place] [address]          Radiology Studies: DG Chest 1 View  Result Date: 09/27/2019 CLINICAL DATA:  [place], short of breath, low back pain EXAM: CHEST  1 VIEW COMPARISON:  10/19/2013 FINDINGS: Single frontal view of the chest demonstrates postsurgical changes from median sternotomy. Cardiac silhouette is enlarged. There is central vascular congestion. Streaky bibasilar consolidation likely reflects atelectasis or scarring. No airspace disease, effusion, or pneumothorax. IMPRESSION: 1. Central vascular congestion. 2. Streaky bibasilar consolidation favor atelectasis or scarring. Electronically Signed   By: Randa Ngo M.D.   On: 09/27/2019 01:55   DG Lumbar Spine Complete  Result Date: 09/27/2019 CLINICAL DATA:  [place] yesterday, low back pain EXAM: LUMBAR SPINE - COMPLETE 4+ VIEW COMPARISON:  07/21/2018  FINDINGS: Frontal, bilateral oblique, and lateral views of the lumbar spine are obtained. Stable postsurgical changes at L5/S1. Prominent spondylosis at L4/L5. Diffuse facet hypertrophy unchanged. No acute displaced fractures.  Alignment is stable. IMPRESSION: 1. No acute bony abnormality. 2. Stable degenerative and postsurgical changes at the lumbosacral junction. Electronically Signed   By: Randa Ngo M.D.   On: 09/27/2019 01:54   CT Head Wo Contrast  Result Date: 09/27/2019 CLINICAL DATA:  Multiple falls, dizziness EXAM: CT HEAD WITHOUT CONTRAST TECHNIQUE: Contiguous axial images were obtained from the base of the skull through the vertex without intravenous  contrast. COMPARISON:  10/19/2013 FINDINGS: Brain: Evaluation limited by patient motion. No acute infarct or hemorrhage. Lateral ventricles and midline structures are unremarkable. No acute extra-axial fluid collections. No mass effect. Vascular: No hyperdense vessel or unexpected calcification. Skull: No acute displaced fracture. Chronic diffuse calvarial thickening with mixed lytic and sclerotic appearance may reflect sequela of hyperparathyroidism. This is stable. Sinuses/Orbits: No acute finding. Other: None IMPRESSION: Negative for acute intracranial pathology. Electronically Signed   By: Randa Ngo M.D.   On: 09/27/2019 02:24   CT Cervical Spine Wo Contrast  Result Date: 09/27/2019 CLINICAL DATA:  Multiple falls, neck pain EXAM: CT CERVICAL SPINE WITHOUT CONTRAST TECHNIQUE: Multidetector CT imaging of the cervical spine was performed without intravenous contrast. Multiplanar CT image reconstructions were also generated. COMPARISON:  None. FINDINGS: Alignment: Straightening of the cervical spine likely due to multilevel spondylosis. Skull base and vertebrae: No acute displaced fractures. Soft tissues and spinal canal: No prevertebral fluid or swelling. No visible canal hematoma. Disc levels: There is extensive multilevel spondylosis and facet hypertrophy. Disc space narrowing most pronounced at C4-5, C5-6, C6-7, C7-T1, and T2/T3. Extensive multilevel facet hypertrophic changes are seen at all levels. Erosive changes are seen along the left C3/C4 facet. Upper chest: Airways patent. Visualized portions of the lung apices are clear. Other: Reconstructed images demonstrate no additional findings. IMPRESSION: 1. Severe multilevel spondylosis and facet hypertrophy. 2. No acute displaced cervical spine fracture. Electronically Signed   By: Randa Ngo M.D.   On: 09/27/2019 02:27   MR BRAIN WO CONTRAST  Result Date: 09/27/2019 CLINICAL DATA:  Frequent falls and right-sided weakness EXAM: MRI HEAD WITHOUT  CONTRAST TECHNIQUE: Multiplanar, multiecho pulse sequences of the brain and surrounding structures were obtained without intravenous contrast. COMPARISON:  Correlation made with prior CT imaging FINDINGS: Motion artifact is present. Brain: There is no acute infarction or intracranial hemorrhage. There is no intracranial mass, mass effect, or edema. There is no hydrocephalus or extra-axial fluid collection. Patchy T2 hyperintensity in the supratentorial and pontine white matter is nonspecific but may reflect mild chronic microvascular ischemic changes. Mild disproportionate prominence of the ventricles with respect to the sulci likely reflects disproportionate central volume loss. Vascular: Major vessel flow voids at the skull base are preserved. Skull and upper cervical spine: Normal marrow signal is preserved. Sinuses/Orbits: Paranasal sinuses are aerated. Orbits are unremarkable. Other: Sella is unremarkable. Right mastoid tip fluid opacification. IMPRESSION: No evidence of recent infarction, hemorrhage, or mass. Mild chronic microvascular ischemic changes. Electronically Signed   By: Macy Mis M.D.   On: 09/27/2019 09:14   MR CERVICAL SPINE WO CONTRAST  Result Date: 09/27/2019 CLINICAL DATA:  Frequent falls, spinal stenosis EXAM: MRI CERVICAL SPINE WITHOUT CONTRAST TECHNIQUE: Multiplanar, multisequence MR imaging of the cervical spine was performed. No intravenous contrast was administered. COMPARISON:  None. FINDINGS: Motion artifact is present. Alignment: Mild multilevel degenerative listhesis. Vertebrae: Multilevel degenerative endplate irregularity.  Patchy degenerative endplate marrow changes including edema, greatest at C3-C4. No suspicious osseous lesion. Cord: Suspected multifocal abnormal cord signal related to stenosis, for example at C3-C5, T1, and T2-T3 levels; likely myelomalacia or less likely edema. Posterior Fossa, vertebral arteries, paraspinal tissues: Unremarkable. Disc levels: C2-C3:   No canal or foraminal stenosis. C3-C4: Disc bulge with superimposed right paracentral extrusion, endplate osteophytes, and facet and uncovertebral hypertrophy. Severe canal stenosis with cord compression. Marked right and moderate left foraminal stenosis. C4-C5: Disc bulge with central disc protrusion, endplate osteophytes, and facet and uncovertebral hypertrophy. Severe canal stenosis with cord compression. Moderate right and marked left foraminal stenosis. C5-C6: Disc bulge, endplate osteophytes, and facet and uncovertebral hypertrophy. Moderate to severe canal stenosis. Moderate foraminal stenosis. C6-C7: Disc bulge, endplate osteophytes, and facet and uncovertebral hypertrophy. Moderate canal stenosis. No right foraminal stenosis. Mild left foraminal stenosis. C7-T1: Disc bulge, endplate osteophytes, and facet uncovertebral hypertrophy. Severe canal stenosis. Moderate right and mild left foraminal stenosis. Imaged in the sagittal plane only, there are upper thoracic disc protrusions and facet hypertrophy. There is severe canal stenosis with probable cord compression at T2-T3 . IMPRESSION: Advanced multilevel degenerative changes with severe canal stenosis. There is cord compression at C3-C4 and C4-C5 and likely at T2-T3. Abnormal cord signal is present at these levels reflecting myelomalacia or less likely edema. Multilevel neural foraminal stenosis. Electronically Signed   By: Macy Mis M.D.   On: 09/27/2019 09:37   MR THORACIC SPINE WO CONTRAST  Result Date: 09/27/2019 CLINICAL DATA:  Upper and lower extremity weakness with multiple recent falls. EXAM: MRI THORACIC AND LUMBAR SPINE WITHOUT CONTRAST TECHNIQUE: Multiplanar and multiecho pulse sequences of the thoracic and lumbar spine were obtained without intravenous contrast. COMPARISON:  Lumbar spine radiographs 09/27/2019. Cervical spine MRI 09/27/2019. FINDINGS: MRI THORACIC SPINE FINDINGS Multiple sequences are moderately to severely motion  degraded despite repeated imaging attempts. Alignment: Minimal anterolisthesis of T2 on T3. Vertebrae: No fracture, suspicious osseous lesion, or definite evidence of discitis. Multilevel degenerative endplate changes, greatest at T2-3 and T3-4. Cord: No cord signal abnormality identified within limitations of motion artifact although T2 hyperintensity is likely present at T2-3 based on the prior cervical spine MRI. Paraspinal and other soft tissues: Grossly unremarkable. Disc levels: Advanced disc degeneration throughout the upper and mid thoracic spine including fusion across the disc spaces at T4-5 and T5-6 and severe disc space narrowing at multiple other levels. Disc bulging, spurring, and facet and ligamentum flavum hypertrophy result in severe spinal stenosis and moderate cord flattening at T2-3 and mild-to-moderate spinal stenosis at T3-4 as seen on the recent cervical spine MRI. Disc bulging throughout the mid to lower thoracic spine results in up to mild spinal stenosis. There is moderate to severe multilevel neural foraminal stenosis in the upper and mid thoracic spine. MRI LUMBAR SPINE FINDINGS Axial images are mildly to moderately motion degraded. Segmentation: Standard. Alignment:  Grade 1 anterolisthesis of L3 on L4 and L4 on L5. Vertebrae: Previous Ray cage fusion at L4-5. No fracture, suspicious osseous lesion, or significant marrow edema, or evidence of discitis. Conus medullaris and cauda equina: Conus extends to the L1 level. Conus and cauda equina appear normal. Paraspinal and other soft tissues: Postoperative changes in the posterior lumbar soft tissues. No fluid collection. Disc levels: T11-12: Minimal disc bulging and moderate to severe facet hypertrophy result in borderline spinal stenosis without neural foraminal stenosis. T12-L1: Minimal disc bulging and moderate facet and ligamentum flavum hypertrophy without stenosis. L1-2: Minimal disc bulging and moderate  facet and ligamentum flavum  hypertrophy without stenosis. L2-3: Minimal disc bulging, congenitally short pedicles, and moderate facet and ligamentum flavum hypertrophy result in mild-to-moderate spinal stenosis without neural foraminal stenosis. L3-4: Moderate disc space narrowing. Anterolisthesis with bulging uncovered disc, congenitally short pedicles, and severe facet and ligamentum flavum hypertrophy result in severe spinal stenosis with complete effacement of the thecal sac and moderate to severe right and severe left neural foraminal stenosis. L4-5: Previous posterior decompression and fusion. Assessment is limited by susceptibility artifact although there appears to be moderate to severe spinal stenosis at the L4 vertebral body level related to anterolisthesis and posterior element hypertrophy. The spinal canal is patent at and below the disc space level. Susceptibility artifact obscures the left neural foramen with the cage potentially extending posteriorly into the foramen. There is severe right neural foraminal stenosis due to anterolisthesis and disc space height loss. L5-S1: Previous posterior decompression. 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 Endplate spurring results in mild bilateral neural foraminal stenosis without spinal stenosis. IMPRESSION: Thoracic spine: 1. Severely motion degraded examination. 2. 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 Suspected myelomalacia or mild cord edema at T2-3 as seen on the recent cervical spine MRI. Lumbar Spine: 1. Previous posterior decompression and fusion at L4-5 and L5-S1. Severe right neural foraminal stenosis at L4-5. 2. Advanced facet and disc degeneration at L3-4 with grade 1 anterolisthesis and severe spinal and neural foraminal stenosis. Electronically Signed   By: Logan Bores M.D.   On: 09/27/2019 19:13   MR LUMBAR SPINE WO CONTRAST  Result Date: 09/27/2019 CLINICAL DATA:   Upper and lower extremity weakness with multiple recent falls. EXAM: MRI THORACIC AND LUMBAR SPINE WITHOUT CONTRAST TECHNIQUE: Multiplanar and multiecho pulse sequences of the thoracic and lumbar spine were obtained without intravenous contrast. COMPARISON:  Lumbar spine radiographs 09/27/2019. Cervical spine MRI 09/27/2019. FINDINGS: MRI THORACIC SPINE FINDINGS Multiple sequences are moderately to severely motion degraded despite repeated imaging attempts. Alignment: Minimal anterolisthesis of T2 on T3. Vertebrae: No fracture, suspicious osseous lesion, or definite evidence of discitis. Multilevel degenerative endplate changes, greatest at T2-3 and T3-4. Cord: No cord signal abnormality identified within limitations of motion artifact although T2 hyperintensity is likely present at T2-3 based on the prior cervical spine MRI. Paraspinal and other soft tissues: Grossly unremarkable. Disc levels: Advanced disc degeneration throughout the upper and mid thoracic spine including fusion across the disc spaces at T4-5 and T5-6 and severe disc space narrowing at multiple other levels. Disc bulging, spurring, and facet and ligamentum flavum hypertrophy result in severe spinal stenosis and moderate cord flattening at T2-3 and mild-to-moderate spinal stenosis at T3-4 as seen on the recent cervical spine MRI. Disc bulging throughout the mid to lower thoracic spine results in up to mild spinal stenosis. There is moderate to severe multilevel neural foraminal stenosis in the upper and mid thoracic spine. MRI LUMBAR SPINE FINDINGS Axial images are mildly to moderately motion degraded. Segmentation: Standard. Alignment:  Grade 1 anterolisthesis of L3 on L4 and L4 on L5. Vertebrae: Previous Ray cage fusion at L4-5. No fracture, suspicious osseous lesion, or significant marrow edema, or evidence of discitis. Conus medullaris and cauda equina: Conus extends to the L1 level. Conus and cauda equina appear normal. Paraspinal and other  soft tissues: Postoperative changes in the posterior lumbar soft tissues. No fluid collection. Disc levels: T11-12: Minimal disc bulging and moderate to severe facet hypertrophy result in borderline spinal stenosis  without neural foraminal stenosis. T12-L1: Minimal disc bulging and moderate facet and ligamentum flavum hypertrophy without stenosis. L1-2: Minimal disc bulging and moderate facet and ligamentum flavum hypertrophy without stenosis. L2-3: Minimal disc bulging, congenitally short pedicles, and moderate facet and ligamentum flavum hypertrophy result in mild-to-moderate spinal stenosis without neural foraminal stenosis. L3-4: Moderate disc space narrowing. Anterolisthesis with bulging uncovered disc, congenitally short pedicles, and severe facet and ligamentum flavum hypertrophy result in severe spinal stenosis with complete effacement of the thecal sac and moderate to severe right and severe left neural foraminal stenosis. L4-5: Previous posterior decompression and fusion. Assessment is limited by susceptibility artifact although there appears to be moderate to severe spinal stenosis at the L4 vertebral body level related to anterolisthesis and posterior element hypertrophy. The spinal canal is patent at and below the disc space level. Susceptibility artifact obscures the left neural foramen with the cage potentially extending posteriorly into the foramen. There is severe right neural foraminal stenosis due to anterolisthesis and disc space height loss. L5-S1: Previous posterior decompression. 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 Endplate spurring results in mild bilateral neural foraminal stenosis without spinal stenosis. IMPRESSION: Thoracic spine: 1. Severely motion degraded examination. 2. 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 Suspected myelomalacia or mild cord edema at T2-3 as seen on the recent  cervical spine MRI. Lumbar Spine: 1. Previous posterior decompression and fusion at L4-5 and L5-S1. Severe right neural foraminal stenosis at L4-5. 2. Advanced facet and disc degeneration at L3-4 with grade 1 anterolisthesis and severe spinal and neural foraminal stenosis. Electronically Signed   By: Logan Bores M.D.   On: 09/27/2019 19:13   DG Knee Complete 4 Views Right  Result Date: 09/27/2019 CLINICAL DATA:  [place] yesterday, inability to bear weight EXAM: RIGHT KNEE - COMPLETE 4+ VIEW COMPARISON:  01/16/2014 FINDINGS: Frontal, bilateral oblique, lateral views of the right knee are obtained. Postsurgical changes are seen from prior right hip arthroplasty as well as ORIF of the tibia. There are no acute displaced fractures. Alignment is anatomic. No joint effusion. Diffuse vascular calcifications. IMPRESSION: 1. Extensive postsurgical changes as above. No acute bony abnormality. Electronically Signed   By: Randa Ngo M.D.   On: 09/27/2019 01:58   DG Ankle Right Port  Result Date: 09/27/2019 CLINICAL DATA:  Multiple falls, fell 09/26/2018, lateral ankle pain EXAM: PORTABLE RIGHT ANKLE - 2 VIEW COMPARISON:  Knee radiograph 09/27/2019, tibia/fibula radiograph 01/16/2014 FINDINGS: Postsurgical changes from prior open reduction internal fixation of a mid shaft tibia and fibular fractures with exuberant callus formation. The inferior-most screw of the lateral side plate fixation of the tibia is fractured. No other evidence of hardware failure or complication. Healed fracture deformity of the fibula with exuberant ossification across the displaced fracture defect. Radiolucent screw tracks are noted in the distal fibula. Evaluation is limited by suboptimal oblique mortise view. No definite acute fracture or traumatic malalignment is seen at the level of the ankle. There is a comminuted minimally displaced fracture involving the base of the fifth metatarsal. Associated soft tissue swelling is noted both at the  ankle and lateral foot. There are moderate degenerative changes at the ankle. Additional degenerative features noted in the mid and hindfoot as included in the level of imaging. Plantar calcaneal spur. Vascular calcification in the soft tissues. IMPRESSION: 1. Acute comminuted minimally displaced fracture involving the base of the fifth metatarsal. Associated soft tissue swelling. 2. No discrete ankle fracture or  traumatic malalignment is seen. Mild soft tissue swelling at the ankle without effusion. 3. Postsurgical changes from prior open reduction internal fixation of a mid shaft tibia fracture with hypertrophic callus formation. The inferior-most screw of the lateral side plate fixation of the tibia is fractured. No other evidence of hardware failure or complication. 4. Healed fracture deformity of the fibula with exuberant callus formation as well. Electronically Signed   By: Lovena Le M.D.   On: 09/27/2019 05:53   DG Hip Unilat W or Wo Pelvis 2-3 Views Right  Result Date: 09/27/2019 CLINICAL DATA:  [place] yesterday, inability to bear weight, pain EXAM: DG HIP (WITH OR WITHOUT PELVIS) 2-3V RIGHT COMPARISON:  None FINDINGS: Frontal view of the pelvis as well as frontal and cross-table lateral views of the right hip are obtained. No acute displaced fractures. Alignment is anatomic. Symmetrical bilateral hip osteoarthritis. Postsurgical changes at the lumbosacral junction. The remainder of the bony pelvis is unremarkable. IMPRESSION: 1. No acute fracture. 2. Symmetrical bilateral hip osteoarthritis. Electronically Signed   By: Randa Ngo M.D.   On: 09/27/2019 01:56        Scheduled Meds: . amLODipine  5 mg Oral Daily  . dextrose  2 Tube Oral STAT  . insulin aspart  0-5 Units Subcutaneous QHS  . insulin aspart  0-9 Units Subcutaneous TID WC  . insulin glargine  20 Units Subcutaneous Daily  . metoprolol succinate  100 mg Oral Daily  . sodium chloride flush  3 mL Intravenous Q12H  . sodium  chloride flush  3 mL Intravenous Q12H   Continuous Infusions: . sodium chloride       LOS: 0 days    Time spent: 38 minutes spent on chart review, discussion with nursing staff, consultants, updating family and interview/physical exam; more than 50% of that time was spent in counseling and/or coordination of care.    Makenzie Weisner J [place] DO [facility] Available via Epic secure chat 7am-7pm After these hours, please refer to coverage provider listed on [email or web address] 09/28/2019, 11:28 AM

## 2019-09-29 ENCOUNTER — Encounter: Payer: Self-pay | Admitting: Internal Medicine

## 2019-09-29 MED ORDER — HYDROCODONE-ACETAMINOPHEN 5-325 MG PO TABS: 1.0000 | ORAL_TABLET | Freq: Four times a day (QID) | ORAL | 0 refills | Status: DC | PRN

## 2019-09-29 MED ORDER — AMLODIPINE BESYLATE 5 MG PO TABS: 5.0000 mg | ORAL_TABLET | Freq: Every day | ORAL | 0 refills | Status: DC

## 2019-09-29 MED ORDER — INSULIN GLARGINE 100 UNIT/ML ~~LOC~~ SOLN: 20.0000 [IU] | Freq: Every day | SUBCUTANEOUS | 11 refills | Status: DC

## 2019-09-29 NOTE — Discharge Instructions (Signed)
Central Cord Syndrome  Central cord syndrome is a spinal cord injury that causes weakness and reduced feeling in the arms and hands. It happens when the neck gets stretched too far backward and pinches the spinal cord, causing nerve damage. Less commonly, central cord syndrome can also affect the legs or cause problems in passing urine (urinary retention). Having bulging disks in the spine or arthritis can make symptoms worse. The spinal cord is a long bundle of nerve cells (neurons) and nerve fibers (axons). It carries messages back and forth between the brain and the rest of the body. When a person has central cord syndrome, this pathway is partially blocked. Many people with this syndrome are able to recover from their symptoms. What are the causes? This condition is usually caused by an injury that affects the neck (cervical spine) or upper back (thoracic spine). Often, the injury involves stretching the head and neck too far backward. What increases the risk? You are more likely to develop this condition if you:  Are age 46 or older.  Are female.  Have a bulging disk, arthritis, or a narrow cervical spine (cervical stenosis).  Participate in sports or activities that involve a high risk of falling on your head, neck, or back. What are the signs or symptoms? This condition most commonly affects the arms and hands. It may also affect the legs, but is usually less severe in the legs. It may cause one or more of the following:  Weakness.  Partial loss of movement.  Partial numbness.  A feeling of pain, pressure, or tingling.  Inability to pass urine (urinary retention). Some symptoms may be permanent, but symptoms often go away with treatment. How is this diagnosed? This condition may be diagnosed based on:  Your symptoms and any recent injuries you have had.  A physical exam.  Electromyogram (EMG) and nerve conduction studies. These tests check: ? How your muscles respond to  signals from your brain. ? How well your nerves can send electrical signals.  Imaging tests, such as: ? X-rays. ? CT scan. ? MRI. How is this treated? Treatment for this condition may include:  Wearing a hard (rigid) neck brace (cervical collar) to prevent your neck from moving.  Anti-inflammatory medicines (steroids) to reduce swelling.  Surgery to relieve pressure on the spine or improve stability of the spine. There is no cure for central cord syndrome, but treatment and long-term care can help to relieve symptoms. You may work with a team of health care providers and family caregivers to make a plan for long-term care, which may include:  Medicines to reduce pain.  Medicines to reduce muscle stiffness (spasticity).  Physical therapy (PT) exercises to strengthen muscles and prevent stiffness.  Occupational therapy (OT) to make your home or work environment safe and manageable.  Psychological and emotional counseling to help if you are experiencing changes in mood such as depression or anxiety.  Bladder management if you have urinary retention. Your health care provider will show you how to use a tube to drain your bladder when needed (intermittent catheterization).  Social and emotional support from family and friends. Treatments may vary based on the type of injury you have and how severe it is. Follow these instructions at home: If you were given a cervical collar to wear:  Do not remove the collar unless your health care provider tells you to do that. Follow instructions from your health care provider about how to safely remove the collar for cleaning and  bathing.  Talk with your health care provider before you make any adjustments to your collar. You may need to make small adjustments over time to improve comfort and to reduce pressure on your chin or the back of your head.  Keep long hair outside of the collar.  Keep your collar clean by wiping it with mild soap and  water and letting it air-dry completely.  Check your skin for irritation or sores every time you remove your collar. If you see any, tell your health care provider. Activity  Do not lift anything that is heavier than 10 lb (4.5 kg), or the limit that you are told, until your health care provider says that it is safe.  Rest and return to your normal activities as told by your health care provider. Ask your health care provider what activities are safe for you. General instructions  Take over-the-counter and prescription medicines only as told by your health care provider.  Work closely with all your health care providers and therapists. Follow their recommendations about: ? Exercising. ? How to get the right amount and type of nutrition. ? Taking care of yourself at home and outside of the home. ? Managing bladder problems. ? Managing mental health problems. ? Pain management.  Do not use any products that contain nicotine or tobacco, such as cigarettes and e-cigarettes. If you need help quitting, ask your health care provider.  If you are struggling with anxiety or depression, talk with your health care provider or someone you trust.  Keep all follow-up visits as told by your health care provider. This is important. Contact a health care provider if:  You have a cough.  You have shortness of breath.  You have a fever or chills.  Your symptoms gradually change or get worse.  You need more help or support at home.  You are wearing a cervical collar and you have sores or irritation on your neck. Get help right away if:  Your symptoms suddenly get worse.  You have chest pain.  You have trouble breathing.  You have urinary retention that is new (acuteonset).  You lose your ability to control when you urinate or when you have a bowel movement (bladder or bowel incontinence).  You have severe neck pain.  You may have injured your head, neck, or spinal  cord. Summary  Central cord syndrome is usually caused by an injury that involves stretching the head and neck too far backward.  Central cord syndrome can cause weakness and numbness in the hands and arms. Less commonly, it can also affect the legs, but less severely.  Treatment may involve resting, wearing a hard (rigid) neck brace (cervical collar), or having surgery on the spine. This information is not intended to replace advice given to you by your health care provider. Make sure you discuss any questions you have with your health care provider. Document Revised: 07/02/2017 Document Reviewed: 03/24/2017 Elsevier Patient Education  [place]. Spinal Stenosis  Spinal stenosis occurs when the open space (spinal canal) between the bones of your spine (vertebrae) narrows, putting pressure on the spinal cord or nerves. What are the causes? This condition is caused by areas of bone pushing into the central canals of your vertebrae. This condition may be present at birth (congenital), or it may be caused by:  Arthritic deterioration of your vertebrae (spinal degeneration). This usually starts around age 16.  Injury or trauma to the spine.  Tumors in the spine.  Calcium deposits  in the spine. What are the signs or symptoms? Symptoms of this condition include:  Pain in the neck or back that is generally worse with activities, particularly when standing and walking.  Numbness, tingling, hot or cold sensations, weakness, or weariness in your legs.  Pain going up and down the leg (sciatica).  Frequent episodes of falling.  A foot-slapping gait that leads to muscle weakness. In more serious cases, you may develop:  Problemspassing stool or passing urine.  Difficulty having sex.  Loss of feeling in part or all of your leg. Symptoms may come on slowly and get worse over time. How is this diagnosed? This condition is diagnosed based on your medical history and a physical  exam. Tests will also be done, such as:  MRI.  CT scan.  X-ray. How is this treated? Treatment for this condition often focuses on managing your pain and any other symptoms. Treatment may include:  Practicing good posture to lessen pressure on your nerves.  Exercising to strengthen muscles, build endurance, improve balance, and maintain good joint movement (range of motion).  Losing weight, if needed.  Taking medicines to reduce swelling, inflammation, or pain.  Assistive devices, such as a corset or brace. In some cases, surgery may be needed. The most common procedure is decompression laminectomy. This is done to remove excess bone that puts pressure on your nerve roots. Follow these instructions at home: Managing pain, stiffness, and swelling  Do all exercises and stretches as told by your health care provider.  Practice good posture. If you were given a brace or a corset, wear it as told by your health care provider.  Do not do any activities that cause pain. Ask your health care provider what activities are safe for you.  Do not lift anything that is heavier than 10 lb (4.5 kg) or the limit that your health care provider tells you.  Maintain a healthy weight. Talk with your health care provider if you need help losing weight.  If directed, apply heat to the affected area as often as told by your health care provider. Use the heat source that your health care provider recommends, such as a moist heat pack or a heating pad. ? Place a towel between your skin and the heat source. ? Leave the heat on for 20-30 minutes. ? Remove the heat if your skin turns bright red. This is especially important if you are not able to feel pain, heat, or cold. You may have a greater risk of getting burned. General instructions  Take over-the-counter and prescription medicines only as told by your health care provider.  Do not use any products that contain nicotine or tobacco, such as  cigarettes and e-cigarettes. If you need help quitting, ask your health care provider.  Eat a healthy diet. This includes plenty of fruits and vegetables, whole grains, and low-fat (lean) protein.  Keep all follow-up visits as told by your health care provider. This is important. Contact a health care provider if:  Your symptoms do not get better or they get worse.  You have a fever. Get help right away if:  You have new or worse pain in your neck or upper back.  You have severe pain that cannot be controlled with medicines.  You are dizzy.  You have vision problems, blurred vision, or double vision.  You have a severe headache that is worse when you stand.  You have nausea or you vomit.  You develop new or worse numbness  or tingling in your back or legs.  You have pain, redness, swelling, or warmth in your arm or leg. Summary  Spinal stenosis occurs when the open space (spinal canal) between the bones of your spine (vertebrae) narrows. This narrowing puts pressure on the spinal cord or nerves.  Spinal stenosis can cause numbness, weakness, or pain in the neck, back, and legs.  This condition may be caused by a birth defect, arthritic deterioration of your vertebrae, injury, tumors, or calcium deposits.  This condition is usually diagnosed with MRIs, CT scans, and X-rays. This information is not intended to replace advice given to you by your health care provider. Make sure you discuss any questions you have with your health care provider. Document Revised: 07/02/2017 Document Reviewed: 06/24/2016 Elsevier Patient Education  2020 [employer].

## 2019-09-29 NOTE — Discharge Summary (Signed)
Physician Discharge Summary  Holly Weiss OLM:[id number] DOB: 1940/09/10 DOA: 09/27/2019  PCP: Biagio Borg, MD  Admit date: 09/27/2019 Discharge date: 09/29/2019  Admitted From:  Disposition:    Recommendations for Outpatient Follow-up:  1. Follow up with PCP in 1-2 weeks 2. Follow-up with neurosurgery, Dr. 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 Started on amlodipine 5 mg p.o. daily for better control of her hypertension 4. Continue to discuss with patient and family that if no intervention regarding her spinal stenosis, that this condition will eventually lead to paralysis. 5. 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 Patient reports that a son lives with her and helps when he can but she mainly cares for herself. She reports increasingly frequent falls over the past month, denies loss of consciousness, but reports increase in her chronic back, neck, knee, hip, and ankle pain. She also reports right-sided weakness that has failed to improve over the past month, most notable in her right arm. She is right-handed and states that she is no longer able to write due to this weakness. She denies fevers, chills, chest pain, cough, or  shortness of breath.  Upon arrival to the ED, patient is found to be afebrile, saturating well on room air, and with blood pressure 150/100. EKG features sinus rhythm with PACs. Chest x-ray notable for vascular congestion and bibasilar atelectasis. Radiographs of the right knee, lumbar spine, hips and pelvis are notable for degenerative changes but no acute fracture. No acute hemorrhages noted on motion degraded head CT. Severe multilevel spondylosis is seen on cervical spine CT. Chemistry panel features a glucose of 320 and creatinine 1.07. CBC is unremarkable. Patient was given IV fentanyl in the ED and Covid PCR screening test is pending.  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  CT head with no acute intracranial findings.  MR brain negative.  MR C-spine notable for severe cervical spinal canal stenosis with cord compression at C3-4, C4-5, T2-3.  MRI thoracic and lumbar spine with severe spinal stenosis at T2-3, L 3-4 and L4-5 with spondylolisthesis at L4-5 with ray cages.  Neurosurgery was consulted, Dr. Arnoldo Morale to evaluate the patient with recommendations of proceeding with C3-4, C4-5 and C 5-6 anterior cervical discectomy and possible C4 and C5 corpectomy with anterior instrumentation and fusion.  Patient currently declines surgical intervention and will consider further outpatient.  Patient understands that if she continues to do nothing, she will likely continue to worsen and eventually become paralyzed, in which she understands.  This was also discussed with patient's daughters, [place]y [place] and [place].  PT/OT recommend SNF placement, patient and daughter Sara Chu  wish to discharge home with home health.  Recommend outpatient follow-up with neurosurgery, Dr. Arnoldo Morale if wishes to further discuss possible surgical options, otherwise will eventually lead to paralysis.  Right base fifth metatarsal fracture Discussed case with orthopedics, Dr. Lyla Glassing he recommended place patient in a hard soled postoperative shoe.  Continue outpatient follow-up with orthopedics.  Has followed with [facility] in the past.  Type 2 diabetes mellitus, poorly controlled Hemoglobin A1c 10.0 which is improved from 14.5 on 06/19/2019.  Taking Lantus 70 units subcutaneously daily, Humalog 15 units subcutaneously every afternoon at home. Decreased Lantus dose to 20 units subcutaneously daily for poor oral intake.  Follow-up with PCP for further monitoring of her glucose.  Essential hypertension Started on amlodipine 5 mg p.o. daily for poorly controlled hypertension.  Continue metoprolol succinate 100 mg p.o. daily.  CKD stage IIIa Creatinine 0.9-1.1 past year.  Creatinine on admission 1.07, at baseline.  Asthma Stable, albuterol as needed  Discharge Diagnoses:  Principal Problem:   Frequent falls Active Problems:   Chronic pain syndrome   Essential hypertension   Chronic diastolic congestive heart failure (HCC)   Asthma   Uncontrolled diabetes mellitus with hyperglycemia, with long-term current use of insulin (HCC)   Dementia with psychosis (HCC)   CKD (chronic kidney disease), stage III   Fracture of 5th metatarsal   Cervical spinal stenosis   Cord compression syndrome (HCC)   Cervical stenosis of spinal canal    Discharge Instructions  Discharge Instructions    Call MD for:  difficulty breathing, headache or visual disturbances   Complete by: As directed    Call MD for:  extreme fatigue   Complete by: As directed    Call MD for:  persistant dizziness or light-headedness   Complete by: As directed    Call MD for:  persistant nausea and  vomiting   Complete by: As directed    Call MD for:  severe uncontrolled pain   Complete by: As directed    Call MD for:  temperature >100.4   Complete by: As directed    Diet - low sodium heart healthy   Complete by: As directed    Increase activity slowly   Complete by: As directed      Allergies as of 09/29/2019      Reactions   Adhesive [tape] Itching   Endocet [oxycodone-acetaminophen] Itching   Latex Itching   Oxycodone-acetaminophen Itching      Medication List    STOP taking these medications   diclofenac sodium 1 % Gel Commonly known as: VOLTAREN   insulin lispro 100 UNIT/ML KwikPen Commonly known as: HumaLOG KwikPen   Lantus SoloStar 100 UNIT/ML Solostar Pen Generic drug: Insulin Glargine Replaced by: insulin glargine 100 UNIT/ML injection   Magnesium 250 MG Tabs   potassium chloride 10 MEQ tablet Commonly known as: KLOR-CON   QUEtiapine 100 MG tablet Commonly known as: SEROquel   rosuvastatin 20 MG tablet Commonly known as: CRESTOR   triamcinolone 55 MCG/ACT Aero nasal inhaler Commonly known as: NASACORT     TAKE these medications   albuterol 108 (90 Base) MCG/ACT inhaler Commonly known as: Ventolin HFA INHALE 2 PUFFS INTO THE LUNGS TWICE DAILY AS NEEDED FOR WHEEZING OR SHORTNESS OF BREATH What changed:   how much to take  how to take this  when to take this  reasons to take this  additional instructions   amLODipine 5 MG tablet Commonly known as: NORVASC Take 1 tablet (5 mg total) by mouth  daily. Start taking on: September 30, 2019   aspirin 81 MG tablet Take 1 tablet (81 mg total) by mouth daily.   CALTRATE 600+D PO Take 2 tablets by mouth daily.   FreeStyle Libre 14 Day Reader Kerrin Mo Apply 1 Device topically daily. Use as directed daily E11.9   FreeStyle Libre 14 Day Sensor Misc Apply 1 Device topically every 14 (fourteen) days. E11.9   furosemide 40 MG tablet Commonly known as: LASIX 1 tab by mouth once daily What changed:    how much to take  how to take this  when to take this  reasons to take this  additional instructions   glucose blood test strip Commonly known as: ONE TOUCH TEST STRIPS Use as directed once daily to check blood sugar.  Diagnosis code E11.9   HYDROcodone-acetaminophen 5-325 MG tablet Commonly known as: NORCO/VICODIN Take 1 tablet by mouth every 6 (six) hours as needed for moderate pain.   insulin glargine 100 UNIT/ML injection Commonly known as: LANTUS Inject 0.2 mLs (20 Units total) into the skin daily. Start taking on: September 30, 2019 Replaces: Lantus SoloStar 100 UNIT/ML Solostar Pen   Insulin Pen Needle 31G X 8 MM Misc Commonly known as: B-D ULTRAFINE III SHORT PEN USE TO CHECK BLOOD SUGERS TWICE DAILY   B-D ULTRAFINE III SHORT PEN 31G X 8 MM Misc Generic drug: Insulin Pen Needle USE TO CHECK BLOOD SUGERS TWICE DAILY   metoprolol succinate 100 MG 24 hr tablet Commonly known as: TOPROL-XL TAKE 1 TABLET BY MOUTH EVERY DAY WITH OR IMMEDIATELY FOLLOWING A MEAL What changed:   how much to take  how to take this  when to take this  additional instructions   [place] w/Device Kit Use as directed once daily to check blood sugar.  Diagnosis code 250.02   ONE TOUCH LANCETS Misc Use as directed once daily to check blood sugar. DX E11.09   ONE-A-DAY EXTRAS ANTIOXIDANT PO Take 1 tablet by mouth daily.   tiZANidine 2 MG tablet Commonly known as: ZANAFLEX Take 1 tablet (2 mg total) by mouth every 6 (six) hours as needed for muscle spasms. What changed: when to take this      Follow-up Information    Biagio Borg, MD. Schedule an appointment as soon as possible for a visit in 1 week(s).   Specialties: Internal Medicine, Radiology Contact information: [place] [address] [phone]        Newman Pies, MD. Call in 2 week(s).   Specialty: Neurosurgery Contact information: [address] [place] [address] [phone]          Allergies  Allergen Reactions  . Adhesive [Tape] Itching  . Endocet [Oxycodone-Acetaminophen] Itching  . Latex Itching  . Oxycodone-Acetaminophen Itching    Consultations:  Neurosurgery - Dr. Arnoldo Morale -signed off 09/27/2019 as patient declines surgical invention, outpatient follow-up   Procedures/Studies: DG Chest 1 View  Result Date: 09/27/2019 CLINICAL DATA:  [place], short of breath, low back pain EXAM: CHEST  1 VIEW COMPARISON:  10/19/2013 FINDINGS: Single frontal view of the chest demonstrates postsurgical changes from median sternotomy. Cardiac silhouette is enlarged. There is central vascular congestion. Streaky bibasilar consolidation likely reflects atelectasis or scarring. No airspace disease, effusion, or pneumothorax. IMPRESSION: 1. Central vascular congestion. 2. Streaky bibasilar consolidation favor atelectasis or scarring. Electronically Signed   By: Randa Ngo M.D.   On: 09/27/2019 01:55   DG Lumbar Spine Complete  Result Date: 09/27/2019  CLINICAL DATA:  [place] yesterday, low back pain EXAM: LUMBAR SPINE - COMPLETE 4+ VIEW COMPARISON:  07/21/2018 FINDINGS: Frontal, bilateral oblique, and lateral views of the lumbar spine are obtained. Stable postsurgical changes at L5/S1. Prominent spondylosis at L4/L5. Diffuse facet hypertrophy unchanged. No acute displaced fractures.  Alignment is stable. IMPRESSION: 1. No acute bony abnormality. 2. Stable degenerative and postsurgical changes at the lumbosacral junction. Electronically Signed   By: Randa Ngo M.D.   On: 09/27/2019 01:54   CT Head Wo Contrast  Result Date: 09/27/2019 CLINICAL DATA:  Multiple falls, dizziness EXAM: CT HEAD WITHOUT CONTRAST TECHNIQUE: Contiguous axial images were obtained from the base of the skull through the vertex without intravenous contrast. COMPARISON:  10/19/2013 FINDINGS: Brain: Evaluation limited by patient motion. No acute infarct or hemorrhage.  Lateral ventricles and midline structures are unremarkable. No acute extra-axial fluid collections. No mass effect. Vascular: No hyperdense vessel or unexpected calcification. Skull: No acute displaced fracture. Chronic diffuse calvarial thickening with mixed lytic and sclerotic appearance may reflect sequela of hyperparathyroidism. This is stable. Sinuses/Orbits: No acute finding. Other: None IMPRESSION: Negative for acute intracranial pathology. Electronically Signed   By: Randa Ngo M.D.   On: 09/27/2019 02:24   CT Cervical Spine Wo Contrast  Result Date: 09/27/2019 CLINICAL DATA:  Multiple falls, neck pain EXAM: CT CERVICAL SPINE WITHOUT CONTRAST TECHNIQUE: Multidetector CT imaging of the cervical spine was performed without intravenous contrast. Multiplanar CT image reconstructions were also generated. COMPARISON:  None. FINDINGS: Alignment: Straightening of the cervical spine likely due to multilevel spondylosis. Skull base and vertebrae: No acute displaced fractures. Soft tissues and spinal canal: No prevertebral fluid or swelling. No visible canal hematoma. Disc levels: There is extensive multilevel spondylosis and facet hypertrophy. Disc space narrowing most pronounced at C4-5, C5-6, C6-7, C7-T1, and T2/T3. Extensive multilevel facet hypertrophic changes are seen at all levels. Erosive changes are seen along the left C3/C4 facet. Upper chest: Airways patent. Visualized portions of the lung apices are clear. Other: Reconstructed images demonstrate no additional findings. IMPRESSION: 1. Severe multilevel spondylosis and facet hypertrophy. 2. No acute displaced cervical spine fracture. Electronically Signed   By: Randa Ngo M.D.   On: 09/27/2019 02:27   MR BRAIN WO CONTRAST  Result Date: 09/27/2019 CLINICAL DATA:  Frequent falls and right-sided weakness EXAM: MRI HEAD WITHOUT CONTRAST TECHNIQUE: Multiplanar, multiecho pulse sequences of the brain and surrounding structures were obtained without  intravenous contrast. COMPARISON:  Correlation made with prior CT imaging FINDINGS: Motion artifact is present. Brain: There is no acute infarction or intracranial hemorrhage. There is no intracranial mass, mass effect, or edema. There is no hydrocephalus or extra-axial fluid collection. Patchy T2 hyperintensity in the supratentorial and pontine white matter is nonspecific but may reflect mild chronic microvascular ischemic changes. Mild disproportionate prominence of the ventricles with respect to the sulci likely reflects disproportionate central volume loss. Vascular: Major vessel flow voids at the skull base are preserved. Skull and upper cervical spine: Normal marrow signal is preserved. Sinuses/Orbits: Paranasal sinuses are aerated. Orbits are unremarkable. Other: Sella is unremarkable. Right mastoid tip fluid opacification. IMPRESSION: No evidence of recent infarction, hemorrhage, or mass. Mild chronic microvascular ischemic changes. Electronically Signed   By: Macy Mis M.D.   On: 09/27/2019 09:14   MR CERVICAL SPINE WO CONTRAST  Result Date: 09/27/2019 CLINICAL DATA:  Frequent falls, spinal stenosis EXAM: MRI CERVICAL SPINE WITHOUT CONTRAST TECHNIQUE: Multiplanar, multisequence MR imaging of the cervical spine was performed. No intravenous contrast was administered.  COMPARISON:  None. FINDINGS: Motion artifact is present. Alignment: Mild multilevel degenerative listhesis. Vertebrae: Multilevel degenerative endplate irregularity. Patchy degenerative endplate marrow changes including edema, greatest at C3-C4. No suspicious osseous lesion. Cord: Suspected multifocal abnormal cord signal related to stenosis, for example at C3-C5, T1, and T2-T3 levels; likely myelomalacia or less likely edema. Posterior Fossa, vertebral arteries, paraspinal tissues: Unremarkable. Disc levels: C2-C3:  No canal or foraminal stenosis. C3-C4: Disc bulge with superimposed right paracentral extrusion, endplate osteophytes,  and facet and uncovertebral hypertrophy. Severe canal stenosis with cord compression. Marked right and moderate left foraminal stenosis. C4-C5: Disc bulge with central disc protrusion, endplate osteophytes, and facet and uncovertebral hypertrophy. Severe canal stenosis with cord compression. Moderate right and marked left foraminal stenosis. C5-C6: Disc bulge, endplate osteophytes, and facet and uncovertebral hypertrophy. Moderate to severe canal stenosis. Moderate foraminal stenosis. C6-C7: Disc bulge, endplate osteophytes, and facet and uncovertebral hypertrophy. Moderate canal stenosis. No right foraminal stenosis. Mild left foraminal stenosis. C7-T1: Disc bulge, endplate osteophytes, and facet uncovertebral hypertrophy. Severe canal stenosis. Moderate right and mild left foraminal stenosis. Imaged in the sagittal plane only, there are upper thoracic disc protrusions and facet hypertrophy. There is severe canal stenosis with probable cord compression at T2-T3 . IMPRESSION: Advanced multilevel degenerative changes with severe canal stenosis. There is cord compression at C3-C4 and C4-C5 and likely at T2-T3. Abnormal cord signal is present at these levels reflecting myelomalacia or less likely edema. Multilevel neural foraminal stenosis. Electronically Signed   By: Macy Mis M.D.   On: 09/27/2019 09:37   MR THORACIC SPINE WO CONTRAST  Result Date: 09/27/2019 CLINICAL DATA:  Upper and lower extremity weakness with multiple recent falls. EXAM: MRI THORACIC AND LUMBAR SPINE WITHOUT CONTRAST TECHNIQUE: Multiplanar and multiecho pulse sequences of the thoracic and lumbar spine were obtained without intravenous contrast. COMPARISON:  Lumbar spine radiographs 09/27/2019. Cervical spine MRI 09/27/2019. FINDINGS: MRI THORACIC SPINE FINDINGS Multiple sequences are moderately to severely motion degraded despite repeated imaging attempts. Alignment: Minimal anterolisthesis of T2 on T3. Vertebrae: No fracture,  suspicious osseous lesion, or definite evidence of discitis. Multilevel degenerative endplate changes, greatest at T2-3 and T3-4. Cord: No cord signal abnormality identified within limitations of motion artifact although T2 hyperintensity is likely present at T2-3 based on the prior cervical spine MRI. Paraspinal and other soft tissues: Grossly unremarkable. Disc levels: Advanced disc degeneration throughout the upper and mid thoracic spine including fusion across the disc spaces at T4-5 and T5-6 and severe disc space narrowing at multiple other levels. Disc bulging, spurring, and facet and ligamentum flavum hypertrophy result in severe spinal stenosis and moderate cord flattening at T2-3 and mild-to-moderate spinal stenosis at T3-4 as seen on the recent cervical spine MRI. Disc bulging throughout the mid to lower thoracic spine results in up to mild spinal stenosis. There is moderate to severe multilevel neural foraminal stenosis in the upper and mid thoracic spine. MRI LUMBAR SPINE FINDINGS Axial images are mildly to moderately motion degraded. Segmentation: Standard. Alignment:  Grade 1 anterolisthesis of L3 on L4 and L4 on L5. Vertebrae: Previous Ray cage fusion at L4-5. No fracture, suspicious osseous lesion, or significant marrow edema, or evidence of discitis. Conus medullaris and cauda equina: Conus extends to the L1 level. Conus and cauda equina appear normal. Paraspinal and other soft tissues: Postoperative changes in the posterior lumbar soft tissues. No fluid collection. Disc levels: T11-12: Minimal disc bulging and moderate to severe facet hypertrophy result in borderline spinal stenosis without neural foraminal stenosis. T12-L1:  Minimal disc bulging and moderate facet and ligamentum flavum hypertrophy without stenosis. L1-2: Minimal disc bulging and moderate facet and ligamentum flavum hypertrophy without stenosis. L2-3: Minimal disc bulging, congenitally short pedicles, and moderate facet and  ligamentum flavum hypertrophy result in mild-to-moderate spinal stenosis without neural foraminal stenosis. L3-4: Moderate disc space narrowing. Anterolisthesis with bulging uncovered disc, congenitally short pedicles, and severe facet and ligamentum flavum hypertrophy result in severe spinal stenosis with complete effacement of the thecal sac and moderate to severe right and severe left neural foraminal stenosis. L4-5: Previous posterior decompression and fusion. Assessment is limited by susceptibility artifact although there appears to be moderate to severe spinal stenosis at the L4 vertebral body level related to anterolisthesis and posterior element hypertrophy. The spinal canal is patent at and below the disc space level. Susceptibility artifact obscures the left neural foramen with the cage potentially extending posteriorly into the foramen. There is severe right neural foraminal stenosis due to anterolisthesis and disc space height loss. L5-S1: Previous posterior decompression. 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 Endplate spurring results in mild bilateral neural foraminal stenosis without spinal stenosis. IMPRESSION: Thoracic spine: 1. Severely motion degraded examination. 2. 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 Suspected myelomalacia or mild cord edema at T2-3 as seen on the recent cervical spine MRI. Lumbar Spine: 1. Previous posterior decompression and fusion at L4-5 and L5-S1. Severe right neural foraminal stenosis at L4-5. 2. Advanced facet and disc degeneration at L3-4 with grade 1 anterolisthesis and severe spinal and neural foraminal stenosis. Electronically Signed   By: Logan Bores M.D.   On: 09/27/2019 19:13   MR LUMBAR SPINE WO CONTRAST  Result Date: 09/27/2019 CLINICAL DATA:  Upper and lower extremity weakness with multiple recent falls. EXAM: MRI THORACIC AND LUMBAR SPINE WITHOUT  CONTRAST TECHNIQUE: Multiplanar and multiecho pulse sequences of the thoracic and lumbar spine were obtained without intravenous contrast. COMPARISON:  Lumbar spine radiographs 09/27/2019. Cervical spine MRI 09/27/2019. FINDINGS: MRI THORACIC SPINE FINDINGS Multiple sequences are moderately to severely motion degraded despite repeated imaging attempts. Alignment: Minimal anterolisthesis of T2 on T3. Vertebrae: No fracture, suspicious osseous lesion, or definite evidence of discitis. Multilevel degenerative endplate changes, greatest at T2-3 and T3-4. Cord: No cord signal abnormality identified within limitations of motion artifact although T2 hyperintensity is likely present at T2-3 based on the prior cervical spine MRI. Paraspinal and other soft tissues: Grossly unremarkable. Disc levels: Advanced disc degeneration throughout the upper and mid thoracic spine including fusion across the disc spaces at T4-5 and T5-6 and severe disc space narrowing at multiple other levels. Disc bulging, spurring, and facet and ligamentum flavum hypertrophy result in severe spinal stenosis and moderate cord flattening at T2-3 and mild-to-moderate spinal stenosis at T3-4 as seen on the recent cervical spine MRI. Disc bulging throughout the mid to lower thoracic spine results in up to mild spinal stenosis. There is moderate to severe multilevel neural foraminal stenosis in the upper and mid thoracic spine. MRI LUMBAR SPINE FINDINGS Axial images are mildly to moderately motion degraded. Segmentation: Standard. Alignment:  Grade 1 anterolisthesis of L3 on L4 and L4 on L5. Vertebrae: Previous Ray cage fusion at L4-5. No fracture, suspicious osseous lesion, or significant marrow edema, or evidence of discitis. Conus medullaris and cauda equina: Conus extends to the L1 level. Conus and cauda equina appear normal. Paraspinal and other soft tissues: Postoperative changes in the posterior lumbar soft tissues. No fluid collection.  Disc levels:  T11-12: Minimal disc bulging and moderate to severe facet hypertrophy result in borderline spinal stenosis without neural foraminal stenosis. T12-L1: Minimal disc bulging and moderate facet and ligamentum flavum hypertrophy without stenosis. L1-2: Minimal disc bulging and moderate facet and ligamentum flavum hypertrophy without stenosis. L2-3: Minimal disc bulging, congenitally short pedicles, and moderate facet and ligamentum flavum hypertrophy result in mild-to-moderate spinal stenosis without neural foraminal stenosis. L3-4: Moderate disc space narrowing. Anterolisthesis with bulging uncovered disc, congenitally short pedicles, and severe facet and ligamentum flavum hypertrophy result in severe spinal stenosis with complete effacement of the thecal sac and moderate to severe right and severe left neural foraminal stenosis. L4-5: Previous posterior decompression and fusion. Assessment is limited by susceptibility artifact although there appears to be moderate to severe spinal stenosis at the L4 vertebral body level related to anterolisthesis and posterior element hypertrophy. The spinal canal is patent at and below the disc space level. Susceptibility artifact obscures the left neural foramen with the cage potentially extending posteriorly into the foramen. There is severe right neural foraminal stenosis due to anterolisthesis and disc space height loss. L5-S1: Previous posterior decompression. 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 Endplate spurring results in mild bilateral neural foraminal stenosis without spinal stenosis. IMPRESSION: Thoracic spine: 1. Severely motion degraded examination. 2. 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 Suspected myelomalacia or mild cord edema at T2-3 as seen on the recent cervical spine MRI. Lumbar Spine: 1. Previous posterior decompression and fusion at L4-5 and L5-S1. Severe  right neural foraminal stenosis at L4-5. 2. Advanced facet and disc degeneration at L3-4 with grade 1 anterolisthesis and severe spinal and neural foraminal stenosis. Electronically Signed   By: Logan Bores M.D.   On: 09/27/2019 19:13   DG Knee Complete 4 Views Right  Result Date: 09/27/2019 CLINICAL DATA:  [place] yesterday, inability to bear weight EXAM: RIGHT KNEE - COMPLETE 4+ VIEW COMPARISON:  01/16/2014 FINDINGS: Frontal, bilateral oblique, lateral views of the right knee are obtained. Postsurgical changes are seen from prior right hip arthroplasty as well as ORIF of the tibia. There are no acute displaced fractures. Alignment is anatomic. No joint effusion. Diffuse vascular calcifications. IMPRESSION: 1. Extensive postsurgical changes as above. No acute bony abnormality. Electronically Signed   By: Randa Ngo M.D.   On: 09/27/2019 01:58   DG Ankle Right Port  Result Date: 09/27/2019 CLINICAL DATA:  Multiple falls, fell 09/26/2018, lateral ankle pain EXAM: PORTABLE RIGHT ANKLE - 2 VIEW COMPARISON:  Knee radiograph 09/27/2019, tibia/fibula radiograph 01/16/2014 FINDINGS: Postsurgical changes from prior open reduction internal fixation of a mid shaft tibia and fibular fractures with exuberant callus formation. The inferior-most screw of the lateral side plate fixation of the tibia is fractured. No other evidence of hardware failure or complication. Healed fracture deformity of the fibula with exuberant ossification across the displaced fracture defect. Radiolucent screw tracks are noted in the distal fibula. Evaluation is limited by suboptimal oblique mortise view. No definite acute fracture or traumatic malalignment is seen at the level of the ankle. There is a comminuted minimally displaced fracture involving the base of the fifth metatarsal. Associated soft tissue swelling is noted both at the ankle and lateral foot. There are moderate degenerative changes at the ankle. Additional degenerative  features noted in the mid and hindfoot as included in the level of imaging. Plantar calcaneal spur. Vascular calcification in the soft tissues. IMPRESSION: 1. Acute comminuted minimally displaced  fracture involving the base of the fifth metatarsal. Associated soft tissue swelling. 2. No discrete ankle fracture or traumatic malalignment is seen. Mild soft tissue swelling at the ankle without effusion. 3. Postsurgical changes from prior open reduction internal fixation of a mid shaft tibia fracture with hypertrophic callus formation. The inferior-most screw of the lateral side plate fixation of the tibia is fractured. No other evidence of hardware failure or complication. 4. Healed fracture deformity of the fibula with exuberant callus formation as well. Electronically Signed   By: Lovena Le M.D.   On: 09/27/2019 05:53   DG Hip Unilat W or Wo Pelvis 2-3 Views Right  Result Date: 09/27/2019 CLINICAL DATA:  [place] yesterday, inability to bear weight, pain EXAM: DG HIP (WITH OR WITHOUT PELVIS) 2-3V RIGHT COMPARISON:  None FINDINGS: Frontal view of the pelvis as well as frontal and cross-table lateral views of the right hip are obtained. No acute displaced fractures. Alignment is anatomic. Symmetrical bilateral hip osteoarthritis. Postsurgical changes at the lumbosacral junction. The remainder of the bony pelvis is unremarkable. IMPRESSION: 1. No acute fracture. 2. Symmetrical bilateral hip osteoarthritis. Electronically Signed   By: Randa Ngo M.D.   On: 09/27/2019 01:56      Subjective: Patient seen and examined at bedside, resting comfortably, easily arousable.  Patient's daughter, [name] at bedside.  Answered all questions.  At this time, she wishes to take her mother home in her care.  Discussed with her daughter that she has severe cervical canal stenosis that is causing cord compression and which is leading to her underlying weakness in her extremities.  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  All of which is causing her underlying weakness and likely will lead to paralysis unless surgically intervened, in which they declined at this time.  No other complaints or concerns at this time.  Discharging home with home health today.  Patient denies headache, no chest pain, no palpitations, no shortness of breath, no abdominal pain.  Discharge Exam: Vitals:   09/28/19 2029 09/29/19 0623  BP: (!) 154/63 (!) 131/55  Pulse: 80 64  Resp: 20 18  Temp: 98.3 F (36.8 C) 98.7 F (37.1 C)  SpO2: 97% 94%   Vitals:   09/28/19 0430 09/28/19 1456 09/28/19 2029 09/29/19 0623  BP: 122/69 (!) 109/51 (!) 154/63 (!) 131/55  Pulse: 61 63 80 64  Resp: 18 18 20 18   Temp: 99.7 F (37.6 C) 98 F (36.7 C) 98.3 F (36.8 C) 98.7 F (37.1 C)  TempSrc: Oral Oral Oral   SpO2:  97% 97% 94%  Weight:      Height:        General: Pt is alert, awake, not in acute distress Cardiovascular: RRR, S1/S2 +, no rubs, no gallops Respiratory: CTA bilaterally, no wheezing, no rhonchi Abdominal: Soft, NT, ND, bowel sounds + Extremities: no edema, no cyanosis    The results of significant diagnostics from this hospitalization (including imaging, microbiology, ancillary and laboratory) are listed below for reference.     Microbiology: Recent Results (from the past 240 hour(s))  SARS CORONAVIRUS 2 (TAT 6-24 HRS) Nasopharyngeal Nasopharyngeal Swab     Status: None   Collection Time: 09/27/19  4:03 AM   Specimen: Nasopharyngeal Swab  Result Value Ref Range Status   SARS Coronavirus 2 NEGATIVE NEGATIVE Final    Comment: (NOTE) SARS-CoV-2 target nucleic acids are NOT DETECTED. The SARS-CoV-2 RNA is generally detectable in upper and lower  respiratory specimens during the acute phase of infection. Negative results do not preclude SARS-CoV-2 infection, do not rule out co-infections with other pathogens, and should not be used as the sole basis for treatment or other  patient management decisions. Negative results must be combined with clinical observations, patient history, and epidemiological information. The expected result is Negative. Fact Sheet for Patients: SugarRoll.be Fact Sheet for Healthcare Providers: https://www.woods-mathews.com/ This test is not yet approved or cleared by the [place] FDA and  has been authorized for detection and/or diagnosis of SARS-CoV-2 by FDA under an Emergency Use Authorization (EUA). This EUA will remain  in effect (meaning this test can be used) for the duration of the COVID-19 declaration under Section 56 4(b)(1) of the Act, 21 U.S.C. section 360bbb-3(b)(1), unless the authorization is terminated or revoked sooner. Performed at [facility], [place] [address] [place], [place] [address]      Labs: BNP (last 3 results) No results for input(s): BNP in the last 8760 hours. Basic Metabolic Panel: Recent Labs  Lab 09/27/19 0258 09/28/19 0440  NA 140 144  K 4.3 4.3  CL 107 111  CO2 25 25  GLUCOSE 320* 81  BUN 32* 35*  CREATININE 1.07* 1.02*  CALCIUM 9.5 9.1   Liver Function Tests: Recent Labs  Lab 09/28/19 0440  AST 19  ALT 17  ALKPHOS 76  BILITOT 0.7  PROT 6.5  ALBUMIN 3.0*   No results for input(s): LIPASE, AMYLASE in the last 168 hours. No results for input(s): AMMONIA in the last 168 hours. CBC: Recent Labs  Lab 09/27/19 0258 09/28/19 0440  WBC 6.1 5.5  NEUTROABS 3.5  --   HGB 12.3 10.6*  HCT 38.5 33.6*  MCV 92.1 94.4  PLT 163 151   Cardiac Enzymes: No results for input(s): CKTOTAL, CKMB, CKMBINDEX, TROPONINI in the last 168 hours. BNP: Invalid input(s): POCBNP CBG: Recent Labs  Lab 09/28/19 0807 09/28/19 0841 09/28/19 1155 09/28/19 1613 09/28/19 2327  GLUCAP 57* 96 126* 191* 153*   D-Dimer No results for input(s): DDIMER in the last 72 hours. Hgb A1c Recent Labs    09/27/19 0258 09/28/19 0440  HGBA1C 10.0* 10.4*    Lipid Profile No results for input(s): CHOL, HDL, LDLCALC, TRIG, CHOLHDL, LDLDIRECT in the last 72 hours. Thyroid function studies No results for input(s): TSH, T4TOTAL, T3FREE, THYROIDAB in the last 72 hours.  Invalid input(s): FREET3 Anemia work up No results for input(s): VITAMINB12, FOLATE, FERRITIN, TIBC, IRON, RETICCTPCT in the last 72 hours. Urinalysis    Component Value Date/Time   COLORURINE YELLOW 09/27/2019 0352   APPEARANCEUR CLEAR 09/27/2019 0352   LABSPEC 1.017 09/27/2019 0352   PHURINE 5.0 09/27/2019 0352   GLUCOSEU >=500 (A) 09/27/2019 0352   GLUCOSEU NEGATIVE 11/02/2018 1122   HGBUR NEGATIVE 09/27/2019 0352   BILIRUBINUR NEGATIVE 09/27/2019 0352   KETONESUR NEGATIVE 09/27/2019 0352   PROTEINUR NEGATIVE 09/27/2019 0352   UROBILINOGEN 0.2 11/02/2018 1122   NITRITE NEGATIVE 09/27/2019 0352   LEUKOCYTESUR TRACE (A) 09/27/2019 0352   Sepsis Labs Invalid input(s): PROCALCITONIN,  WBC,  LACTICIDVEN Microbiology Recent Results (from the past 240 hour(s))  SARS CORONAVIRUS 2 (TAT 6-24 HRS) Nasopharyngeal Nasopharyngeal Swab     Status: None   Collection Time: 09/27/19  4:03 AM   Specimen: Nasopharyngeal Swab  Result Value Ref Range Status   SARS Coronavirus 2 NEGATIVE NEGATIVE Final    Comment: (NOTE) SARS-CoV-2 target nucleic acids are NOT DETECTED. The SARS-CoV-2 RNA is generally detectable in  upper and lower respiratory specimens during the acute phase of infection. Negative results do not preclude SARS-CoV-2 infection, do not rule out co-infections with other pathogens, and should not be used as the sole basis for treatment or other patient management decisions. Negative results must be combined with clinical observations, patient history, and epidemiological information. The expected result is Negative. Fact Sheet for Patients: SugarRoll.be Fact Sheet for Healthcare Providers: https://www.woods-mathews.com/ This  test is not yet approved or cleared by the [place] FDA and  has been authorized for detection and/or diagnosis of SARS-CoV-2 by FDA under an Emergency Use Authorization (EUA). This EUA will remain  in effect (meaning this test can be used) for the duration of the COVID-19 declaration under Section 56 4(b)(1) of the Act, 21 U.S.C. section 360bbb-3(b)(1), unless the authorization is terminated or revoked sooner. Performed at [facility], [place] [address], [place], [place] [address]      Time coordinating discharge: Over 30 minutes  SIGNED:   Donnamarie Poag [place] DO  [facility] 09/29/2019, 10:14 AM

## 2019-09-29 NOTE — TOC Transition Note (Signed)
Transition of Care [facility]) - CM/SW Discharge Note   Patient Details  Name: LAQUASHA NAVIA MRN: [id number] Date of Birth: 1940-09-27  Transition [facility] Care [facility]) CM/SW Contact:  Dessa Phi, RN Phone Number: 09/29/2019, 10:50 AM   Clinical Narrative: [facility]-cancelled [facility] for SNF.      Final next level of care: Home w Home Health Services Barriers to Discharge: No Barriers Identified   Patient Goals and CMS Choice        Discharge Placement                       Discharge Plan and Services                          HH Arranged: PT, OT, Nurse's Aide [place] Agency: [place] Date [place]: 09/29/19 Time [place]: 39 Representative spoke with at [place]: [place] Determinants of Health ([place]) Interventions     Readmission Risk Interventions No flowsheet data found.

## 2019-09-29 NOTE — Telephone Encounter (Signed)
Call to [facility] from [place] is in the ED after 2 falls. Neurosurgery contacted and she has severe spinal cord compress with stenosis. Pt condition will likely worsen and lead to paralyzation. DC from Hospital will be to SNF but pt may not be able to stay.   Arville Go stated that pt will have to be discharged from [facility] but can come back out after SNF/Rehab discharge.

## 2019-09-29 NOTE — Progress Notes (Signed)
Pt discharged to daughters home via [place]. Daughter at bedside during transport. AVS discussed with daughter and all questions answered.

## 2019-09-29 NOTE — Progress Notes (Signed)
    [facility] Consult Note Telephone: [phone]  Fax: [phone]  PATIENT NAME: Holly Weiss DOB: Sep 04, 1940 MRN: [id number]  PRIMARY CARE PROVIDER:   Biagio Borg, MD  REFERRING PROVIDER:  Biagio Borg, MD [place],  [place] [address]  RESPONSIBLE PARTY:  [name] (daughter) -  cell:  [phone]   NOTE:  Phoned daughter at above listed telephone number for communication with patient for a previously scheduled Palliative care telehealth appointment.  Daughter states and per medical record review, that patient is currently hospitalized due to weakness and multiple falls.  She is unsure of the length of stay in the hospital.  Palliative care will have hospital follow-up with patient and is available for future planning. Support given.  Daughter [name] thanked me for the call.     Gonzella Lex, NP-C

## 2019-09-29 NOTE — Consult Note (Signed)
[facility]--The home-based Palliative Care division of [facility]---Received referral on pt from [place] Team b/c of concerns that pt needed additional support in the home due to compromised primary caregiver and decline in pt's functional ability to care for herself.  Home admission visit made by SN on 09/26/19 and reached out to PCP office due to concerns of pt's increased weakness, frequent falls/pain, loss of manual dexterity to manage meds and blood sugars/insulin regimen, increased confusion.  Noted pt was subsequently hospitalized.  Will continue to follow hospital course. Please let [facility] know if we can assist with any d/c planning needs.  Wynetta Fines, RN  [phone]

## 2019-09-29 NOTE — TOC Transition Note (Signed)
Transition of Care [facility]) - CM/SW Discharge Note   Patient Details  Name: Holly Weiss MRN: [id number] Date of Birth: [birth year]  Transition of Care [facility]) CM/SW Contact:  Dessa Phi, RN Phone Number: 09/29/2019, 11:22 AM   Clinical Narrative:  Martha Clan table ordered-Dtr [place]ia Night agree to pick up in store-rep Thedore Mins will f/u on contacting dtr when overbed table ready for pick up-this is the most efficient way safe d/c. PTAR called.Nsg aware.     Final next level of care: [place] Barriers to Discharge: No Barriers Identified   Patient Goals and CMS Choice        Discharge Placement                       Discharge Plan and Services                DME Arranged: Overbed table DME Agency: [employer] Date DME Agency Contacted: 09/29/19 Time DME Agency Contacted: 1122 Representative spoke with at DME Agency: Thedore Mins HH Arranged: PT, OT, Nurse's Aide [place] Agency: [place] Date [place]: 09/29/19 Time [place]: 57 Representative spoke with at [place]: [place] ([place]) Interventions     Readmission Risk Interventions No flowsheet data found.

## 2019-09-29 NOTE — TOC Transition Note (Signed)
Transition of Care [facility]) - CM/SW Discharge Note   Patient Details  Name: NAYVEE GINDER MRN: [id number] Date of Birth: [birth year]  Transition of Care [facility]) CM/SW Contact:  Dessa Phi, RN Phone Number: 09/29/2019, 10:21 AM   Clinical Narrative:  Spoke to both dtrs in rm & on phone-Johanna in rm, Sonya on phone-they both agree to Home w/HHC-HHPT/OT/csw, & decline SNF-MD aware & agree to d/c plan-patient will stay w/Johanna-she has good support-address [address], [place] c#[phone]. PTAR for transport home.    Final next level of care: Home w Home Health Services Barriers to Discharge: No Barriers Identified   Patient Goals and CMS Choice        Discharge Placement                       Discharge Plan and Services                          HH Arranged: PT, OT, Nurse's Aide [place] Agency: [place] Date [place]: 09/29/19 Time [place]: 49 Representative spoke with at [place]: [place] Determinants of Health ([place]) Interventions     Readmission Risk Interventions No flowsheet data found.

## 2019-10-02 ENCOUNTER — Other Ambulatory Visit: Payer: Self-pay | Admitting: *Deleted

## 2019-10-02 NOTE — Patient Outreach (Signed)
[facility] ([facility]) Care Management  10/02/2019  Holly Weiss 08/17/1940 [id number]    Telephone Assessment  RN spoke with Fran ([facility]) to confirm it pt is active. Confirmation received with the initial home visit on 2/23. Agency aware pt recently had a hospitalization and was discharged on 2/26. Based on this external case management program pt will be discharged via [facility] services.  RN spoke with the pt primary caregiver to confirm pt's active status with [facility] and verified some pending goals that were met. Based upon the external case management program caregiver aware [facility] will close the pt's case from services and allow [facility] to continue servicing this pt. Caregiver inquired on a prescription refill. RN encouraged caregiver to contact the pt's provider to make this request if the pharmacy was not able to fill.   Plan: Case will be closed via [facility] services at this time.   , RN Care Management Coordinator [facility] Main Office [phone] 

## 2019-10-03 ENCOUNTER — Telehealth: Payer: Self-pay

## 2019-10-03 NOTE — Telephone Encounter (Signed)
New message   [facility] calling has a couple of questions   Verify medication changes from hospital discharges.   Humalog is discontinue.   Lancets units discrepancy  [facility] say 20 units, prescription Bottle says 90 units, patient reports  42 units a day.   Quetiapine is discontinued the patient & daughter are saying she needs to continue mediation does not know why it was discontinued.    Need a prescription for FREESTYLE LIBRE 14 DAY READER) DEVI .Marland Kitchen it's report was not called in   [facility] [address] [place]

## 2019-10-06 ENCOUNTER — Other Ambulatory Visit: Payer: Self-pay

## 2019-10-06 MED ORDER — FREESTYLE LIBRE 14 DAY READER DEVI: 1.0000 | Freq: Every day | 0 refills | Status: DC

## 2019-10-06 MED ORDER — FREESTYLE LIBRE 14 DAY SENSOR MISC: 1.0000 | 3 refills | Status: DC

## 2019-10-06 NOTE — Telephone Encounter (Signed)
Left message for Holly Weiss informing of PCP response.   I will have to do some digging on the pharmacy and needing the [facility].

## 2019-10-06 NOTE — Telephone Encounter (Signed)
Script faxed into for her [employer].  Regarding f/u appointment are you fine with doing virtual hospital follow up. Daughter [name] states she can't get her her due to transportation issues and being able to get her in and out of her car.  Are you fine with VV.  Burleigh Brockmann~

## 2019-10-06 NOTE — Telephone Encounter (Signed)
ok 

## 2019-10-06 NOTE — Telephone Encounter (Signed)
Ok to f/u with me as recommended per the d/c summary on her current meds, except  Ok to restart the seroquel as I cannot tell from the d/c summary why this was stopped

## 2019-10-09 ENCOUNTER — Other Ambulatory Visit: Payer: Self-pay | Admitting: Internal Medicine

## 2019-10-09 ENCOUNTER — Encounter: Payer: Self-pay | Admitting: Internal Medicine

## 2019-10-09 DIAGNOSIS — E119 Type 2 diabetes mellitus without complications: Secondary | ICD-10-CM

## 2019-10-09 DIAGNOSIS — I509 Heart failure, unspecified: Secondary | ICD-10-CM

## 2019-10-09 DIAGNOSIS — F0390 Unspecified dementia without behavioral disturbance: Secondary | ICD-10-CM

## 2019-10-09 DIAGNOSIS — G8929 Other chronic pain: Secondary | ICD-10-CM

## 2019-10-09 MED ORDER — TIZANIDINE HCL 2 MG PO TABS: 2.0000 mg | ORAL_TABLET | Freq: Four times a day (QID) | ORAL | 0 refills | Status: DC | PRN

## 2019-10-09 MED ORDER — ONETOUCH BASIC SYSTEM W/DEVICE KIT: PACK | 0 refills | Status: DC

## 2019-10-09 NOTE — Telephone Encounter (Signed)
Tried reaching patient at number listed in chart. Voicemail not set up and unable to leave a message.

## 2019-10-12 ENCOUNTER — Other Ambulatory Visit: Payer: Self-pay | Admitting: Internal Medicine

## 2019-10-12 NOTE — Telephone Encounter (Signed)
The first note indicates the family has a bed, so not sure what is really needed, thanks

## 2019-10-12 NOTE — Telephone Encounter (Signed)
Done hardcopy to Ms Aida Puffer

## 2019-10-13 NOTE — Telephone Encounter (Signed)
Ok unfortunately will need virtual visit I think

## 2019-10-13 NOTE — Telephone Encounter (Signed)
Ok I have addended the Jun 23 2019 visit, hopefully that will work

## 2019-10-16 ENCOUNTER — Encounter: Payer: Self-pay | Admitting: Internal Medicine

## 2019-10-17 ENCOUNTER — Ambulatory Visit: Payer: Medicare Other | Attending: Internal Medicine

## 2019-10-17 DIAGNOSIS — Z23 Encounter for immunization: Secondary | ICD-10-CM

## 2019-10-17 MED ORDER — FREESTYLE LIBRE 14 DAY READER DEVI: 1.0000 | Freq: Every day | 0 refills | Status: DC

## 2019-10-17 MED ORDER — FREESTYLE LIBRE 14 DAY SENSOR MISC: 1.0000 | 3 refills | Status: DC

## 2019-10-17 NOTE — Progress Notes (Signed)
   Covid-19 Vaccination Clinic  Name:  Holly Weiss    MRN: [id number] DOB: 19-Feb-1941  10/17/2019  Holly Weiss was observed post Covid-19 immunization for 15 minutes without incident. She was provided with Vaccine Information Sheet and instruction to access the V-Safe system.   Holly Weiss was instructed to call 911 with any severe reactions post vaccine: Marland Kitchen Difficulty breathing  . Swelling of face and throat  . A fast heartbeat  . A bad rash all over body  . Dizziness and weakness   Immunizations Administered    Name Date Dose VIS Date Route   Pfizer COVID-19 Vaccine 10/17/2019  1:44 PM 0.3 mL 07/14/2019 Intramuscular   Manufacturer: [place]   Lot: [id number]   [place]: [id number]

## 2019-10-25 ENCOUNTER — Ambulatory Visit (INDEPENDENT_AMBULATORY_CARE_PROVIDER_SITE_OTHER): Payer: Medicare Other | Admitting: Internal Medicine

## 2019-10-25 ENCOUNTER — Other Ambulatory Visit: Payer: Self-pay | Admitting: Internal Medicine

## 2019-10-25 ENCOUNTER — Encounter: Payer: Self-pay | Admitting: Internal Medicine

## 2019-10-25 DIAGNOSIS — F32A Depression, unspecified: Secondary | ICD-10-CM

## 2019-10-25 DIAGNOSIS — I509 Heart failure, unspecified: Secondary | ICD-10-CM | POA: Diagnosis not present

## 2019-10-25 DIAGNOSIS — J4520 Mild intermittent asthma, uncomplicated: Secondary | ICD-10-CM | POA: Diagnosis not present

## 2019-10-25 DIAGNOSIS — F0390 Unspecified dementia without behavioral disturbance: Secondary | ICD-10-CM

## 2019-10-25 DIAGNOSIS — M129 Arthropathy, unspecified: Secondary | ICD-10-CM

## 2019-10-25 DIAGNOSIS — F329 Major depressive disorder, single episode, unspecified: Secondary | ICD-10-CM | POA: Diagnosis not present

## 2019-10-25 NOTE — Assessment & Plan Note (Signed)
stable overall by history and exam, recent data reviewed with pt, and pt to continue medical treatment as before,  to f/u any worsening symptoms or concerns  

## 2019-10-25 NOTE — Assessment & Plan Note (Addendum)
For wheelchair asd,  to f/u any worsening symptoms or concerns  I spent 1 minutes in preparing to see the patient by review of recent labs, imaging and procedures, obtaining and reviewing separately obtained history, communicating with the patient and family or caregiver, ordering medications, tests or procedures, and documenting clinical information in the EHR including the differential Dx, treatment, and any further evaluation and other management of polyarthritis, asthma, depression

## 2019-10-25 NOTE — Progress Notes (Addendum)
Patient ID: DESA RECH, female   DOB: Nov 29, 1940, 79 y.o.   MRN: [id number]  Virtual Visit via Video Note  I connected with Holly Weiss on 10/25/19 at 10:20 AM EDT by a video enabled telemedicine application and verified that I am speaking with the correct person using two identifiers.  Location: Patient: at home Provider: at office   I discussed the limitations of evaluation and management by telemedicine and the availability of in person appointments. The patient expressed understanding and agreed to proceed.  History of Present Illness: Here with c/o worsening gait ability, not able to tolerate with cane or walker, without recent falls due to ongoing biateral wrosening knee pain despite prior bilateral TKR. Pt denies chest pain, increased sob or doe, wheezing, orthopnea, PND, increased LE swelling, palpitations, dizziness or syncope.  Pt denies new neurological symptoms such as new headache, or facial or extremity weakness or numbness   Pt denies polydipsia, polyuria,  [facility] for [facility] health.  Dementia overall stable symptomatically, and not assoc with behavioral changes such as hallucinations, paranoia, or agitation. Addendum:  Pt checks sugars once daily, pt does not need frequent adjustments by the patient based on these readings; pt is injecting once daily. Past Medical History:  Diagnosis Date  . ANEMIA-NOS 01/18/2008  . Arthritis   . 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 ASTHMA, WITH ACUTE EXACERBATION 11/29/2008  . Bilateral carpal tunnel syndrome   . Chronic pain syndrome 01/21/2010  . Depression   . DIABETIC  RETINOPATHY 05/30/2007  . DM, UNCOMPLICATED, TYPE II, UNCONTROLLED 05/30/2007  . Edema of both legs    takes Lasix  . Femur fracture, right ([place])   . GERD (gastroesophageal reflux disease)   . 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 History of blood transfusion    spine surgery  . History of kidney stones   . HYPERLIPIDEMIA 05/30/2007  . HYPERTENSION 10/31/2007  . LOW  BACK PAIN 01/18/2008  . Migraine headache   . MORBID OBESITY, HX OF 05/30/2007  . NEPHROLITHIASIS, HX OF 01/18/2008  . Numbness and tingling in hands   . Seasonal allergies   . Shortness of breath    with walking  . SHOULDER PAIN, BILATERAL 04/09/2009  . Spinal stenosis   . Urgency of urination    Past Surgical History:  Procedure Laterality Date  . BACK SURGERY    . CATARACT EXTRACTION    . CHOLECYSTECTOMY    . lumbar disc surgury    . ORIF TIBIA FRACTURE Right 01/16/2014   Procedure: RIGHT TIBIA REPAIR NON-UNION/MALUNION TIBIA WITH SLIDING GRAFT;  Surgeon: Renette Butters, MD;  Location: [facility];  Service: Orthopedics;  Laterality: Right;  . THORACIC FUSION    . TOTAL KNEE ARTHROPLASTY WITH REVISION COMPONENTS Right 07/12/2013   Procedure: TOTAL KNEE ARTHROPLASTY WITH TIBIA REVISION COMPONENTS;  Surgeon: Ninetta Lights, MD;  Location: [facility];  Service: Orthopedics;  Laterality: Right;  . TUBAL LIGATION      reports that she has never smoked. She uses smokeless tobacco. She reports that she does not drink alcohol or use drugs. family history includes Dementia in her mother; Depression in her sister; Diabetes in her mother and sister; Heart disease in her father and mother; Lung cancer in her father. Allergies  Allergen Reactions  . Adhesive [Tape] Itching  . Endocet [Oxycodone-Acetaminophen] Itching  . Latex Itching  . Oxycodone-Acetaminophen Itching   Current Outpatient Medications on File Prior to Visit  Medication Sig Dispense Refill  . albuterol (VENTOLIN HFA)  108 (90 Base) MCG/ACT inhaler INHALE 2 PUFFS INTO THE LUNGS TWICE DAILY AS NEEDED FOR WHEEZING OR SHORTNESS OF BREATH (Patient taking differently: Inhale 2 puffs into the lungs 2 (two) times daily as needed for wheezing or shortness of breath. ) 18 g 3  . amLODipine (NORVASC) 5 MG tablet Take 1 tablet (5 mg total) by mouth daily. 90 tablet 0  . aspirin 81 MG tablet Take 1 tablet (81 mg total) by mouth daily. 30 tablet 0  .  B-D ULTRAFINE III SHORT PEN 31G X 8 MM MISC USE TO CHECK BLOOD SUGERS TWICE DAILY (Patient not taking: Reported on 09/15/2019) 100 each 5  . Blood Glucose Monitoring Suppl ([place]) w/Device KIT Use as directed once daily to check blood sugar.  Diagnosis code 250.02 1 each 0  . Calcium Carbonate-Vitamin D (CALTRATE 600+D PO) Take 2 tablets by mouth daily.    . Continuous Blood Gluc Receiver (FREESTYLE LIBRE 14 DAY READER) DEVI Apply 1 Device topically daily. Use as directed daily E11.9 1 each 0  . Continuous Blood Gluc Sensor (FREESTYLE LIBRE 14 DAY SENSOR) MISC Apply 1 Device topically every 14 (fourteen) days. E11.9 6 each 3  . furosemide (LASIX) 40 MG tablet 1 tab by mouth once daily (Patient taking differently: Take 20 mg by mouth daily as needed for fluid. ) 90 tablet 3  . glucose blood (ONE TOUCH TEST STRIPS) test strip Use as directed once daily to check blood sugar.  Diagnosis code E11.9 (Patient not taking: Reported on 09/19/2019) 100 each 11  . HYDROcodone-acetaminophen (NORCO/VICODIN) 5-325 MG tablet Take 1 tablet by mouth every 6 (six) hours as needed for moderate pain. 20 tablet 0  . insulin glargine (LANTUS) 100 UNIT/ML injection Inject 0.2 mLs (20 Units total) into the skin daily. 10 mL 11  . Insulin Pen Needle (B-D ULTRAFINE III SHORT PEN) 31G X 8 MM MISC USE TO CHECK BLOOD SUGERS TWICE DAILY (Patient not taking: Reported on 09/19/2019) 100 each 3  . metoprolol succinate (TOPROL-XL) 100 MG 24 hr tablet TAKE 1 TABLET BY MOUTH EVERY DAY WITH OR IMMEDIATELY FOLLOWING A MEAL (Patient taking differently: Take 100 mg by mouth daily. WITH OR IMMEDIATELY FOLLOWING A MEAL) 90 tablet 3  . Multiple Vitamins-Minerals (ONE-A-DAY EXTRAS ANTIOXIDANT PO) Take 1 tablet by mouth daily.     . ONE TOUCH LANCETS MISC Use as directed once daily to check blood sugar. DX E11.09 (Patient not taking: Reported on 09/19/2019) 100 each 3   No current facility-administered medications on file prior to  visit.   Observations/Objective: Alert, NAD, appropriate mood and affect, resps normal, cn 2-12 intact, moves all 4s, no visible rash or swelling Lab Results  Component Value Date   WBC 5.5 09/28/2019   HGB 10.6 (L) 09/28/2019   HCT 33.6 (L) 09/28/2019   PLT 151 09/28/2019   GLUCOSE 81 09/28/2019   CHOL 147 06/19/2019   TRIG 113.0 06/19/2019   HDL 48.20 06/19/2019   LDLCALC 76 06/19/2019   ALT 17 09/28/2019   AST 19 09/28/2019   NA 144 09/28/2019   K 4.3 09/28/2019   CL 111 09/28/2019   CREATININE 1.02 (H) 09/28/2019   BUN 35 (H) 09/28/2019   CO2 25 09/28/2019   TSH 3.52 11/02/2018   INR 1.11 01/15/2014   HGBA1C 10.4 (H) 09/28/2019   MICROALBUR 4.6 (H) 11/02/2018   Assessment and Plan: See notes  Follow Up Instructions: See notes   I discussed the assessment and treatment plan  with the patient. The patient was provided an opportunity to ask questions and all were answered. The patient agreed with the plan and demonstrated an understanding of the instructions.   The patient was advised to call back or seek an in-person evaluation if the symptoms worsen or if the condition fails to improve as anticipated.   Cathlean Cower, MD

## 2019-10-25 NOTE — Patient Instructions (Signed)
We will send the order for the wheelchair to the home health  Please continue all other medications as before, and refills have been done if requested.  Please have the pharmacy call with any other refills you may need.  Please keep your appointments with your specialists as you may have planned

## 2019-10-26 ENCOUNTER — Telehealth: Payer: Self-pay

## 2019-10-26 NOTE — Telephone Encounter (Signed)
Mailed off [facility] paperwork

## 2019-10-27 NOTE — Addendum Note (Signed)
Addended by: Biagio Borg on: 10/27/2019 01:07 PM   Modules accepted: Orders

## 2019-10-28 ENCOUNTER — Encounter: Payer: Self-pay | Admitting: Internal Medicine

## 2019-10-28 DIAGNOSIS — R4020 Unspecified coma: Secondary | ICD-10-CM | POA: Diagnosis not present

## 2019-10-28 DIAGNOSIS — E162 Hypoglycemia, unspecified: Secondary | ICD-10-CM | POA: Diagnosis not present

## 2019-10-28 DIAGNOSIS — E161 Other hypoglycemia: Secondary | ICD-10-CM | POA: Diagnosis not present

## 2019-10-28 DIAGNOSIS — R231 Pallor: Secondary | ICD-10-CM | POA: Diagnosis not present

## 2019-10-28 DIAGNOSIS — R531 Weakness: Secondary | ICD-10-CM | POA: Diagnosis not present

## 2019-11-10 ENCOUNTER — Other Ambulatory Visit: Payer: Self-pay | Admitting: Internal Medicine

## 2019-11-15 ENCOUNTER — Telehealth: Payer: Self-pay

## 2019-11-15 ENCOUNTER — Encounter: Payer: Self-pay | Admitting: Internal Medicine

## 2019-11-15 NOTE — Telephone Encounter (Signed)
Dr. [name]- Please advise. 

## 2019-11-15 NOTE — Telephone Encounter (Signed)
New message    1. What type of DME ([place]) would like your provider to order? wheelchair  Who would like to get the DME from? Does not matter which agency  Last visit with PCP (>3 months requires and appointment for insurance to cover the cost = please schedule patient for visit to discuss medical necessity for DME)? 5.17.21    2. Need verbal order for PT/OT - Reason weakness in legs, transfer education,  Possible nursing assistance to assisted with ADL .

## 2019-11-15 NOTE — Telephone Encounter (Signed)
Pt was seen televisit 10/25/19 if that is acceptable and wheelchair was ordered at that visit  [facility] for verbals as reqeusted

## 2019-11-16 NOTE — Telephone Encounter (Signed)
Ok for verbals, thanks 

## 2019-11-16 NOTE — Telephone Encounter (Signed)
Dr. Jenny Reichmann I spoke with Olivia Mackie  nurse at [facility] and she said can we have an order written for the verbals so it can be faxed to [place] this is a company who has PT/OT.  [facility] the company Olivia Mackie works for [employer] have nurse aid care or PT/OT

## 2019-11-16 NOTE — Telephone Encounter (Addendum)
DME order for wheelchair was sent to [facility] on 10-30-2019 and 4-15-2021confirmation wasn't scanned into system. Will refax DME order to [facility] fax is [phone] fax confirmed. Spoke with sherry DME order will be sent to [place] for delivery of wheelchair.   Left voicemail for Tracey at [facility] with ok for verbal PT/OT orders

## 2019-11-17 ENCOUNTER — Other Ambulatory Visit: Payer: Self-pay | Admitting: Internal Medicine

## 2019-11-21 NOTE — Telephone Encounter (Signed)
Ok for verbal if this is ok 

## 2019-11-21 NOTE — Telephone Encounter (Signed)
Dr. Jenny Reichmann this patient needs an order for an nurse aid, and PT/OT.   This order can be sent to [facility] or [place].

## 2019-11-22 NOTE — Telephone Encounter (Signed)
Please see message we need an order

## 2019-11-22 NOTE — Telephone Encounter (Signed)
Looks like the wheelchair has been adressed already, thanks

## 2019-11-23 ENCOUNTER — Telehealth: Payer: Self-pay

## 2019-11-23 NOTE — Telephone Encounter (Signed)
New message   [name] called [employer] they voiced to her they have not received anything from the office they are looking for the following information   office notes  demographic insurance fax # [phone]  2. Need order PT/OT / AIDE for the home sent to any agency  The patient will be going to her Daughter's home in [place] for a while.

## 2019-11-26 ENCOUNTER — Other Ambulatory Visit: Payer: Self-pay | Admitting: Internal Medicine

## 2019-12-01 ENCOUNTER — Ambulatory Visit: Payer: Self-pay | Admitting: *Deleted

## 2019-12-05 NOTE — Telephone Encounter (Signed)
  F/u   Holly Weiss POA calling back checking on the status of the Wheelchair prescription.   [facility] [facility] has not received a prescription from Dr. Jenny Reichmann.   Please advise.

## 2019-12-06 ENCOUNTER — Telehealth: Payer: Self-pay | Admitting: Internal Medicine

## 2019-12-06 DIAGNOSIS — F0390 Unspecified dementia without behavioral disturbance: Secondary | ICD-10-CM

## 2019-12-06 DIAGNOSIS — F329 Major depressive disorder, single episode, unspecified: Secondary | ICD-10-CM

## 2019-12-06 DIAGNOSIS — I509 Heart failure, unspecified: Secondary | ICD-10-CM

## 2019-12-06 DIAGNOSIS — F32A Depression, unspecified: Secondary | ICD-10-CM

## 2019-12-06 NOTE — Telephone Encounter (Signed)
   Patients daughter calling to request assistance with getting in home PT/OT. She states no one has evaluated  her mother for services even after recommendation made. Daughter reached to [facility] - they do not have staff Was referral sent to  [facility] or [facility] as previously noted? Daughter requesting assistance

## 2019-12-06 NOTE — Telephone Encounter (Signed)
Per chart rx was faxed. Reprinted DME for wheelchair and faxed to [phone] as requested.Marland KitchenJohny Chess

## 2019-12-07 NOTE — Telephone Encounter (Signed)
I will need a referral put in for [facility] for PT/OT

## 2019-12-07 NOTE — Telephone Encounter (Signed)
I dont have any further influence here, perhaps the PCCs can assist?  OK to forward

## 2019-12-08 NOTE — Addendum Note (Signed)
Addended by: Biagio Borg on: 12/08/2019 12:57 PM   Modules accepted: Orders

## 2019-12-08 NOTE — Telephone Encounter (Signed)
Ok this is done 

## 2019-12-10 ENCOUNTER — Other Ambulatory Visit: Payer: Self-pay | Admitting: Internal Medicine

## 2019-12-11 DIAGNOSIS — I509 Heart failure, unspecified: Secondary | ICD-10-CM | POA: Diagnosis not present

## 2019-12-11 DIAGNOSIS — J4520 Mild intermittent asthma, uncomplicated: Secondary | ICD-10-CM | POA: Diagnosis not present

## 2019-12-15 DIAGNOSIS — M4802 Spinal stenosis, cervical region: Secondary | ICD-10-CM | POA: Diagnosis not present

## 2019-12-15 DIAGNOSIS — M4312 Spondylolisthesis, cervical region: Secondary | ICD-10-CM | POA: Diagnosis not present

## 2019-12-15 DIAGNOSIS — M4712 Other spondylosis with myelopathy, cervical region: Secondary | ICD-10-CM | POA: Diagnosis not present

## 2019-12-15 DIAGNOSIS — G8250 Quadriplegia, unspecified: Secondary | ICD-10-CM | POA: Diagnosis not present

## 2019-12-17 ENCOUNTER — Other Ambulatory Visit: Payer: Self-pay | Admitting: Internal Medicine

## 2019-12-18 ENCOUNTER — Ambulatory Visit: Payer: Medicare Other | Admitting: Internal Medicine

## 2019-12-19 ENCOUNTER — Telehealth: Payer: Self-pay

## 2019-12-19 NOTE — Telephone Encounter (Signed)
New message   POA Holly Weiss calling    Need an appeal with insurance company reason unable to do fingerpick  4 times a day Continuous Blood Gluc Sensor (FREESTYLE LIBRE 14 DAY SENSOR) MISC  The prescription had gone to [employer] name: [facility] [phone]

## 2019-12-21 ENCOUNTER — Other Ambulatory Visit: Payer: Self-pay | Admitting: Internal Medicine

## 2019-12-21 DIAGNOSIS — G8929 Other chronic pain: Secondary | ICD-10-CM

## 2019-12-21 DIAGNOSIS — F0390 Unspecified dementia without behavioral disturbance: Secondary | ICD-10-CM

## 2019-12-21 DIAGNOSIS — I509 Heart failure, unspecified: Secondary | ICD-10-CM

## 2019-12-26 NOTE — Telephone Encounter (Signed)
Spoke with pts daughter informed her that her insurance stated the pt or her POA has to call and start an appeal... I've also provided a number per the insurance I spoke to.

## 2019-12-27 NOTE — Telephone Encounter (Signed)
[place] for prior auth for [employer] as pt is unable to check her sugars at all by herself due to dementia  I have referred to [facility] on may 7 and may 20, so I decline to do for a third time;  please ask PCC's to address daughter concern about the particular [facility] agency and availability of pt/ot

## 2020-01-03 ENCOUNTER — Telehealth: Payer: Self-pay | Admitting: Internal Medicine

## 2020-01-03 NOTE — Telephone Encounter (Signed)
New Message:   [name] is calling with [facility] and wondering what is the status of the pt getting PT and OT with an agency. She also states the pt is unable to take her sugar due to being unable to use one hand. Please advise.

## 2020-01-05 NOTE — Telephone Encounter (Signed)
PA was done. Waiting on a response about the approval.  KEY: [id number]

## 2020-01-06 ENCOUNTER — Other Ambulatory Visit: Payer: Self-pay | Admitting: Internal Medicine

## 2020-01-08 ENCOUNTER — Encounter: Payer: Self-pay | Admitting: Internal Medicine

## 2020-01-11 DIAGNOSIS — J4520 Mild intermittent asthma, uncomplicated: Secondary | ICD-10-CM | POA: Diagnosis not present

## 2020-01-11 DIAGNOSIS — I509 Heart failure, unspecified: Secondary | ICD-10-CM | POA: Diagnosis not present

## 2020-01-21 ENCOUNTER — Other Ambulatory Visit: Payer: Self-pay | Admitting: Internal Medicine

## 2020-01-22 ENCOUNTER — Other Ambulatory Visit: Payer: Self-pay

## 2020-01-22 ENCOUNTER — Encounter: Payer: Self-pay | Admitting: Internal Medicine

## 2020-01-22 ENCOUNTER — Ambulatory Visit (INDEPENDENT_AMBULATORY_CARE_PROVIDER_SITE_OTHER): Payer: Medicare Other | Admitting: Internal Medicine

## 2020-01-22 VITALS — BP 118/82 | HR 62 | Temp 98.1°F | Ht 63.0 in

## 2020-01-22 DIAGNOSIS — N1831 Chronic kidney disease, stage 3a: Secondary | ICD-10-CM

## 2020-01-22 DIAGNOSIS — I10 Essential (primary) hypertension: Secondary | ICD-10-CM | POA: Diagnosis not present

## 2020-01-22 DIAGNOSIS — I5032 Chronic diastolic (congestive) heart failure: Secondary | ICD-10-CM

## 2020-01-22 DIAGNOSIS — E785 Hyperlipidemia, unspecified: Secondary | ICD-10-CM | POA: Diagnosis not present

## 2020-01-22 DIAGNOSIS — Z0001 Encounter for general adult medical examination with abnormal findings: Secondary | ICD-10-CM

## 2020-01-22 DIAGNOSIS — F0391 Unspecified dementia with behavioral disturbance: Secondary | ICD-10-CM | POA: Diagnosis not present

## 2020-01-22 DIAGNOSIS — F0392 Unspecified dementia, unspecified severity, with psychotic disturbance: Secondary | ICD-10-CM

## 2020-01-22 DIAGNOSIS — E1165 Type 2 diabetes mellitus with hyperglycemia: Secondary | ICD-10-CM

## 2020-01-22 DIAGNOSIS — E559 Vitamin D deficiency, unspecified: Secondary | ICD-10-CM

## 2020-01-22 DIAGNOSIS — E538 Deficiency of other specified B group vitamins: Secondary | ICD-10-CM

## 2020-01-22 DIAGNOSIS — Z794 Long term (current) use of insulin: Secondary | ICD-10-CM

## 2020-01-22 LAB — CBC WITH DIFFERENTIAL/PLATELET
Basophils Absolute: 0 10*3/uL (ref 0.0–0.1)
Basophils Relative: 0.7 % (ref 0.0–3.0)
Eosinophils Absolute: 0.4 10*3/uL (ref 0.0–0.7)
Eosinophils Relative: 7.5 % — ABNORMAL HIGH (ref 0.0–5.0)
HCT: 34 % — ABNORMAL LOW (ref 36.0–46.0)
Hemoglobin: 11.3 g/dL — ABNORMAL LOW (ref 12.0–15.0)
Lymphocytes Relative: 29.1 % (ref 12.0–46.0)
Lymphs Abs: 1.6 10*3/uL (ref 0.7–4.0)
MCHC: 33.3 g/dL (ref 30.0–36.0)
MCV: 89.8 fl (ref 78.0–100.0)
Monocytes Absolute: 0.6 10*3/uL (ref 0.1–1.0)
Monocytes Relative: 11 % (ref 3.0–12.0)
Neutro Abs: 2.8 10*3/uL (ref 1.4–7.7)
Neutrophils Relative %: 51.7 % (ref 43.0–77.0)
Platelets: 140 10*3/uL — ABNORMAL LOW (ref 150.0–400.0)
RBC: 3.78 Mil/uL — ABNORMAL LOW (ref 3.87–5.11)
RDW: 16.1 % — ABNORMAL HIGH (ref 11.5–15.5)
WBC: 5.5 10*3/uL (ref 4.0–10.5)

## 2020-01-22 LAB — HEPATIC FUNCTION PANEL
ALT: 18 U/L (ref 0–35)
AST: 18 U/L (ref 0–37)
Albumin: 3.6 g/dL (ref 3.5–5.2)
Alkaline Phosphatase: 85 U/L (ref 39–117)
Bilirubin, Direct: 0.1 mg/dL (ref 0.0–0.3)
Total Bilirubin: 0.4 mg/dL (ref 0.2–1.2)
Total Protein: 6.5 g/dL (ref 6.0–8.3)

## 2020-01-22 LAB — VITAMIN B12: Vitamin B-12: 696 pg/mL (ref 211–911)

## 2020-01-22 LAB — LIPID PANEL
Cholesterol: 137 mg/dL (ref 0–200)
HDL: 43.1 mg/dL (ref >39.00)
LDL Cholesterol: 82 mg/dL (ref 0–99)
NonHDL: 93.75
Total CHOL/HDL Ratio: 3
Triglycerides: 58 mg/dL (ref 0.0–149.0)
VLDL: 11.6 mg/dL (ref 0.0–40.0)

## 2020-01-22 LAB — VITAMIN D 25 HYDROXY (VIT D DEFICIENCY, FRACTURES): VITD: 99.6 ng/mL (ref 30.00–100.00)

## 2020-01-22 LAB — BASIC METABOLIC PANEL
BUN: 39 mg/dL — ABNORMAL HIGH (ref 6–23)
CO2: 26 mEq/L (ref 19–32)
Calcium: 9.9 mg/dL (ref 8.4–10.5)
Chloride: 108 mEq/L (ref 96–112)
Creatinine, Ser: 1.03 mg/dL (ref 0.40–1.20)
GFR: 62.53 mL/min (ref >60.00)
Glucose, Bld: 106 mg/dL — ABNORMAL HIGH (ref 70–99)
Potassium: 4.5 mEq/L (ref 3.5–5.1)
Sodium: 140 mEq/L (ref 135–145)

## 2020-01-22 LAB — MICROALBUMIN / CREATININE URINE RATIO
Creatinine,U: 99.8 mg/dL
Microalb Creat Ratio: 8.4 mg/g (ref 0.0–30.0)
Microalb, Ur: 8.3 mg/dL — ABNORMAL HIGH (ref 0.0–1.9)

## 2020-01-22 LAB — HEMOGLOBIN A1C: Hgb A1c MFr Bld: 7 % — ABNORMAL HIGH (ref 4.6–6.5)

## 2020-01-22 LAB — TSH: TSH: 1.2 u[IU]/mL (ref 0.35–4.50)

## 2020-01-22 MED ORDER — FREESTYLE LIBRE 14 DAY READER DEVI: 1.0000 | Freq: Every day | 0 refills | Status: DC

## 2020-01-22 MED ORDER — QUETIAPINE FUMARATE 100 MG PO TABS: 100.0000 mg | ORAL_TABLET | Freq: Every day | ORAL | 1 refills | Status: DC

## 2020-01-22 MED ORDER — FREESTYLE LIBRE 14 DAY SENSOR MISC: 1.0000 | 3 refills | Status: DC

## 2020-01-22 NOTE — Assessment & Plan Note (Signed)
stable overall by history and exam, recent data reviewed with pt, and pt to continue medical treatment as before,  to f/u any worsening symptoms or concerns  

## 2020-01-22 NOTE — Patient Instructions (Signed)
Ok to increase the seroquel to 100 mg at bedtime  Please continue all other medications as before, and refills have been done if requested.  Please have the pharmacy call with any other refills you may need.  Please continue your efforts at being more active, low cholesterol diet, and weight control.  Please keep your appointments with your specialists as you may have planned  Please go to the LAB at the blood drawing area for the tests to be done  You will be contacted by phone if any changes need to be made immediately.  Otherwise, you will receive a letter about your results with an explanation, but please check with MyChart first.  Please remember to sign up for MyChart if you have not done so, as this will be important to you in the future with finding out test results, communicating by private email, and scheduling acute appointments online when needed.  Please make an Appointment to return in 6 months, or sooner if needed

## 2020-01-22 NOTE — Progress Notes (Signed)
Subjective:    Patient ID: Holly Weiss, female    DOB: March 15, 1941, 79 y.o.   MRN: [id number]  HPI  Here for wellness and f/u;  Overall doing ok;  Pt denies Chest pain, worsening SOB, DOE, wheezing, orthopnea, PND, worsening LE edema, palpitations, dizziness or syncope.  Pt denies neurological change such as new headache, facial or extremity weakness.  Pt denies polydipsia, polyuria, or low sugar symptoms. Pt states overall good compliance with treatment and medications, good tolerability, and has been trying to follow appropriate diet.  Pt denies worsening depressive symptoms, suicidal ideation or panic. No fever, night sweats, wt loss, loss of appetite, or other constitutional symptoms.  Pt states good ability with ADL's, has low fall risk, home safety reviewed and adequate, no other significant changes in hearing or vision, and only occasionally active with exercise. Denies worsening reflux, abd pain, dysphagia, n/v, bowel change or blood, though did have painless nonbloody loose stools and diarrhea intermittent for several years, worse last wk again.  Dementia overall stable symptomatically, and not assoc with behavioral changes such as hallucinations, paranoia, or agitation.  DOes have increased difficulty with sleep - tends to sleep during the day.   Past Medical History:  Diagnosis Date   ANEMIA-NOS 01/18/2008   Arthritis    ARTHRITIS, GENERALIZED 08/18/2010   ASTHMA, WITH ACUTE EXACERBATION 11/29/2008   Bilateral carpal tunnel syndrome    Chronic pain syndrome 01/21/2010   Depression    DIABETIC  RETINOPATHY 85/46/2703   DM, UNCOMPLICATED, TYPE II, UNCONTROLLED 05/30/2007   Edema of both legs    takes Lasix   Femur fracture, right ([facility])    GERD (gastroesophageal reflux disease)    GLAUCOMA NOS 05/30/2007   History of blood transfusion    spine surgery   History of kidney stones    HYPERLIPIDEMIA 05/30/2007   HYPERTENSION 10/31/2007   LOW BACK PAIN 01/18/2008    Migraine headache    MORBID OBESITY, HX OF 05/30/2007   NEPHROLITHIASIS, HX OF 01/18/2008   Numbness and tingling in hands    Seasonal allergies    Shortness of breath    with walking   SHOULDER PAIN, BILATERAL 04/09/2009   Spinal stenosis    Urgency of urination    Past Surgical History:  Procedure Laterality Date   BACK SURGERY     CATARACT EXTRACTION     CHOLECYSTECTOMY     lumbar disc surgury     ORIF TIBIA FRACTURE Right 01/16/2014   Procedure: RIGHT TIBIA REPAIR NON-UNION/MALUNION TIBIA WITH SLIDING GRAFT;  Surgeon: Renette Butters, MD;  Location: [facility];  Service: Orthopedics;  Laterality: Right;   THORACIC FUSION     TOTAL KNEE ARTHROPLASTY WITH REVISION COMPONENTS Right 07/12/2013   Procedure: TOTAL KNEE ARTHROPLASTY WITH TIBIA REVISION COMPONENTS;  Surgeon: Ninetta Lights, MD;  Location: [facility];  Service: Orthopedics;  Laterality: Right;   TUBAL LIGATION      reports that she has never smoked. She uses smokeless tobacco. She reports that she does not drink alcohol and does not use drugs. family history includes Dementia in her mother; Depression in her sister; Diabetes in her mother and sister; Heart disease in her father and mother; Lung cancer in her father. Allergies  Allergen Reactions   Adhesive [Tape] Itching   Endocet [Oxycodone-Acetaminophen] Itching   Latex Itching   Oxycodone-Acetaminophen Itching   Current Outpatient Medications on File Prior to Visit  Medication Sig Dispense Refill   albuterol (VENTOLIN HFA) 108 (90 Base)  MCG/ACT inhaler INHALE 2 PUFFS INTO THE LUNGS TWICE DAILY AS NEEDED FOR WHEEZING OR SHORTNESS OF BREATH (Patient taking differently: Inhale 2 puffs into the lungs 2 (two) times daily as needed for wheezing or shortness of breath. ) 18 g 3   aspirin 81 MG tablet Take 1 tablet (81 mg total) by mouth daily. 30 tablet 0   Blood Glucose Monitoring Suppl ([place]) w/Device KIT Use as directed once daily to check  blood sugar.  Diagnosis code 250.02 1 each 0   Calcium Carbonate-Vitamin D (CALTRATE 600+D PO) Take 2 tablets by mouth daily.     furosemide (LASIX) 40 MG tablet 1 tab by mouth once daily (Patient taking differently: Take 20 mg by mouth daily as needed for fluid. ) 90 tablet 3   glucose blood (ONE TOUCH TEST STRIPS) test strip Use as directed once daily to check blood sugar.  Diagnosis code E11.9 100 each 11   HUMALOG KWIKPEN 100 UNIT/ML KwikPen SMARTSIG:15 Unit(s) SUB-Q Every Evening     insulin glargine (LANTUS) 100 UNIT/ML injection Inject 0.2 mLs (20 Units total) into the skin daily. 10 mL 11   Insulin Pen Needle (B-D ULTRAFINE III SHORT PEN) 31G X 8 MM MISC USE TO CHECK BLOOD SUGERS TWICE DAILY 100 each 3   metoprolol succinate (TOPROL-XL) 100 MG 24 hr tablet TAKE 1 TABLET BY MOUTH EVERY DAY WITH OR IMMEDIATELY FOLLOWING A MEAL (Patient taking differently: Take 100 mg by mouth daily. WITH OR IMMEDIATELY FOLLOWING A MEAL) 90 tablet 3   Multiple Vitamins-Minerals (ONE-A-DAY EXTRAS ANTIOXIDANT PO) Take 1 tablet by mouth daily.      ONE TOUCH LANCETS MISC Use as directed once daily to check blood sugar. DX E11.09 100 each 3   rosuvastatin (CRESTOR) 20 MG tablet Take 10 mg by mouth daily.     tiZANidine (ZANAFLEX) 2 MG tablet TAKE 1 TABLET(2 MG) BY MOUTH EVERY 6 HOURS AS NEEDED FOR MUSCLE SPASMS 40 tablet 0   No current facility-administered medications on file prior to visit.   Review of Systems All otherwise neg per pt    Objective:   Physical Exam BP 118/82 (BP Location: Left Arm, Patient Position: Sitting, Cuff Size: Large)    Pulse 62    Temp 98.1 F (36.7 C) (Oral)    Ht _0  (1.6 m)    SpO2 95%    BMI 32.41 kg/m  VS noted,  Constitutional: Pt appears in NAD HENT: Head: NCAT.  Right Ear: External ear normal.  Left Ear: External ear normal.  Eyes: . Pupils are equal, round, and reactive to light. Conjunctivae and EOM are normal Nose: without d/c or deformity Neck: Neck  supple. Gross normal ROM Cardiovascular: Normal rate and regular rhythm.   Pulmonary/Chest: Effort normal and breath sounds without rales or wheezing.  Abd:  Soft, NT, ND, + BS, no organomegaly Neurological: Pt is alert. At baseline orientation, motor grossly intact Skin: Skin is warm. No rashes, other new lesions, trace bialteral LE edema Psychiatric: Pt behavior is normal without agitation  All otherwise neg per pt Lab Results  Component Value Date   WBC 5.5 01/22/2020   HGB 11.3 (L) 01/22/2020   HCT 34.0 (L) 01/22/2020   PLT 140.0 (L) 01/22/2020   GLUCOSE 106 (H) 01/22/2020   CHOL 137 01/22/2020   TRIG 58.0 01/22/2020   HDL 43.10 01/22/2020   LDLCALC 82 01/22/2020   ALT 18 01/22/2020   AST 18 01/22/2020   NA 140 01/22/2020  K 4.5 01/22/2020   CL 108 01/22/2020   CREATININE 1.03 01/22/2020   BUN 39 (H) 01/22/2020   CO2 26 01/22/2020   TSH 1.20 01/22/2020   INR 1.11 01/15/2014   HGBA1C 7.0 (H) 01/22/2020   MICROALBUR 4.6 (H) 11/02/2018      Assessment & Plan:

## 2020-01-22 NOTE — Assessment & Plan Note (Signed)

## 2020-01-22 NOTE — Assessment & Plan Note (Addendum)
stable overall by history and exam, recent data reviewed with pt, and pt to continue medical treatment as before,  to f/u any worsening symptoms or concerns  Ok for increased sseroquel 100 qhs  I spent 31 minutes in preparing to see the patient by review of recent labs, imaging and procedures, obtaining and reviewing separately obtained history, communicating with the patient and family or caregiver, ordering medications, tests or procedures, and documenting clinical information in the EHR including the differential Dx, treatment, and any further evaluation and other management of dementia, chf, htn,  Ckd, hld, dm,

## 2020-01-23 LAB — URINALYSIS, ROUTINE W REFLEX MICROSCOPIC
Bilirubin Urine: NEGATIVE
Hgb urine dipstick: NEGATIVE
Ketones, ur: NEGATIVE
Nitrite: NEGATIVE
Specific Gravity, Urine: 1.025 (ref 1.000–1.030)
Urine Glucose: NEGATIVE
Urobilinogen, UA: 0.2 (ref 0.0–1.0)
pH: 5.5 (ref 5.0–8.0)

## 2020-01-24 DIAGNOSIS — M159 Polyosteoarthritis, unspecified: Secondary | ICD-10-CM | POA: Diagnosis not present

## 2020-01-24 DIAGNOSIS — M4802 Spinal stenosis, cervical region: Secondary | ICD-10-CM | POA: Diagnosis not present

## 2020-01-24 DIAGNOSIS — J4520 Mild intermittent asthma, uncomplicated: Secondary | ICD-10-CM | POA: Diagnosis not present

## 2020-01-24 DIAGNOSIS — Z951 Presence of aortocoronary bypass graft: Secondary | ICD-10-CM | POA: Diagnosis not present

## 2020-01-24 DIAGNOSIS — E1122 Type 2 diabetes mellitus with diabetic chronic kidney disease: Secondary | ICD-10-CM | POA: Diagnosis not present

## 2020-01-24 DIAGNOSIS — E785 Hyperlipidemia, unspecified: Secondary | ICD-10-CM | POA: Diagnosis not present

## 2020-01-24 DIAGNOSIS — Z96651 Presence of right artificial knee joint: Secondary | ICD-10-CM | POA: Diagnosis not present

## 2020-01-24 DIAGNOSIS — I5042 Chronic combined systolic (congestive) and diastolic (congestive) heart failure: Secondary | ICD-10-CM | POA: Diagnosis not present

## 2020-01-24 DIAGNOSIS — M810 Age-related osteoporosis without current pathological fracture: Secondary | ICD-10-CM | POA: Diagnosis not present

## 2020-01-24 DIAGNOSIS — I130 Hypertensive heart and chronic kidney disease with heart failure and stage 1 through stage 4 chronic kidney disease, or unspecified chronic kidney disease: Secondary | ICD-10-CM | POA: Diagnosis not present

## 2020-01-24 DIAGNOSIS — H9191 Unspecified hearing loss, right ear: Secondary | ICD-10-CM | POA: Diagnosis not present

## 2020-01-24 DIAGNOSIS — Z8781 Personal history of (healed) traumatic fracture: Secondary | ICD-10-CM | POA: Diagnosis not present

## 2020-01-24 DIAGNOSIS — E11319 Type 2 diabetes mellitus with unspecified diabetic retinopathy without macular edema: Secondary | ICD-10-CM | POA: Diagnosis not present

## 2020-01-24 DIAGNOSIS — H409 Unspecified glaucoma: Secondary | ICD-10-CM | POA: Diagnosis not present

## 2020-01-24 DIAGNOSIS — Z794 Long term (current) use of insulin: Secondary | ICD-10-CM | POA: Diagnosis not present

## 2020-01-24 DIAGNOSIS — G894 Chronic pain syndrome: Secondary | ICD-10-CM | POA: Diagnosis not present

## 2020-01-24 DIAGNOSIS — N183 Chronic kidney disease, stage 3 unspecified: Secondary | ICD-10-CM | POA: Diagnosis not present

## 2020-01-24 DIAGNOSIS — I2510 Atherosclerotic heart disease of native coronary artery without angina pectoris: Secondary | ICD-10-CM | POA: Diagnosis not present

## 2020-01-24 DIAGNOSIS — D631 Anemia in chronic kidney disease: Secondary | ICD-10-CM | POA: Diagnosis not present

## 2020-01-24 DIAGNOSIS — Z993 Dependence on wheelchair: Secondary | ICD-10-CM | POA: Diagnosis not present

## 2020-01-24 DIAGNOSIS — G43909 Migraine, unspecified, not intractable, without status migrainosus: Secondary | ICD-10-CM | POA: Diagnosis not present

## 2020-01-25 ENCOUNTER — Telehealth: Payer: Self-pay | Admitting: Internal Medicine

## 2020-01-25 DIAGNOSIS — Z794 Long term (current) use of insulin: Secondary | ICD-10-CM | POA: Diagnosis not present

## 2020-01-25 DIAGNOSIS — Z951 Presence of aortocoronary bypass graft: Secondary | ICD-10-CM | POA: Diagnosis not present

## 2020-01-25 DIAGNOSIS — M159 Polyosteoarthritis, unspecified: Secondary | ICD-10-CM | POA: Diagnosis not present

## 2020-01-25 DIAGNOSIS — Z96651 Presence of right artificial knee joint: Secondary | ICD-10-CM | POA: Diagnosis not present

## 2020-01-25 DIAGNOSIS — H409 Unspecified glaucoma: Secondary | ICD-10-CM | POA: Diagnosis not present

## 2020-01-25 DIAGNOSIS — I5042 Chronic combined systolic (congestive) and diastolic (congestive) heart failure: Secondary | ICD-10-CM | POA: Diagnosis not present

## 2020-01-25 DIAGNOSIS — J4520 Mild intermittent asthma, uncomplicated: Secondary | ICD-10-CM | POA: Diagnosis not present

## 2020-01-25 DIAGNOSIS — E1122 Type 2 diabetes mellitus with diabetic chronic kidney disease: Secondary | ICD-10-CM | POA: Diagnosis not present

## 2020-01-25 DIAGNOSIS — Z993 Dependence on wheelchair: Secondary | ICD-10-CM | POA: Diagnosis not present

## 2020-01-25 DIAGNOSIS — H9191 Unspecified hearing loss, right ear: Secondary | ICD-10-CM | POA: Diagnosis not present

## 2020-01-25 DIAGNOSIS — I2510 Atherosclerotic heart disease of native coronary artery without angina pectoris: Secondary | ICD-10-CM | POA: Diagnosis not present

## 2020-01-25 DIAGNOSIS — G43909 Migraine, unspecified, not intractable, without status migrainosus: Secondary | ICD-10-CM | POA: Diagnosis not present

## 2020-01-25 DIAGNOSIS — M4802 Spinal stenosis, cervical region: Secondary | ICD-10-CM | POA: Diagnosis not present

## 2020-01-25 DIAGNOSIS — Z8781 Personal history of (healed) traumatic fracture: Secondary | ICD-10-CM | POA: Diagnosis not present

## 2020-01-25 DIAGNOSIS — G894 Chronic pain syndrome: Secondary | ICD-10-CM | POA: Diagnosis not present

## 2020-01-25 DIAGNOSIS — E11319 Type 2 diabetes mellitus with unspecified diabetic retinopathy without macular edema: Secondary | ICD-10-CM | POA: Diagnosis not present

## 2020-01-25 DIAGNOSIS — M810 Age-related osteoporosis without current pathological fracture: Secondary | ICD-10-CM | POA: Diagnosis not present

## 2020-01-25 DIAGNOSIS — I130 Hypertensive heart and chronic kidney disease with heart failure and stage 1 through stage 4 chronic kidney disease, or unspecified chronic kidney disease: Secondary | ICD-10-CM | POA: Diagnosis not present

## 2020-01-25 DIAGNOSIS — D631 Anemia in chronic kidney disease: Secondary | ICD-10-CM | POA: Diagnosis not present

## 2020-01-25 DIAGNOSIS — E785 Hyperlipidemia, unspecified: Secondary | ICD-10-CM | POA: Diagnosis not present

## 2020-01-25 DIAGNOSIS — N183 Chronic kidney disease, stage 3 unspecified: Secondary | ICD-10-CM | POA: Diagnosis not present

## 2020-01-25 NOTE — Telephone Encounter (Signed)
° °  Rosann Auerbach from [facility] requesting verbal orders for OT 1x week every other week 1x week for 3 weeks  Phone [phone]

## 2020-01-26 ENCOUNTER — Encounter: Payer: Self-pay | Admitting: Internal Medicine

## 2020-01-26 DIAGNOSIS — D631 Anemia in chronic kidney disease: Secondary | ICD-10-CM | POA: Diagnosis not present

## 2020-01-26 DIAGNOSIS — Z96651 Presence of right artificial knee joint: Secondary | ICD-10-CM | POA: Diagnosis not present

## 2020-01-26 DIAGNOSIS — E11319 Type 2 diabetes mellitus with unspecified diabetic retinopathy without macular edema: Secondary | ICD-10-CM | POA: Diagnosis not present

## 2020-01-26 DIAGNOSIS — H409 Unspecified glaucoma: Secondary | ICD-10-CM | POA: Diagnosis not present

## 2020-01-26 DIAGNOSIS — M4802 Spinal stenosis, cervical region: Secondary | ICD-10-CM | POA: Diagnosis not present

## 2020-01-26 DIAGNOSIS — E785 Hyperlipidemia, unspecified: Secondary | ICD-10-CM | POA: Diagnosis not present

## 2020-01-26 DIAGNOSIS — I5042 Chronic combined systolic (congestive) and diastolic (congestive) heart failure: Secondary | ICD-10-CM | POA: Diagnosis not present

## 2020-01-26 DIAGNOSIS — I130 Hypertensive heart and chronic kidney disease with heart failure and stage 1 through stage 4 chronic kidney disease, or unspecified chronic kidney disease: Secondary | ICD-10-CM | POA: Diagnosis not present

## 2020-01-26 DIAGNOSIS — I2510 Atherosclerotic heart disease of native coronary artery without angina pectoris: Secondary | ICD-10-CM | POA: Diagnosis not present

## 2020-01-26 DIAGNOSIS — Z951 Presence of aortocoronary bypass graft: Secondary | ICD-10-CM | POA: Diagnosis not present

## 2020-01-26 DIAGNOSIS — M159 Polyosteoarthritis, unspecified: Secondary | ICD-10-CM | POA: Diagnosis not present

## 2020-01-26 DIAGNOSIS — H9191 Unspecified hearing loss, right ear: Secondary | ICD-10-CM | POA: Diagnosis not present

## 2020-01-26 DIAGNOSIS — Z8781 Personal history of (healed) traumatic fracture: Secondary | ICD-10-CM | POA: Diagnosis not present

## 2020-01-26 DIAGNOSIS — Z993 Dependence on wheelchair: Secondary | ICD-10-CM | POA: Diagnosis not present

## 2020-01-26 DIAGNOSIS — J4520 Mild intermittent asthma, uncomplicated: Secondary | ICD-10-CM | POA: Diagnosis not present

## 2020-01-26 DIAGNOSIS — G43909 Migraine, unspecified, not intractable, without status migrainosus: Secondary | ICD-10-CM | POA: Diagnosis not present

## 2020-01-26 DIAGNOSIS — N183 Chronic kidney disease, stage 3 unspecified: Secondary | ICD-10-CM | POA: Diagnosis not present

## 2020-01-26 DIAGNOSIS — G894 Chronic pain syndrome: Secondary | ICD-10-CM | POA: Diagnosis not present

## 2020-01-26 DIAGNOSIS — E1122 Type 2 diabetes mellitus with diabetic chronic kidney disease: Secondary | ICD-10-CM | POA: Diagnosis not present

## 2020-01-26 DIAGNOSIS — Z794 Long term (current) use of insulin: Secondary | ICD-10-CM | POA: Diagnosis not present

## 2020-01-26 DIAGNOSIS — M810 Age-related osteoporosis without current pathological fracture: Secondary | ICD-10-CM | POA: Diagnosis not present

## 2020-01-26 MED ORDER — ONDANSETRON HCL 4 MG PO TABS: 4.0000 mg | ORAL_TABLET | Freq: Three times a day (TID) | ORAL | 0 refills | Status: DC | PRN

## 2020-01-26 NOTE — Telephone Encounter (Signed)
LVM for Holly Weiss with verbal okay.

## 2020-01-26 NOTE — Telephone Encounter (Signed)
Ok for verbals 

## 2020-01-26 NOTE — Telephone Encounter (Signed)
steph or lucy to see pt message, may need sat clinic appt virtual

## 2020-01-30 ENCOUNTER — Telehealth: Payer: Self-pay | Admitting: Internal Medicine

## 2020-01-30 NOTE — Telephone Encounter (Signed)
Sent to Dr. [name]. 

## 2020-01-30 NOTE — Telephone Encounter (Signed)
LDVM for Holly Weiss with verbal ok for the orders of Skilled nurse 1x for 5 weeks PT/OT evaluation Home Health aid 2x for 3 weeks

## 2020-01-30 NOTE — Telephone Encounter (Signed)
New message:     Anne Ng is calling with [facility] and is requesting some verbal orders. Please advise. Skilled nurse 1x for 5 weeks PT/OT evaluation Home Health aid 2x for 3 weeks

## 2020-01-30 NOTE — Telephone Encounter (Signed)
Ok for verbals 

## 2020-01-31 DIAGNOSIS — G43909 Migraine, unspecified, not intractable, without status migrainosus: Secondary | ICD-10-CM | POA: Diagnosis not present

## 2020-01-31 DIAGNOSIS — I2510 Atherosclerotic heart disease of native coronary artery without angina pectoris: Secondary | ICD-10-CM | POA: Diagnosis not present

## 2020-01-31 DIAGNOSIS — H409 Unspecified glaucoma: Secondary | ICD-10-CM | POA: Diagnosis not present

## 2020-01-31 DIAGNOSIS — E11319 Type 2 diabetes mellitus with unspecified diabetic retinopathy without macular edema: Secondary | ICD-10-CM | POA: Diagnosis not present

## 2020-01-31 DIAGNOSIS — M159 Polyosteoarthritis, unspecified: Secondary | ICD-10-CM | POA: Diagnosis not present

## 2020-01-31 DIAGNOSIS — I5042 Chronic combined systolic (congestive) and diastolic (congestive) heart failure: Secondary | ICD-10-CM | POA: Diagnosis not present

## 2020-01-31 DIAGNOSIS — Z8781 Personal history of (healed) traumatic fracture: Secondary | ICD-10-CM | POA: Diagnosis not present

## 2020-01-31 DIAGNOSIS — I130 Hypertensive heart and chronic kidney disease with heart failure and stage 1 through stage 4 chronic kidney disease, or unspecified chronic kidney disease: Secondary | ICD-10-CM | POA: Diagnosis not present

## 2020-01-31 DIAGNOSIS — Z96651 Presence of right artificial knee joint: Secondary | ICD-10-CM | POA: Diagnosis not present

## 2020-01-31 DIAGNOSIS — G894 Chronic pain syndrome: Secondary | ICD-10-CM | POA: Diagnosis not present

## 2020-01-31 DIAGNOSIS — D631 Anemia in chronic kidney disease: Secondary | ICD-10-CM | POA: Diagnosis not present

## 2020-01-31 DIAGNOSIS — E785 Hyperlipidemia, unspecified: Secondary | ICD-10-CM | POA: Diagnosis not present

## 2020-01-31 DIAGNOSIS — Z951 Presence of aortocoronary bypass graft: Secondary | ICD-10-CM | POA: Diagnosis not present

## 2020-01-31 DIAGNOSIS — N183 Chronic kidney disease, stage 3 unspecified: Secondary | ICD-10-CM | POA: Diagnosis not present

## 2020-01-31 DIAGNOSIS — J4520 Mild intermittent asthma, uncomplicated: Secondary | ICD-10-CM | POA: Diagnosis not present

## 2020-01-31 DIAGNOSIS — E1122 Type 2 diabetes mellitus with diabetic chronic kidney disease: Secondary | ICD-10-CM | POA: Diagnosis not present

## 2020-01-31 DIAGNOSIS — M810 Age-related osteoporosis without current pathological fracture: Secondary | ICD-10-CM | POA: Diagnosis not present

## 2020-01-31 DIAGNOSIS — Z794 Long term (current) use of insulin: Secondary | ICD-10-CM | POA: Diagnosis not present

## 2020-01-31 DIAGNOSIS — M4802 Spinal stenosis, cervical region: Secondary | ICD-10-CM | POA: Diagnosis not present

## 2020-01-31 DIAGNOSIS — Z993 Dependence on wheelchair: Secondary | ICD-10-CM | POA: Diagnosis not present

## 2020-01-31 DIAGNOSIS — H9191 Unspecified hearing loss, right ear: Secondary | ICD-10-CM | POA: Diagnosis not present

## 2020-02-02 DIAGNOSIS — E11319 Type 2 diabetes mellitus with unspecified diabetic retinopathy without macular edema: Secondary | ICD-10-CM | POA: Diagnosis not present

## 2020-02-02 DIAGNOSIS — N183 Chronic kidney disease, stage 3 unspecified: Secondary | ICD-10-CM | POA: Diagnosis not present

## 2020-02-02 DIAGNOSIS — E785 Hyperlipidemia, unspecified: Secondary | ICD-10-CM | POA: Diagnosis not present

## 2020-02-02 DIAGNOSIS — E1122 Type 2 diabetes mellitus with diabetic chronic kidney disease: Secondary | ICD-10-CM | POA: Diagnosis not present

## 2020-02-02 DIAGNOSIS — M159 Polyosteoarthritis, unspecified: Secondary | ICD-10-CM | POA: Diagnosis not present

## 2020-02-02 DIAGNOSIS — D631 Anemia in chronic kidney disease: Secondary | ICD-10-CM | POA: Diagnosis not present

## 2020-02-02 DIAGNOSIS — I130 Hypertensive heart and chronic kidney disease with heart failure and stage 1 through stage 4 chronic kidney disease, or unspecified chronic kidney disease: Secondary | ICD-10-CM | POA: Diagnosis not present

## 2020-02-02 DIAGNOSIS — M810 Age-related osteoporosis without current pathological fracture: Secondary | ICD-10-CM | POA: Diagnosis not present

## 2020-02-02 DIAGNOSIS — H9191 Unspecified hearing loss, right ear: Secondary | ICD-10-CM | POA: Diagnosis not present

## 2020-02-02 DIAGNOSIS — I5042 Chronic combined systolic (congestive) and diastolic (congestive) heart failure: Secondary | ICD-10-CM | POA: Diagnosis not present

## 2020-02-02 DIAGNOSIS — I2510 Atherosclerotic heart disease of native coronary artery without angina pectoris: Secondary | ICD-10-CM | POA: Diagnosis not present

## 2020-02-02 DIAGNOSIS — J4520 Mild intermittent asthma, uncomplicated: Secondary | ICD-10-CM | POA: Diagnosis not present

## 2020-02-02 DIAGNOSIS — M4802 Spinal stenosis, cervical region: Secondary | ICD-10-CM | POA: Diagnosis not present

## 2020-02-02 DIAGNOSIS — H409 Unspecified glaucoma: Secondary | ICD-10-CM | POA: Diagnosis not present

## 2020-02-02 DIAGNOSIS — Z794 Long term (current) use of insulin: Secondary | ICD-10-CM | POA: Diagnosis not present

## 2020-02-02 DIAGNOSIS — Z951 Presence of aortocoronary bypass graft: Secondary | ICD-10-CM | POA: Diagnosis not present

## 2020-02-02 DIAGNOSIS — Z993 Dependence on wheelchair: Secondary | ICD-10-CM | POA: Diagnosis not present

## 2020-02-02 DIAGNOSIS — G894 Chronic pain syndrome: Secondary | ICD-10-CM | POA: Diagnosis not present

## 2020-02-02 DIAGNOSIS — G43909 Migraine, unspecified, not intractable, without status migrainosus: Secondary | ICD-10-CM | POA: Diagnosis not present

## 2020-02-02 DIAGNOSIS — Z96651 Presence of right artificial knee joint: Secondary | ICD-10-CM | POA: Diagnosis not present

## 2020-02-02 DIAGNOSIS — Z8781 Personal history of (healed) traumatic fracture: Secondary | ICD-10-CM | POA: Diagnosis not present

## 2020-02-03 ENCOUNTER — Other Ambulatory Visit: Payer: Self-pay | Admitting: Internal Medicine

## 2020-02-05 DIAGNOSIS — E1122 Type 2 diabetes mellitus with diabetic chronic kidney disease: Secondary | ICD-10-CM | POA: Diagnosis not present

## 2020-02-05 DIAGNOSIS — D631 Anemia in chronic kidney disease: Secondary | ICD-10-CM | POA: Diagnosis not present

## 2020-02-05 DIAGNOSIS — N183 Chronic kidney disease, stage 3 unspecified: Secondary | ICD-10-CM | POA: Diagnosis not present

## 2020-02-05 DIAGNOSIS — I2510 Atherosclerotic heart disease of native coronary artery without angina pectoris: Secondary | ICD-10-CM | POA: Diagnosis not present

## 2020-02-05 DIAGNOSIS — H409 Unspecified glaucoma: Secondary | ICD-10-CM | POA: Diagnosis not present

## 2020-02-05 DIAGNOSIS — G43909 Migraine, unspecified, not intractable, without status migrainosus: Secondary | ICD-10-CM | POA: Diagnosis not present

## 2020-02-05 DIAGNOSIS — J4520 Mild intermittent asthma, uncomplicated: Secondary | ICD-10-CM | POA: Diagnosis not present

## 2020-02-05 DIAGNOSIS — M159 Polyosteoarthritis, unspecified: Secondary | ICD-10-CM | POA: Diagnosis not present

## 2020-02-05 DIAGNOSIS — Z993 Dependence on wheelchair: Secondary | ICD-10-CM | POA: Diagnosis not present

## 2020-02-05 DIAGNOSIS — M4802 Spinal stenosis, cervical region: Secondary | ICD-10-CM | POA: Diagnosis not present

## 2020-02-05 DIAGNOSIS — Z8781 Personal history of (healed) traumatic fracture: Secondary | ICD-10-CM | POA: Diagnosis not present

## 2020-02-05 DIAGNOSIS — Z951 Presence of aortocoronary bypass graft: Secondary | ICD-10-CM | POA: Diagnosis not present

## 2020-02-05 DIAGNOSIS — M810 Age-related osteoporosis without current pathological fracture: Secondary | ICD-10-CM | POA: Diagnosis not present

## 2020-02-05 DIAGNOSIS — Z794 Long term (current) use of insulin: Secondary | ICD-10-CM | POA: Diagnosis not present

## 2020-02-05 DIAGNOSIS — I130 Hypertensive heart and chronic kidney disease with heart failure and stage 1 through stage 4 chronic kidney disease, or unspecified chronic kidney disease: Secondary | ICD-10-CM | POA: Diagnosis not present

## 2020-02-05 DIAGNOSIS — E785 Hyperlipidemia, unspecified: Secondary | ICD-10-CM | POA: Diagnosis not present

## 2020-02-05 DIAGNOSIS — G894 Chronic pain syndrome: Secondary | ICD-10-CM | POA: Diagnosis not present

## 2020-02-05 DIAGNOSIS — I5042 Chronic combined systolic (congestive) and diastolic (congestive) heart failure: Secondary | ICD-10-CM | POA: Diagnosis not present

## 2020-02-05 DIAGNOSIS — H9191 Unspecified hearing loss, right ear: Secondary | ICD-10-CM | POA: Diagnosis not present

## 2020-02-05 DIAGNOSIS — E11319 Type 2 diabetes mellitus with unspecified diabetic retinopathy without macular edema: Secondary | ICD-10-CM | POA: Diagnosis not present

## 2020-02-05 DIAGNOSIS — Z96651 Presence of right artificial knee joint: Secondary | ICD-10-CM | POA: Diagnosis not present

## 2020-02-06 DIAGNOSIS — Z794 Long term (current) use of insulin: Secondary | ICD-10-CM | POA: Diagnosis not present

## 2020-02-06 DIAGNOSIS — Z96651 Presence of right artificial knee joint: Secondary | ICD-10-CM | POA: Diagnosis not present

## 2020-02-06 DIAGNOSIS — H9191 Unspecified hearing loss, right ear: Secondary | ICD-10-CM | POA: Diagnosis not present

## 2020-02-06 DIAGNOSIS — M4802 Spinal stenosis, cervical region: Secondary | ICD-10-CM | POA: Diagnosis not present

## 2020-02-06 DIAGNOSIS — Z951 Presence of aortocoronary bypass graft: Secondary | ICD-10-CM | POA: Diagnosis not present

## 2020-02-06 DIAGNOSIS — I2510 Atherosclerotic heart disease of native coronary artery without angina pectoris: Secondary | ICD-10-CM | POA: Diagnosis not present

## 2020-02-06 DIAGNOSIS — J4520 Mild intermittent asthma, uncomplicated: Secondary | ICD-10-CM | POA: Diagnosis not present

## 2020-02-06 DIAGNOSIS — I130 Hypertensive heart and chronic kidney disease with heart failure and stage 1 through stage 4 chronic kidney disease, or unspecified chronic kidney disease: Secondary | ICD-10-CM | POA: Diagnosis not present

## 2020-02-06 DIAGNOSIS — E785 Hyperlipidemia, unspecified: Secondary | ICD-10-CM | POA: Diagnosis not present

## 2020-02-06 DIAGNOSIS — Z993 Dependence on wheelchair: Secondary | ICD-10-CM | POA: Diagnosis not present

## 2020-02-06 DIAGNOSIS — N183 Chronic kidney disease, stage 3 unspecified: Secondary | ICD-10-CM | POA: Diagnosis not present

## 2020-02-06 DIAGNOSIS — E1122 Type 2 diabetes mellitus with diabetic chronic kidney disease: Secondary | ICD-10-CM | POA: Diagnosis not present

## 2020-02-06 DIAGNOSIS — G894 Chronic pain syndrome: Secondary | ICD-10-CM | POA: Diagnosis not present

## 2020-02-06 DIAGNOSIS — I5042 Chronic combined systolic (congestive) and diastolic (congestive) heart failure: Secondary | ICD-10-CM | POA: Diagnosis not present

## 2020-02-06 DIAGNOSIS — M159 Polyosteoarthritis, unspecified: Secondary | ICD-10-CM | POA: Diagnosis not present

## 2020-02-06 DIAGNOSIS — H409 Unspecified glaucoma: Secondary | ICD-10-CM | POA: Diagnosis not present

## 2020-02-06 DIAGNOSIS — E11319 Type 2 diabetes mellitus with unspecified diabetic retinopathy without macular edema: Secondary | ICD-10-CM | POA: Diagnosis not present

## 2020-02-06 DIAGNOSIS — Z8781 Personal history of (healed) traumatic fracture: Secondary | ICD-10-CM | POA: Diagnosis not present

## 2020-02-06 DIAGNOSIS — M810 Age-related osteoporosis without current pathological fracture: Secondary | ICD-10-CM | POA: Diagnosis not present

## 2020-02-06 DIAGNOSIS — D631 Anemia in chronic kidney disease: Secondary | ICD-10-CM | POA: Diagnosis not present

## 2020-02-06 DIAGNOSIS — G43909 Migraine, unspecified, not intractable, without status migrainosus: Secondary | ICD-10-CM | POA: Diagnosis not present

## 2020-02-08 DIAGNOSIS — H9191 Unspecified hearing loss, right ear: Secondary | ICD-10-CM | POA: Diagnosis not present

## 2020-02-08 DIAGNOSIS — Z8781 Personal history of (healed) traumatic fracture: Secondary | ICD-10-CM | POA: Diagnosis not present

## 2020-02-08 DIAGNOSIS — I2510 Atherosclerotic heart disease of native coronary artery without angina pectoris: Secondary | ICD-10-CM | POA: Diagnosis not present

## 2020-02-08 DIAGNOSIS — E11319 Type 2 diabetes mellitus with unspecified diabetic retinopathy without macular edema: Secondary | ICD-10-CM | POA: Diagnosis not present

## 2020-02-08 DIAGNOSIS — M810 Age-related osteoporosis without current pathological fracture: Secondary | ICD-10-CM | POA: Diagnosis not present

## 2020-02-08 DIAGNOSIS — Z951 Presence of aortocoronary bypass graft: Secondary | ICD-10-CM | POA: Diagnosis not present

## 2020-02-08 DIAGNOSIS — G894 Chronic pain syndrome: Secondary | ICD-10-CM | POA: Diagnosis not present

## 2020-02-08 DIAGNOSIS — I130 Hypertensive heart and chronic kidney disease with heart failure and stage 1 through stage 4 chronic kidney disease, or unspecified chronic kidney disease: Secondary | ICD-10-CM | POA: Diagnosis not present

## 2020-02-08 DIAGNOSIS — M159 Polyosteoarthritis, unspecified: Secondary | ICD-10-CM | POA: Diagnosis not present

## 2020-02-08 DIAGNOSIS — G43909 Migraine, unspecified, not intractable, without status migrainosus: Secondary | ICD-10-CM | POA: Diagnosis not present

## 2020-02-08 DIAGNOSIS — N183 Chronic kidney disease, stage 3 unspecified: Secondary | ICD-10-CM | POA: Diagnosis not present

## 2020-02-08 DIAGNOSIS — E1122 Type 2 diabetes mellitus with diabetic chronic kidney disease: Secondary | ICD-10-CM | POA: Diagnosis not present

## 2020-02-08 DIAGNOSIS — Z96651 Presence of right artificial knee joint: Secondary | ICD-10-CM | POA: Diagnosis not present

## 2020-02-08 DIAGNOSIS — J4520 Mild intermittent asthma, uncomplicated: Secondary | ICD-10-CM | POA: Diagnosis not present

## 2020-02-08 DIAGNOSIS — H409 Unspecified glaucoma: Secondary | ICD-10-CM | POA: Diagnosis not present

## 2020-02-08 DIAGNOSIS — M4802 Spinal stenosis, cervical region: Secondary | ICD-10-CM | POA: Diagnosis not present

## 2020-02-08 DIAGNOSIS — D631 Anemia in chronic kidney disease: Secondary | ICD-10-CM | POA: Diagnosis not present

## 2020-02-08 DIAGNOSIS — Z794 Long term (current) use of insulin: Secondary | ICD-10-CM | POA: Diagnosis not present

## 2020-02-08 DIAGNOSIS — I5042 Chronic combined systolic (congestive) and diastolic (congestive) heart failure: Secondary | ICD-10-CM | POA: Diagnosis not present

## 2020-02-08 DIAGNOSIS — Z993 Dependence on wheelchair: Secondary | ICD-10-CM | POA: Diagnosis not present

## 2020-02-08 DIAGNOSIS — E785 Hyperlipidemia, unspecified: Secondary | ICD-10-CM | POA: Diagnosis not present

## 2020-02-10 DIAGNOSIS — I509 Heart failure, unspecified: Secondary | ICD-10-CM | POA: Diagnosis not present

## 2020-02-10 DIAGNOSIS — J4520 Mild intermittent asthma, uncomplicated: Secondary | ICD-10-CM | POA: Diagnosis not present

## 2020-02-13 DIAGNOSIS — G43909 Migraine, unspecified, not intractable, without status migrainosus: Secondary | ICD-10-CM | POA: Diagnosis not present

## 2020-02-13 DIAGNOSIS — G894 Chronic pain syndrome: Secondary | ICD-10-CM | POA: Diagnosis not present

## 2020-02-13 DIAGNOSIS — Z993 Dependence on wheelchair: Secondary | ICD-10-CM | POA: Diagnosis not present

## 2020-02-13 DIAGNOSIS — E1122 Type 2 diabetes mellitus with diabetic chronic kidney disease: Secondary | ICD-10-CM | POA: Diagnosis not present

## 2020-02-13 DIAGNOSIS — Z8781 Personal history of (healed) traumatic fracture: Secondary | ICD-10-CM | POA: Diagnosis not present

## 2020-02-13 DIAGNOSIS — Z96651 Presence of right artificial knee joint: Secondary | ICD-10-CM | POA: Diagnosis not present

## 2020-02-13 DIAGNOSIS — I2510 Atherosclerotic heart disease of native coronary artery without angina pectoris: Secondary | ICD-10-CM | POA: Diagnosis not present

## 2020-02-13 DIAGNOSIS — E785 Hyperlipidemia, unspecified: Secondary | ICD-10-CM | POA: Diagnosis not present

## 2020-02-13 DIAGNOSIS — I5042 Chronic combined systolic (congestive) and diastolic (congestive) heart failure: Secondary | ICD-10-CM | POA: Diagnosis not present

## 2020-02-13 DIAGNOSIS — M4802 Spinal stenosis, cervical region: Secondary | ICD-10-CM | POA: Diagnosis not present

## 2020-02-13 DIAGNOSIS — J4520 Mild intermittent asthma, uncomplicated: Secondary | ICD-10-CM | POA: Diagnosis not present

## 2020-02-13 DIAGNOSIS — H9191 Unspecified hearing loss, right ear: Secondary | ICD-10-CM | POA: Diagnosis not present

## 2020-02-13 DIAGNOSIS — M159 Polyosteoarthritis, unspecified: Secondary | ICD-10-CM | POA: Diagnosis not present

## 2020-02-13 DIAGNOSIS — I130 Hypertensive heart and chronic kidney disease with heart failure and stage 1 through stage 4 chronic kidney disease, or unspecified chronic kidney disease: Secondary | ICD-10-CM | POA: Diagnosis not present

## 2020-02-13 DIAGNOSIS — E11319 Type 2 diabetes mellitus with unspecified diabetic retinopathy without macular edema: Secondary | ICD-10-CM | POA: Diagnosis not present

## 2020-02-13 DIAGNOSIS — Z951 Presence of aortocoronary bypass graft: Secondary | ICD-10-CM | POA: Diagnosis not present

## 2020-02-13 DIAGNOSIS — D631 Anemia in chronic kidney disease: Secondary | ICD-10-CM | POA: Diagnosis not present

## 2020-02-13 DIAGNOSIS — N183 Chronic kidney disease, stage 3 unspecified: Secondary | ICD-10-CM | POA: Diagnosis not present

## 2020-02-13 DIAGNOSIS — M810 Age-related osteoporosis without current pathological fracture: Secondary | ICD-10-CM | POA: Diagnosis not present

## 2020-02-13 DIAGNOSIS — H409 Unspecified glaucoma: Secondary | ICD-10-CM | POA: Diagnosis not present

## 2020-02-13 DIAGNOSIS — Z794 Long term (current) use of insulin: Secondary | ICD-10-CM | POA: Diagnosis not present

## 2020-02-14 ENCOUNTER — Telehealth: Payer: Self-pay

## 2020-02-14 DIAGNOSIS — I2510 Atherosclerotic heart disease of native coronary artery without angina pectoris: Secondary | ICD-10-CM | POA: Diagnosis not present

## 2020-02-14 DIAGNOSIS — I5042 Chronic combined systolic (congestive) and diastolic (congestive) heart failure: Secondary | ICD-10-CM | POA: Diagnosis not present

## 2020-02-14 DIAGNOSIS — M159 Polyosteoarthritis, unspecified: Secondary | ICD-10-CM | POA: Diagnosis not present

## 2020-02-14 DIAGNOSIS — Z8781 Personal history of (healed) traumatic fracture: Secondary | ICD-10-CM | POA: Diagnosis not present

## 2020-02-14 DIAGNOSIS — Z993 Dependence on wheelchair: Secondary | ICD-10-CM | POA: Diagnosis not present

## 2020-02-14 DIAGNOSIS — I130 Hypertensive heart and chronic kidney disease with heart failure and stage 1 through stage 4 chronic kidney disease, or unspecified chronic kidney disease: Secondary | ICD-10-CM | POA: Diagnosis not present

## 2020-02-14 DIAGNOSIS — N183 Chronic kidney disease, stage 3 unspecified: Secondary | ICD-10-CM | POA: Diagnosis not present

## 2020-02-14 DIAGNOSIS — M4802 Spinal stenosis, cervical region: Secondary | ICD-10-CM | POA: Diagnosis not present

## 2020-02-14 DIAGNOSIS — E785 Hyperlipidemia, unspecified: Secondary | ICD-10-CM | POA: Diagnosis not present

## 2020-02-14 DIAGNOSIS — Z951 Presence of aortocoronary bypass graft: Secondary | ICD-10-CM | POA: Diagnosis not present

## 2020-02-14 DIAGNOSIS — E11319 Type 2 diabetes mellitus with unspecified diabetic retinopathy without macular edema: Secondary | ICD-10-CM | POA: Diagnosis not present

## 2020-02-14 DIAGNOSIS — M24542 Contracture, left hand: Secondary | ICD-10-CM | POA: Diagnosis not present

## 2020-02-14 DIAGNOSIS — H409 Unspecified glaucoma: Secondary | ICD-10-CM | POA: Diagnosis not present

## 2020-02-14 DIAGNOSIS — Z794 Long term (current) use of insulin: Secondary | ICD-10-CM | POA: Diagnosis not present

## 2020-02-14 DIAGNOSIS — G43909 Migraine, unspecified, not intractable, without status migrainosus: Secondary | ICD-10-CM | POA: Diagnosis not present

## 2020-02-14 DIAGNOSIS — J4520 Mild intermittent asthma, uncomplicated: Secondary | ICD-10-CM | POA: Diagnosis not present

## 2020-02-14 DIAGNOSIS — R269 Unspecified abnormalities of gait and mobility: Secondary | ICD-10-CM

## 2020-02-14 DIAGNOSIS — Z96651 Presence of right artificial knee joint: Secondary | ICD-10-CM | POA: Diagnosis not present

## 2020-02-14 DIAGNOSIS — G894 Chronic pain syndrome: Secondary | ICD-10-CM | POA: Diagnosis not present

## 2020-02-14 DIAGNOSIS — H9191 Unspecified hearing loss, right ear: Secondary | ICD-10-CM | POA: Diagnosis not present

## 2020-02-14 DIAGNOSIS — E1122 Type 2 diabetes mellitus with diabetic chronic kidney disease: Secondary | ICD-10-CM | POA: Diagnosis not present

## 2020-02-14 DIAGNOSIS — M810 Age-related osteoporosis without current pathological fracture: Secondary | ICD-10-CM | POA: Diagnosis not present

## 2020-02-14 DIAGNOSIS — D631 Anemia in chronic kidney disease: Secondary | ICD-10-CM | POA: Diagnosis not present

## 2020-02-14 NOTE — Telephone Encounter (Signed)
Order request sent to Dr Jenny Reichmann.

## 2020-02-14 NOTE — Telephone Encounter (Signed)
New message    Request verbal order for home health aide to assisting patient with bathing.   Starting one week x one  then two week x two   then one week x one

## 2020-02-14 NOTE — Telephone Encounter (Signed)
Dianna with [facility] called & per Dr Jenny Reichmann, verbal order for home health aide to assisting patient with bathing.   Starting one week x one  then two week x two   then one week x one

## 2020-02-14 NOTE — Telephone Encounter (Signed)
Ok for verbal 

## 2020-02-15 ENCOUNTER — Other Ambulatory Visit: Payer: Self-pay | Admitting: Internal Medicine

## 2020-02-15 DIAGNOSIS — Z96651 Presence of right artificial knee joint: Secondary | ICD-10-CM | POA: Diagnosis not present

## 2020-02-15 DIAGNOSIS — Z993 Dependence on wheelchair: Secondary | ICD-10-CM | POA: Diagnosis not present

## 2020-02-15 DIAGNOSIS — I5042 Chronic combined systolic (congestive) and diastolic (congestive) heart failure: Secondary | ICD-10-CM | POA: Diagnosis not present

## 2020-02-15 DIAGNOSIS — I130 Hypertensive heart and chronic kidney disease with heart failure and stage 1 through stage 4 chronic kidney disease, or unspecified chronic kidney disease: Secondary | ICD-10-CM | POA: Diagnosis not present

## 2020-02-15 DIAGNOSIS — D631 Anemia in chronic kidney disease: Secondary | ICD-10-CM | POA: Diagnosis not present

## 2020-02-15 DIAGNOSIS — N183 Chronic kidney disease, stage 3 unspecified: Secondary | ICD-10-CM | POA: Diagnosis not present

## 2020-02-15 DIAGNOSIS — M4802 Spinal stenosis, cervical region: Secondary | ICD-10-CM | POA: Diagnosis not present

## 2020-02-15 DIAGNOSIS — M810 Age-related osteoporosis without current pathological fracture: Secondary | ICD-10-CM | POA: Diagnosis not present

## 2020-02-15 DIAGNOSIS — E1122 Type 2 diabetes mellitus with diabetic chronic kidney disease: Secondary | ICD-10-CM | POA: Diagnosis not present

## 2020-02-15 DIAGNOSIS — Z794 Long term (current) use of insulin: Secondary | ICD-10-CM | POA: Diagnosis not present

## 2020-02-15 DIAGNOSIS — H9191 Unspecified hearing loss, right ear: Secondary | ICD-10-CM | POA: Diagnosis not present

## 2020-02-15 DIAGNOSIS — M159 Polyosteoarthritis, unspecified: Secondary | ICD-10-CM | POA: Diagnosis not present

## 2020-02-15 DIAGNOSIS — H409 Unspecified glaucoma: Secondary | ICD-10-CM | POA: Diagnosis not present

## 2020-02-15 DIAGNOSIS — I2510 Atherosclerotic heart disease of native coronary artery without angina pectoris: Secondary | ICD-10-CM | POA: Diagnosis not present

## 2020-02-15 DIAGNOSIS — G894 Chronic pain syndrome: Secondary | ICD-10-CM | POA: Diagnosis not present

## 2020-02-15 DIAGNOSIS — G43909 Migraine, unspecified, not intractable, without status migrainosus: Secondary | ICD-10-CM | POA: Diagnosis not present

## 2020-02-15 DIAGNOSIS — Z951 Presence of aortocoronary bypass graft: Secondary | ICD-10-CM | POA: Diagnosis not present

## 2020-02-15 DIAGNOSIS — J4520 Mild intermittent asthma, uncomplicated: Secondary | ICD-10-CM | POA: Diagnosis not present

## 2020-02-15 DIAGNOSIS — Z8781 Personal history of (healed) traumatic fracture: Secondary | ICD-10-CM | POA: Diagnosis not present

## 2020-02-15 DIAGNOSIS — E785 Hyperlipidemia, unspecified: Secondary | ICD-10-CM | POA: Diagnosis not present

## 2020-02-15 DIAGNOSIS — E11319 Type 2 diabetes mellitus with unspecified diabetic retinopathy without macular edema: Secondary | ICD-10-CM | POA: Diagnosis not present

## 2020-02-15 NOTE — Telephone Encounter (Signed)
   Call  From Marsha([phone]) at [facility] requesting order for drop arm bedside comode, fax to [facility] [phone]

## 2020-02-15 NOTE — Addendum Note (Signed)
Addended by: Biagio Borg on: 02/15/2020 03:36 PM   Modules accepted: Orders

## 2020-02-15 NOTE — Telephone Encounter (Signed)
Tried to fax and fax confirmation page states fax was busy. Will try again tomorrow.

## 2020-02-15 NOTE — Telephone Encounter (Signed)
Done hardcopy 

## 2020-02-15 NOTE — Telephone Encounter (Signed)
Done hardcopy to maria 

## 2020-02-16 NOTE — Telephone Encounter (Signed)
   Fax number updated  Please fax to [phone]

## 2020-02-20 DIAGNOSIS — G894 Chronic pain syndrome: Secondary | ICD-10-CM | POA: Diagnosis not present

## 2020-02-20 DIAGNOSIS — Z993 Dependence on wheelchair: Secondary | ICD-10-CM | POA: Diagnosis not present

## 2020-02-20 DIAGNOSIS — D631 Anemia in chronic kidney disease: Secondary | ICD-10-CM | POA: Diagnosis not present

## 2020-02-20 DIAGNOSIS — M159 Polyosteoarthritis, unspecified: Secondary | ICD-10-CM | POA: Diagnosis not present

## 2020-02-20 DIAGNOSIS — M4802 Spinal stenosis, cervical region: Secondary | ICD-10-CM | POA: Diagnosis not present

## 2020-02-20 DIAGNOSIS — E11319 Type 2 diabetes mellitus with unspecified diabetic retinopathy without macular edema: Secondary | ICD-10-CM | POA: Diagnosis not present

## 2020-02-20 DIAGNOSIS — Z794 Long term (current) use of insulin: Secondary | ICD-10-CM | POA: Diagnosis not present

## 2020-02-20 DIAGNOSIS — Z951 Presence of aortocoronary bypass graft: Secondary | ICD-10-CM | POA: Diagnosis not present

## 2020-02-20 DIAGNOSIS — J4520 Mild intermittent asthma, uncomplicated: Secondary | ICD-10-CM | POA: Diagnosis not present

## 2020-02-20 DIAGNOSIS — I2510 Atherosclerotic heart disease of native coronary artery without angina pectoris: Secondary | ICD-10-CM | POA: Diagnosis not present

## 2020-02-20 DIAGNOSIS — E1122 Type 2 diabetes mellitus with diabetic chronic kidney disease: Secondary | ICD-10-CM | POA: Diagnosis not present

## 2020-02-20 DIAGNOSIS — E785 Hyperlipidemia, unspecified: Secondary | ICD-10-CM | POA: Diagnosis not present

## 2020-02-20 DIAGNOSIS — G43909 Migraine, unspecified, not intractable, without status migrainosus: Secondary | ICD-10-CM | POA: Diagnosis not present

## 2020-02-20 DIAGNOSIS — Z96651 Presence of right artificial knee joint: Secondary | ICD-10-CM | POA: Diagnosis not present

## 2020-02-20 DIAGNOSIS — I5042 Chronic combined systolic (congestive) and diastolic (congestive) heart failure: Secondary | ICD-10-CM | POA: Diagnosis not present

## 2020-02-20 DIAGNOSIS — N183 Chronic kidney disease, stage 3 unspecified: Secondary | ICD-10-CM | POA: Diagnosis not present

## 2020-02-20 DIAGNOSIS — H409 Unspecified glaucoma: Secondary | ICD-10-CM | POA: Diagnosis not present

## 2020-02-20 DIAGNOSIS — I130 Hypertensive heart and chronic kidney disease with heart failure and stage 1 through stage 4 chronic kidney disease, or unspecified chronic kidney disease: Secondary | ICD-10-CM | POA: Diagnosis not present

## 2020-02-20 DIAGNOSIS — Z8781 Personal history of (healed) traumatic fracture: Secondary | ICD-10-CM | POA: Diagnosis not present

## 2020-02-20 DIAGNOSIS — M810 Age-related osteoporosis without current pathological fracture: Secondary | ICD-10-CM | POA: Diagnosis not present

## 2020-02-20 DIAGNOSIS — H9191 Unspecified hearing loss, right ear: Secondary | ICD-10-CM | POA: Diagnosis not present

## 2020-02-21 DIAGNOSIS — Z96651 Presence of right artificial knee joint: Secondary | ICD-10-CM | POA: Diagnosis not present

## 2020-02-21 DIAGNOSIS — G43909 Migraine, unspecified, not intractable, without status migrainosus: Secondary | ICD-10-CM | POA: Diagnosis not present

## 2020-02-21 DIAGNOSIS — E1122 Type 2 diabetes mellitus with diabetic chronic kidney disease: Secondary | ICD-10-CM | POA: Diagnosis not present

## 2020-02-21 DIAGNOSIS — N183 Chronic kidney disease, stage 3 unspecified: Secondary | ICD-10-CM | POA: Diagnosis not present

## 2020-02-21 DIAGNOSIS — G894 Chronic pain syndrome: Secondary | ICD-10-CM | POA: Diagnosis not present

## 2020-02-21 DIAGNOSIS — J4520 Mild intermittent asthma, uncomplicated: Secondary | ICD-10-CM | POA: Diagnosis not present

## 2020-02-21 DIAGNOSIS — H9191 Unspecified hearing loss, right ear: Secondary | ICD-10-CM | POA: Diagnosis not present

## 2020-02-21 DIAGNOSIS — E11319 Type 2 diabetes mellitus with unspecified diabetic retinopathy without macular edema: Secondary | ICD-10-CM | POA: Diagnosis not present

## 2020-02-21 DIAGNOSIS — H409 Unspecified glaucoma: Secondary | ICD-10-CM | POA: Diagnosis not present

## 2020-02-21 DIAGNOSIS — Z951 Presence of aortocoronary bypass graft: Secondary | ICD-10-CM | POA: Diagnosis not present

## 2020-02-21 DIAGNOSIS — M810 Age-related osteoporosis without current pathological fracture: Secondary | ICD-10-CM | POA: Diagnosis not present

## 2020-02-21 DIAGNOSIS — I130 Hypertensive heart and chronic kidney disease with heart failure and stage 1 through stage 4 chronic kidney disease, or unspecified chronic kidney disease: Secondary | ICD-10-CM | POA: Diagnosis not present

## 2020-02-21 DIAGNOSIS — Z794 Long term (current) use of insulin: Secondary | ICD-10-CM | POA: Diagnosis not present

## 2020-02-21 DIAGNOSIS — I5042 Chronic combined systolic (congestive) and diastolic (congestive) heart failure: Secondary | ICD-10-CM | POA: Diagnosis not present

## 2020-02-21 DIAGNOSIS — E785 Hyperlipidemia, unspecified: Secondary | ICD-10-CM | POA: Diagnosis not present

## 2020-02-21 DIAGNOSIS — Z993 Dependence on wheelchair: Secondary | ICD-10-CM | POA: Diagnosis not present

## 2020-02-21 DIAGNOSIS — Z8781 Personal history of (healed) traumatic fracture: Secondary | ICD-10-CM | POA: Diagnosis not present

## 2020-02-21 DIAGNOSIS — M159 Polyosteoarthritis, unspecified: Secondary | ICD-10-CM | POA: Diagnosis not present

## 2020-02-21 DIAGNOSIS — D631 Anemia in chronic kidney disease: Secondary | ICD-10-CM | POA: Diagnosis not present

## 2020-02-21 DIAGNOSIS — M4802 Spinal stenosis, cervical region: Secondary | ICD-10-CM | POA: Diagnosis not present

## 2020-02-21 DIAGNOSIS — I2510 Atherosclerotic heart disease of native coronary artery without angina pectoris: Secondary | ICD-10-CM | POA: Diagnosis not present

## 2020-02-22 ENCOUNTER — Other Ambulatory Visit: Payer: Self-pay | Admitting: Internal Medicine

## 2020-02-23 ENCOUNTER — Encounter: Payer: Self-pay | Admitting: Internal Medicine

## 2020-02-23 DIAGNOSIS — E11319 Type 2 diabetes mellitus with unspecified diabetic retinopathy without macular edema: Secondary | ICD-10-CM | POA: Diagnosis not present

## 2020-02-23 DIAGNOSIS — E1122 Type 2 diabetes mellitus with diabetic chronic kidney disease: Secondary | ICD-10-CM | POA: Diagnosis not present

## 2020-02-23 DIAGNOSIS — G43909 Migraine, unspecified, not intractable, without status migrainosus: Secondary | ICD-10-CM | POA: Diagnosis not present

## 2020-02-23 DIAGNOSIS — G894 Chronic pain syndrome: Secondary | ICD-10-CM | POA: Diagnosis not present

## 2020-02-23 DIAGNOSIS — N183 Chronic kidney disease, stage 3 unspecified: Secondary | ICD-10-CM | POA: Diagnosis not present

## 2020-02-23 DIAGNOSIS — Z8781 Personal history of (healed) traumatic fracture: Secondary | ICD-10-CM | POA: Diagnosis not present

## 2020-02-23 DIAGNOSIS — Z993 Dependence on wheelchair: Secondary | ICD-10-CM | POA: Diagnosis not present

## 2020-02-23 DIAGNOSIS — I5042 Chronic combined systolic (congestive) and diastolic (congestive) heart failure: Secondary | ICD-10-CM | POA: Diagnosis not present

## 2020-02-23 DIAGNOSIS — I130 Hypertensive heart and chronic kidney disease with heart failure and stage 1 through stage 4 chronic kidney disease, or unspecified chronic kidney disease: Secondary | ICD-10-CM | POA: Diagnosis not present

## 2020-02-23 DIAGNOSIS — J4520 Mild intermittent asthma, uncomplicated: Secondary | ICD-10-CM | POA: Diagnosis not present

## 2020-02-23 DIAGNOSIS — Z96651 Presence of right artificial knee joint: Secondary | ICD-10-CM | POA: Diagnosis not present

## 2020-02-23 DIAGNOSIS — E785 Hyperlipidemia, unspecified: Secondary | ICD-10-CM | POA: Diagnosis not present

## 2020-02-23 DIAGNOSIS — M4802 Spinal stenosis, cervical region: Secondary | ICD-10-CM | POA: Diagnosis not present

## 2020-02-23 DIAGNOSIS — H409 Unspecified glaucoma: Secondary | ICD-10-CM | POA: Diagnosis not present

## 2020-02-23 DIAGNOSIS — H9191 Unspecified hearing loss, right ear: Secondary | ICD-10-CM | POA: Diagnosis not present

## 2020-02-23 DIAGNOSIS — M810 Age-related osteoporosis without current pathological fracture: Secondary | ICD-10-CM | POA: Diagnosis not present

## 2020-02-23 DIAGNOSIS — I2510 Atherosclerotic heart disease of native coronary artery without angina pectoris: Secondary | ICD-10-CM | POA: Diagnosis not present

## 2020-02-23 DIAGNOSIS — Z951 Presence of aortocoronary bypass graft: Secondary | ICD-10-CM | POA: Diagnosis not present

## 2020-02-23 DIAGNOSIS — Z794 Long term (current) use of insulin: Secondary | ICD-10-CM | POA: Diagnosis not present

## 2020-02-23 DIAGNOSIS — D631 Anemia in chronic kidney disease: Secondary | ICD-10-CM | POA: Diagnosis not present

## 2020-02-23 DIAGNOSIS — M159 Polyosteoarthritis, unspecified: Secondary | ICD-10-CM | POA: Diagnosis not present

## 2020-02-25 ENCOUNTER — Encounter: Payer: Self-pay | Admitting: Internal Medicine

## 2020-02-25 DIAGNOSIS — Z09 Encounter for follow-up examination after completed treatment for conditions other than malignant neoplasm: Secondary | ICD-10-CM

## 2020-02-25 DIAGNOSIS — F0391 Unspecified dementia with behavioral disturbance: Secondary | ICD-10-CM

## 2020-02-26 DIAGNOSIS — H409 Unspecified glaucoma: Secondary | ICD-10-CM | POA: Diagnosis not present

## 2020-02-26 DIAGNOSIS — Z8781 Personal history of (healed) traumatic fracture: Secondary | ICD-10-CM | POA: Diagnosis not present

## 2020-02-26 DIAGNOSIS — D631 Anemia in chronic kidney disease: Secondary | ICD-10-CM | POA: Diagnosis not present

## 2020-02-26 DIAGNOSIS — G43909 Migraine, unspecified, not intractable, without status migrainosus: Secondary | ICD-10-CM | POA: Diagnosis not present

## 2020-02-26 DIAGNOSIS — Z951 Presence of aortocoronary bypass graft: Secondary | ICD-10-CM | POA: Diagnosis not present

## 2020-02-26 DIAGNOSIS — I130 Hypertensive heart and chronic kidney disease with heart failure and stage 1 through stage 4 chronic kidney disease, or unspecified chronic kidney disease: Secondary | ICD-10-CM | POA: Diagnosis not present

## 2020-02-26 DIAGNOSIS — E1122 Type 2 diabetes mellitus with diabetic chronic kidney disease: Secondary | ICD-10-CM | POA: Diagnosis not present

## 2020-02-26 DIAGNOSIS — J4520 Mild intermittent asthma, uncomplicated: Secondary | ICD-10-CM | POA: Diagnosis not present

## 2020-02-26 DIAGNOSIS — I2510 Atherosclerotic heart disease of native coronary artery without angina pectoris: Secondary | ICD-10-CM | POA: Diagnosis not present

## 2020-02-26 DIAGNOSIS — Z993 Dependence on wheelchair: Secondary | ICD-10-CM | POA: Diagnosis not present

## 2020-02-26 DIAGNOSIS — M810 Age-related osteoporosis without current pathological fracture: Secondary | ICD-10-CM | POA: Diagnosis not present

## 2020-02-26 DIAGNOSIS — E11319 Type 2 diabetes mellitus with unspecified diabetic retinopathy without macular edema: Secondary | ICD-10-CM | POA: Diagnosis not present

## 2020-02-26 DIAGNOSIS — M159 Polyosteoarthritis, unspecified: Secondary | ICD-10-CM | POA: Diagnosis not present

## 2020-02-26 DIAGNOSIS — N183 Chronic kidney disease, stage 3 unspecified: Secondary | ICD-10-CM | POA: Diagnosis not present

## 2020-02-26 DIAGNOSIS — E785 Hyperlipidemia, unspecified: Secondary | ICD-10-CM | POA: Diagnosis not present

## 2020-02-26 DIAGNOSIS — Z794 Long term (current) use of insulin: Secondary | ICD-10-CM | POA: Diagnosis not present

## 2020-02-26 DIAGNOSIS — G894 Chronic pain syndrome: Secondary | ICD-10-CM | POA: Diagnosis not present

## 2020-02-26 DIAGNOSIS — M4802 Spinal stenosis, cervical region: Secondary | ICD-10-CM | POA: Diagnosis not present

## 2020-02-26 DIAGNOSIS — I5042 Chronic combined systolic (congestive) and diastolic (congestive) heart failure: Secondary | ICD-10-CM | POA: Diagnosis not present

## 2020-02-26 DIAGNOSIS — Z96651 Presence of right artificial knee joint: Secondary | ICD-10-CM | POA: Diagnosis not present

## 2020-02-26 DIAGNOSIS — H9191 Unspecified hearing loss, right ear: Secondary | ICD-10-CM | POA: Diagnosis not present

## 2020-02-27 DIAGNOSIS — D631 Anemia in chronic kidney disease: Secondary | ICD-10-CM | POA: Diagnosis not present

## 2020-02-27 DIAGNOSIS — E1122 Type 2 diabetes mellitus with diabetic chronic kidney disease: Secondary | ICD-10-CM | POA: Diagnosis not present

## 2020-02-27 DIAGNOSIS — G43909 Migraine, unspecified, not intractable, without status migrainosus: Secondary | ICD-10-CM | POA: Diagnosis not present

## 2020-02-27 DIAGNOSIS — E785 Hyperlipidemia, unspecified: Secondary | ICD-10-CM | POA: Diagnosis not present

## 2020-02-27 DIAGNOSIS — I5042 Chronic combined systolic (congestive) and diastolic (congestive) heart failure: Secondary | ICD-10-CM | POA: Diagnosis not present

## 2020-02-27 DIAGNOSIS — Z794 Long term (current) use of insulin: Secondary | ICD-10-CM | POA: Diagnosis not present

## 2020-02-27 DIAGNOSIS — G894 Chronic pain syndrome: Secondary | ICD-10-CM | POA: Diagnosis not present

## 2020-02-27 DIAGNOSIS — Z993 Dependence on wheelchair: Secondary | ICD-10-CM | POA: Diagnosis not present

## 2020-02-27 DIAGNOSIS — M810 Age-related osteoporosis without current pathological fracture: Secondary | ICD-10-CM | POA: Diagnosis not present

## 2020-02-27 DIAGNOSIS — J4520 Mild intermittent asthma, uncomplicated: Secondary | ICD-10-CM | POA: Diagnosis not present

## 2020-02-27 DIAGNOSIS — H409 Unspecified glaucoma: Secondary | ICD-10-CM | POA: Diagnosis not present

## 2020-02-27 DIAGNOSIS — M159 Polyosteoarthritis, unspecified: Secondary | ICD-10-CM | POA: Diagnosis not present

## 2020-02-27 DIAGNOSIS — H9191 Unspecified hearing loss, right ear: Secondary | ICD-10-CM | POA: Diagnosis not present

## 2020-02-27 DIAGNOSIS — Z8781 Personal history of (healed) traumatic fracture: Secondary | ICD-10-CM | POA: Diagnosis not present

## 2020-02-27 DIAGNOSIS — M4802 Spinal stenosis, cervical region: Secondary | ICD-10-CM | POA: Diagnosis not present

## 2020-02-27 DIAGNOSIS — Z951 Presence of aortocoronary bypass graft: Secondary | ICD-10-CM | POA: Diagnosis not present

## 2020-02-27 DIAGNOSIS — E11319 Type 2 diabetes mellitus with unspecified diabetic retinopathy without macular edema: Secondary | ICD-10-CM | POA: Diagnosis not present

## 2020-02-27 DIAGNOSIS — N183 Chronic kidney disease, stage 3 unspecified: Secondary | ICD-10-CM | POA: Diagnosis not present

## 2020-02-27 DIAGNOSIS — I130 Hypertensive heart and chronic kidney disease with heart failure and stage 1 through stage 4 chronic kidney disease, or unspecified chronic kidney disease: Secondary | ICD-10-CM | POA: Diagnosis not present

## 2020-02-27 DIAGNOSIS — Z96651 Presence of right artificial knee joint: Secondary | ICD-10-CM | POA: Diagnosis not present

## 2020-02-27 DIAGNOSIS — I2510 Atherosclerotic heart disease of native coronary artery without angina pectoris: Secondary | ICD-10-CM | POA: Diagnosis not present

## 2020-02-28 ENCOUNTER — Other Ambulatory Visit: Payer: Self-pay | Admitting: Internal Medicine

## 2020-02-28 NOTE — Telephone Encounter (Signed)
Please refill as per office routine med refill policy (all routine meds refilled for 3 mo or monthly per pt preference up to one year from last visit, then month to month grace period for 3 mo, then further med refills will have to be denied)  

## 2020-03-01 MED ORDER — TRAMADOL HCL 50 MG PO TABS: 50.0000 mg | ORAL_TABLET | Freq: Four times a day (QID) | ORAL | 0 refills | Status: DC | PRN

## 2020-03-06 ENCOUNTER — Telehealth: Payer: Self-pay

## 2020-03-06 DIAGNOSIS — R410 Disorientation, unspecified: Secondary | ICD-10-CM | POA: Diagnosis not present

## 2020-03-06 DIAGNOSIS — I110 Hypertensive heart disease with heart failure: Secondary | ICD-10-CM | POA: Diagnosis not present

## 2020-03-06 DIAGNOSIS — I10 Essential (primary) hypertension: Secondary | ICD-10-CM | POA: Diagnosis not present

## 2020-03-06 DIAGNOSIS — K219 Gastro-esophageal reflux disease without esophagitis: Secondary | ICD-10-CM | POA: Diagnosis not present

## 2020-03-06 DIAGNOSIS — E11649 Type 2 diabetes mellitus with hypoglycemia without coma: Secondary | ICD-10-CM | POA: Diagnosis not present

## 2020-03-06 DIAGNOSIS — Z79899 Other long term (current) drug therapy: Secondary | ICD-10-CM | POA: Diagnosis not present

## 2020-03-06 DIAGNOSIS — B379 Candidiasis, unspecified: Secondary | ICD-10-CM | POA: Diagnosis not present

## 2020-03-06 DIAGNOSIS — R55 Syncope and collapse: Secondary | ICD-10-CM | POA: Diagnosis not present

## 2020-03-06 DIAGNOSIS — E11319 Type 2 diabetes mellitus with unspecified diabetic retinopathy without macular edema: Secondary | ICD-10-CM | POA: Diagnosis not present

## 2020-03-06 DIAGNOSIS — M25571 Pain in right ankle and joints of right foot: Secondary | ICD-10-CM | POA: Diagnosis not present

## 2020-03-06 DIAGNOSIS — M4802 Spinal stenosis, cervical region: Secondary | ICD-10-CM | POA: Diagnosis not present

## 2020-03-06 DIAGNOSIS — Z043 Encounter for examination and observation following other accident: Secondary | ICD-10-CM | POA: Diagnosis not present

## 2020-03-06 DIAGNOSIS — W19XXXA Unspecified fall, initial encounter: Secondary | ICD-10-CM | POA: Diagnosis not present

## 2020-03-06 DIAGNOSIS — R2243 Localized swelling, mass and lump, lower limb, bilateral: Secondary | ICD-10-CM | POA: Diagnosis not present

## 2020-03-06 DIAGNOSIS — M160 Bilateral primary osteoarthritis of hip: Secondary | ICD-10-CM | POA: Diagnosis not present

## 2020-03-06 DIAGNOSIS — R10819 Abdominal tenderness, unspecified site: Secondary | ICD-10-CM | POA: Diagnosis not present

## 2020-03-06 DIAGNOSIS — M25462 Effusion, left knee: Secondary | ICD-10-CM | POA: Diagnosis not present

## 2020-03-06 DIAGNOSIS — M1712 Unilateral primary osteoarthritis, left knee: Secondary | ICD-10-CM | POA: Diagnosis not present

## 2020-03-06 DIAGNOSIS — E785 Hyperlipidemia, unspecified: Secondary | ICD-10-CM | POA: Diagnosis not present

## 2020-03-06 DIAGNOSIS — M4316 Spondylolisthesis, lumbar region: Secondary | ICD-10-CM | POA: Diagnosis not present

## 2020-03-06 DIAGNOSIS — I5030 Unspecified diastolic (congestive) heart failure: Secondary | ICD-10-CM | POA: Diagnosis not present

## 2020-03-06 DIAGNOSIS — R519 Headache, unspecified: Secondary | ICD-10-CM | POA: Diagnosis not present

## 2020-03-06 DIAGNOSIS — R9431 Abnormal electrocardiogram [ECG] [EKG]: Secondary | ICD-10-CM | POA: Diagnosis not present

## 2020-03-06 DIAGNOSIS — M48061 Spinal stenosis, lumbar region without neurogenic claudication: Secondary | ICD-10-CM | POA: Diagnosis not present

## 2020-03-06 DIAGNOSIS — M25561 Pain in right knee: Secondary | ICD-10-CM | POA: Diagnosis not present

## 2020-03-06 DIAGNOSIS — M25562 Pain in left knee: Secondary | ICD-10-CM | POA: Diagnosis not present

## 2020-03-06 DIAGNOSIS — M8589 Other specified disorders of bone density and structure, multiple sites: Secondary | ICD-10-CM | POA: Diagnosis not present

## 2020-03-06 DIAGNOSIS — M25461 Effusion, right knee: Secondary | ICD-10-CM | POA: Diagnosis not present

## 2020-03-06 DIAGNOSIS — T383X5A Adverse effect of insulin and oral hypoglycemic [antidiabetic] drugs, initial encounter: Secondary | ICD-10-CM | POA: Diagnosis not present

## 2020-03-06 DIAGNOSIS — R42 Dizziness and giddiness: Secondary | ICD-10-CM | POA: Diagnosis not present

## 2020-03-06 DIAGNOSIS — Z794 Long term (current) use of insulin: Secondary | ICD-10-CM | POA: Diagnosis not present

## 2020-03-06 DIAGNOSIS — M1990 Unspecified osteoarthritis, unspecified site: Secondary | ICD-10-CM | POA: Diagnosis not present

## 2020-03-06 NOTE — Telephone Encounter (Signed)
New message     Holly Weiss - POA calling   1.  Checking on the status of my chart message on  7.25.21.   2. Referral to geriatric  in [facility] - [phone]

## 2020-03-06 NOTE — Telephone Encounter (Signed)
Called pts POA and was unable to leave a VM at this time.

## 2020-03-06 NOTE — Telephone Encounter (Signed)
F/u   Holly Weiss - POA  Calling back asking to speak with the CMA on  concerns of dementia & Hallucination.

## 2020-03-07 DIAGNOSIS — I459 Conduction disorder, unspecified: Secondary | ICD-10-CM | POA: Diagnosis not present

## 2020-03-07 DIAGNOSIS — Z794 Long term (current) use of insulin: Secondary | ICD-10-CM | POA: Diagnosis not present

## 2020-03-07 DIAGNOSIS — E11649 Type 2 diabetes mellitus with hypoglycemia without coma: Secondary | ICD-10-CM | POA: Diagnosis not present

## 2020-03-07 DIAGNOSIS — R55 Syncope and collapse: Secondary | ICD-10-CM | POA: Diagnosis not present

## 2020-03-07 DIAGNOSIS — G309 Alzheimer's disease, unspecified: Secondary | ICD-10-CM | POA: Diagnosis not present

## 2020-03-08 DIAGNOSIS — Z7409 Other reduced mobility: Secondary | ICD-10-CM | POA: Diagnosis not present

## 2020-03-08 DIAGNOSIS — M949 Disorder of cartilage, unspecified: Secondary | ICD-10-CM | POA: Diagnosis not present

## 2020-03-08 DIAGNOSIS — E11649 Type 2 diabetes mellitus with hypoglycemia without coma: Secondary | ICD-10-CM | POA: Diagnosis not present

## 2020-03-08 DIAGNOSIS — Z789 Other specified health status: Secondary | ICD-10-CM | POA: Diagnosis not present

## 2020-03-08 DIAGNOSIS — M810 Age-related osteoporosis without current pathological fracture: Secondary | ICD-10-CM | POA: Diagnosis not present

## 2020-03-08 DIAGNOSIS — Z794 Long term (current) use of insulin: Secondary | ICD-10-CM | POA: Diagnosis not present

## 2020-03-08 DIAGNOSIS — G9529 Other cord compression: Secondary | ICD-10-CM | POA: Diagnosis not present

## 2020-03-08 DIAGNOSIS — M899 Disorder of bone, unspecified: Secondary | ICD-10-CM | POA: Diagnosis not present

## 2020-03-08 DIAGNOSIS — G309 Alzheimer's disease, unspecified: Secondary | ICD-10-CM | POA: Diagnosis not present

## 2020-03-11 DIAGNOSIS — I110 Hypertensive heart disease with heart failure: Secondary | ICD-10-CM | POA: Diagnosis not present

## 2020-03-11 DIAGNOSIS — Z951 Presence of aortocoronary bypass graft: Secondary | ICD-10-CM | POA: Diagnosis not present

## 2020-03-11 DIAGNOSIS — Z9181 History of falling: Secondary | ICD-10-CM | POA: Diagnosis not present

## 2020-03-11 DIAGNOSIS — Z794 Long term (current) use of insulin: Secondary | ICD-10-CM | POA: Diagnosis not present

## 2020-03-11 DIAGNOSIS — I2510 Atherosclerotic heart disease of native coronary artery without angina pectoris: Secondary | ICD-10-CM | POA: Diagnosis not present

## 2020-03-11 DIAGNOSIS — M4316 Spondylolisthesis, lumbar region: Secondary | ICD-10-CM | POA: Diagnosis not present

## 2020-03-11 DIAGNOSIS — Z7982 Long term (current) use of aspirin: Secondary | ICD-10-CM | POA: Diagnosis not present

## 2020-03-11 DIAGNOSIS — J45909 Unspecified asthma, uncomplicated: Secondary | ICD-10-CM | POA: Diagnosis not present

## 2020-03-11 DIAGNOSIS — M1990 Unspecified osteoarthritis, unspecified site: Secondary | ICD-10-CM | POA: Diagnosis not present

## 2020-03-11 DIAGNOSIS — M810 Age-related osteoporosis without current pathological fracture: Secondary | ICD-10-CM | POA: Diagnosis not present

## 2020-03-11 DIAGNOSIS — E11319 Type 2 diabetes mellitus with unspecified diabetic retinopathy without macular edema: Secondary | ICD-10-CM | POA: Diagnosis not present

## 2020-03-11 DIAGNOSIS — Z96651 Presence of right artificial knee joint: Secondary | ICD-10-CM | POA: Diagnosis not present

## 2020-03-11 DIAGNOSIS — D649 Anemia, unspecified: Secondary | ICD-10-CM | POA: Diagnosis not present

## 2020-03-11 DIAGNOSIS — Z8781 Personal history of (healed) traumatic fracture: Secondary | ICD-10-CM | POA: Diagnosis not present

## 2020-03-11 DIAGNOSIS — M4802 Spinal stenosis, cervical region: Secondary | ICD-10-CM | POA: Diagnosis not present

## 2020-03-11 DIAGNOSIS — E785 Hyperlipidemia, unspecified: Secondary | ICD-10-CM | POA: Diagnosis not present

## 2020-03-11 DIAGNOSIS — M4805 Spinal stenosis, thoracolumbar region: Secondary | ICD-10-CM | POA: Diagnosis not present

## 2020-03-11 DIAGNOSIS — I5040 Unspecified combined systolic (congestive) and diastolic (congestive) heart failure: Secondary | ICD-10-CM | POA: Diagnosis not present

## 2020-03-11 DIAGNOSIS — K219 Gastro-esophageal reflux disease without esophagitis: Secondary | ICD-10-CM | POA: Diagnosis not present

## 2020-03-12 ENCOUNTER — Telehealth: Payer: Self-pay

## 2020-03-12 DIAGNOSIS — J4520 Mild intermittent asthma, uncomplicated: Secondary | ICD-10-CM | POA: Diagnosis not present

## 2020-03-12 DIAGNOSIS — I509 Heart failure, unspecified: Secondary | ICD-10-CM | POA: Diagnosis not present

## 2020-03-12 NOTE — Telephone Encounter (Signed)
New message   Seen on yesterday / admit  for PT need verbal orders    One week x one   Two time x two   One week x two

## 2020-03-13 DIAGNOSIS — J45909 Unspecified asthma, uncomplicated: Secondary | ICD-10-CM | POA: Diagnosis not present

## 2020-03-13 DIAGNOSIS — E11319 Type 2 diabetes mellitus with unspecified diabetic retinopathy without macular edema: Secondary | ICD-10-CM | POA: Diagnosis not present

## 2020-03-13 DIAGNOSIS — M4802 Spinal stenosis, cervical region: Secondary | ICD-10-CM | POA: Diagnosis not present

## 2020-03-13 DIAGNOSIS — E1165 Type 2 diabetes mellitus with hyperglycemia: Secondary | ICD-10-CM | POA: Diagnosis not present

## 2020-03-13 DIAGNOSIS — D649 Anemia, unspecified: Secondary | ICD-10-CM | POA: Diagnosis not present

## 2020-03-13 DIAGNOSIS — K219 Gastro-esophageal reflux disease without esophagitis: Secondary | ICD-10-CM | POA: Diagnosis not present

## 2020-03-13 DIAGNOSIS — I5040 Unspecified combined systolic (congestive) and diastolic (congestive) heart failure: Secondary | ICD-10-CM | POA: Diagnosis not present

## 2020-03-13 DIAGNOSIS — Z951 Presence of aortocoronary bypass graft: Secondary | ICD-10-CM | POA: Diagnosis not present

## 2020-03-13 DIAGNOSIS — M810 Age-related osteoporosis without current pathological fracture: Secondary | ICD-10-CM | POA: Diagnosis not present

## 2020-03-13 DIAGNOSIS — I2510 Atherosclerotic heart disease of native coronary artery without angina pectoris: Secondary | ICD-10-CM | POA: Diagnosis not present

## 2020-03-13 DIAGNOSIS — Z96651 Presence of right artificial knee joint: Secondary | ICD-10-CM | POA: Diagnosis not present

## 2020-03-13 DIAGNOSIS — G301 Alzheimer's disease with late onset: Secondary | ICD-10-CM | POA: Diagnosis not present

## 2020-03-13 DIAGNOSIS — M4316 Spondylolisthesis, lumbar region: Secondary | ICD-10-CM | POA: Diagnosis not present

## 2020-03-13 DIAGNOSIS — I110 Hypertensive heart disease with heart failure: Secondary | ICD-10-CM | POA: Diagnosis not present

## 2020-03-13 DIAGNOSIS — Z7982 Long term (current) use of aspirin: Secondary | ICD-10-CM | POA: Diagnosis not present

## 2020-03-13 DIAGNOSIS — Z8781 Personal history of (healed) traumatic fracture: Secondary | ICD-10-CM | POA: Diagnosis not present

## 2020-03-13 DIAGNOSIS — Z794 Long term (current) use of insulin: Secondary | ICD-10-CM | POA: Diagnosis not present

## 2020-03-13 DIAGNOSIS — M4805 Spinal stenosis, thoracolumbar region: Secondary | ICD-10-CM | POA: Diagnosis not present

## 2020-03-13 DIAGNOSIS — I5020 Unspecified systolic (congestive) heart failure: Secondary | ICD-10-CM | POA: Diagnosis not present

## 2020-03-13 DIAGNOSIS — M1990 Unspecified osteoarthritis, unspecified site: Secondary | ICD-10-CM | POA: Diagnosis not present

## 2020-03-13 DIAGNOSIS — E785 Hyperlipidemia, unspecified: Secondary | ICD-10-CM | POA: Diagnosis not present

## 2020-03-13 DIAGNOSIS — Z9181 History of falling: Secondary | ICD-10-CM | POA: Diagnosis not present

## 2020-03-13 DIAGNOSIS — R600 Localized edema: Secondary | ICD-10-CM | POA: Diagnosis not present

## 2020-03-13 NOTE — Telephone Encounter (Signed)
Ok for verbals 

## 2020-03-13 NOTE — Telephone Encounter (Signed)
Sent to Dr. [name]. 

## 2020-03-14 NOTE — Telephone Encounter (Signed)
Verbals given  

## 2020-03-18 ENCOUNTER — Telehealth: Payer: Self-pay | Admitting: Internal Medicine

## 2020-03-18 DIAGNOSIS — J45909 Unspecified asthma, uncomplicated: Secondary | ICD-10-CM | POA: Diagnosis not present

## 2020-03-18 DIAGNOSIS — Z96651 Presence of right artificial knee joint: Secondary | ICD-10-CM | POA: Diagnosis not present

## 2020-03-18 DIAGNOSIS — M4802 Spinal stenosis, cervical region: Secondary | ICD-10-CM | POA: Diagnosis not present

## 2020-03-18 DIAGNOSIS — E785 Hyperlipidemia, unspecified: Secondary | ICD-10-CM | POA: Diagnosis not present

## 2020-03-18 DIAGNOSIS — Z7982 Long term (current) use of aspirin: Secondary | ICD-10-CM | POA: Diagnosis not present

## 2020-03-18 DIAGNOSIS — Z794 Long term (current) use of insulin: Secondary | ICD-10-CM | POA: Diagnosis not present

## 2020-03-18 DIAGNOSIS — M810 Age-related osteoporosis without current pathological fracture: Secondary | ICD-10-CM | POA: Diagnosis not present

## 2020-03-18 DIAGNOSIS — Z951 Presence of aortocoronary bypass graft: Secondary | ICD-10-CM | POA: Diagnosis not present

## 2020-03-18 DIAGNOSIS — K219 Gastro-esophageal reflux disease without esophagitis: Secondary | ICD-10-CM | POA: Diagnosis not present

## 2020-03-18 DIAGNOSIS — Z9181 History of falling: Secondary | ICD-10-CM | POA: Diagnosis not present

## 2020-03-18 DIAGNOSIS — E11319 Type 2 diabetes mellitus with unspecified diabetic retinopathy without macular edema: Secondary | ICD-10-CM | POA: Diagnosis not present

## 2020-03-18 DIAGNOSIS — I110 Hypertensive heart disease with heart failure: Secondary | ICD-10-CM | POA: Diagnosis not present

## 2020-03-18 DIAGNOSIS — I5040 Unspecified combined systolic (congestive) and diastolic (congestive) heart failure: Secondary | ICD-10-CM | POA: Diagnosis not present

## 2020-03-18 DIAGNOSIS — M4316 Spondylolisthesis, lumbar region: Secondary | ICD-10-CM | POA: Diagnosis not present

## 2020-03-18 DIAGNOSIS — M1990 Unspecified osteoarthritis, unspecified site: Secondary | ICD-10-CM | POA: Diagnosis not present

## 2020-03-18 DIAGNOSIS — Z8781 Personal history of (healed) traumatic fracture: Secondary | ICD-10-CM | POA: Diagnosis not present

## 2020-03-18 DIAGNOSIS — D649 Anemia, unspecified: Secondary | ICD-10-CM | POA: Diagnosis not present

## 2020-03-18 DIAGNOSIS — M4805 Spinal stenosis, thoracolumbar region: Secondary | ICD-10-CM | POA: Diagnosis not present

## 2020-03-18 DIAGNOSIS — I2510 Atherosclerotic heart disease of native coronary artery without angina pectoris: Secondary | ICD-10-CM | POA: Diagnosis not present

## 2020-03-18 NOTE — Telephone Encounter (Signed)
Ok for verbal 

## 2020-03-18 NOTE — Telephone Encounter (Signed)
Holly Weiss called from [facility] of therapy:  2 WKS 1  1 WK 2  Everything is going well.

## 2020-03-18 NOTE — Telephone Encounter (Signed)
Sent to Dr. [name]. 

## 2020-03-19 DIAGNOSIS — M4802 Spinal stenosis, cervical region: Secondary | ICD-10-CM | POA: Diagnosis not present

## 2020-03-19 DIAGNOSIS — I110 Hypertensive heart disease with heart failure: Secondary | ICD-10-CM | POA: Diagnosis not present

## 2020-03-19 DIAGNOSIS — Z8781 Personal history of (healed) traumatic fracture: Secondary | ICD-10-CM | POA: Diagnosis not present

## 2020-03-19 DIAGNOSIS — I2510 Atherosclerotic heart disease of native coronary artery without angina pectoris: Secondary | ICD-10-CM | POA: Diagnosis not present

## 2020-03-19 DIAGNOSIS — D649 Anemia, unspecified: Secondary | ICD-10-CM | POA: Diagnosis not present

## 2020-03-19 DIAGNOSIS — M4316 Spondylolisthesis, lumbar region: Secondary | ICD-10-CM | POA: Diagnosis not present

## 2020-03-19 DIAGNOSIS — J45909 Unspecified asthma, uncomplicated: Secondary | ICD-10-CM | POA: Diagnosis not present

## 2020-03-19 DIAGNOSIS — Z9181 History of falling: Secondary | ICD-10-CM | POA: Diagnosis not present

## 2020-03-19 DIAGNOSIS — M1990 Unspecified osteoarthritis, unspecified site: Secondary | ICD-10-CM | POA: Diagnosis not present

## 2020-03-19 DIAGNOSIS — E785 Hyperlipidemia, unspecified: Secondary | ICD-10-CM | POA: Diagnosis not present

## 2020-03-19 DIAGNOSIS — I5040 Unspecified combined systolic (congestive) and diastolic (congestive) heart failure: Secondary | ICD-10-CM | POA: Diagnosis not present

## 2020-03-19 DIAGNOSIS — M810 Age-related osteoporosis without current pathological fracture: Secondary | ICD-10-CM | POA: Diagnosis not present

## 2020-03-19 DIAGNOSIS — E11319 Type 2 diabetes mellitus with unspecified diabetic retinopathy without macular edema: Secondary | ICD-10-CM | POA: Diagnosis not present

## 2020-03-19 DIAGNOSIS — Z7982 Long term (current) use of aspirin: Secondary | ICD-10-CM | POA: Diagnosis not present

## 2020-03-19 DIAGNOSIS — Z96651 Presence of right artificial knee joint: Secondary | ICD-10-CM | POA: Diagnosis not present

## 2020-03-19 DIAGNOSIS — K219 Gastro-esophageal reflux disease without esophagitis: Secondary | ICD-10-CM | POA: Diagnosis not present

## 2020-03-19 DIAGNOSIS — M4805 Spinal stenosis, thoracolumbar region: Secondary | ICD-10-CM | POA: Diagnosis not present

## 2020-03-19 DIAGNOSIS — Z951 Presence of aortocoronary bypass graft: Secondary | ICD-10-CM | POA: Diagnosis not present

## 2020-03-19 DIAGNOSIS — Z794 Long term (current) use of insulin: Secondary | ICD-10-CM | POA: Diagnosis not present

## 2020-03-20 NOTE — Telephone Encounter (Signed)
New message    Verbal order for nursing develop pressure sore on heel.    One time a week for eval

## 2020-03-20 NOTE — Telephone Encounter (Signed)
Verbal orders given  

## 2020-03-21 DIAGNOSIS — Z7982 Long term (current) use of aspirin: Secondary | ICD-10-CM | POA: Diagnosis not present

## 2020-03-21 DIAGNOSIS — E11319 Type 2 diabetes mellitus with unspecified diabetic retinopathy without macular edema: Secondary | ICD-10-CM | POA: Diagnosis not present

## 2020-03-21 DIAGNOSIS — M810 Age-related osteoporosis without current pathological fracture: Secondary | ICD-10-CM | POA: Diagnosis not present

## 2020-03-21 DIAGNOSIS — I2510 Atherosclerotic heart disease of native coronary artery without angina pectoris: Secondary | ICD-10-CM | POA: Diagnosis not present

## 2020-03-21 DIAGNOSIS — Z8781 Personal history of (healed) traumatic fracture: Secondary | ICD-10-CM | POA: Diagnosis not present

## 2020-03-21 DIAGNOSIS — Z951 Presence of aortocoronary bypass graft: Secondary | ICD-10-CM | POA: Diagnosis not present

## 2020-03-21 DIAGNOSIS — M4802 Spinal stenosis, cervical region: Secondary | ICD-10-CM | POA: Diagnosis not present

## 2020-03-21 DIAGNOSIS — E785 Hyperlipidemia, unspecified: Secondary | ICD-10-CM | POA: Diagnosis not present

## 2020-03-21 DIAGNOSIS — J45909 Unspecified asthma, uncomplicated: Secondary | ICD-10-CM | POA: Diagnosis not present

## 2020-03-21 DIAGNOSIS — I110 Hypertensive heart disease with heart failure: Secondary | ICD-10-CM | POA: Diagnosis not present

## 2020-03-21 DIAGNOSIS — M1990 Unspecified osteoarthritis, unspecified site: Secondary | ICD-10-CM | POA: Diagnosis not present

## 2020-03-21 DIAGNOSIS — Z9181 History of falling: Secondary | ICD-10-CM | POA: Diagnosis not present

## 2020-03-21 DIAGNOSIS — Z96651 Presence of right artificial knee joint: Secondary | ICD-10-CM | POA: Diagnosis not present

## 2020-03-21 DIAGNOSIS — I5040 Unspecified combined systolic (congestive) and diastolic (congestive) heart failure: Secondary | ICD-10-CM | POA: Diagnosis not present

## 2020-03-21 DIAGNOSIS — M4316 Spondylolisthesis, lumbar region: Secondary | ICD-10-CM | POA: Diagnosis not present

## 2020-03-21 DIAGNOSIS — M4805 Spinal stenosis, thoracolumbar region: Secondary | ICD-10-CM | POA: Diagnosis not present

## 2020-03-21 DIAGNOSIS — Z794 Long term (current) use of insulin: Secondary | ICD-10-CM | POA: Diagnosis not present

## 2020-03-21 DIAGNOSIS — D649 Anemia, unspecified: Secondary | ICD-10-CM | POA: Diagnosis not present

## 2020-03-21 DIAGNOSIS — K219 Gastro-esophageal reflux disease without esophagitis: Secondary | ICD-10-CM | POA: Diagnosis not present

## 2020-03-25 DIAGNOSIS — G301 Alzheimer's disease with late onset: Secondary | ICD-10-CM | POA: Diagnosis not present

## 2020-03-25 DIAGNOSIS — E1165 Type 2 diabetes mellitus with hyperglycemia: Secondary | ICD-10-CM | POA: Diagnosis not present

## 2020-03-25 DIAGNOSIS — I2510 Atherosclerotic heart disease of native coronary artery without angina pectoris: Secondary | ICD-10-CM | POA: Diagnosis not present

## 2020-03-25 DIAGNOSIS — I5020 Unspecified systolic (congestive) heart failure: Secondary | ICD-10-CM | POA: Diagnosis not present

## 2020-03-25 DIAGNOSIS — G9520 Unspecified cord compression: Secondary | ICD-10-CM | POA: Diagnosis not present

## 2020-03-26 DIAGNOSIS — M810 Age-related osteoporosis without current pathological fracture: Secondary | ICD-10-CM | POA: Diagnosis not present

## 2020-03-26 DIAGNOSIS — Z794 Long term (current) use of insulin: Secondary | ICD-10-CM | POA: Diagnosis not present

## 2020-03-26 DIAGNOSIS — Z9181 History of falling: Secondary | ICD-10-CM | POA: Diagnosis not present

## 2020-03-26 DIAGNOSIS — M1990 Unspecified osteoarthritis, unspecified site: Secondary | ICD-10-CM | POA: Diagnosis not present

## 2020-03-26 DIAGNOSIS — M4316 Spondylolisthesis, lumbar region: Secondary | ICD-10-CM | POA: Diagnosis not present

## 2020-03-26 DIAGNOSIS — Z7982 Long term (current) use of aspirin: Secondary | ICD-10-CM | POA: Diagnosis not present

## 2020-03-26 DIAGNOSIS — Z951 Presence of aortocoronary bypass graft: Secondary | ICD-10-CM | POA: Diagnosis not present

## 2020-03-26 DIAGNOSIS — M4805 Spinal stenosis, thoracolumbar region: Secondary | ICD-10-CM | POA: Diagnosis not present

## 2020-03-26 DIAGNOSIS — Z8781 Personal history of (healed) traumatic fracture: Secondary | ICD-10-CM | POA: Diagnosis not present

## 2020-03-26 DIAGNOSIS — I2510 Atherosclerotic heart disease of native coronary artery without angina pectoris: Secondary | ICD-10-CM | POA: Diagnosis not present

## 2020-03-26 DIAGNOSIS — E11319 Type 2 diabetes mellitus with unspecified diabetic retinopathy without macular edema: Secondary | ICD-10-CM | POA: Diagnosis not present

## 2020-03-26 DIAGNOSIS — E785 Hyperlipidemia, unspecified: Secondary | ICD-10-CM | POA: Diagnosis not present

## 2020-03-26 DIAGNOSIS — M4802 Spinal stenosis, cervical region: Secondary | ICD-10-CM | POA: Diagnosis not present

## 2020-03-26 DIAGNOSIS — D649 Anemia, unspecified: Secondary | ICD-10-CM | POA: Diagnosis not present

## 2020-03-26 DIAGNOSIS — K219 Gastro-esophageal reflux disease without esophagitis: Secondary | ICD-10-CM | POA: Diagnosis not present

## 2020-03-26 DIAGNOSIS — I5040 Unspecified combined systolic (congestive) and diastolic (congestive) heart failure: Secondary | ICD-10-CM | POA: Diagnosis not present

## 2020-03-26 DIAGNOSIS — J45909 Unspecified asthma, uncomplicated: Secondary | ICD-10-CM | POA: Diagnosis not present

## 2020-03-26 DIAGNOSIS — I110 Hypertensive heart disease with heart failure: Secondary | ICD-10-CM | POA: Diagnosis not present

## 2020-03-26 DIAGNOSIS — Z96651 Presence of right artificial knee joint: Secondary | ICD-10-CM | POA: Diagnosis not present

## 2020-03-27 DIAGNOSIS — I110 Hypertensive heart disease with heart failure: Secondary | ICD-10-CM | POA: Diagnosis not present

## 2020-03-27 DIAGNOSIS — Z794 Long term (current) use of insulin: Secondary | ICD-10-CM | POA: Diagnosis not present

## 2020-03-27 DIAGNOSIS — J45909 Unspecified asthma, uncomplicated: Secondary | ICD-10-CM | POA: Diagnosis not present

## 2020-03-27 DIAGNOSIS — Z96651 Presence of right artificial knee joint: Secondary | ICD-10-CM | POA: Diagnosis not present

## 2020-03-27 DIAGNOSIS — M1990 Unspecified osteoarthritis, unspecified site: Secondary | ICD-10-CM | POA: Diagnosis not present

## 2020-03-27 DIAGNOSIS — I5040 Unspecified combined systolic (congestive) and diastolic (congestive) heart failure: Secondary | ICD-10-CM | POA: Diagnosis not present

## 2020-03-27 DIAGNOSIS — Z951 Presence of aortocoronary bypass graft: Secondary | ICD-10-CM | POA: Diagnosis not present

## 2020-03-27 DIAGNOSIS — E11319 Type 2 diabetes mellitus with unspecified diabetic retinopathy without macular edema: Secondary | ICD-10-CM | POA: Diagnosis not present

## 2020-03-27 DIAGNOSIS — M4316 Spondylolisthesis, lumbar region: Secondary | ICD-10-CM | POA: Diagnosis not present

## 2020-03-27 DIAGNOSIS — D649 Anemia, unspecified: Secondary | ICD-10-CM | POA: Diagnosis not present

## 2020-03-27 DIAGNOSIS — I2510 Atherosclerotic heart disease of native coronary artery without angina pectoris: Secondary | ICD-10-CM | POA: Diagnosis not present

## 2020-03-27 DIAGNOSIS — E785 Hyperlipidemia, unspecified: Secondary | ICD-10-CM | POA: Diagnosis not present

## 2020-03-27 DIAGNOSIS — Z9181 History of falling: Secondary | ICD-10-CM | POA: Diagnosis not present

## 2020-03-27 DIAGNOSIS — Z7982 Long term (current) use of aspirin: Secondary | ICD-10-CM | POA: Diagnosis not present

## 2020-03-27 DIAGNOSIS — M4802 Spinal stenosis, cervical region: Secondary | ICD-10-CM | POA: Diagnosis not present

## 2020-03-27 DIAGNOSIS — Z8781 Personal history of (healed) traumatic fracture: Secondary | ICD-10-CM | POA: Diagnosis not present

## 2020-03-27 DIAGNOSIS — M810 Age-related osteoporosis without current pathological fracture: Secondary | ICD-10-CM | POA: Diagnosis not present

## 2020-03-27 DIAGNOSIS — K219 Gastro-esophageal reflux disease without esophagitis: Secondary | ICD-10-CM | POA: Diagnosis not present

## 2020-03-27 DIAGNOSIS — M4805 Spinal stenosis, thoracolumbar region: Secondary | ICD-10-CM | POA: Diagnosis not present

## 2020-03-28 ENCOUNTER — Telehealth: Payer: Self-pay

## 2020-03-28 DIAGNOSIS — M1990 Unspecified osteoarthritis, unspecified site: Secondary | ICD-10-CM | POA: Diagnosis not present

## 2020-03-28 DIAGNOSIS — E11319 Type 2 diabetes mellitus with unspecified diabetic retinopathy without macular edema: Secondary | ICD-10-CM | POA: Diagnosis not present

## 2020-03-28 DIAGNOSIS — J45909 Unspecified asthma, uncomplicated: Secondary | ICD-10-CM | POA: Diagnosis not present

## 2020-03-28 DIAGNOSIS — Z7982 Long term (current) use of aspirin: Secondary | ICD-10-CM | POA: Diagnosis not present

## 2020-03-28 DIAGNOSIS — M810 Age-related osteoporosis without current pathological fracture: Secondary | ICD-10-CM | POA: Diagnosis not present

## 2020-03-28 DIAGNOSIS — Z9181 History of falling: Secondary | ICD-10-CM | POA: Diagnosis not present

## 2020-03-28 DIAGNOSIS — M4805 Spinal stenosis, thoracolumbar region: Secondary | ICD-10-CM | POA: Diagnosis not present

## 2020-03-28 DIAGNOSIS — M4802 Spinal stenosis, cervical region: Secondary | ICD-10-CM | POA: Diagnosis not present

## 2020-03-28 DIAGNOSIS — Z794 Long term (current) use of insulin: Secondary | ICD-10-CM | POA: Diagnosis not present

## 2020-03-28 DIAGNOSIS — I2510 Atherosclerotic heart disease of native coronary artery without angina pectoris: Secondary | ICD-10-CM | POA: Diagnosis not present

## 2020-03-28 DIAGNOSIS — E785 Hyperlipidemia, unspecified: Secondary | ICD-10-CM | POA: Diagnosis not present

## 2020-03-28 DIAGNOSIS — Z951 Presence of aortocoronary bypass graft: Secondary | ICD-10-CM | POA: Diagnosis not present

## 2020-03-28 DIAGNOSIS — I110 Hypertensive heart disease with heart failure: Secondary | ICD-10-CM | POA: Diagnosis not present

## 2020-03-28 DIAGNOSIS — Z8781 Personal history of (healed) traumatic fracture: Secondary | ICD-10-CM | POA: Diagnosis not present

## 2020-03-28 DIAGNOSIS — D649 Anemia, unspecified: Secondary | ICD-10-CM | POA: Diagnosis not present

## 2020-03-28 DIAGNOSIS — K219 Gastro-esophageal reflux disease without esophagitis: Secondary | ICD-10-CM | POA: Diagnosis not present

## 2020-03-28 DIAGNOSIS — M4316 Spondylolisthesis, lumbar region: Secondary | ICD-10-CM | POA: Diagnosis not present

## 2020-03-28 DIAGNOSIS — Z96651 Presence of right artificial knee joint: Secondary | ICD-10-CM | POA: Diagnosis not present

## 2020-03-28 DIAGNOSIS — I5040 Unspecified combined systolic (congestive) and diastolic (congestive) heart failure: Secondary | ICD-10-CM | POA: Diagnosis not present

## 2020-03-28 NOTE — Telephone Encounter (Signed)
New message    Need verbals orders for wound care   Please advise

## 2020-03-29 NOTE — Telephone Encounter (Signed)
Ok for verbals 

## 2020-03-29 NOTE — Telephone Encounter (Signed)
Sent to Dr. [name]. 

## 2020-04-01 ENCOUNTER — Telehealth: Payer: Self-pay | Admitting: Internal Medicine

## 2020-04-01 NOTE — Telephone Encounter (Signed)
Wound care orders Nursing see the patient twice weekly for 7 weeks to do wound care on her heels

## 2020-04-02 DIAGNOSIS — Z951 Presence of aortocoronary bypass graft: Secondary | ICD-10-CM | POA: Diagnosis not present

## 2020-04-02 DIAGNOSIS — I110 Hypertensive heart disease with heart failure: Secondary | ICD-10-CM | POA: Diagnosis not present

## 2020-04-02 DIAGNOSIS — M4802 Spinal stenosis, cervical region: Secondary | ICD-10-CM | POA: Diagnosis not present

## 2020-04-02 DIAGNOSIS — M810 Age-related osteoporosis without current pathological fracture: Secondary | ICD-10-CM | POA: Diagnosis not present

## 2020-04-02 DIAGNOSIS — D649 Anemia, unspecified: Secondary | ICD-10-CM | POA: Diagnosis not present

## 2020-04-02 DIAGNOSIS — E11319 Type 2 diabetes mellitus with unspecified diabetic retinopathy without macular edema: Secondary | ICD-10-CM | POA: Diagnosis not present

## 2020-04-02 DIAGNOSIS — M4805 Spinal stenosis, thoracolumbar region: Secondary | ICD-10-CM | POA: Diagnosis not present

## 2020-04-02 DIAGNOSIS — I2510 Atherosclerotic heart disease of native coronary artery without angina pectoris: Secondary | ICD-10-CM | POA: Diagnosis not present

## 2020-04-02 DIAGNOSIS — Z7982 Long term (current) use of aspirin: Secondary | ICD-10-CM | POA: Diagnosis not present

## 2020-04-02 DIAGNOSIS — M4316 Spondylolisthesis, lumbar region: Secondary | ICD-10-CM | POA: Diagnosis not present

## 2020-04-02 DIAGNOSIS — E785 Hyperlipidemia, unspecified: Secondary | ICD-10-CM | POA: Diagnosis not present

## 2020-04-02 DIAGNOSIS — J45909 Unspecified asthma, uncomplicated: Secondary | ICD-10-CM | POA: Diagnosis not present

## 2020-04-02 DIAGNOSIS — Z8781 Personal history of (healed) traumatic fracture: Secondary | ICD-10-CM | POA: Diagnosis not present

## 2020-04-02 DIAGNOSIS — Z9181 History of falling: Secondary | ICD-10-CM | POA: Diagnosis not present

## 2020-04-02 DIAGNOSIS — I5040 Unspecified combined systolic (congestive) and diastolic (congestive) heart failure: Secondary | ICD-10-CM | POA: Diagnosis not present

## 2020-04-02 DIAGNOSIS — M1990 Unspecified osteoarthritis, unspecified site: Secondary | ICD-10-CM | POA: Diagnosis not present

## 2020-04-02 DIAGNOSIS — Z794 Long term (current) use of insulin: Secondary | ICD-10-CM | POA: Diagnosis not present

## 2020-04-02 DIAGNOSIS — K219 Gastro-esophageal reflux disease without esophagitis: Secondary | ICD-10-CM | POA: Diagnosis not present

## 2020-04-02 DIAGNOSIS — Z96651 Presence of right artificial knee joint: Secondary | ICD-10-CM | POA: Diagnosis not present

## 2020-04-03 DIAGNOSIS — D649 Anemia, unspecified: Secondary | ICD-10-CM | POA: Diagnosis not present

## 2020-04-03 DIAGNOSIS — M4802 Spinal stenosis, cervical region: Secondary | ICD-10-CM | POA: Diagnosis not present

## 2020-04-03 DIAGNOSIS — J45909 Unspecified asthma, uncomplicated: Secondary | ICD-10-CM | POA: Diagnosis not present

## 2020-04-03 DIAGNOSIS — Z7982 Long term (current) use of aspirin: Secondary | ICD-10-CM | POA: Diagnosis not present

## 2020-04-03 DIAGNOSIS — Z794 Long term (current) use of insulin: Secondary | ICD-10-CM | POA: Diagnosis not present

## 2020-04-03 DIAGNOSIS — E11319 Type 2 diabetes mellitus with unspecified diabetic retinopathy without macular edema: Secondary | ICD-10-CM | POA: Diagnosis not present

## 2020-04-03 DIAGNOSIS — Z8781 Personal history of (healed) traumatic fracture: Secondary | ICD-10-CM | POA: Diagnosis not present

## 2020-04-03 DIAGNOSIS — Z951 Presence of aortocoronary bypass graft: Secondary | ICD-10-CM | POA: Diagnosis not present

## 2020-04-03 DIAGNOSIS — Z96651 Presence of right artificial knee joint: Secondary | ICD-10-CM | POA: Diagnosis not present

## 2020-04-03 DIAGNOSIS — M1990 Unspecified osteoarthritis, unspecified site: Secondary | ICD-10-CM | POA: Diagnosis not present

## 2020-04-03 DIAGNOSIS — I2510 Atherosclerotic heart disease of native coronary artery without angina pectoris: Secondary | ICD-10-CM | POA: Diagnosis not present

## 2020-04-03 DIAGNOSIS — K219 Gastro-esophageal reflux disease without esophagitis: Secondary | ICD-10-CM | POA: Diagnosis not present

## 2020-04-03 DIAGNOSIS — M4805 Spinal stenosis, thoracolumbar region: Secondary | ICD-10-CM | POA: Diagnosis not present

## 2020-04-03 DIAGNOSIS — I5040 Unspecified combined systolic (congestive) and diastolic (congestive) heart failure: Secondary | ICD-10-CM | POA: Diagnosis not present

## 2020-04-03 DIAGNOSIS — E785 Hyperlipidemia, unspecified: Secondary | ICD-10-CM | POA: Diagnosis not present

## 2020-04-03 DIAGNOSIS — M4316 Spondylolisthesis, lumbar region: Secondary | ICD-10-CM | POA: Diagnosis not present

## 2020-04-03 DIAGNOSIS — I110 Hypertensive heart disease with heart failure: Secondary | ICD-10-CM | POA: Diagnosis not present

## 2020-04-03 DIAGNOSIS — Z9181 History of falling: Secondary | ICD-10-CM | POA: Diagnosis not present

## 2020-04-03 DIAGNOSIS — M810 Age-related osteoporosis without current pathological fracture: Secondary | ICD-10-CM | POA: Diagnosis not present

## 2020-04-04 DIAGNOSIS — M4316 Spondylolisthesis, lumbar region: Secondary | ICD-10-CM | POA: Diagnosis not present

## 2020-04-04 DIAGNOSIS — Z96651 Presence of right artificial knee joint: Secondary | ICD-10-CM | POA: Diagnosis not present

## 2020-04-04 DIAGNOSIS — Z951 Presence of aortocoronary bypass graft: Secondary | ICD-10-CM | POA: Diagnosis not present

## 2020-04-04 DIAGNOSIS — K219 Gastro-esophageal reflux disease without esophagitis: Secondary | ICD-10-CM | POA: Diagnosis not present

## 2020-04-04 DIAGNOSIS — Z794 Long term (current) use of insulin: Secondary | ICD-10-CM | POA: Diagnosis not present

## 2020-04-04 DIAGNOSIS — J45909 Unspecified asthma, uncomplicated: Secondary | ICD-10-CM | POA: Diagnosis not present

## 2020-04-04 DIAGNOSIS — M4805 Spinal stenosis, thoracolumbar region: Secondary | ICD-10-CM | POA: Diagnosis not present

## 2020-04-04 DIAGNOSIS — M1990 Unspecified osteoarthritis, unspecified site: Secondary | ICD-10-CM | POA: Diagnosis not present

## 2020-04-04 DIAGNOSIS — I110 Hypertensive heart disease with heart failure: Secondary | ICD-10-CM | POA: Diagnosis not present

## 2020-04-04 DIAGNOSIS — M810 Age-related osteoporosis without current pathological fracture: Secondary | ICD-10-CM | POA: Diagnosis not present

## 2020-04-04 DIAGNOSIS — I2510 Atherosclerotic heart disease of native coronary artery without angina pectoris: Secondary | ICD-10-CM | POA: Diagnosis not present

## 2020-04-04 DIAGNOSIS — M4802 Spinal stenosis, cervical region: Secondary | ICD-10-CM | POA: Diagnosis not present

## 2020-04-04 DIAGNOSIS — E11319 Type 2 diabetes mellitus with unspecified diabetic retinopathy without macular edema: Secondary | ICD-10-CM | POA: Diagnosis not present

## 2020-04-04 DIAGNOSIS — D649 Anemia, unspecified: Secondary | ICD-10-CM | POA: Diagnosis not present

## 2020-04-04 DIAGNOSIS — I5040 Unspecified combined systolic (congestive) and diastolic (congestive) heart failure: Secondary | ICD-10-CM | POA: Diagnosis not present

## 2020-04-04 DIAGNOSIS — Z7982 Long term (current) use of aspirin: Secondary | ICD-10-CM | POA: Diagnosis not present

## 2020-04-04 DIAGNOSIS — Z8781 Personal history of (healed) traumatic fracture: Secondary | ICD-10-CM | POA: Diagnosis not present

## 2020-04-04 DIAGNOSIS — Z9181 History of falling: Secondary | ICD-10-CM | POA: Diagnosis not present

## 2020-04-04 DIAGNOSIS — E785 Hyperlipidemia, unspecified: Secondary | ICD-10-CM | POA: Diagnosis not present

## 2020-04-05 NOTE — Telephone Encounter (Signed)
Sent to Dr. [name]. °

## 2020-04-05 NOTE — Telephone Encounter (Signed)
Ok for verbals 

## 2020-04-05 NOTE — Telephone Encounter (Signed)
Message left for verbals.

## 2020-04-06 ENCOUNTER — Other Ambulatory Visit: Payer: Self-pay | Admitting: Internal Medicine

## 2020-04-06 NOTE — Telephone Encounter (Signed)
Done erx 

## 2020-04-08 DIAGNOSIS — M949 Disorder of cartilage, unspecified: Secondary | ICD-10-CM | POA: Diagnosis not present

## 2020-04-08 DIAGNOSIS — Z789 Other specified health status: Secondary | ICD-10-CM | POA: Diagnosis not present

## 2020-04-08 DIAGNOSIS — M810 Age-related osteoporosis without current pathological fracture: Secondary | ICD-10-CM | POA: Diagnosis not present

## 2020-04-08 DIAGNOSIS — M899 Disorder of bone, unspecified: Secondary | ICD-10-CM | POA: Diagnosis not present

## 2020-04-08 DIAGNOSIS — Z7409 Other reduced mobility: Secondary | ICD-10-CM | POA: Diagnosis not present

## 2020-04-11 DIAGNOSIS — Z794 Long term (current) use of insulin: Secondary | ICD-10-CM | POA: Diagnosis not present

## 2020-04-11 DIAGNOSIS — M4316 Spondylolisthesis, lumbar region: Secondary | ICD-10-CM | POA: Diagnosis not present

## 2020-04-11 DIAGNOSIS — Z951 Presence of aortocoronary bypass graft: Secondary | ICD-10-CM | POA: Diagnosis not present

## 2020-04-11 DIAGNOSIS — E785 Hyperlipidemia, unspecified: Secondary | ICD-10-CM | POA: Diagnosis not present

## 2020-04-11 DIAGNOSIS — Z96651 Presence of right artificial knee joint: Secondary | ICD-10-CM | POA: Diagnosis not present

## 2020-04-11 DIAGNOSIS — I2510 Atherosclerotic heart disease of native coronary artery without angina pectoris: Secondary | ICD-10-CM | POA: Diagnosis not present

## 2020-04-11 DIAGNOSIS — M4805 Spinal stenosis, thoracolumbar region: Secondary | ICD-10-CM | POA: Diagnosis not present

## 2020-04-11 DIAGNOSIS — Z7982 Long term (current) use of aspirin: Secondary | ICD-10-CM | POA: Diagnosis not present

## 2020-04-11 DIAGNOSIS — I5040 Unspecified combined systolic (congestive) and diastolic (congestive) heart failure: Secondary | ICD-10-CM | POA: Diagnosis not present

## 2020-04-11 DIAGNOSIS — E11319 Type 2 diabetes mellitus with unspecified diabetic retinopathy without macular edema: Secondary | ICD-10-CM | POA: Diagnosis not present

## 2020-04-11 DIAGNOSIS — M810 Age-related osteoporosis without current pathological fracture: Secondary | ICD-10-CM | POA: Diagnosis not present

## 2020-04-11 DIAGNOSIS — K219 Gastro-esophageal reflux disease without esophagitis: Secondary | ICD-10-CM | POA: Diagnosis not present

## 2020-04-11 DIAGNOSIS — Z8781 Personal history of (healed) traumatic fracture: Secondary | ICD-10-CM | POA: Diagnosis not present

## 2020-04-11 DIAGNOSIS — M4802 Spinal stenosis, cervical region: Secondary | ICD-10-CM | POA: Diagnosis not present

## 2020-04-11 DIAGNOSIS — M1990 Unspecified osteoarthritis, unspecified site: Secondary | ICD-10-CM | POA: Diagnosis not present

## 2020-04-11 DIAGNOSIS — D649 Anemia, unspecified: Secondary | ICD-10-CM | POA: Diagnosis not present

## 2020-04-11 DIAGNOSIS — I110 Hypertensive heart disease with heart failure: Secondary | ICD-10-CM | POA: Diagnosis not present

## 2020-04-11 DIAGNOSIS — J45909 Unspecified asthma, uncomplicated: Secondary | ICD-10-CM | POA: Diagnosis not present

## 2020-04-11 DIAGNOSIS — Z9181 History of falling: Secondary | ICD-10-CM | POA: Diagnosis not present

## 2020-04-12 DIAGNOSIS — J4520 Mild intermittent asthma, uncomplicated: Secondary | ICD-10-CM | POA: Diagnosis not present

## 2020-04-12 DIAGNOSIS — I509 Heart failure, unspecified: Secondary | ICD-10-CM | POA: Diagnosis not present

## 2020-04-14 ENCOUNTER — Other Ambulatory Visit: Payer: Self-pay | Admitting: Internal Medicine

## 2020-04-21 ENCOUNTER — Other Ambulatory Visit: Payer: Self-pay | Admitting: Internal Medicine

## 2020-04-23 ENCOUNTER — Other Ambulatory Visit: Payer: Self-pay | Admitting: Internal Medicine

## 2020-04-23 DIAGNOSIS — I10 Essential (primary) hypertension: Secondary | ICD-10-CM

## 2020-04-23 DIAGNOSIS — E119 Type 2 diabetes mellitus without complications: Secondary | ICD-10-CM | POA: Diagnosis not present

## 2020-04-23 NOTE — Telephone Encounter (Signed)
Please refill as per office routine med refill policy (all routine meds refilled for 3 mo or monthly per pt preference up to one year from last visit, then month to month grace period for 3 mo, then further med refills will have to be denied)  

## 2020-05-05 ENCOUNTER — Other Ambulatory Visit: Payer: Self-pay | Admitting: Internal Medicine

## 2020-05-05 NOTE — Telephone Encounter (Signed)
Please refill as per office routine med refill policy (all routine meds refilled for 3 mo or monthly per pt preference up to one year from last visit, then month to month grace period for 3 mo, then further med refills will have to be denied)  

## 2020-05-08 DIAGNOSIS — M810 Age-related osteoporosis without current pathological fracture: Secondary | ICD-10-CM | POA: Diagnosis not present

## 2020-05-08 DIAGNOSIS — M949 Disorder of cartilage, unspecified: Secondary | ICD-10-CM | POA: Diagnosis not present

## 2020-05-08 DIAGNOSIS — Z789 Other specified health status: Secondary | ICD-10-CM | POA: Diagnosis not present

## 2020-05-08 DIAGNOSIS — M899 Disorder of bone, unspecified: Secondary | ICD-10-CM | POA: Diagnosis not present

## 2020-05-08 DIAGNOSIS — Z7409 Other reduced mobility: Secondary | ICD-10-CM | POA: Diagnosis not present

## 2020-05-09 DIAGNOSIS — Z7982 Long term (current) use of aspirin: Secondary | ICD-10-CM | POA: Diagnosis not present

## 2020-05-09 DIAGNOSIS — D649 Anemia, unspecified: Secondary | ICD-10-CM | POA: Diagnosis not present

## 2020-05-09 DIAGNOSIS — I110 Hypertensive heart disease with heart failure: Secondary | ICD-10-CM | POA: Diagnosis not present

## 2020-05-09 DIAGNOSIS — K219 Gastro-esophageal reflux disease without esophagitis: Secondary | ICD-10-CM | POA: Diagnosis not present

## 2020-05-09 DIAGNOSIS — J45909 Unspecified asthma, uncomplicated: Secondary | ICD-10-CM | POA: Diagnosis not present

## 2020-05-09 DIAGNOSIS — E11319 Type 2 diabetes mellitus with unspecified diabetic retinopathy without macular edema: Secondary | ICD-10-CM | POA: Diagnosis not present

## 2020-05-09 DIAGNOSIS — M4316 Spondylolisthesis, lumbar region: Secondary | ICD-10-CM | POA: Diagnosis not present

## 2020-05-09 DIAGNOSIS — M810 Age-related osteoporosis without current pathological fracture: Secondary | ICD-10-CM | POA: Diagnosis not present

## 2020-05-09 DIAGNOSIS — M4802 Spinal stenosis, cervical region: Secondary | ICD-10-CM | POA: Diagnosis not present

## 2020-05-09 DIAGNOSIS — M1990 Unspecified osteoarthritis, unspecified site: Secondary | ICD-10-CM | POA: Diagnosis not present

## 2020-05-09 DIAGNOSIS — Z8781 Personal history of (healed) traumatic fracture: Secondary | ICD-10-CM | POA: Diagnosis not present

## 2020-05-09 DIAGNOSIS — Z9181 History of falling: Secondary | ICD-10-CM | POA: Diagnosis not present

## 2020-05-09 DIAGNOSIS — I2510 Atherosclerotic heart disease of native coronary artery without angina pectoris: Secondary | ICD-10-CM | POA: Diagnosis not present

## 2020-05-09 DIAGNOSIS — Z96651 Presence of right artificial knee joint: Secondary | ICD-10-CM | POA: Diagnosis not present

## 2020-05-09 DIAGNOSIS — Z951 Presence of aortocoronary bypass graft: Secondary | ICD-10-CM | POA: Diagnosis not present

## 2020-05-09 DIAGNOSIS — Z794 Long term (current) use of insulin: Secondary | ICD-10-CM | POA: Diagnosis not present

## 2020-05-09 DIAGNOSIS — M4805 Spinal stenosis, thoracolumbar region: Secondary | ICD-10-CM | POA: Diagnosis not present

## 2020-05-09 DIAGNOSIS — I5040 Unspecified combined systolic (congestive) and diastolic (congestive) heart failure: Secondary | ICD-10-CM | POA: Diagnosis not present

## 2020-05-09 DIAGNOSIS — E785 Hyperlipidemia, unspecified: Secondary | ICD-10-CM | POA: Diagnosis not present

## 2020-05-12 DIAGNOSIS — I509 Heart failure, unspecified: Secondary | ICD-10-CM | POA: Diagnosis not present

## 2020-05-12 DIAGNOSIS — J4520 Mild intermittent asthma, uncomplicated: Secondary | ICD-10-CM | POA: Diagnosis not present

## 2020-05-16 ENCOUNTER — Encounter: Payer: Self-pay | Admitting: Internal Medicine

## 2020-05-16 ENCOUNTER — Telehealth: Payer: Self-pay

## 2020-05-16 DIAGNOSIS — M4802 Spinal stenosis, cervical region: Secondary | ICD-10-CM | POA: Diagnosis not present

## 2020-05-16 DIAGNOSIS — K219 Gastro-esophageal reflux disease without esophagitis: Secondary | ICD-10-CM | POA: Diagnosis not present

## 2020-05-16 DIAGNOSIS — M4805 Spinal stenosis, thoracolumbar region: Secondary | ICD-10-CM | POA: Diagnosis not present

## 2020-05-16 DIAGNOSIS — Z951 Presence of aortocoronary bypass graft: Secondary | ICD-10-CM | POA: Diagnosis not present

## 2020-05-16 DIAGNOSIS — Z794 Long term (current) use of insulin: Secondary | ICD-10-CM | POA: Diagnosis not present

## 2020-05-16 DIAGNOSIS — J45909 Unspecified asthma, uncomplicated: Secondary | ICD-10-CM | POA: Diagnosis not present

## 2020-05-16 DIAGNOSIS — G5603 Carpal tunnel syndrome, bilateral upper limbs: Secondary | ICD-10-CM | POA: Diagnosis not present

## 2020-05-16 DIAGNOSIS — M4316 Spondylolisthesis, lumbar region: Secondary | ICD-10-CM | POA: Diagnosis not present

## 2020-05-16 DIAGNOSIS — M1990 Unspecified osteoarthritis, unspecified site: Secondary | ICD-10-CM | POA: Diagnosis not present

## 2020-05-16 DIAGNOSIS — D649 Anemia, unspecified: Secondary | ICD-10-CM | POA: Diagnosis not present

## 2020-05-16 DIAGNOSIS — Z7982 Long term (current) use of aspirin: Secondary | ICD-10-CM | POA: Diagnosis not present

## 2020-05-16 DIAGNOSIS — M810 Age-related osteoporosis without current pathological fracture: Secondary | ICD-10-CM | POA: Diagnosis not present

## 2020-05-16 DIAGNOSIS — E785 Hyperlipidemia, unspecified: Secondary | ICD-10-CM | POA: Diagnosis not present

## 2020-05-16 DIAGNOSIS — H409 Unspecified glaucoma: Secondary | ICD-10-CM | POA: Diagnosis not present

## 2020-05-16 DIAGNOSIS — L89892 Pressure ulcer of other site, stage 2: Secondary | ICD-10-CM | POA: Diagnosis not present

## 2020-05-16 DIAGNOSIS — Z96651 Presence of right artificial knee joint: Secondary | ICD-10-CM | POA: Diagnosis not present

## 2020-05-16 DIAGNOSIS — I2510 Atherosclerotic heart disease of native coronary artery without angina pectoris: Secondary | ICD-10-CM | POA: Diagnosis not present

## 2020-05-16 DIAGNOSIS — Z8781 Personal history of (healed) traumatic fracture: Secondary | ICD-10-CM | POA: Diagnosis not present

## 2020-05-16 DIAGNOSIS — I5040 Unspecified combined systolic (congestive) and diastolic (congestive) heart failure: Secondary | ICD-10-CM | POA: Diagnosis not present

## 2020-05-16 DIAGNOSIS — I110 Hypertensive heart disease with heart failure: Secondary | ICD-10-CM | POA: Diagnosis not present

## 2020-05-16 DIAGNOSIS — G43909 Migraine, unspecified, not intractable, without status migrainosus: Secondary | ICD-10-CM | POA: Diagnosis not present

## 2020-05-16 DIAGNOSIS — E11319 Type 2 diabetes mellitus with unspecified diabetic retinopathy without macular edema: Secondary | ICD-10-CM | POA: Diagnosis not present

## 2020-05-16 NOTE — Telephone Encounter (Signed)
Volunteer check in call made for palliative care: Doing ok

## 2020-05-17 DIAGNOSIS — F0391 Unspecified dementia with behavioral disturbance: Secondary | ICD-10-CM

## 2020-05-17 DIAGNOSIS — M4802 Spinal stenosis, cervical region: Secondary | ICD-10-CM | POA: Diagnosis not present

## 2020-05-17 DIAGNOSIS — I2510 Atherosclerotic heart disease of native coronary artery without angina pectoris: Secondary | ICD-10-CM | POA: Diagnosis not present

## 2020-05-17 DIAGNOSIS — Z9181 History of falling: Secondary | ICD-10-CM

## 2020-05-17 DIAGNOSIS — G5603 Carpal tunnel syndrome, bilateral upper limbs: Secondary | ICD-10-CM | POA: Diagnosis not present

## 2020-05-17 DIAGNOSIS — M4316 Spondylolisthesis, lumbar region: Secondary | ICD-10-CM | POA: Diagnosis not present

## 2020-05-17 DIAGNOSIS — Z8781 Personal history of (healed) traumatic fracture: Secondary | ICD-10-CM | POA: Diagnosis not present

## 2020-05-17 DIAGNOSIS — D649 Anemia, unspecified: Secondary | ICD-10-CM | POA: Diagnosis not present

## 2020-05-17 DIAGNOSIS — E11319 Type 2 diabetes mellitus with unspecified diabetic retinopathy without macular edema: Secondary | ICD-10-CM | POA: Diagnosis not present

## 2020-05-17 DIAGNOSIS — Z794 Long term (current) use of insulin: Secondary | ICD-10-CM | POA: Diagnosis not present

## 2020-05-17 DIAGNOSIS — E785 Hyperlipidemia, unspecified: Secondary | ICD-10-CM | POA: Diagnosis not present

## 2020-05-17 DIAGNOSIS — Z87442 Personal history of urinary calculi: Secondary | ICD-10-CM

## 2020-05-17 DIAGNOSIS — M4805 Spinal stenosis, thoracolumbar region: Secondary | ICD-10-CM | POA: Diagnosis not present

## 2020-05-17 DIAGNOSIS — K219 Gastro-esophageal reflux disease without esophagitis: Secondary | ICD-10-CM | POA: Diagnosis not present

## 2020-05-17 DIAGNOSIS — Z7982 Long term (current) use of aspirin: Secondary | ICD-10-CM | POA: Diagnosis not present

## 2020-05-17 DIAGNOSIS — I5040 Unspecified combined systolic (congestive) and diastolic (congestive) heart failure: Secondary | ICD-10-CM | POA: Diagnosis not present

## 2020-05-17 DIAGNOSIS — J45909 Unspecified asthma, uncomplicated: Secondary | ICD-10-CM | POA: Diagnosis not present

## 2020-05-17 DIAGNOSIS — I110 Hypertensive heart disease with heart failure: Secondary | ICD-10-CM | POA: Diagnosis not present

## 2020-05-17 DIAGNOSIS — M1990 Unspecified osteoarthritis, unspecified site: Secondary | ICD-10-CM | POA: Diagnosis not present

## 2020-05-17 DIAGNOSIS — Z96651 Presence of right artificial knee joint: Secondary | ICD-10-CM | POA: Diagnosis not present

## 2020-05-17 DIAGNOSIS — M810 Age-related osteoporosis without current pathological fracture: Secondary | ICD-10-CM | POA: Diagnosis not present

## 2020-05-17 DIAGNOSIS — L89892 Pressure ulcer of other site, stage 2: Secondary | ICD-10-CM | POA: Diagnosis not present

## 2020-05-17 DIAGNOSIS — G43909 Migraine, unspecified, not intractable, without status migrainosus: Secondary | ICD-10-CM | POA: Diagnosis not present

## 2020-05-17 DIAGNOSIS — F329 Major depressive disorder, single episode, unspecified: Secondary | ICD-10-CM

## 2020-05-17 DIAGNOSIS — H409 Unspecified glaucoma: Secondary | ICD-10-CM | POA: Diagnosis not present

## 2020-05-17 DIAGNOSIS — Z951 Presence of aortocoronary bypass graft: Secondary | ICD-10-CM | POA: Diagnosis not present

## 2020-05-19 ENCOUNTER — Other Ambulatory Visit: Payer: Self-pay | Admitting: Internal Medicine

## 2020-05-20 ENCOUNTER — Telehealth: Payer: Self-pay

## 2020-05-20 DIAGNOSIS — M4802 Spinal stenosis, cervical region: Secondary | ICD-10-CM | POA: Diagnosis not present

## 2020-05-20 DIAGNOSIS — J45909 Unspecified asthma, uncomplicated: Secondary | ICD-10-CM | POA: Diagnosis not present

## 2020-05-20 DIAGNOSIS — M4316 Spondylolisthesis, lumbar region: Secondary | ICD-10-CM | POA: Diagnosis not present

## 2020-05-20 DIAGNOSIS — I2510 Atherosclerotic heart disease of native coronary artery without angina pectoris: Secondary | ICD-10-CM | POA: Diagnosis not present

## 2020-05-20 DIAGNOSIS — D649 Anemia, unspecified: Secondary | ICD-10-CM | POA: Diagnosis not present

## 2020-05-20 DIAGNOSIS — M1990 Unspecified osteoarthritis, unspecified site: Secondary | ICD-10-CM | POA: Diagnosis not present

## 2020-05-20 DIAGNOSIS — M4805 Spinal stenosis, thoracolumbar region: Secondary | ICD-10-CM | POA: Diagnosis not present

## 2020-05-20 DIAGNOSIS — I110 Hypertensive heart disease with heart failure: Secondary | ICD-10-CM | POA: Diagnosis not present

## 2020-05-20 DIAGNOSIS — G5603 Carpal tunnel syndrome, bilateral upper limbs: Secondary | ICD-10-CM | POA: Diagnosis not present

## 2020-05-20 DIAGNOSIS — K219 Gastro-esophageal reflux disease without esophagitis: Secondary | ICD-10-CM | POA: Diagnosis not present

## 2020-05-20 DIAGNOSIS — I5040 Unspecified combined systolic (congestive) and diastolic (congestive) heart failure: Secondary | ICD-10-CM | POA: Diagnosis not present

## 2020-05-20 DIAGNOSIS — G43909 Migraine, unspecified, not intractable, without status migrainosus: Secondary | ICD-10-CM | POA: Diagnosis not present

## 2020-05-20 DIAGNOSIS — Z96651 Presence of right artificial knee joint: Secondary | ICD-10-CM | POA: Diagnosis not present

## 2020-05-20 DIAGNOSIS — E11319 Type 2 diabetes mellitus with unspecified diabetic retinopathy without macular edema: Secondary | ICD-10-CM | POA: Diagnosis not present

## 2020-05-20 DIAGNOSIS — H409 Unspecified glaucoma: Secondary | ICD-10-CM | POA: Diagnosis not present

## 2020-05-20 DIAGNOSIS — Z8781 Personal history of (healed) traumatic fracture: Secondary | ICD-10-CM | POA: Diagnosis not present

## 2020-05-20 DIAGNOSIS — M810 Age-related osteoporosis without current pathological fracture: Secondary | ICD-10-CM | POA: Diagnosis not present

## 2020-05-20 DIAGNOSIS — Z951 Presence of aortocoronary bypass graft: Secondary | ICD-10-CM | POA: Diagnosis not present

## 2020-05-20 DIAGNOSIS — E785 Hyperlipidemia, unspecified: Secondary | ICD-10-CM | POA: Diagnosis not present

## 2020-05-20 DIAGNOSIS — Z7982 Long term (current) use of aspirin: Secondary | ICD-10-CM | POA: Diagnosis not present

## 2020-05-20 DIAGNOSIS — L89892 Pressure ulcer of other site, stage 2: Secondary | ICD-10-CM | POA: Diagnosis not present

## 2020-05-20 DIAGNOSIS — Z794 Long term (current) use of insulin: Secondary | ICD-10-CM | POA: Diagnosis not present

## 2020-05-20 NOTE — Telephone Encounter (Signed)
Sent message for Dr. Jenny Reichmann to advise as Vicente Males a home health nurse has called and stated pts daughter has fallen on yesterday, EMS was called to help with picking pt up from the floor. Pts granddaughter now states is in a lot pain and is unable to sit up due to the pain.  I have advised the Home health nurse for pt to please to go to ER ASAP.

## 2020-05-20 NOTE — Telephone Encounter (Signed)
Agree, as we are not able to evaluate this in our office

## 2020-05-21 ENCOUNTER — Telehealth: Payer: Self-pay | Admitting: Internal Medicine

## 2020-05-21 DIAGNOSIS — L89892 Pressure ulcer of other site, stage 2: Secondary | ICD-10-CM | POA: Diagnosis not present

## 2020-05-21 NOTE — Telephone Encounter (Signed)
Tillie Rung with [facility] is requesting verbal orders for a hospital bed and also home health physical therapy for 2w4. Tillie Rung said that the patient's home is not a good fit for home health physical therapy, but they are doing the best they can .     Phone : [phone]

## 2020-05-22 NOTE — Telephone Encounter (Signed)
Ok for verbals for all 

## 2020-05-22 NOTE — Telephone Encounter (Signed)
Spoke with Holly [facility] of [facility] and was able to give verbal orders for a hospital bed and also home health physical therapy for 2w4. Holly Weiss said that the patient's home is not a good fit for home health physical therapy.

## 2020-05-22 NOTE — Telephone Encounter (Signed)
Sent to dr. Jenny Reichmann.

## 2020-05-23 DIAGNOSIS — G43909 Migraine, unspecified, not intractable, without status migrainosus: Secondary | ICD-10-CM | POA: Diagnosis not present

## 2020-05-23 DIAGNOSIS — J45909 Unspecified asthma, uncomplicated: Secondary | ICD-10-CM | POA: Diagnosis not present

## 2020-05-23 DIAGNOSIS — I110 Hypertensive heart disease with heart failure: Secondary | ICD-10-CM | POA: Diagnosis not present

## 2020-05-23 DIAGNOSIS — D649 Anemia, unspecified: Secondary | ICD-10-CM | POA: Diagnosis not present

## 2020-05-23 DIAGNOSIS — G5603 Carpal tunnel syndrome, bilateral upper limbs: Secondary | ICD-10-CM | POA: Diagnosis not present

## 2020-05-23 DIAGNOSIS — E11319 Type 2 diabetes mellitus with unspecified diabetic retinopathy without macular edema: Secondary | ICD-10-CM | POA: Diagnosis not present

## 2020-05-23 DIAGNOSIS — Z8781 Personal history of (healed) traumatic fracture: Secondary | ICD-10-CM | POA: Diagnosis not present

## 2020-05-23 DIAGNOSIS — Z7982 Long term (current) use of aspirin: Secondary | ICD-10-CM | POA: Diagnosis not present

## 2020-05-23 DIAGNOSIS — H409 Unspecified glaucoma: Secondary | ICD-10-CM | POA: Diagnosis not present

## 2020-05-23 DIAGNOSIS — Z96651 Presence of right artificial knee joint: Secondary | ICD-10-CM | POA: Diagnosis not present

## 2020-05-23 DIAGNOSIS — M810 Age-related osteoporosis without current pathological fracture: Secondary | ICD-10-CM | POA: Diagnosis not present

## 2020-05-23 DIAGNOSIS — K219 Gastro-esophageal reflux disease without esophagitis: Secondary | ICD-10-CM | POA: Diagnosis not present

## 2020-05-23 DIAGNOSIS — I5040 Unspecified combined systolic (congestive) and diastolic (congestive) heart failure: Secondary | ICD-10-CM | POA: Diagnosis not present

## 2020-05-23 DIAGNOSIS — M4316 Spondylolisthesis, lumbar region: Secondary | ICD-10-CM | POA: Diagnosis not present

## 2020-05-23 DIAGNOSIS — Z794 Long term (current) use of insulin: Secondary | ICD-10-CM | POA: Diagnosis not present

## 2020-05-23 DIAGNOSIS — E785 Hyperlipidemia, unspecified: Secondary | ICD-10-CM | POA: Diagnosis not present

## 2020-05-23 DIAGNOSIS — M1990 Unspecified osteoarthritis, unspecified site: Secondary | ICD-10-CM | POA: Diagnosis not present

## 2020-05-23 DIAGNOSIS — L89892 Pressure ulcer of other site, stage 2: Secondary | ICD-10-CM | POA: Diagnosis not present

## 2020-05-23 DIAGNOSIS — M4805 Spinal stenosis, thoracolumbar region: Secondary | ICD-10-CM | POA: Diagnosis not present

## 2020-05-23 DIAGNOSIS — Z951 Presence of aortocoronary bypass graft: Secondary | ICD-10-CM | POA: Diagnosis not present

## 2020-05-23 DIAGNOSIS — M4802 Spinal stenosis, cervical region: Secondary | ICD-10-CM | POA: Diagnosis not present

## 2020-05-23 DIAGNOSIS — I2510 Atherosclerotic heart disease of native coronary artery without angina pectoris: Secondary | ICD-10-CM | POA: Diagnosis not present

## 2020-05-29 DIAGNOSIS — Z7982 Long term (current) use of aspirin: Secondary | ICD-10-CM | POA: Diagnosis not present

## 2020-05-29 DIAGNOSIS — Z951 Presence of aortocoronary bypass graft: Secondary | ICD-10-CM | POA: Diagnosis not present

## 2020-05-29 DIAGNOSIS — M4316 Spondylolisthesis, lumbar region: Secondary | ICD-10-CM | POA: Diagnosis not present

## 2020-05-29 DIAGNOSIS — G43909 Migraine, unspecified, not intractable, without status migrainosus: Secondary | ICD-10-CM | POA: Diagnosis not present

## 2020-05-29 DIAGNOSIS — G5603 Carpal tunnel syndrome, bilateral upper limbs: Secondary | ICD-10-CM | POA: Diagnosis not present

## 2020-05-29 DIAGNOSIS — E11319 Type 2 diabetes mellitus with unspecified diabetic retinopathy without macular edema: Secondary | ICD-10-CM | POA: Diagnosis not present

## 2020-05-29 DIAGNOSIS — I5040 Unspecified combined systolic (congestive) and diastolic (congestive) heart failure: Secondary | ICD-10-CM | POA: Diagnosis not present

## 2020-05-29 DIAGNOSIS — Z8781 Personal history of (healed) traumatic fracture: Secondary | ICD-10-CM | POA: Diagnosis not present

## 2020-05-29 DIAGNOSIS — M1990 Unspecified osteoarthritis, unspecified site: Secondary | ICD-10-CM | POA: Diagnosis not present

## 2020-05-29 DIAGNOSIS — Z96651 Presence of right artificial knee joint: Secondary | ICD-10-CM | POA: Diagnosis not present

## 2020-05-29 DIAGNOSIS — L89892 Pressure ulcer of other site, stage 2: Secondary | ICD-10-CM | POA: Diagnosis not present

## 2020-05-29 DIAGNOSIS — M810 Age-related osteoporosis without current pathological fracture: Secondary | ICD-10-CM | POA: Diagnosis not present

## 2020-05-29 DIAGNOSIS — M4805 Spinal stenosis, thoracolumbar region: Secondary | ICD-10-CM | POA: Diagnosis not present

## 2020-05-29 DIAGNOSIS — K219 Gastro-esophageal reflux disease without esophagitis: Secondary | ICD-10-CM | POA: Diagnosis not present

## 2020-05-29 DIAGNOSIS — Z794 Long term (current) use of insulin: Secondary | ICD-10-CM | POA: Diagnosis not present

## 2020-05-29 DIAGNOSIS — I2510 Atherosclerotic heart disease of native coronary artery without angina pectoris: Secondary | ICD-10-CM | POA: Diagnosis not present

## 2020-05-29 DIAGNOSIS — H409 Unspecified glaucoma: Secondary | ICD-10-CM | POA: Diagnosis not present

## 2020-05-29 DIAGNOSIS — J45909 Unspecified asthma, uncomplicated: Secondary | ICD-10-CM | POA: Diagnosis not present

## 2020-05-29 DIAGNOSIS — I110 Hypertensive heart disease with heart failure: Secondary | ICD-10-CM | POA: Diagnosis not present

## 2020-05-29 DIAGNOSIS — D649 Anemia, unspecified: Secondary | ICD-10-CM | POA: Diagnosis not present

## 2020-05-29 DIAGNOSIS — E785 Hyperlipidemia, unspecified: Secondary | ICD-10-CM | POA: Diagnosis not present

## 2020-05-29 DIAGNOSIS — M4802 Spinal stenosis, cervical region: Secondary | ICD-10-CM | POA: Diagnosis not present

## 2020-05-30 DIAGNOSIS — L89892 Pressure ulcer of other site, stage 2: Secondary | ICD-10-CM | POA: Diagnosis not present

## 2020-05-30 DIAGNOSIS — Z951 Presence of aortocoronary bypass graft: Secondary | ICD-10-CM | POA: Diagnosis not present

## 2020-05-30 DIAGNOSIS — M4805 Spinal stenosis, thoracolumbar region: Secondary | ICD-10-CM | POA: Diagnosis not present

## 2020-05-30 DIAGNOSIS — Z8781 Personal history of (healed) traumatic fracture: Secondary | ICD-10-CM | POA: Diagnosis not present

## 2020-05-30 DIAGNOSIS — E11319 Type 2 diabetes mellitus with unspecified diabetic retinopathy without macular edema: Secondary | ICD-10-CM | POA: Diagnosis not present

## 2020-05-30 DIAGNOSIS — H409 Unspecified glaucoma: Secondary | ICD-10-CM | POA: Diagnosis not present

## 2020-05-30 DIAGNOSIS — M810 Age-related osteoporosis without current pathological fracture: Secondary | ICD-10-CM | POA: Diagnosis not present

## 2020-05-30 DIAGNOSIS — M4802 Spinal stenosis, cervical region: Secondary | ICD-10-CM | POA: Diagnosis not present

## 2020-05-30 DIAGNOSIS — G5603 Carpal tunnel syndrome, bilateral upper limbs: Secondary | ICD-10-CM | POA: Diagnosis not present

## 2020-05-30 DIAGNOSIS — E785 Hyperlipidemia, unspecified: Secondary | ICD-10-CM | POA: Diagnosis not present

## 2020-05-30 DIAGNOSIS — M1990 Unspecified osteoarthritis, unspecified site: Secondary | ICD-10-CM | POA: Diagnosis not present

## 2020-05-30 DIAGNOSIS — Z96651 Presence of right artificial knee joint: Secondary | ICD-10-CM | POA: Diagnosis not present

## 2020-05-30 DIAGNOSIS — I110 Hypertensive heart disease with heart failure: Secondary | ICD-10-CM | POA: Diagnosis not present

## 2020-05-30 DIAGNOSIS — I2510 Atherosclerotic heart disease of native coronary artery without angina pectoris: Secondary | ICD-10-CM | POA: Diagnosis not present

## 2020-05-30 DIAGNOSIS — I5040 Unspecified combined systolic (congestive) and diastolic (congestive) heart failure: Secondary | ICD-10-CM | POA: Diagnosis not present

## 2020-05-30 DIAGNOSIS — Z794 Long term (current) use of insulin: Secondary | ICD-10-CM | POA: Diagnosis not present

## 2020-05-30 DIAGNOSIS — M4316 Spondylolisthesis, lumbar region: Secondary | ICD-10-CM | POA: Diagnosis not present

## 2020-05-30 DIAGNOSIS — J45909 Unspecified asthma, uncomplicated: Secondary | ICD-10-CM | POA: Diagnosis not present

## 2020-05-30 DIAGNOSIS — Z7982 Long term (current) use of aspirin: Secondary | ICD-10-CM | POA: Diagnosis not present

## 2020-05-30 DIAGNOSIS — D649 Anemia, unspecified: Secondary | ICD-10-CM | POA: Diagnosis not present

## 2020-05-30 DIAGNOSIS — G43909 Migraine, unspecified, not intractable, without status migrainosus: Secondary | ICD-10-CM | POA: Diagnosis not present

## 2020-05-30 DIAGNOSIS — K219 Gastro-esophageal reflux disease without esophagitis: Secondary | ICD-10-CM | POA: Diagnosis not present

## 2020-06-02 ENCOUNTER — Encounter: Payer: Self-pay | Admitting: Internal Medicine

## 2020-06-02 DIAGNOSIS — F0390 Unspecified dementia without behavioral disturbance: Secondary | ICD-10-CM | POA: Insufficient documentation

## 2020-06-02 DIAGNOSIS — G301 Alzheimer's disease with late onset: Secondary | ICD-10-CM

## 2020-06-04 DIAGNOSIS — Z8781 Personal history of (healed) traumatic fracture: Secondary | ICD-10-CM | POA: Diagnosis not present

## 2020-06-04 DIAGNOSIS — M4805 Spinal stenosis, thoracolumbar region: Secondary | ICD-10-CM | POA: Diagnosis not present

## 2020-06-04 DIAGNOSIS — M1990 Unspecified osteoarthritis, unspecified site: Secondary | ICD-10-CM | POA: Diagnosis not present

## 2020-06-04 DIAGNOSIS — Z794 Long term (current) use of insulin: Secondary | ICD-10-CM | POA: Diagnosis not present

## 2020-06-04 DIAGNOSIS — L89892 Pressure ulcer of other site, stage 2: Secondary | ICD-10-CM | POA: Diagnosis not present

## 2020-06-04 DIAGNOSIS — Z96651 Presence of right artificial knee joint: Secondary | ICD-10-CM | POA: Diagnosis not present

## 2020-06-04 DIAGNOSIS — J45909 Unspecified asthma, uncomplicated: Secondary | ICD-10-CM | POA: Diagnosis not present

## 2020-06-04 DIAGNOSIS — E785 Hyperlipidemia, unspecified: Secondary | ICD-10-CM | POA: Diagnosis not present

## 2020-06-04 DIAGNOSIS — D649 Anemia, unspecified: Secondary | ICD-10-CM | POA: Diagnosis not present

## 2020-06-04 DIAGNOSIS — K219 Gastro-esophageal reflux disease without esophagitis: Secondary | ICD-10-CM | POA: Diagnosis not present

## 2020-06-04 DIAGNOSIS — G43909 Migraine, unspecified, not intractable, without status migrainosus: Secondary | ICD-10-CM | POA: Diagnosis not present

## 2020-06-04 DIAGNOSIS — E11319 Type 2 diabetes mellitus with unspecified diabetic retinopathy without macular edema: Secondary | ICD-10-CM | POA: Diagnosis not present

## 2020-06-04 DIAGNOSIS — M4802 Spinal stenosis, cervical region: Secondary | ICD-10-CM | POA: Diagnosis not present

## 2020-06-04 DIAGNOSIS — Z951 Presence of aortocoronary bypass graft: Secondary | ICD-10-CM | POA: Diagnosis not present

## 2020-06-04 DIAGNOSIS — M4316 Spondylolisthesis, lumbar region: Secondary | ICD-10-CM | POA: Diagnosis not present

## 2020-06-04 DIAGNOSIS — G5603 Carpal tunnel syndrome, bilateral upper limbs: Secondary | ICD-10-CM | POA: Diagnosis not present

## 2020-06-04 DIAGNOSIS — I2510 Atherosclerotic heart disease of native coronary artery without angina pectoris: Secondary | ICD-10-CM | POA: Diagnosis not present

## 2020-06-04 DIAGNOSIS — I5040 Unspecified combined systolic (congestive) and diastolic (congestive) heart failure: Secondary | ICD-10-CM | POA: Diagnosis not present

## 2020-06-04 DIAGNOSIS — Z7982 Long term (current) use of aspirin: Secondary | ICD-10-CM | POA: Diagnosis not present

## 2020-06-04 DIAGNOSIS — H409 Unspecified glaucoma: Secondary | ICD-10-CM | POA: Diagnosis not present

## 2020-06-04 DIAGNOSIS — M810 Age-related osteoporosis without current pathological fracture: Secondary | ICD-10-CM | POA: Diagnosis not present

## 2020-06-04 DIAGNOSIS — I110 Hypertensive heart disease with heart failure: Secondary | ICD-10-CM | POA: Diagnosis not present

## 2020-06-08 DIAGNOSIS — M899 Disorder of bone, unspecified: Secondary | ICD-10-CM | POA: Diagnosis not present

## 2020-06-08 DIAGNOSIS — Z7409 Other reduced mobility: Secondary | ICD-10-CM | POA: Diagnosis not present

## 2020-06-08 DIAGNOSIS — M810 Age-related osteoporosis without current pathological fracture: Secondary | ICD-10-CM | POA: Diagnosis not present

## 2020-06-08 DIAGNOSIS — M949 Disorder of cartilage, unspecified: Secondary | ICD-10-CM | POA: Diagnosis not present

## 2020-06-08 DIAGNOSIS — Z789 Other specified health status: Secondary | ICD-10-CM | POA: Diagnosis not present

## 2020-06-12 DIAGNOSIS — I509 Heart failure, unspecified: Secondary | ICD-10-CM | POA: Diagnosis not present

## 2020-06-12 DIAGNOSIS — J4520 Mild intermittent asthma, uncomplicated: Secondary | ICD-10-CM | POA: Diagnosis not present

## 2020-06-13 DIAGNOSIS — I2510 Atherosclerotic heart disease of native coronary artery without angina pectoris: Secondary | ICD-10-CM | POA: Diagnosis not present

## 2020-06-13 DIAGNOSIS — J45909 Unspecified asthma, uncomplicated: Secondary | ICD-10-CM | POA: Diagnosis not present

## 2020-06-13 DIAGNOSIS — L89892 Pressure ulcer of other site, stage 2: Secondary | ICD-10-CM | POA: Diagnosis not present

## 2020-06-13 DIAGNOSIS — M4805 Spinal stenosis, thoracolumbar region: Secondary | ICD-10-CM | POA: Diagnosis not present

## 2020-06-13 DIAGNOSIS — G43909 Migraine, unspecified, not intractable, without status migrainosus: Secondary | ICD-10-CM | POA: Diagnosis not present

## 2020-06-13 DIAGNOSIS — E785 Hyperlipidemia, unspecified: Secondary | ICD-10-CM | POA: Diagnosis not present

## 2020-06-13 DIAGNOSIS — M810 Age-related osteoporosis without current pathological fracture: Secondary | ICD-10-CM | POA: Diagnosis not present

## 2020-06-13 DIAGNOSIS — K219 Gastro-esophageal reflux disease without esophagitis: Secondary | ICD-10-CM | POA: Diagnosis not present

## 2020-06-13 DIAGNOSIS — M1990 Unspecified osteoarthritis, unspecified site: Secondary | ICD-10-CM | POA: Diagnosis not present

## 2020-06-13 DIAGNOSIS — D649 Anemia, unspecified: Secondary | ICD-10-CM | POA: Diagnosis not present

## 2020-06-13 DIAGNOSIS — Z96651 Presence of right artificial knee joint: Secondary | ICD-10-CM | POA: Diagnosis not present

## 2020-06-13 DIAGNOSIS — Z951 Presence of aortocoronary bypass graft: Secondary | ICD-10-CM | POA: Diagnosis not present

## 2020-06-13 DIAGNOSIS — Z794 Long term (current) use of insulin: Secondary | ICD-10-CM | POA: Diagnosis not present

## 2020-06-13 DIAGNOSIS — Z8781 Personal history of (healed) traumatic fracture: Secondary | ICD-10-CM | POA: Diagnosis not present

## 2020-06-13 DIAGNOSIS — I5040 Unspecified combined systolic (congestive) and diastolic (congestive) heart failure: Secondary | ICD-10-CM | POA: Diagnosis not present

## 2020-06-13 DIAGNOSIS — E11319 Type 2 diabetes mellitus with unspecified diabetic retinopathy without macular edema: Secondary | ICD-10-CM | POA: Diagnosis not present

## 2020-06-13 DIAGNOSIS — M4802 Spinal stenosis, cervical region: Secondary | ICD-10-CM | POA: Diagnosis not present

## 2020-06-13 DIAGNOSIS — M4316 Spondylolisthesis, lumbar region: Secondary | ICD-10-CM | POA: Diagnosis not present

## 2020-06-13 DIAGNOSIS — H409 Unspecified glaucoma: Secondary | ICD-10-CM | POA: Diagnosis not present

## 2020-06-13 DIAGNOSIS — G5603 Carpal tunnel syndrome, bilateral upper limbs: Secondary | ICD-10-CM | POA: Diagnosis not present

## 2020-06-13 DIAGNOSIS — I110 Hypertensive heart disease with heart failure: Secondary | ICD-10-CM | POA: Diagnosis not present

## 2020-06-13 DIAGNOSIS — Z7982 Long term (current) use of aspirin: Secondary | ICD-10-CM | POA: Diagnosis not present

## 2020-06-19 ENCOUNTER — Telehealth: Payer: Self-pay | Admitting: Internal Medicine

## 2020-06-19 DIAGNOSIS — D649 Anemia, unspecified: Secondary | ICD-10-CM | POA: Diagnosis not present

## 2020-06-19 DIAGNOSIS — I110 Hypertensive heart disease with heart failure: Secondary | ICD-10-CM | POA: Diagnosis not present

## 2020-06-19 DIAGNOSIS — L89892 Pressure ulcer of other site, stage 2: Secondary | ICD-10-CM | POA: Diagnosis not present

## 2020-06-19 DIAGNOSIS — Z8781 Personal history of (healed) traumatic fracture: Secondary | ICD-10-CM | POA: Diagnosis not present

## 2020-06-19 DIAGNOSIS — Z96651 Presence of right artificial knee joint: Secondary | ICD-10-CM | POA: Diagnosis not present

## 2020-06-19 DIAGNOSIS — M4802 Spinal stenosis, cervical region: Secondary | ICD-10-CM | POA: Diagnosis not present

## 2020-06-19 DIAGNOSIS — J45909 Unspecified asthma, uncomplicated: Secondary | ICD-10-CM | POA: Diagnosis not present

## 2020-06-19 DIAGNOSIS — K219 Gastro-esophageal reflux disease without esophagitis: Secondary | ICD-10-CM | POA: Diagnosis not present

## 2020-06-19 DIAGNOSIS — M4316 Spondylolisthesis, lumbar region: Secondary | ICD-10-CM | POA: Diagnosis not present

## 2020-06-19 DIAGNOSIS — I5040 Unspecified combined systolic (congestive) and diastolic (congestive) heart failure: Secondary | ICD-10-CM | POA: Diagnosis not present

## 2020-06-19 DIAGNOSIS — M810 Age-related osteoporosis without current pathological fracture: Secondary | ICD-10-CM | POA: Diagnosis not present

## 2020-06-19 DIAGNOSIS — G43909 Migraine, unspecified, not intractable, without status migrainosus: Secondary | ICD-10-CM | POA: Diagnosis not present

## 2020-06-19 DIAGNOSIS — E785 Hyperlipidemia, unspecified: Secondary | ICD-10-CM | POA: Diagnosis not present

## 2020-06-19 DIAGNOSIS — M4805 Spinal stenosis, thoracolumbar region: Secondary | ICD-10-CM | POA: Diagnosis not present

## 2020-06-19 DIAGNOSIS — Z7982 Long term (current) use of aspirin: Secondary | ICD-10-CM | POA: Diagnosis not present

## 2020-06-19 DIAGNOSIS — Z951 Presence of aortocoronary bypass graft: Secondary | ICD-10-CM | POA: Diagnosis not present

## 2020-06-19 DIAGNOSIS — Z794 Long term (current) use of insulin: Secondary | ICD-10-CM | POA: Diagnosis not present

## 2020-06-19 DIAGNOSIS — G5603 Carpal tunnel syndrome, bilateral upper limbs: Secondary | ICD-10-CM | POA: Diagnosis not present

## 2020-06-19 DIAGNOSIS — M1990 Unspecified osteoarthritis, unspecified site: Secondary | ICD-10-CM | POA: Diagnosis not present

## 2020-06-19 DIAGNOSIS — H409 Unspecified glaucoma: Secondary | ICD-10-CM | POA: Diagnosis not present

## 2020-06-19 DIAGNOSIS — E11319 Type 2 diabetes mellitus with unspecified diabetic retinopathy without macular edema: Secondary | ICD-10-CM | POA: Diagnosis not present

## 2020-06-19 DIAGNOSIS — I2510 Atherosclerotic heart disease of native coronary artery without angina pectoris: Secondary | ICD-10-CM | POA: Diagnosis not present

## 2020-06-19 NOTE — Telephone Encounter (Signed)
Sent to Dr. [name]. 

## 2020-06-19 NOTE — Telephone Encounter (Signed)
error 

## 2020-06-19 NOTE — Telephone Encounter (Signed)
Holly Weiss from [facility] called [phone]  Patient's vitals were taken today  BP: 100/60 Pulse rate: 50   Patient was nostalgic when she arrived. Holly Weiss advised the patient's daughter to give her some fluids and the daughter was making her a boiled egg with some salt on it.   Holly Weiss would like a call back please

## 2020-06-19 NOTE — Telephone Encounter (Signed)
   Tillie Rung a physical therapist from [facility] calling to let us know that the patients family is interested in short term rehab for strengthening and if the provider could complete the [facility] for this.  Please call Tillie Rung with any questions about this.  [phone] Okay to LVM

## 2020-06-19 NOTE — Telephone Encounter (Signed)
Ok to continue current meds as long as she has not changed and worse with fatigue, dizziness, weakness, or falling

## 2020-06-20 ENCOUNTER — Telehealth: Payer: Self-pay | Admitting: Internal Medicine

## 2020-06-20 NOTE — Telephone Encounter (Signed)
Ok but I dont have any fl2 for this

## 2020-06-20 NOTE — Telephone Encounter (Signed)
No need since she is over 79yo, she is automatically excused; please have the daughter read the summons carefully

## 2020-06-20 NOTE — Telephone Encounter (Signed)
Sent to Dr. [name] to advise. 

## 2020-06-20 NOTE — Telephone Encounter (Signed)
   Daughter calling to request letter to provide [facility]. Patient has a jury duty summons for 08/14/20 Juror number [id number]  Requesting letter to permanently excuse her from jury duty due to being disabled Daughter [name] can be reached at [phone]

## 2020-06-20 NOTE — Telephone Encounter (Signed)
Sent to Dr. [name]. 

## 2020-06-20 NOTE — Telephone Encounter (Signed)
LDVM with Almyra Free of Dr. Gwynn Burly advice.

## 2020-06-24 NOTE — Telephone Encounter (Signed)
Emailed FL2 form received from [place] for PCP completion & signature. Form on Dr Gwynn Burly desk at this time.

## 2020-06-24 NOTE — Telephone Encounter (Signed)
Tillie Rung states she has faxed FL2 twice (up to 3weeks ago).  Asked if she would fax it again today & I would be sure Dr Jenny Reichmann got it.  Tillie Rung states she will email it to [place].

## 2020-06-25 DIAGNOSIS — Z8781 Personal history of (healed) traumatic fracture: Secondary | ICD-10-CM | POA: Diagnosis not present

## 2020-06-25 DIAGNOSIS — I2510 Atherosclerotic heart disease of native coronary artery without angina pectoris: Secondary | ICD-10-CM | POA: Diagnosis not present

## 2020-06-25 DIAGNOSIS — M1990 Unspecified osteoarthritis, unspecified site: Secondary | ICD-10-CM | POA: Diagnosis not present

## 2020-06-25 DIAGNOSIS — H409 Unspecified glaucoma: Secondary | ICD-10-CM | POA: Diagnosis not present

## 2020-06-25 DIAGNOSIS — L89892 Pressure ulcer of other site, stage 2: Secondary | ICD-10-CM | POA: Diagnosis not present

## 2020-06-25 DIAGNOSIS — E785 Hyperlipidemia, unspecified: Secondary | ICD-10-CM | POA: Diagnosis not present

## 2020-06-25 DIAGNOSIS — D649 Anemia, unspecified: Secondary | ICD-10-CM | POA: Diagnosis not present

## 2020-06-25 DIAGNOSIS — Z794 Long term (current) use of insulin: Secondary | ICD-10-CM | POA: Diagnosis not present

## 2020-06-25 DIAGNOSIS — E11319 Type 2 diabetes mellitus with unspecified diabetic retinopathy without macular edema: Secondary | ICD-10-CM | POA: Diagnosis not present

## 2020-06-25 DIAGNOSIS — Z7982 Long term (current) use of aspirin: Secondary | ICD-10-CM | POA: Diagnosis not present

## 2020-06-25 DIAGNOSIS — M4316 Spondylolisthesis, lumbar region: Secondary | ICD-10-CM | POA: Diagnosis not present

## 2020-06-25 DIAGNOSIS — Z96651 Presence of right artificial knee joint: Secondary | ICD-10-CM | POA: Diagnosis not present

## 2020-06-25 DIAGNOSIS — Z951 Presence of aortocoronary bypass graft: Secondary | ICD-10-CM | POA: Diagnosis not present

## 2020-06-25 DIAGNOSIS — I110 Hypertensive heart disease with heart failure: Secondary | ICD-10-CM | POA: Diagnosis not present

## 2020-06-25 DIAGNOSIS — G43909 Migraine, unspecified, not intractable, without status migrainosus: Secondary | ICD-10-CM | POA: Diagnosis not present

## 2020-06-25 DIAGNOSIS — J45909 Unspecified asthma, uncomplicated: Secondary | ICD-10-CM | POA: Diagnosis not present

## 2020-06-25 DIAGNOSIS — M4805 Spinal stenosis, thoracolumbar region: Secondary | ICD-10-CM | POA: Diagnosis not present

## 2020-06-25 DIAGNOSIS — I5040 Unspecified combined systolic (congestive) and diastolic (congestive) heart failure: Secondary | ICD-10-CM | POA: Diagnosis not present

## 2020-06-25 DIAGNOSIS — M810 Age-related osteoporosis without current pathological fracture: Secondary | ICD-10-CM | POA: Diagnosis not present

## 2020-06-25 DIAGNOSIS — G5603 Carpal tunnel syndrome, bilateral upper limbs: Secondary | ICD-10-CM | POA: Diagnosis not present

## 2020-06-25 DIAGNOSIS — M4802 Spinal stenosis, cervical region: Secondary | ICD-10-CM | POA: Diagnosis not present

## 2020-06-25 DIAGNOSIS — K219 Gastro-esophageal reflux disease without esophagitis: Secondary | ICD-10-CM | POA: Diagnosis not present

## 2020-06-26 NOTE — Telephone Encounter (Signed)
FL2 formed faxed to [phone] Attention to [facility]

## 2020-07-01 ENCOUNTER — Telehealth: Payer: Self-pay | Admitting: Internal Medicine

## 2020-07-01 DIAGNOSIS — R269 Unspecified abnormalities of gait and mobility: Secondary | ICD-10-CM

## 2020-07-01 DIAGNOSIS — F0391 Unspecified dementia with behavioral disturbance: Secondary | ICD-10-CM

## 2020-07-01 NOTE — Telephone Encounter (Signed)
[facility] [phone] (cell)  Patient fell on 11.28.21 at home Complaining of right leg pain Patient refused hospital visit and refused x-ray in the home  Currently taking aleve and tramadol which is in her current medication formulary for pain  Does the patients appointment need to be moved up from 12.15.21?

## 2020-07-02 ENCOUNTER — Encounter: Payer: Self-pay | Admitting: Internal Medicine

## 2020-07-02 DIAGNOSIS — G5603 Carpal tunnel syndrome, bilateral upper limbs: Secondary | ICD-10-CM | POA: Diagnosis not present

## 2020-07-02 DIAGNOSIS — M1990 Unspecified osteoarthritis, unspecified site: Secondary | ICD-10-CM | POA: Diagnosis not present

## 2020-07-02 DIAGNOSIS — I2510 Atherosclerotic heart disease of native coronary artery without angina pectoris: Secondary | ICD-10-CM | POA: Diagnosis not present

## 2020-07-02 DIAGNOSIS — G43909 Migraine, unspecified, not intractable, without status migrainosus: Secondary | ICD-10-CM | POA: Diagnosis not present

## 2020-07-02 DIAGNOSIS — Z8781 Personal history of (healed) traumatic fracture: Secondary | ICD-10-CM | POA: Diagnosis not present

## 2020-07-02 DIAGNOSIS — J45909 Unspecified asthma, uncomplicated: Secondary | ICD-10-CM | POA: Diagnosis not present

## 2020-07-02 DIAGNOSIS — K219 Gastro-esophageal reflux disease without esophagitis: Secondary | ICD-10-CM | POA: Diagnosis not present

## 2020-07-02 DIAGNOSIS — I110 Hypertensive heart disease with heart failure: Secondary | ICD-10-CM | POA: Diagnosis not present

## 2020-07-02 DIAGNOSIS — Z951 Presence of aortocoronary bypass graft: Secondary | ICD-10-CM | POA: Diagnosis not present

## 2020-07-02 DIAGNOSIS — L89892 Pressure ulcer of other site, stage 2: Secondary | ICD-10-CM | POA: Diagnosis not present

## 2020-07-02 DIAGNOSIS — H409 Unspecified glaucoma: Secondary | ICD-10-CM | POA: Diagnosis not present

## 2020-07-02 DIAGNOSIS — D649 Anemia, unspecified: Secondary | ICD-10-CM | POA: Diagnosis not present

## 2020-07-02 DIAGNOSIS — E785 Hyperlipidemia, unspecified: Secondary | ICD-10-CM | POA: Diagnosis not present

## 2020-07-02 DIAGNOSIS — M4316 Spondylolisthesis, lumbar region: Secondary | ICD-10-CM | POA: Diagnosis not present

## 2020-07-02 DIAGNOSIS — I5040 Unspecified combined systolic (congestive) and diastolic (congestive) heart failure: Secondary | ICD-10-CM | POA: Diagnosis not present

## 2020-07-02 DIAGNOSIS — E11319 Type 2 diabetes mellitus with unspecified diabetic retinopathy without macular edema: Secondary | ICD-10-CM | POA: Diagnosis not present

## 2020-07-02 DIAGNOSIS — R269 Unspecified abnormalities of gait and mobility: Secondary | ICD-10-CM

## 2020-07-02 DIAGNOSIS — Z794 Long term (current) use of insulin: Secondary | ICD-10-CM | POA: Diagnosis not present

## 2020-07-02 DIAGNOSIS — M4802 Spinal stenosis, cervical region: Secondary | ICD-10-CM | POA: Diagnosis not present

## 2020-07-02 DIAGNOSIS — Z7982 Long term (current) use of aspirin: Secondary | ICD-10-CM | POA: Diagnosis not present

## 2020-07-02 DIAGNOSIS — M810 Age-related osteoporosis without current pathological fracture: Secondary | ICD-10-CM | POA: Diagnosis not present

## 2020-07-02 DIAGNOSIS — M4805 Spinal stenosis, thoracolumbar region: Secondary | ICD-10-CM | POA: Diagnosis not present

## 2020-07-02 DIAGNOSIS — Z96651 Presence of right artificial knee joint: Secondary | ICD-10-CM | POA: Diagnosis not present

## 2020-07-02 NOTE — Telephone Encounter (Signed)
Holly Weiss with [facility] called and wanted to let Dr. Jenny Reichmann know that the patient fell over the weekend and EMS was called and the patient refused to go with them. She is having right knee pain when baring weight. She was also wondering if the patient could get a referral to a neurologist. She was also wondering if she could get a verbal order for [facility] to come to the patients house for an x-ray.

## 2020-07-02 NOTE — Telephone Encounter (Signed)
Sent to Dr. [name] to advise. 

## 2020-07-02 NOTE — Telephone Encounter (Signed)
[place] for verbal for right knee xray  [place] for neurology referral

## 2020-07-02 NOTE — Telephone Encounter (Signed)
See below

## 2020-07-03 ENCOUNTER — Telehealth: Payer: Self-pay | Admitting: Internal Medicine

## 2020-07-03 NOTE — Telephone Encounter (Signed)
Verbals given to [place] at [facility].

## 2020-07-03 NOTE — Telephone Encounter (Signed)
   Calvin from [facility] calling to request completion of FL2 for SNF Please call [phone]

## 2020-07-05 ENCOUNTER — Other Ambulatory Visit: Payer: Self-pay | Admitting: Internal Medicine

## 2020-07-05 ENCOUNTER — Encounter: Payer: Self-pay | Admitting: Internal Medicine

## 2020-07-05 MED ORDER — TIZANIDINE HCL 2 MG PO TABS
2.0000 mg | ORAL_TABLET | Freq: Four times a day (QID) | ORAL | 1 refills | Status: DC | PRN
Start: 2020-07-05 — End: 2020-08-26

## 2020-07-05 NOTE — Telephone Encounter (Signed)
Received 07/04/20 via email from [email or web address] in reference to telephone encounter from 06/19/20  Good morning. I am so sorry. Thank you for sending the FL2. Unfortunately,  our social worker has reviewed it and the diagnoses are not supportive of short term rehab. She has spoken with me about current diagnoses that are more appropriate in light of some changes to rehab requirements. Could you please list the Diagnoses in this order on the [place]?  We have these from her medical records..   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    Will put form on PCP desk for review.

## 2020-07-08 ENCOUNTER — Other Ambulatory Visit: Payer: Self-pay

## 2020-07-08 DIAGNOSIS — Z7982 Long term (current) use of aspirin: Secondary | ICD-10-CM | POA: Diagnosis not present

## 2020-07-08 DIAGNOSIS — E11319 Type 2 diabetes mellitus with unspecified diabetic retinopathy without macular edema: Secondary | ICD-10-CM | POA: Diagnosis not present

## 2020-07-08 DIAGNOSIS — Z789 Other specified health status: Secondary | ICD-10-CM | POA: Diagnosis not present

## 2020-07-08 DIAGNOSIS — M949 Disorder of cartilage, unspecified: Secondary | ICD-10-CM | POA: Diagnosis not present

## 2020-07-08 DIAGNOSIS — E785 Hyperlipidemia, unspecified: Secondary | ICD-10-CM | POA: Diagnosis not present

## 2020-07-08 DIAGNOSIS — G43909 Migraine, unspecified, not intractable, without status migrainosus: Secondary | ICD-10-CM | POA: Diagnosis not present

## 2020-07-08 DIAGNOSIS — M4805 Spinal stenosis, thoracolumbar region: Secondary | ICD-10-CM | POA: Diagnosis not present

## 2020-07-08 DIAGNOSIS — L89892 Pressure ulcer of other site, stage 2: Secondary | ICD-10-CM | POA: Diagnosis not present

## 2020-07-08 DIAGNOSIS — M810 Age-related osteoporosis without current pathological fracture: Secondary | ICD-10-CM | POA: Diagnosis not present

## 2020-07-08 DIAGNOSIS — D649 Anemia, unspecified: Secondary | ICD-10-CM | POA: Diagnosis not present

## 2020-07-08 DIAGNOSIS — Z951 Presence of aortocoronary bypass graft: Secondary | ICD-10-CM | POA: Diagnosis not present

## 2020-07-08 DIAGNOSIS — G5603 Carpal tunnel syndrome, bilateral upper limbs: Secondary | ICD-10-CM | POA: Diagnosis not present

## 2020-07-08 DIAGNOSIS — J45909 Unspecified asthma, uncomplicated: Secondary | ICD-10-CM | POA: Diagnosis not present

## 2020-07-08 DIAGNOSIS — I110 Hypertensive heart disease with heart failure: Secondary | ICD-10-CM | POA: Diagnosis not present

## 2020-07-08 DIAGNOSIS — M899 Disorder of bone, unspecified: Secondary | ICD-10-CM | POA: Diagnosis not present

## 2020-07-08 DIAGNOSIS — I5040 Unspecified combined systolic (congestive) and diastolic (congestive) heart failure: Secondary | ICD-10-CM | POA: Diagnosis not present

## 2020-07-08 DIAGNOSIS — Z96651 Presence of right artificial knee joint: Secondary | ICD-10-CM | POA: Diagnosis not present

## 2020-07-08 DIAGNOSIS — Z7409 Other reduced mobility: Secondary | ICD-10-CM | POA: Diagnosis not present

## 2020-07-08 DIAGNOSIS — M4316 Spondylolisthesis, lumbar region: Secondary | ICD-10-CM | POA: Diagnosis not present

## 2020-07-08 DIAGNOSIS — M4802 Spinal stenosis, cervical region: Secondary | ICD-10-CM | POA: Diagnosis not present

## 2020-07-08 DIAGNOSIS — H409 Unspecified glaucoma: Secondary | ICD-10-CM | POA: Diagnosis not present

## 2020-07-08 DIAGNOSIS — M1990 Unspecified osteoarthritis, unspecified site: Secondary | ICD-10-CM | POA: Diagnosis not present

## 2020-07-08 DIAGNOSIS — K219 Gastro-esophageal reflux disease without esophagitis: Secondary | ICD-10-CM | POA: Diagnosis not present

## 2020-07-08 DIAGNOSIS — Z8781 Personal history of (healed) traumatic fracture: Secondary | ICD-10-CM | POA: Diagnosis not present

## 2020-07-08 DIAGNOSIS — I2510 Atherosclerotic heart disease of native coronary artery without angina pectoris: Secondary | ICD-10-CM | POA: Diagnosis not present

## 2020-07-08 DIAGNOSIS — Z794 Long term (current) use of insulin: Secondary | ICD-10-CM | POA: Diagnosis not present

## 2020-07-08 MED ORDER — INSULIN GLARGINE 100 UNIT/ML ~~LOC~~ SOLN: 20.0000 [IU] | Freq: Every day | SUBCUTANEOUS | 11 refills | Status: DC

## 2020-07-10 ENCOUNTER — Encounter: Payer: Self-pay | Admitting: Internal Medicine

## 2020-07-10 ENCOUNTER — Encounter: Payer: Self-pay | Admitting: Neurology

## 2020-07-10 DIAGNOSIS — I5040 Unspecified combined systolic (congestive) and diastolic (congestive) heart failure: Secondary | ICD-10-CM | POA: Insufficient documentation

## 2020-07-10 DIAGNOSIS — M4802 Spinal stenosis, cervical region: Secondary | ICD-10-CM | POA: Insufficient documentation

## 2020-07-10 DIAGNOSIS — M4316 Spondylolisthesis, lumbar region: Secondary | ICD-10-CM | POA: Insufficient documentation

## 2020-07-10 DIAGNOSIS — M4805 Spinal stenosis, thoracolumbar region: Secondary | ICD-10-CM | POA: Insufficient documentation

## 2020-07-10 DIAGNOSIS — M1990 Unspecified osteoarthritis, unspecified site: Secondary | ICD-10-CM | POA: Insufficient documentation

## 2020-07-12 DIAGNOSIS — I509 Heart failure, unspecified: Secondary | ICD-10-CM | POA: Diagnosis not present

## 2020-07-12 DIAGNOSIS — J4520 Mild intermittent asthma, uncomplicated: Secondary | ICD-10-CM | POA: Diagnosis not present

## 2020-07-16 ENCOUNTER — Other Ambulatory Visit: Payer: Self-pay

## 2020-07-16 NOTE — Telephone Encounter (Signed)
Received this message from [place] on 07/04/20: Good morning. I am so sorry. Thank you for sending the FL2. Unfortunately,  our social worker has reviewed it and the diagnoses are not supportive of short term rehab. She has spoken with me about current diagnoses that are more appropriate in light of some changes to rehab requirements. Could you please list the Diagnoses in this order on the [place]?  We have these from her medical records.. Lumbar Spondylolisthesis M43.16 Unspecified osteoarthritis M19.90 Spinal stenosis cervical region M48.02 Spinal stenosis thoracolumbar region U42.90 Combined systolic and diastolic heart failure  Form given to PCP for review.  Rec'd completed form on 07/15/20 from PCP.  2nd completed FL2 form faxed to [phone]

## 2020-07-17 ENCOUNTER — Encounter: Payer: Self-pay | Admitting: Internal Medicine

## 2020-07-17 ENCOUNTER — Ambulatory Visit (INDEPENDENT_AMBULATORY_CARE_PROVIDER_SITE_OTHER): Payer: Medicare Other | Admitting: Internal Medicine

## 2020-07-17 VITALS — BP 124/80 | HR 61 | Temp 97.9°F | Ht 63.0 in

## 2020-07-17 DIAGNOSIS — Z23 Encounter for immunization: Secondary | ICD-10-CM

## 2020-07-17 DIAGNOSIS — F0392 Unspecified dementia, unspecified severity, with psychotic disturbance: Secondary | ICD-10-CM

## 2020-07-17 DIAGNOSIS — F0391 Unspecified dementia with behavioral disturbance: Secondary | ICD-10-CM

## 2020-07-17 DIAGNOSIS — I10 Essential (primary) hypertension: Secondary | ICD-10-CM

## 2020-07-17 DIAGNOSIS — N1831 Chronic kidney disease, stage 3a: Secondary | ICD-10-CM | POA: Diagnosis not present

## 2020-07-17 DIAGNOSIS — Z794 Long term (current) use of insulin: Secondary | ICD-10-CM | POA: Diagnosis not present

## 2020-07-17 DIAGNOSIS — E1165 Type 2 diabetes mellitus with hyperglycemia: Secondary | ICD-10-CM

## 2020-07-17 DIAGNOSIS — E785 Hyperlipidemia, unspecified: Secondary | ICD-10-CM

## 2020-07-17 LAB — PHOSPHORUS: Phosphorus: 3 mg/dL (ref 2.3–4.6)

## 2020-07-17 LAB — BASIC METABOLIC PANEL
BUN: 65 mg/dL — ABNORMAL HIGH (ref 6–23)
CO2: 29 mEq/L (ref 19–32)
Calcium: 10.1 mg/dL (ref 8.4–10.5)
Chloride: 97 mEq/L (ref 96–112)
Creatinine, Ser: 1.52 mg/dL — ABNORMAL HIGH (ref 0.40–1.20)
GFR: 32.4 mL/min — ABNORMAL LOW (ref >60.00)
Glucose, Bld: 399 mg/dL — ABNORMAL HIGH (ref 70–99)
Potassium: 4.4 mEq/L (ref 3.5–5.1)
Sodium: 133 mEq/L — ABNORMAL LOW (ref 135–145)

## 2020-07-17 LAB — LIPID PANEL
Cholesterol: 141 mg/dL (ref 0–200)
HDL: 36.9 mg/dL — ABNORMAL LOW (ref >39.00)
LDL Cholesterol: 81 mg/dL (ref 0–99)
NonHDL: 104.29
Total CHOL/HDL Ratio: 4
Triglycerides: 116 mg/dL (ref 0.0–149.0)
VLDL: 23.2 mg/dL (ref 0.0–40.0)

## 2020-07-17 LAB — HEPATIC FUNCTION PANEL
ALT: 17 U/L (ref 0–35)
AST: 14 U/L (ref 0–37)
Albumin: 3.7 g/dL (ref 3.5–5.2)
Alkaline Phosphatase: 83 U/L (ref 39–117)
Bilirubin, Direct: 0.1 mg/dL (ref 0.0–0.3)
Total Bilirubin: 0.3 mg/dL (ref 0.2–1.2)
Total Protein: 8 g/dL (ref 6.0–8.3)

## 2020-07-17 NOTE — Progress Notes (Deleted)
   Subjective:    Patient ID: Holly Weiss, female    DOB: 1940-11-01, 79 y.o.   MRN: [id number]  HPI    Review of Systems     Objective:   Physical Exam        Assessment & Plan:

## 2020-07-17 NOTE — Patient Instructions (Signed)
You had the flu shot today  Please continue all other medications as before, and refills have been done if requested.  Please have the pharmacy call with any other refills you may need.  Please continue your efforts at being more active, low cholesterol diet, and weight control.  Please keep your appointments with your specialists as you may have planned  Please go to the LAB at the blood drawing area for the tests to be done  You will be contacted by phone if any changes need to be made immediately.  Otherwise, you will receive a letter about your results with an explanation, but please check with MyChart first.  Please remember to sign up for MyChart if you have not done so, as this will be important to you in the future with finding out test results, communicating by private email, and scheduling acute appointments online when needed.  Please make an Appointment to return in 6 months, or sooner if needed 

## 2020-07-18 ENCOUNTER — Telehealth: Payer: Self-pay | Admitting: Internal Medicine

## 2020-07-18 ENCOUNTER — Encounter: Payer: Self-pay | Admitting: Internal Medicine

## 2020-07-18 DIAGNOSIS — F0392 Unspecified dementia, unspecified severity, with psychotic disturbance: Secondary | ICD-10-CM

## 2020-07-18 DIAGNOSIS — F22 Delusional disorders: Secondary | ICD-10-CM

## 2020-07-18 DIAGNOSIS — F0391 Unspecified dementia with behavioral disturbance: Secondary | ICD-10-CM

## 2020-07-18 DIAGNOSIS — F32A Depression, unspecified: Secondary | ICD-10-CM

## 2020-07-18 LAB — VITAMIN D 25 HYDROXY (VIT D DEFICIENCY, FRACTURES): VITD: 103.55 ng/mL (ref 30.00–100.00)

## 2020-07-18 LAB — HEMOGLOBIN A1C: Hgb A1c MFr Bld: 9.5 % — ABNORMAL HIGH (ref 4.6–6.5)

## 2020-07-18 LAB — PTH, INTACT AND CALCIUM
Calcium: 9.9 mg/dL (ref 8.6–10.4)
PTH: 50 pg/mL (ref 14–64)

## 2020-07-18 NOTE — Telephone Encounter (Signed)
The sore that was mentioned was the leg wound to the right lower leg  No other sores were mentioned  Pt refused referral for wound care or home health, and I cannot force her to accept this

## 2020-07-18 NOTE — Telephone Encounter (Signed)
   Patient's daughter Dessa Phi calling upset. She states the patient had a office visit on 12/15 and the sore on her body and feet were not addressed.  Requesting return call from Dr  Jenny Reichmann

## 2020-07-18 NOTE — Telephone Encounter (Signed)
Getting guardianship and also POA for healthcare are good ideas.  Competancy would need to be assessed per psychiatry, so let me know if the patient will agree to this and I will refer.

## 2020-07-18 NOTE — Telephone Encounter (Signed)
Written by Elza Rafter rn:  Daughter called to notify that PCP said that he addressed the sore on pt leg, but no other sores were mentioned.  Daughter states she didn't mention about the additional wounds b/c her mother requested that they not be mentioned.  Daughters also state that Dr Jenny Reichmann was not in the room long & that a full physical assessment was not completed.  Pt states that he did not request wound or referral during the visit, however, they also stated that their mother would not agree to these things & have demanded that Dr Jenny Reichmann do them anyway.  Family advised that Dr Jenny Reichmann cannot make the patient do what she does not want to do if she is capable of speaking up for herself.   Daughters are also requesting Dr Jenny Reichmann treat her dementia.  Daughters state pt is not capable of making decisions on her own.   Daughters requesting legal guardianship papers be sent to them.  Will f/u to inquire how to obtain.

## 2020-07-19 ENCOUNTER — Telehealth: Payer: Self-pay | Admitting: Internal Medicine

## 2020-07-19 NOTE — Telephone Encounter (Signed)
Ok this is done 

## 2020-07-19 NOTE — Telephone Encounter (Signed)
Patient calling back to report she is unable to give herself the insulin injection due to limited use of her hands.  Please advise

## 2020-07-19 NOTE — Telephone Encounter (Signed)
Patient calling back stating she received a bottle of medicine instead of a pen and she is unsure why

## 2020-07-19 NOTE — Telephone Encounter (Signed)
See below

## 2020-07-22 ENCOUNTER — Encounter: Payer: Self-pay | Admitting: Family

## 2020-07-22 ENCOUNTER — Other Ambulatory Visit: Payer: Self-pay

## 2020-07-22 ENCOUNTER — Telehealth (INDEPENDENT_AMBULATORY_CARE_PROVIDER_SITE_OTHER): Payer: Medicare Other | Admitting: Family

## 2020-07-22 DIAGNOSIS — E119 Type 2 diabetes mellitus without complications: Secondary | ICD-10-CM | POA: Diagnosis not present

## 2020-07-22 MED ORDER — LANTUS SOLOSTAR 100 UNIT/ML ~~LOC~~ SOPN: 20.0000 [IU] | PEN_INJECTOR | Freq: Every day | SUBCUTANEOUS | 11 refills | Status: DC

## 2020-07-22 NOTE — Progress Notes (Addendum)
Holly Weiss is a 79 y.o. female with the following history as recorded in EpicCare:  Patient Active Problem List   Diagnosis Date Noted  . Spondylolisthesis of lumbar region 07/10/2020  . Unspecified osteoarthritis, unspecified site 07/10/2020  . Spinal stenosis, cervical region 07/10/2020  . Spinal stenosis, thoracolumbar region 07/10/2020  . Combined systolic and diastolic congestive heart failure ([place]) 07/10/2020  . Dementia ([place])   . Cervical stenosis of spinal canal 09/28/2019  . Frequent falls 09/27/2019  . Fracture of 5th metatarsal 09/27/2019  . Cervical spinal stenosis 09/27/2019  . Cord compression syndrome ([place]) 09/27/2019  . Right shoulder pain 11/02/2018  . Right rotator cuff tear 11/02/2018  . Gait disorder 11/02/2018  . AKI (acute kidney injury) ([place]) 02/09/2018  . Stasis dermatitis 11/06/2016  . CKD (chronic kidney disease), stage III ([place]) 09/24/2016  . Rash 09/24/2016  . Sudden right hearing loss 02/25/2016  . Vertigo 02/25/2016  . Multiple open wounds of lower leg 01/28/2016  . Pain of right heel 01/28/2016  . Osteoporosis 06/04/2015  . Disorder of bone and cartilage 06/04/2015  . 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 CAD in native artery 11/16/2014  . EKG, abnormal 08/21/2014  . Dystonia 07/20/2014  . S/P hardware removal 07/19/2014  . 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 Lower leg edema 06/22/2014  . History of total right knee replacement 06/05/2014  . Tibial fracture 01/16/2014  . Cellulitis of leg, right 11/17/2013  . 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 Other dysphagia 11/04/2013  . Morbid obesity ([place]) 10/22/2013  . Diabetes type 2, uncontrolled ([place]) 10/21/2013  . DJD (degenerative joint disease) of knee 07/12/2013  . 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 Depression 02/14/2013  . Visual hallucinations 02/14/2013  . Pain in joint of right knee  02/14/2013  . Uncontrolled diabetes mellitus with hyperglycemia, with long-term current use of insulin ([place])   . Neck pain, acute 07/21/2011  . Right knee pain 07/21/2011  . Encounter for well adult exam with abnormal findings 01/27/2011  . Edema 09/09/2010  . KNEE PAIN, BILATERAL 09/01/2010  . Arthropathy, multiple sites 08/18/2010  . Chronic pain syndrome 01/21/2010  . DYSPEPSIA 01/21/2010  . FATIGUE 01/21/2010  . DYSPNEA ON EXERTION 01/21/2010  . MYOCARDIAL PERFUSION SCAN, WITH STRESS TEST, ABNORMAL 05/01/2009  . SHOULDER PAIN, BILATERAL 04/09/2009  . CHEST PAIN 04/09/2009  . NAUSEA 04/09/2009  . JOINT EFFUSION, LEFT KNEE 11/29/2008  . Abdominal pain, epigastric 08/20/2008  . ANEMIA-NOS 01/18/2008  . Chronic diastolic congestive heart failure ([place]) 01/18/2008  . Allergic rhinitis 01/18/2008  . LOW BACK PAIN 01/18/2008  . NEPHROLITHIASIS, HX OF 01/18/2008  . Essential hypertension 10/31/2007  . DIABETIC  RETINOPATHY 05/30/2007  . Hyperlipidemia 05/30/2007  . MIGRAINE HEADACHE 05/30/2007  . Unspecified glaucoma 05/30/2007  . Asthma 05/30/2007  . MORBID OBESITY, HX OF 05/30/2007    Current Outpatient Medications  Medication Sig Dispense Refill  . albuterol (VENTOLIN HFA) 108 (90 Base) MCG/ACT inhaler INHALE 2 PUFFS INTO THE LUNGS TWICE DAILY AS NEEDED FOR WHEEZING OR SHORTNESS OF BREATH (Patient taking differently: Inhale 2 puffs into the lungs 2 (two) times daily as needed for wheezing or shortness of breath.) 18 g 3  . Alcohol Swabs (ALCOHOL WIPES) 70 % PADS Apply topically.    Marland Kitchen aspirin 81 MG tablet Take 1 tablet (81 mg total) by mouth daily. 30 tablet 0  . Blood  Glucose Monitoring Suppl ([place]) w/Device KIT Use as directed once daily to check blood sugar.  Diagnosis code 250.02 1 each 0  . Calcium Carbonate-Vitamin D (CALTRATE 600+D PO) Take 2 tablets by mouth daily.    . Continuous Blood Gluc Receiver (FREESTYLE LIBRE 14 DAY READER) DEVI Apply 1 Device  topically daily. Use as directed daily E11.9 1 each 0  . Continuous Blood Gluc Sensor (FREESTYLE LIBRE 14 DAY SENSOR) MISC Apply 1 Device topically every 14 (fourteen) days. E11.9 6 each 3  . furosemide (LASIX) 40 MG tablet TAKE 1 TABLET BY MOUTH EVERY DAY 90 tablet 3  . glucose blood (ONE TOUCH TEST STRIPS) test strip Use as directed once daily to check blood sugar.  Diagnosis code E11.9 100 each 11  . HUMALOG KWIKPEN 100 UNIT/ML KwikPen SMARTSIG:15 Unit(s) SUB-Q Every Evening    . insulin glargine (LANTUS SOLOSTAR) 100 UNIT/ML Solostar Pen Inject 20 Units into the skin daily. 15 mL 11  . Insulin Pen Needle (B-D ULTRAFINE III SHORT PEN) 31G X 8 MM MISC USE TO CHECK BLOOD SUGERS TWICE DAILY 100 each 3  . melatonin 3 MG TABS tablet Take by mouth.    . metoprolol succinate (TOPROL-XL) 100 MG 24 hr tablet TAKE 1 TABLET BY MOUTH DAILY WITH OR IMMEDIATELY FOLLOWING A MEAL 90 tablet 3  . Multiple Vitamins-Minerals (ONE-A-DAY EXTRAS ANTIOXIDANT PO) Take 1 tablet by mouth daily.    . ondansetron (ZOFRAN) 4 MG tablet TAKE 1 TABLET(4 MG) BY MOUTH EVERY 8 HOURS AS NEEDED FOR NAUSEA OR VOMITING 20 tablet 0  . ONE TOUCH LANCETS MISC Use as directed once daily to check blood sugar. DX E11.09 100 each 3  . QUEtiapine (SEROQUEL) 100 MG tablet Take 1 tablet (100 mg total) by mouth at bedtime. 90 tablet 1  . rosuvastatin (CRESTOR) 20 MG tablet TAKE 1/2 TABLET BY MOUTH EVERY DAY 90 tablet 1  . tiZANidine (ZANAFLEX) 2 MG tablet Take 1 tablet (2 mg total) by mouth every 6 (six) hours as needed for muscle spasms. 40 tablet 1  . traMADol (ULTRAM) 50 MG tablet TAKE 1 TABLET(50 MG) BY MOUTH EVERY 6 HOURS AS NEEDED 30 tablet 2   No current facility-administered medications for this visit.    Allergies: Adhesive [tape], Endocet [oxycodone-acetaminophen], Latex, and Oxycodone-acetaminophen  Past Medical History:  Diagnosis Date  . ANEMIA-NOS 01/18/2008  . Arthritis   . 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 ASTHMA, WITH  ACUTE EXACERBATION 11/29/2008  . Bilateral carpal tunnel syndrome   . Chronic pain syndrome 01/21/2010  . Dementia ([place])   . Depression   . DIABETIC  RETINOPATHY 05/30/2007  . DM, UNCOMPLICATED, TYPE II, UNCONTROLLED 05/30/2007  . Edema of both legs    takes Lasix  . Femur fracture, right ([place])   . GERD (gastroesophageal reflux disease)   . 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 History of blood transfusion    spine surgery  . History of kidney stones   . HYPERLIPIDEMIA 05/30/2007  . HYPERTENSION 10/31/2007  . LOW BACK PAIN 01/18/2008  . Migraine headache   . MORBID OBESITY, HX OF 05/30/2007  . NEPHROLITHIASIS, HX OF 01/18/2008  . Numbness and tingling in hands   . Seasonal allergies   . Shortness of breath    with walking  . SHOULDER PAIN, BILATERAL 04/09/2009  . Spinal stenosis   . Urgency of urination     Past Surgical History:  Procedure Laterality Date  . BACK SURGERY    . CATARACT EXTRACTION    .  CHOLECYSTECTOMY    . lumbar disc surgury    . ORIF TIBIA FRACTURE Right 01/16/2014   Procedure: RIGHT TIBIA REPAIR NON-UNION/MALUNION TIBIA WITH SLIDING GRAFT;  Surgeon: Renette Butters, MD;  Location: [facility];  Service: Orthopedics;  Laterality: Right;  . THORACIC FUSION    . TOTAL KNEE ARTHROPLASTY WITH REVISION COMPONENTS Right 07/12/2013   Procedure: TOTAL KNEE ARTHROPLASTY WITH TIBIA REVISION COMPONENTS;  Surgeon: Ninetta Lights, MD;  Location: [facility];  Service: Orthopedics;  Laterality: Right;  . TUBAL LIGATION      Family History  Problem Relation Age of Onset  . Diabetes Mother   . Heart disease Mother   . Dementia Mother   . Lung cancer Father   . Heart disease Father   . Diabetes Sister   . Depression Sister     Social History   Tobacco Use  . Smoking status: Never Smoker  . Smokeless tobacco: Current User  Substance Use Topics  . Alcohol use: No    Subjective:   I connected with Salli Real on 07/22/20 at  3:00 PM EST by a telephone call  and verified that I am  speaking with the correct person using two identifiers.   I discussed the limitations of evaluation and management by telemedicine and the availability of in person appointments. The patient expressed understanding and agreed to proceed. Provider in office/ patient is at home; provider, patient's daughter and patient are only 3 people on video call. Patient does not help provide any of the history regarding need for visit.  Patient was seen by her PCP last week and was found to have elevated blood sugar ( almost 400) and hgba1c of 9.5; was told to take 20 units of Lantus; unfortunately, she was given syringe and needles rather than [facility]star pen; per daughter, she has refused to take any type of insulin since last Thursday; they have been checking her blood sugar and assumed they weren't getting enough blood because the reading is either error or high;  Today's visit was originally scheduled to be in person but had to be switched to virtual because the family could not wake the mother up in time to get to the office; per patient's daughter, her mother went to sleep at 4 am and was still sleeping at time of scheduled visit at 3 pm; notes it was difficult to rouse her;     Objective:  There were no vitals filed for this visit.  Lungs: Respirations unlabored;  Neurologic: Alert and oriented; speech intact; face symmetrical; moves all extremities well; CNII-XII intact without focal deficit   Assessment:  1. Type 2 diabetes mellitus without complication, without long-term current use of insulin ([place])     Plan:  I explained to the daughter that those type of readings indicate a patient's blood sugar is at least 500-600 and with her increased lethargy would recommend ER; Daughter wants to try and check blood sugar and wants to call back later for further direction. She notes her mother has been adamant that she will not go to the ER for any reason.  I asked family to also consider having EMS come to the  home to check on patient and they said they would consider;  Follow-up to be determined based on return phone call and follow-up blood sugar reading.   Time spent 15 minutes  The daughter sent a MyChart message indicating that sugar reading was "high." I again recommended ER but daughter notes that she had  given her mother 20 units of the Lantus and would monitor her all night. Family and patient are adamantly opposed to the ER. She does agree to call back with blood sugar reading in the am.   No follow-ups on file.  No orders of the defined types were placed in this encounter.   Requested Prescriptions    No prescriptions requested or ordered in this encounter

## 2020-07-24 ENCOUNTER — Encounter (HOSPITAL_COMMUNITY): Payer: Self-pay

## 2020-07-24 ENCOUNTER — Emergency Department (HOSPITAL_COMMUNITY)
Admission: EM | Admit: 2020-07-24 | Discharge: 2020-07-25 | Disposition: A | Discharge status: Home / Self Care | Payer: Medicare Other | Attending: Emergency Medicine | Admitting: Emergency Medicine

## 2020-07-24 ENCOUNTER — Encounter: Payer: Self-pay | Admitting: Family

## 2020-07-24 DIAGNOSIS — E1165 Type 2 diabetes mellitus with hyperglycemia: Secondary | ICD-10-CM | POA: Diagnosis not present

## 2020-07-24 DIAGNOSIS — J45909 Unspecified asthma, uncomplicated: Secondary | ICD-10-CM | POA: Diagnosis not present

## 2020-07-24 DIAGNOSIS — F0390 Unspecified dementia without behavioral disturbance: Secondary | ICD-10-CM | POA: Insufficient documentation

## 2020-07-24 DIAGNOSIS — Z20822 Contact with and (suspected) exposure to covid-19: Secondary | ICD-10-CM | POA: Diagnosis not present

## 2020-07-24 DIAGNOSIS — Z7982 Long term (current) use of aspirin: Secondary | ICD-10-CM | POA: Diagnosis not present

## 2020-07-24 DIAGNOSIS — Z951 Presence of aortocoronary bypass graft: Secondary | ICD-10-CM | POA: Diagnosis not present

## 2020-07-24 DIAGNOSIS — Z794 Long term (current) use of insulin: Secondary | ICD-10-CM | POA: Insufficient documentation

## 2020-07-24 DIAGNOSIS — N390 Urinary tract infection, site not specified: Secondary | ICD-10-CM

## 2020-07-24 DIAGNOSIS — I130 Hypertensive heart and chronic kidney disease with heart failure and stage 1 through stage 4 chronic kidney disease, or unspecified chronic kidney disease: Secondary | ICD-10-CM | POA: Diagnosis not present

## 2020-07-24 DIAGNOSIS — I2510 Atherosclerotic heart disease of native coronary artery without angina pectoris: Secondary | ICD-10-CM | POA: Diagnosis not present

## 2020-07-24 DIAGNOSIS — I10 Essential (primary) hypertension: Secondary | ICD-10-CM | POA: Diagnosis not present

## 2020-07-24 DIAGNOSIS — R739 Hyperglycemia, unspecified: Secondary | ICD-10-CM

## 2020-07-24 DIAGNOSIS — N183 Chronic kidney disease, stage 3 unspecified: Secondary | ICD-10-CM | POA: Diagnosis not present

## 2020-07-24 DIAGNOSIS — Z91040 Latex allergy status: Secondary | ICD-10-CM | POA: Diagnosis not present

## 2020-07-24 DIAGNOSIS — Z79899 Other long term (current) drug therapy: Secondary | ICD-10-CM | POA: Insufficient documentation

## 2020-07-24 DIAGNOSIS — Z96651 Presence of right artificial knee joint: Secondary | ICD-10-CM | POA: Insufficient documentation

## 2020-07-24 DIAGNOSIS — I5040 Unspecified combined systolic (congestive) and diastolic (congestive) heart failure: Secondary | ICD-10-CM | POA: Insufficient documentation

## 2020-07-24 DIAGNOSIS — Z743 Need for continuous supervision: Secondary | ICD-10-CM | POA: Diagnosis not present

## 2020-07-24 LAB — CBC WITH DIFFERENTIAL/PLATELET
Abs Immature Granulocytes: 0.02 10*3/uL (ref 0.00–0.07)
Basophils Absolute: 0.1 10*3/uL (ref 0.0–0.1)
Basophils Relative: 1 %
Eosinophils Absolute: 0.4 10*3/uL (ref 0.0–0.5)
Eosinophils Relative: 5 %
HCT: 33.7 % — ABNORMAL LOW (ref 36.0–46.0)
Hemoglobin: 11.4 g/dL — ABNORMAL LOW (ref 12.0–15.0)
Immature Granulocytes: 0 %
Lymphocytes Relative: 27 %
Lymphs Abs: 2.1 10*3/uL (ref 0.7–4.0)
MCH: 29.8 pg (ref 26.0–34.0)
MCHC: 33.8 g/dL (ref 30.0–36.0)
MCV: 88.2 fL (ref 80.0–100.0)
Monocytes Absolute: 0.7 10*3/uL (ref 0.1–1.0)
Monocytes Relative: 10 %
Neutro Abs: 4.4 10*3/uL (ref 1.7–7.7)
Neutrophils Relative %: 57 %
Platelets: 192 10*3/uL (ref 150–400)
RBC: 3.82 MIL/uL — ABNORMAL LOW (ref 3.87–5.11)
RDW: 14.5 % (ref 11.5–15.5)
WBC: 7.7 10*3/uL (ref 4.0–10.5)
nRBC: 0 % (ref 0.0–0.2)

## 2020-07-24 LAB — RESP PANEL BY RT-PCR (FLU A&B, COVID) ARPGX2
Influenza A by PCR: NEGATIVE
Influenza B by PCR: NEGATIVE
SARS Coronavirus 2 by RT PCR: NEGATIVE

## 2020-07-24 LAB — URINALYSIS, ROUTINE W REFLEX MICROSCOPIC
Bilirubin Urine: NEGATIVE
Glucose, UA: >=500 mg/dL — AB
Hgb urine dipstick: NEGATIVE
Ketones, ur: NEGATIVE mg/dL
Nitrite: NEGATIVE
Protein, ur: NEGATIVE mg/dL
Specific Gravity, Urine: 1.014 (ref 1.005–1.030)
WBC, UA: >50 WBC/hpf — ABNORMAL HIGH (ref 0–5)
pH: 5 (ref 5.0–8.0)

## 2020-07-24 LAB — COMPREHENSIVE METABOLIC PANEL
ALT: 15 U/L (ref 0–44)
AST: 21 U/L (ref 15–41)
Albumin: 3.1 g/dL — ABNORMAL LOW (ref 3.5–5.0)
Alkaline Phosphatase: 90 U/L (ref 38–126)
Anion gap: 10 (ref 5–15)
BUN: 56 mg/dL — ABNORMAL HIGH (ref 8–23)
CO2: 27 mmol/L (ref 22–32)
Calcium: 9.7 mg/dL (ref 8.9–10.3)
Chloride: 95 mmol/L — ABNORMAL LOW (ref 98–111)
Creatinine, Ser: 1.87 mg/dL — ABNORMAL HIGH (ref 0.44–1.00)
GFR, Estimated: 27 mL/min — ABNORMAL LOW (ref >60)
Glucose, Bld: 440 mg/dL — ABNORMAL HIGH (ref 70–99)
Potassium: 4.9 mmol/L (ref 3.5–5.1)
Sodium: 132 mmol/L — ABNORMAL LOW (ref 135–145)
Total Bilirubin: 0.6 mg/dL (ref 0.3–1.2)
Total Protein: 7.3 g/dL (ref 6.5–8.1)

## 2020-07-24 LAB — BLOOD GAS, VENOUS
Acid-Base Excess: 4.5 mmol/L — ABNORMAL HIGH (ref 0.0–2.0)
Bicarbonate: 27.8 mmol/L (ref 20.0–28.0)
O2 Saturation: 99.1 %
Patient temperature: 98.6
pCO2, Ven: 37.5 mmHg — ABNORMAL LOW (ref 44.0–60.0)
pH, Ven: 7.482 — ABNORMAL HIGH (ref 7.250–7.430)
pO2, Ven: 123 mmHg — ABNORMAL HIGH (ref 32.0–45.0)

## 2020-07-24 LAB — CBG MONITORING, ED
Glucose-Capillary: 338 mg/dL — ABNORMAL HIGH (ref 70–99)
Glucose-Capillary: 346 mg/dL — ABNORMAL HIGH (ref 70–99)
Glucose-Capillary: 363 mg/dL — ABNORMAL HIGH (ref 70–99)

## 2020-07-24 LAB — BETA-HYDROXYBUTYRIC ACID: Beta-Hydroxybutyric Acid: 0.19 mmol/L (ref 0.05–0.27)

## 2020-07-24 LAB — LIPASE, BLOOD: Lipase: 30 U/L (ref 11–51)

## 2020-07-24 MED ORDER — SODIUM CHLORIDE 0.9 % IV BOLUS
500.0000 mL | Freq: Once | INTRAVENOUS | Status: AC
  Administered 2020-07-24: 23:00:00 500 mL via INTRAVENOUS

## 2020-07-24 MED ORDER — SULFAMETHOXAZOLE-TRIMETHOPRIM 800-160 MG PO TABS: 1.0000 | ORAL_TABLET | Freq: Two times a day (BID) | ORAL | 0 refills | Status: AC

## 2020-07-24 MED ORDER — SULFAMETHOXAZOLE-TRIMETHOPRIM 800-160 MG PO TABS
1.0000 | ORAL_TABLET | Freq: Once | ORAL | Status: AC
  Administered 2020-07-25: 1 via ORAL
  Filled 2020-07-24: qty 1

## 2020-07-24 MED ORDER — INSULIN ASPART PROT & ASPART (70-30 MIX) 100 UNIT/ML ~~LOC~~ SUSP
6.0000 [IU] | Freq: Once | SUBCUTANEOUS | Status: AC
  Administered 2020-07-25: 6 [IU] via SUBCUTANEOUS

## 2020-07-24 MED ORDER — INSULIN ASPART PROT & ASPART (70-30 MIX) 100 UNIT/ML ~~LOC~~ SUSP
6.0000 [IU] | Freq: Once | SUBCUTANEOUS | Status: AC
  Administered 2020-07-24: 23:00:00 6 [IU] via SUBCUTANEOUS
  Filled 2020-07-24: qty 10

## 2020-07-24 NOTE — ED Provider Notes (Signed)
[facility] DEPT Provider Note   CSN: [id number] Arrival date & time: 07/24/20  2042     History Chief Complaint  Patient presents with   Hyperglycemia    Pt sent from home for decreased appetite and increased weakness. Per ems pt hasn't taken her insulin in 6 days. Ems states her FSBS was [place] is a 79 y.o. female.  HPI   Patient is a very pleasant 79 year old female with past medical history of dementia, DM, HTN, HLD who presents from home by ambulance for concern of weakness and hyperglycemia.  Report from EMS is that the patient has been weak with no appetite for the past couple days.  Her fingerstick at home was high which is what prompted family members to call an ambulance.  Patient admits that she had an episode of nausea a few days ago but denies any vomiting or diarrhea.  She denies any CP, SOB, shoulder/neck/jaw pain at this time.  No reported fever or sick contacts.  Past Medical History:  Diagnosis Date   ANEMIA-NOS 01/18/2008   Arthritis    ARTHRITIS, GENERALIZED 08/18/2010   ASTHMA, WITH ACUTE EXACERBATION 11/29/2008   Bilateral carpal tunnel syndrome    Chronic pain syndrome 01/21/2010   Dementia ([place])    Depression    DIABETIC  RETINOPATHY 14/48/1856   DM, UNCOMPLICATED, TYPE II, UNCONTROLLED 05/30/2007   Edema of both legs    takes Lasix   Femur fracture, right (HCC)    GERD (gastroesophageal reflux disease)    GLAUCOMA NOS 05/30/2007   History of blood transfusion    spine surgery   History of kidney stones    HYPERLIPIDEMIA 05/30/2007   HYPERTENSION 10/31/2007   LOW BACK PAIN 01/18/2008   Migraine headache    MORBID OBESITY, HX OF 05/30/2007   NEPHROLITHIASIS, HX OF 01/18/2008   Numbness and tingling in hands    Seasonal allergies    Shortness of breath    with walking   SHOULDER PAIN, BILATERAL 04/09/2009   Spinal stenosis    Urgency of urination     Patient Active Problem  List   Diagnosis Date Noted   Spondylolisthesis of lumbar region 07/10/2020   Unspecified osteoarthritis, unspecified site 07/10/2020   Spinal stenosis, cervical region 07/10/2020   Spinal stenosis, thoracolumbar region 07/10/2020   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   Dementia ([place])    Cervical stenosis of spinal canal 09/28/2019   Frequent falls 09/27/2019   Fracture of 5th metatarsal 09/27/2019   Cervical spinal stenosis 09/27/2019   Cord compression syndrome ([place]) 09/27/2019   Right shoulder pain 11/02/2018   Right rotator cuff tear 11/02/2018   Gait disorder 11/02/2018   AKI (acute kidney injury) ([place]) 02/09/2018   Stasis dermatitis 11/06/2016   CKD (chronic kidney disease), stage III ([place]) 09/24/2016   Rash 09/24/2016   Sudden right hearing loss 02/25/2016   Vertigo 02/25/2016   Multiple open wounds of lower leg 01/28/2016   Pain of right heel 01/28/2016   Osteoporosis 06/04/2015   Disorder of bone and cartilage 31/49/7026   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[place]) 01/08/2015   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   CAD in native artery 11/16/2014   EKG, abnormal 08/21/2014   Dystonia 07/20/2014   S/P hardware removal 07/19/2014   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 07/19/2014   Lower leg edema 06/22/2014   History of total right  knee replacement 06/05/2014   Tibial fracture 01/16/2014   Cellulitis of leg, right 11/17/2013   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   Other dysphagia 11/04/2013   Morbid obesity ([place]) 10/22/2013   Diabetes type 2, uncontrolled ([place]) 10/21/2013   DJD (degenerative joint disease) of knee 07/12/2013   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   Depression 02/14/2013   Visual hallucinations 02/14/2013   Pain in joint of right knee 02/14/2013   Uncontrolled diabetes mellitus with hyperglycemia, with long-term current use of insulin (HCC)     Neck pain, acute 07/21/2011   Right knee pain 07/21/2011   Encounter for well adult exam with abnormal findings 01/27/2011   Edema 09/09/2010   KNEE PAIN, BILATERAL 09/01/2010   Arthropathy, multiple sites 08/18/2010   Chronic pain syndrome 01/21/2010   DYSPEPSIA 01/21/2010   FATIGUE 01/21/2010   DYSPNEA ON EXERTION 01/21/2010   MYOCARDIAL PERFUSION SCAN, WITH STRESS TEST, ABNORMAL 05/01/2009   SHOULDER PAIN, BILATERAL 04/09/2009   CHEST PAIN 04/09/2009   NAUSEA 04/09/2009   JOINT EFFUSION, LEFT KNEE 11/29/2008   Abdominal pain, epigastric 08/20/2008   ANEMIA-NOS 01/18/2008   Chronic diastolic congestive heart failure ([place]) 01/18/2008   Allergic rhinitis 01/18/2008   LOW BACK PAIN 01/18/2008   NEPHROLITHIASIS, HX OF 01/18/2008   Essential hypertension 10/31/2007   DIABETIC  RETINOPATHY 05/30/2007   Hyperlipidemia 05/30/2007   MIGRAINE HEADACHE 05/30/2007   Unspecified glaucoma 05/30/2007   Asthma 05/30/2007   MORBID OBESITY, HX OF 05/30/2007    Past Surgical History:  Procedure Laterality Date   BACK SURGERY     CATARACT EXTRACTION     CHOLECYSTECTOMY     lumbar disc surgury     ORIF TIBIA FRACTURE Right 01/16/2014   Procedure: RIGHT TIBIA REPAIR NON-UNION/MALUNION TIBIA WITH SLIDING GRAFT;  Surgeon: Renette Butters, MD;  Location: [facility];  Service: Orthopedics;  Laterality: Right;   THORACIC FUSION     TOTAL KNEE ARTHROPLASTY WITH REVISION COMPONENTS Right 07/12/2013   Procedure: TOTAL KNEE ARTHROPLASTY WITH TIBIA REVISION COMPONENTS;  Surgeon: Ninetta Lights, MD;  Location: [facility];  Service: Orthopedics;  Laterality: Right;   TUBAL LIGATION       OB History    Gravida  5   Para  5   Term      Preterm      AB  0   Living  5     SAB      IAB      Ectopic      Multiple      Live Births              Family History  Problem Relation Age of Onset   Diabetes Mother    Heart disease Mother    Dementia  Mother    Lung cancer Father    Heart disease Father    Diabetes Sister    Depression Sister     Social History   Tobacco Use   Smoking status: Never Smoker   Smokeless tobacco: Current User  Substance Use Topics   Alcohol use: No   Drug use: No    Home Medications Prior to Admission medications   Medication Sig Start Date End Date Taking? Authorizing Provider  albuterol (VENTOLIN HFA) 108 (90 Base) MCG/ACT inhaler INHALE 2 PUFFS INTO THE LUNGS TWICE DAILY AS NEEDED FOR WHEEZING OR SHORTNESS OF BREATH Patient taking differently: Inhale 2 puffs into the lungs 2 (two) times daily as needed for wheezing or shortness of breath. 04/05/18  Biagio Borg, MD  Alcohol Swabs (ALCOHOL WIPES) 70 % PADS Apply topically. 03/13/20   [provider]  aspirin 81 MG tablet Take 1 tablet (81 mg total) by mouth daily. 01/17/14   Renette Butters, MD  Blood Glucose Monitoring Suppl ([place]) w/Device KIT Use as directed once daily to check blood sugar.  Diagnosis code 250.02 10/09/19   Biagio Borg, MD  Calcium Carbonate-Vitamin D (CALTRATE 600+D PO) Take 2 tablets by mouth daily.    [provider]  Continuous Blood Gluc Receiver (FREESTYLE LIBRE 14 DAY READER) DEVI Apply 1 Device topically daily. Use as directed daily E11.9 01/22/20   Biagio Borg, MD  Continuous Blood Gluc Sensor (FREESTYLE LIBRE 14 DAY SENSOR) MISC Apply 1 Device topically every 14 (fourteen) days. E11.9 01/22/20   Biagio Borg, MD  furosemide (LASIX) 40 MG tablet TAKE 1 TABLET BY MOUTH EVERY DAY 05/06/20   Biagio Borg, MD  glucose blood (ONE TOUCH TEST STRIPS) test strip Use as directed once daily to check blood sugar.  Diagnosis code E11.9 02/25/16   Biagio Borg, MD  HUMALOG KWIKPEN 100 UNIT/ML KwikPen SMARTSIG:15 Unit(s) SUB-Q Every Evening 01/08/20   [provider]  insulin glargine (LANTUS SOLOSTAR) 100 UNIT/ML Solostar Pen Inject 20 Units into the skin daily. 07/22/20   Biagio Borg, MD  Insulin Pen Needle (B-D ULTRAFINE III SHORT PEN) 31G X 8 MM MISC USE TO CHECK BLOOD SUGERS TWICE DAILY 06/28/17   Biagio Borg, MD  melatonin 3 MG TABS tablet Take by mouth. 03/08/20   [provider]  metoprolol succinate (TOPROL-XL) 100 MG 24 hr tablet TAKE 1 TABLET BY MOUTH DAILY WITH OR IMMEDIATELY FOLLOWING A MEAL 04/24/20   Biagio Borg, MD  Multiple Vitamins-Minerals (ONE-A-DAY EXTRAS ANTIOXIDANT PO) Take 1 tablet by mouth daily.    [provider]  ondansetron (ZOFRAN) 4 MG tablet TAKE 1 TABLET(4 MG) BY MOUTH EVERY 8 HOURS AS NEEDED FOR NAUSEA OR VOMITING 04/06/20   Biagio Borg, MD  ONE [facility] LANCETS MISC Use as directed once daily to check blood sugar. DX E11.09 06/28/17   Biagio Borg, MD  QUEtiapine (SEROQUEL) 100 MG tablet Take 1 tablet (100 mg total) by mouth at bedtime. 01/22/20   Biagio Borg, MD  rosuvastatin (CRESTOR) 20 MG tablet TAKE 1/2 TABLET BY MOUTH EVERY DAY 02/28/20   Biagio Borg, MD  tiZANidine (ZANAFLEX) 2 MG tablet Take 1 tablet (2 mg total) by mouth every 6 (six) hours as needed for muscle spasms. 07/05/20   Biagio Borg, MD  traMADol (ULTRAM) 50 MG tablet TAKE 1 TABLET(50 MG) BY MOUTH EVERY 6 HOURS AS NEEDED 04/06/20   Biagio Borg, MD    Allergies    Adhesive [tape], Endocet [oxycodone-acetaminophen], Latex, and Oxycodone-acetaminophen  Review of Systems   Review of Systems  Constitutional: Positive for appetite change and fatigue. Negative for chills and fever.  HENT: Negative for congestion.   Eyes: Negative for visual disturbance.  Respiratory: Negative for shortness of breath.   Cardiovascular: Negative for chest pain.  Gastrointestinal: Positive for nausea. Negative for abdominal pain, diarrhea and vomiting.  Genitourinary: Negative for dysuria.  Skin: Negative for rash.  Neurological: Negative for headaches.    Physical Exam Updated Vital Signs BP 123/68    Pulse 73    Temp 98.2 F (36.8 C) (Oral)    Resp 20    Ht 5' 1"   (1.549  m)    Wt 63.5 kg    SpO2 97%    BMI 26.45 kg/m   Physical Exam Vitals and nursing note reviewed.  Constitutional:      Appearance: Normal appearance.  HENT:     Head: Normocephalic.     Mouth/Throat:     Mouth: Mucous membranes are moist.  Cardiovascular:     Rate and Rhythm: Normal rate.  Pulmonary:     Effort: Pulmonary effort is normal. No respiratory distress.  Abdominal:     General: There is no distension.     Palpations: Abdomen is soft.     Tenderness: There is no abdominal tenderness. There is no guarding.  Musculoskeletal:        General: No signs of injury.  Skin:    General: Skin is warm.  Neurological:     Mental Status: She is alert and oriented to person, place, and time.  Psychiatric:        Mood and Affect: Mood normal.     ED Results / Procedures / Treatments   Labs (all labs ordered are listed, but only abnormal results are displayed) Labs Reviewed  CBC WITH DIFFERENTIAL/PLATELET - Abnormal; Notable for the following components:      Result Value   RBC 3.82 (*)    Hemoglobin 11.4 (*)    HCT 33.7 (*)    All other components within normal limits  BLOOD GAS, VENOUS - Abnormal; Notable for the following components:   pH, Ven 7.482 (*)    pCO2, Ven 37.5 (*)    pO2, Ven 123.0 (*)    Acid-Base Excess 4.5 (*)    All other components within normal limits  CBG MONITORING, ED - Abnormal; Notable for the following components:   Glucose-Capillary 363 (*)    All other components within normal limits  RESP PANEL BY RT-PCR (FLU A&B, COVID) [id number]  COMPREHENSIVE METABOLIC PANEL  LIPASE, BLOOD  URINALYSIS, ROUTINE W REFLEX MICROSCOPIC  BETA-HYDROXYBUTYRIC ACID    EKG EKG Interpretation  Date/Time:  Wednesday July 24 2020 21:04:53 EST Ventricular Rate:  71 PR Interval:    QRS Duration: 119 QT Interval:  456 QTC Calculation: 496 R Axis:   23 Text Interpretation: Sinus rhythm Incomplete left bundle branch block NSR, non specific T wave  changes, seen on previous Reconfirmed by Lavenia Atlas [phone]) on 07/24/2020 9:37:02 PM   Radiology No results found.  Procedures Procedures (including critical care time)  Medications Ordered in ED Medications - No data to display  ED Course  I have reviewed the triage vital signs and the nursing notes.  Pertinent labs & imaging results that were available during my care of the patient were reviewed by me and considered in my medical decision making (see chart for details).  Clinical Course as of 07/24/20 2345  Wed Jul 24, 2020  2311 Blood work shows baseline anemia, baseline kidney dysfunction.  She has a pseudohyponatremia with a glucose of 440.  Daughter is now at bedside, she states currently she is having her baseline mental status.  Covid swab was negative.  Patient was just able to give a urine sample, UA is pending. Daughter denies any known CP or SOB with the patient. [KH]    Clinical Course User Index [KH] Eduardo Wurth, Alvin Critchley, DO   MDM Rules/Calculators/A&P                          79 year old female presents the emergency department with  decreased appetite, weakness and hyperglycemia.  Fingerstick on arrival was in the 300s.  Patient admits to nausea, abdominal exam is benign.  Vitals are stable.  She is alert and oriented x3 and seems reliable but her baseline in regards to the dementia is unknown.  No family members at bedside currently.  Daughter is at bedside.  She confirms that patient is currently in her baseline mental status.  Blood work shows mild dehydration and hyperglycemia without DKA.  Patient was given subcutaneous insulin, urinalysis is LE positive showing urinary tract infection.  Patient will be given antibiotic.  Daughter is happy to take the patient home for outpatient follow-up.  Patient will be discharged and treated as an outpatient.  Discharge plan discussed, patient verbalizes understanding and agreement. Final Clinical Impression(s) / ED  Diagnoses Final diagnoses:  None    Rx / DC Orders ED Discharge Orders    None       Lorelle Gibbs, DO 07/24/20 2346

## 2020-07-24 NOTE — Discharge Instructions (Addendum)
You have been seen and discharged from the emergency department.  Follow-up with your primary provider for reevaluation. Take new prescriptions and home medications as prescribed. If you have any worsening symptoms or further concerns or health please return to emergency department for further evaluation.

## 2020-07-24 NOTE — ED Notes (Signed)
Attempted to collect urine sample pt stated she was unable to provide one at this time, and she would notify when she needed to use the restroom.

## 2020-07-25 NOTE — Telephone Encounter (Signed)
Can you call and have them increase to 25 units lantus via pen daily. Monitor blood sugar levels and call back or send reading Monday for further adjustment if needed. Push water and avoid sugary beverage/food and limited starches. Is she using a humalog pen also? If so advise 5 units with meals.

## 2020-07-27 LAB — URINE CULTURE: Culture: 80000 — AB

## 2020-07-28 ENCOUNTER — Telehealth (HOSPITAL_BASED_OUTPATIENT_CLINIC_OR_DEPARTMENT_OTHER): Payer: Self-pay | Admitting: Emergency Medicine

## 2020-07-28 NOTE — Telephone Encounter (Signed)
Post ED Visit - Positive Culture Follow-up  Culture report reviewed by antimicrobial stewardship pharmacist: [facility] []  Elenor Quinones, Pharm.D. []  Heide Guile, Pharm.D., BCPS AQ-ID []  Parks Neptune, Pharm.D., BCPS []  Alycia Rossetti, Pharm.D., BCPS []  [place], Pharm.D., BCPS, AAHIVP []  Legrand Como, Pharm.D., BCPS, AAHIVP []  Salome Arnt, PharmD, BCPS []  Johnnette Gourd, PharmD, BCPS []  Hughes Better, PharmD, BCPS []  Laqueta Linden, PharmD, BCPS []  Albertina Parr, PharmD  [facility] []  Leodis Sias, PharmD [x]  Mercy Riding, PharmD []  Royetta Asal, PharmD []  Graylin Shiver, Rph []  Rema Fendt) Glennon Mac, PharmD []  Arlyn Dunning, PharmD []  Netta Cedars, PharmD []  Dia Sitter, PharmD []  Leone Haven, PharmD []  Gretta Arab, PharmD []  Theodis Shove, PharmD []  Peggyann Juba, PharmD []  Reuel Boom, PharmD   Positive urine culture Treated with Sulfathoxazole-trimethoprim, organism sensitive to the same and no further patient follow-up is required at this time.  Sandi Raveling Morgaine Kimball 07/28/2020, 5:55 PM

## 2020-08-02 ENCOUNTER — Other Ambulatory Visit: Payer: Self-pay | Admitting: Internal Medicine

## 2020-08-04 ENCOUNTER — Encounter: Payer: Self-pay | Admitting: Internal Medicine

## 2020-08-04 NOTE — Assessment & Plan Note (Signed)
Lab Results  Component Value Date   CREATININE 1.87 (H) 07/24/2020  stable overall by history and exam, recent data reviewed with pt, and pt to continue medical treatment as before,  to f/u any worsening symptoms or concerns

## 2020-08-04 NOTE — Assessment & Plan Note (Signed)
Lab Results  Component Value Date   LDLCALC 81 07/17/2020  stable overall by history and exam, recent data reviewed with pt, and pt to continue medical treatment as before,  to f/u any worsening symptoms or concerns

## 2020-08-04 NOTE — Assessment & Plan Note (Signed)
124/80 today - stable overall by history and exam, recent data reviewed with pt, and pt to continue medical treatment as before,  to f/u any worsening symptoms or concerns

## 2020-08-04 NOTE — Assessment & Plan Note (Signed)
With reduced wt over the past yr, but suspect may need increased insulin - for a1c today

## 2020-08-04 NOTE — Progress Notes (Addendum)
Established Patient Office Visit  Subjective:  Patient ID: Holly Weiss, female    DOB: [birth year]  Age: 80 y.o. MRN: [id number]      Chief Complaint: (concise statement describing the symptom, problem, condition, diagnosis, physician recommended return, or other factor as reason for encounter) follow up HTN, HLD, hyperglycemia, ckd       HPI:  Holly Weiss is a 80 y.o. female here to f/u with family; overall doing ok and denies chest pain, increasing sob or doe, wheezing, orthopnea, PND, increased LE swelling over baseline, palpitations, dizziness or syncope.  Pt denies new neurological symptoms such as new headache, or facial or extremity weakness or numbness.  Pt denies polydipsia, polyuria, or symptomatic low sugars. Pt states overall good compliance with meds, mostly trying to follow appropriate diet, with wt overall down remarkably in the past yr,  but no exercise however. Dementia overall stable symptomatically, and not assoc with behavioral changes such as hallucinations, paranoia, or agitation. Denies urinary symptoms such as dysuria, frequency, urgency, flank pain, hematuria or n/v, fever, chills.  Due for flu shot Wt Readings from Last 3 Encounters:  07/24/20 140 lb (63.5 kg)  09/27/19 182 lb 15 oz (83 kg)  06/19/19 208 lb 9.6 oz (94.6 kg)   BP Readings from Last 3 Encounters:  07/25/20 (!) 129/103  07/17/20 124/80  01/22/20 118/82    Past Medical History:  Diagnosis Date  . ANEMIA-NOS 01/18/2008  . Arthritis   . 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 ASTHMA, WITH ACUTE EXACERBATION 11/29/2008  . Bilateral carpal tunnel syndrome   . Chronic pain syndrome 01/21/2010  . Dementia ([place])   . Depression   . DIABETIC  RETINOPATHY 05/30/2007  . DM, UNCOMPLICATED, TYPE II, UNCONTROLLED 05/30/2007  . Edema of both legs    takes Lasix  . Femur fracture, right ([place])   . GERD (gastroesophageal reflux disease)   . 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 History of blood transfusion    spine surgery   . History of kidney stones   . HYPERLIPIDEMIA 05/30/2007  . HYPERTENSION 10/31/2007  . LOW BACK PAIN 01/18/2008  . Migraine headache   . MORBID OBESITY, HX OF 05/30/2007  . NEPHROLITHIASIS, HX OF 01/18/2008  . Numbness and tingling in hands   . Seasonal allergies   . Shortness of breath    with walking  . SHOULDER PAIN, BILATERAL 04/09/2009  . Spinal stenosis   . Urgency of urination    Past Surgical History:  Procedure Laterality Date  . BACK SURGERY    . CATARACT EXTRACTION    . CHOLECYSTECTOMY    . lumbar disc surgury    . ORIF TIBIA FRACTURE Right 01/16/2014   Procedure: RIGHT TIBIA REPAIR NON-UNION/MALUNION TIBIA WITH SLIDING GRAFT;  Surgeon: Renette Butters, MD;  Location: [facility];  Service: Orthopedics;  Laterality: Right;  . THORACIC FUSION    . TOTAL KNEE ARTHROPLASTY WITH REVISION COMPONENTS Right 07/12/2013   Procedure: TOTAL KNEE ARTHROPLASTY WITH TIBIA REVISION COMPONENTS;  Surgeon: Ninetta Lights, MD;  Location: [facility];  Service: Orthopedics;  Laterality: Right;  . TUBAL LIGATION      reports that she has never smoked. She uses smokeless tobacco. She reports that she does not drink alcohol and does not use drugs. family history includes Dementia in her mother; Depression in her sister; Diabetes in her mother and sister; Heart disease in her father and mother; Lung cancer in her father. Allergies  Allergen Reactions  . Adhesive [Tape] Itching  .  Endocet [Oxycodone-Acetaminophen] Itching  . Latex Itching  . Oxycodone-Acetaminophen Itching   Current Outpatient Medications on File Prior to Visit  Medication Sig Dispense Refill  . albuterol (VENTOLIN HFA) 108 (90 Base) MCG/ACT inhaler INHALE 2 PUFFS INTO THE LUNGS TWICE DAILY AS NEEDED FOR WHEEZING OR SHORTNESS OF BREATH (Patient taking differently: Inhale 2 puffs into the lungs 2 (two) times daily as needed for wheezing or shortness of breath.) 18 g 3  . Alcohol Swabs (ALCOHOL WIPES) 70 % PADS Apply topically.    Marland Kitchen  aspirin 81 MG tablet Take 1 tablet (81 mg total) by mouth daily. 30 tablet 0  . Blood Glucose Monitoring Suppl (Erma) w/Device KIT Use as directed once daily to check blood sugar.  Diagnosis code 250.02 1 each 0  . Calcium Carbonate-Vitamin D (CALTRATE 600+D PO) Take 2 tablets by mouth daily.    . Continuous Blood Gluc Receiver (FREESTYLE LIBRE 14 DAY READER) DEVI Apply 1 Device topically daily. Use as directed daily E11.9 1 each 0  . Continuous Blood Gluc Sensor (FREESTYLE LIBRE 14 DAY SENSOR) MISC Apply 1 Device topically every 14 (fourteen) days. E11.9 6 each 3  . furosemide (LASIX) 40 MG tablet TAKE 1 TABLET BY MOUTH EVERY DAY 90 tablet 3  . glucose blood (ONE TOUCH TEST STRIPS) test strip Use as directed once daily to check blood sugar.  Diagnosis code E11.9 100 each 11  . HUMALOG KWIKPEN 100 UNIT/ML KwikPen SMARTSIG:15 Unit(s) SUB-Q Every Evening    . Insulin Pen Needle (B-D ULTRAFINE III SHORT PEN) 31G X 8 MM MISC USE TO CHECK BLOOD SUGERS TWICE DAILY 100 each 3  . melatonin 3 MG TABS tablet Take by mouth.    . metoprolol succinate (TOPROL-XL) 100 MG 24 hr tablet TAKE 1 TABLET BY MOUTH DAILY WITH OR IMMEDIATELY FOLLOWING A MEAL 90 tablet 3  . Multiple Vitamins-Minerals (ONE-A-DAY EXTRAS ANTIOXIDANT PO) Take 1 tablet by mouth daily.    . ondansetron (ZOFRAN) 4 MG tablet TAKE 1 TABLET(4 MG) BY MOUTH EVERY 8 HOURS AS NEEDED FOR NAUSEA OR VOMITING 20 tablet 0  . ONE TOUCH LANCETS MISC Use as directed once daily to check blood sugar. DX E11.09 100 each 3  . rosuvastatin (CRESTOR) 20 MG tablet TAKE 1/2 TABLET BY MOUTH EVERY DAY 90 tablet 1  . tiZANidine (ZANAFLEX) 2 MG tablet Take 1 tablet (2 mg total) by mouth every 6 (six) hours as needed for muscle spasms. 40 tablet 1  . traMADol (ULTRAM) 50 MG tablet TAKE 1 TABLET(50 MG) BY MOUTH EVERY 6 HOURS AS NEEDED 30 tablet 2   No current facility-administered medications on file prior to visit.        ROS:  All others reviewed and  negative.  Objective        PE:  BP 124/80 (BP Location: Left Arm, Patient Position: Sitting, Cuff Size: Large)   Pulse 61   Temp 97.9 F (36.6 C) (Oral)   Ht _0  (1.6 m)   SpO2 99%   BMI 32.41 kg/m                 Constitutional: Pt appears in NAD               HENT: Head: NCAT.                Right Ear: External ear normal.                 Left Ear: External  ear normal.                Eyes: . Pupils are equal, round, and reactive to light. Conjunctivae and EOM are normal               Nose: without d/c or deformity               Neck: Neck supple. Gross normal ROM               Cardiovascular: Normal rate and regular rhythm.                 Pulmonary/Chest: Effort normal and breath sounds without rales or wheezing.                Abd:  Soft, NT, ND, + BS, no organomegaly               Neurological: Pt is alert. At baseline orientation, motor grossly intact               Skin: Skin is warm. No rashes, no other new lesions, LE edema - trace bilat               Psychiatric: Pt behavior is without agitation   Assessment/Plan:  Holly Weiss is a 80 y.o. Black or African American [2] female with  has a past medical history of ANEMIA-NOS (01/18/2008), Arthritis, ARTHRITIS, GENERALIZED (08/18/2010), ASTHMA, WITH ACUTE EXACERBATION (11/29/2008), Bilateral carpal tunnel syndrome, Chronic pain syndrome (01/21/2010), Dementia ([place]), Depression, DIABETIC  RETINOPATHY (69/62/9528), DM, UNCOMPLICATED, TYPE II, UNCONTROLLED (05/30/2007), Edema of both legs, Femur fracture, right ([place]), GERD (gastroesophageal reflux disease), GLAUCOMA NOS (05/30/2007), History of blood transfusion, History of kidney stones, HYPERLIPIDEMIA (05/30/2007), HYPERTENSION (10/31/2007), LOW BACK PAIN (01/18/2008), Migraine headache, MORBID OBESITY, HX OF (05/30/2007), NEPHROLITHIASIS, HX OF (01/18/2008), Numbness and tingling in hands, Seasonal allergies, Shortness of breath, SHOULDER PAIN, BILATERAL (04/09/2009), Spinal stenosis, and  Urgency of urination.   Assessment Plan  See problem oriented assessment and plan Labs reviewed for each problem: Lab Results  Component Value Date   WBC 7.7 07/24/2020   HGB 11.4 (L) 07/24/2020   HCT 33.7 (L) 07/24/2020   PLT 192 07/24/2020   GLUCOSE 440 (H) 07/24/2020   CHOL 141 07/17/2020   TRIG 116.0 07/17/2020   HDL 36.90 (L) 07/17/2020   LDLCALC 81 07/17/2020   ALT 15 07/24/2020   AST 21 07/24/2020   NA 132 (L) 07/24/2020   K 4.9 07/24/2020   CL 95 (L) 07/24/2020   CREATININE 1.87 (H) 07/24/2020   BUN 56 (H) 07/24/2020   CO2 27 07/24/2020   TSH 1.20 01/22/2020   INR 1.11 01/15/2014   HGBA1C 9.5 Repeated and verified X2. (H) 07/17/2020   MICROALBUR 8.3 (H) 01/22/2020    Micro: none  Cardiac tracings I have personally interpreted today:  none  Pertinent Radiological findings (summarize): none   I spent total 34 minutes in caring for the patient for this visit:  1) by communicating with the patient and family/caregiver during the visit  2) by review of pertinent vital sign data, physical examination and labs as documented in the assessment and plan  3) by review of pertinent imaging - none today  4) by review of pertinent procedures - none today  5) by obtaining and reviewing separately obtained information from family/caretaker and Care Everywhere - none today  6) by ordering medications  7) by ordering tests  8) by documenting all of this clinical information in  the EHR including the management of each problem noted today in assessment and plan   Health Maintenance Due  Topic Date Due  . PAP SMEAR-Modifier  10/27/2012  . COVID-19 Vaccine (3 - Booster for Pfizer series) 04/18/2020    There are no preventive care reminders to display for this patient.  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[place])     Medium   Uncontrolled diabetes mellitus with hyperglycemia, with long-term current use of insulin ([place])     With reduced wt over the past yr, but suspect may need increased insulin - for a1c today      Relevant Orders   Hemoglobin A1c (Completed)   Lipid panel (Completed)   Hepatic function panel (Completed)   Basic metabolic panel (Completed)   PTH, intact and calcium (Completed)   VITAMIN D 25 Hydroxy (Vit-D Deficiency, Fractures) (Completed)   Phosphorus (Completed)   Hyperlipidemia    Lab Results  Component Value Date   LDLCALC 81 07/17/2020  stable overall by history and exam, recent data reviewed with pt, and pt to continue medical treatment as before,  to f/u any worsening symptoms or concerns       Essential hypertension    124/80 today - stable overall by history and exam, recent data reviewed with pt, and pt to continue medical treatment as before,  to f/u any worsening symptoms or concerns       CKD (chronic kidney disease), stage III ([place]) - Primary    Lab Results  Component Value Date   CREATININE 1.87 (H) 07/24/2020  stable overall by history and exam, recent data reviewed with pt, and pt to continue medical treatment as before,  to f/u any worsening symptoms or concerns       Relevant Orders   Hemoglobin A1c (Completed)   Lipid panel (Completed)   Hepatic function panel (Completed)   Basic metabolic panel (Completed)   PTH, intact and calcium (Completed)   VITAMIN D 25 Hydroxy (Vit-D Deficiency, Fractures) (Completed)   Phosphorus (Completed)    Other Visit Diagnoses    Flu vaccine need       Relevant Orders   Flu Vaccine QUAD High Dose(Fluad) (Completed)      No orders of the defined types were placed in this encounter.   Follow-up: Return in about 6 months (around 01/15/2021).   Cathlean Cower, MD 08/04/2020 1:50 PM [facility]

## 2020-08-05 ENCOUNTER — Encounter: Payer: Self-pay | Admitting: Internal Medicine

## 2020-08-08 DIAGNOSIS — M810 Age-related osteoporosis without current pathological fracture: Secondary | ICD-10-CM | POA: Diagnosis not present

## 2020-08-08 DIAGNOSIS — Z789 Other specified health status: Secondary | ICD-10-CM | POA: Diagnosis not present

## 2020-08-08 DIAGNOSIS — M899 Disorder of bone, unspecified: Secondary | ICD-10-CM | POA: Diagnosis not present

## 2020-08-08 DIAGNOSIS — M949 Disorder of cartilage, unspecified: Secondary | ICD-10-CM | POA: Diagnosis not present

## 2020-08-08 DIAGNOSIS — Z7409 Other reduced mobility: Secondary | ICD-10-CM | POA: Diagnosis not present

## 2020-08-12 DIAGNOSIS — I509 Heart failure, unspecified: Secondary | ICD-10-CM | POA: Diagnosis not present

## 2020-08-12 DIAGNOSIS — J4520 Mild intermittent asthma, uncomplicated: Secondary | ICD-10-CM | POA: Diagnosis not present

## 2020-08-26 ENCOUNTER — Other Ambulatory Visit: Payer: Self-pay | Admitting: Internal Medicine

## 2020-08-26 MED ORDER — TIZANIDINE HCL 2 MG PO TABS
2.0000 mg | ORAL_TABLET | Freq: Four times a day (QID) | ORAL | 2 refills | Status: DC | PRN
Start: 2020-08-26 — End: 2021-03-18

## 2020-09-08 DIAGNOSIS — M949 Disorder of cartilage, unspecified: Secondary | ICD-10-CM | POA: Diagnosis not present

## 2020-09-08 DIAGNOSIS — M899 Disorder of bone, unspecified: Secondary | ICD-10-CM | POA: Diagnosis not present

## 2020-09-08 DIAGNOSIS — Z7409 Other reduced mobility: Secondary | ICD-10-CM | POA: Diagnosis not present

## 2020-09-08 DIAGNOSIS — M810 Age-related osteoporosis without current pathological fracture: Secondary | ICD-10-CM | POA: Diagnosis not present

## 2020-09-08 DIAGNOSIS — Z789 Other specified health status: Secondary | ICD-10-CM | POA: Diagnosis not present

## 2020-09-12 DIAGNOSIS — I509 Heart failure, unspecified: Secondary | ICD-10-CM | POA: Diagnosis not present

## 2020-09-12 DIAGNOSIS — J4520 Mild intermittent asthma, uncomplicated: Secondary | ICD-10-CM | POA: Diagnosis not present

## 2020-10-03 ENCOUNTER — Ambulatory Visit: Payer: Medicare Other | Admitting: Neurology

## 2020-10-06 DIAGNOSIS — M949 Disorder of cartilage, unspecified: Secondary | ICD-10-CM | POA: Diagnosis not present

## 2020-10-06 DIAGNOSIS — M899 Disorder of bone, unspecified: Secondary | ICD-10-CM | POA: Diagnosis not present

## 2020-10-06 DIAGNOSIS — Z7409 Other reduced mobility: Secondary | ICD-10-CM | POA: Diagnosis not present

## 2020-10-06 DIAGNOSIS — Z789 Other specified health status: Secondary | ICD-10-CM | POA: Diagnosis not present

## 2020-10-06 DIAGNOSIS — M810 Age-related osteoporosis without current pathological fracture: Secondary | ICD-10-CM | POA: Diagnosis not present

## 2020-10-15 ENCOUNTER — Telehealth: Payer: Self-pay | Admitting: Internal Medicine

## 2020-10-15 NOTE — Telephone Encounter (Signed)
Almyra Free from [facility] has called to inform us that the patient has been discharged for noncompliance

## 2020-10-31 ENCOUNTER — Ambulatory Visit: Payer: Medicare Other | Admitting: Internal Medicine

## 2020-11-01 ENCOUNTER — Other Ambulatory Visit: Payer: Self-pay

## 2020-11-01 ENCOUNTER — Ambulatory Visit (INDEPENDENT_AMBULATORY_CARE_PROVIDER_SITE_OTHER): Payer: Medicare Other | Admitting: Internal Medicine

## 2020-11-01 VITALS — BP 120/70 | HR 63 | Temp 98.7°F | Ht 61.0 in

## 2020-11-01 DIAGNOSIS — E1165 Type 2 diabetes mellitus with hyperglycemia: Secondary | ICD-10-CM

## 2020-11-01 DIAGNOSIS — E538 Deficiency of other specified B group vitamins: Secondary | ICD-10-CM | POA: Diagnosis not present

## 2020-11-01 DIAGNOSIS — R269 Unspecified abnormalities of gait and mobility: Secondary | ICD-10-CM | POA: Diagnosis not present

## 2020-11-01 DIAGNOSIS — Z0001 Encounter for general adult medical examination with abnormal findings: Secondary | ICD-10-CM

## 2020-11-01 DIAGNOSIS — Z794 Long term (current) use of insulin: Secondary | ICD-10-CM | POA: Diagnosis not present

## 2020-11-01 DIAGNOSIS — E559 Vitamin D deficiency, unspecified: Secondary | ICD-10-CM | POA: Diagnosis not present

## 2020-11-01 DIAGNOSIS — F0391 Unspecified dementia with behavioral disturbance: Secondary | ICD-10-CM | POA: Diagnosis not present

## 2020-11-01 DIAGNOSIS — E119 Type 2 diabetes mellitus without complications: Secondary | ICD-10-CM | POA: Diagnosis not present

## 2020-11-01 DIAGNOSIS — N1831 Chronic kidney disease, stage 3a: Secondary | ICD-10-CM | POA: Diagnosis not present

## 2020-11-01 DIAGNOSIS — I10 Essential (primary) hypertension: Secondary | ICD-10-CM | POA: Diagnosis not present

## 2020-11-01 DIAGNOSIS — H9121 Sudden idiopathic hearing loss, right ear: Secondary | ICD-10-CM | POA: Diagnosis not present

## 2020-11-01 NOTE — Progress Notes (Signed)
Patient ID: Holly Weiss, female   DOB: 1941-07-28, 80 y.o.   MRN: [id number]         Chief Complaint:: wellness exam and Hearing Problem (Pt states that she is having trouble hearing out of right ear.) and Hand Pain (Pt states that her hands fell dry and her right hand is stiff)         HPI:  Holly Weiss is a 80 y.o. female here for wellness exam; declines hep c screen. O/w up to date with preventive referrals and immunizations.                         Also has worsening hearing on the right for several weeks not getting better, without HA, fever, tinnitus or drainage;.  Pt denies chest pain, increased sob or doe, wheezing, orthopnea, PND, increased LE swelling, palpitations, dizziness or syncope.   Pt denies polydipsia, polyuria,  Denies new worsening focal neuro s/s.   Pt denies fever, wt loss, night sweats, loss of appetite, or other constitutional symptoms  Family with her asks for wheelchair replacement as home has broken parts.  No other new complaints Dementia overall stable symptomatically, and not assoc with worsening behavioral changes Wt Readings from Last 3 Encounters:  07/24/20 140 lb (63.5 kg)  09/27/19 182 lb 15 oz (83 kg)  06/19/19 208 lb 9.6 oz (94.6 kg)   BP Readings from Last 3 Encounters:  11/01/20 120/70  07/25/20 (!) 129/103  07/17/20 124/80   Immunization History  Administered Date(s) Administered  . Fluad Quad(high Dose 65+) 06/19/2019, 07/17/2020  . Influenza Split 07/21/2011  . Influenza Whole 07/02/2006, 05/26/2007, 08/28/2009, 06/10/2010  . Influenza, High Dose Seasonal PF 05/07/2015, 09/24/2016, 05/14/2017, 07/21/2018  . Influenza, Seasonal, Injecte, Preservative Fre 09/06/2012  . Influenza,inj,Quad PF,6+ Mos 07/15/2013, 07/20/2014  . Influenza,inj,quad, With Preservative 07/08/2014  . Influenza-Unspecified 06/04/2015  . PFIZER(Purple Top)SARS-COV-2 Vaccination 09/23/2019, 10/17/2019, 05/06/2020  . Pneumococcal Conjugate-13 02/21/2014  . Pneumococcal  Polysaccharide-23 08/01/2007, 07/15/2013  . Td 08/23/2008  . Tdap 11/02/2018  . Zoster 04/16/2008   There are no preventive care reminders to display for this patient.    Past Medical History:  Diagnosis Date  . ANEMIA-NOS 01/18/2008  . Arthritis   . 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 ASTHMA, WITH ACUTE EXACERBATION 11/29/2008  . Bilateral carpal tunnel syndrome   . Chronic pain syndrome 01/21/2010  . Dementia ([place])   . Depression   . DIABETIC  RETINOPATHY 05/30/2007  . DM, UNCOMPLICATED, TYPE II, UNCONTROLLED 05/30/2007  . Edema of both legs    takes Lasix  . Femur fracture, right ([place])   . GERD (gastroesophageal reflux disease)   . 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 History of blood transfusion    spine surgery  . History of kidney stones   . HYPERLIPIDEMIA 05/30/2007  . HYPERTENSION 10/31/2007  . LOW BACK PAIN 01/18/2008  . Migraine headache   . MORBID OBESITY, HX OF 05/30/2007  . NEPHROLITHIASIS, HX OF 01/18/2008  . Numbness and tingling in hands   . Seasonal allergies   . Shortness of breath    with walking  . SHOULDER PAIN, BILATERAL 04/09/2009  . Spinal stenosis   . Urgency of urination    Past Surgical History:  Procedure Laterality Date  . BACK SURGERY    . CATARACT EXTRACTION    . CHOLECYSTECTOMY    . lumbar disc surgury    . ORIF TIBIA FRACTURE Right 01/16/2014   Procedure: RIGHT TIBIA  REPAIR NON-UNION/MALUNION TIBIA WITH SLIDING GRAFT;  Surgeon: Renette Butters, MD;  Location: [facility];  Service: Orthopedics;  Laterality: Right;  . THORACIC FUSION    . TOTAL KNEE ARTHROPLASTY WITH REVISION COMPONENTS Right 07/12/2013   Procedure: TOTAL KNEE ARTHROPLASTY WITH TIBIA REVISION COMPONENTS;  Surgeon: Ninetta Lights, MD;  Location: [facility];  Service: Orthopedics;  Laterality: Right;  . TUBAL LIGATION      reports that she has never smoked. She uses smokeless tobacco. She reports that she does not drink alcohol and does not use drugs. family history includes Dementia in her  mother; Depression in her sister; Diabetes in her mother and sister; Heart disease in her father and mother; Lung cancer in her father. Allergies  Allergen Reactions  . Adhesive [Tape] Itching  . Endocet [Oxycodone-Acetaminophen] Itching  . Latex Itching  . Oxycodone-Acetaminophen Itching   Current Outpatient Medications on File Prior to Visit  Medication Sig Dispense Refill  . albuterol (VENTOLIN HFA) 108 (90 Base) MCG/ACT inhaler INHALE 2 PUFFS INTO THE LUNGS TWICE DAILY AS NEEDED FOR WHEEZING OR SHORTNESS OF BREATH (Patient taking differently: Inhale 2 puffs into the lungs 2 (two) times daily as needed for wheezing or shortness of breath.) 18 g 3  . Alcohol Swabs (ALCOHOL WIPES) 70 % PADS Apply topically.    Marland Weiss aspirin 81 MG tablet Take 1 tablet (81 mg total) by mouth daily. 30 tablet 0  . Blood Glucose Monitoring Suppl ([place]) w/Device KIT Use as directed once daily to check blood sugar.  Diagnosis code 250.02 1 each 0  . Calcium Carbonate-Vitamin D (CALTRATE 600+D PO) Take 2 tablets by mouth daily.    . Continuous Blood Gluc Receiver (FREESTYLE LIBRE 14 DAY READER) DEVI Apply 1 Device topically daily. Use as directed daily E11.9 1 each 0  . Continuous Blood Gluc Sensor (FREESTYLE LIBRE 14 DAY SENSOR) MISC Apply 1 Device topically every 14 (fourteen) days. E11.9 6 each 3  . furosemide (LASIX) 40 MG tablet TAKE 1 TABLET BY MOUTH EVERY DAY 90 tablet 3  . glucose blood (ONE TOUCH TEST STRIPS) test strip Use as directed once daily to check blood sugar.  Diagnosis code E11.9 100 each 11  . HUMALOG KWIKPEN 100 UNIT/ML KwikPen SMARTSIG:15 Unit(s) SUB-Q Every Evening    . insulin glargine (LANTUS SOLOSTAR) 100 UNIT/ML Solostar Pen Inject 20 Units into the skin daily. 15 mL 11  . Insulin Pen Needle (B-D ULTRAFINE III SHORT PEN) 31G X 8 MM MISC USE TO CHECK BLOOD SUGERS TWICE DAILY 100 each 3  . melatonin 3 MG TABS tablet Take by mouth.    . metoprolol succinate (TOPROL-XL) 100 MG 24  hr tablet TAKE 1 TABLET BY MOUTH DAILY WITH OR IMMEDIATELY FOLLOWING A MEAL 90 tablet 3  . Multiple Vitamins-Minerals (ONE-A-DAY EXTRAS ANTIOXIDANT PO) Take 1 tablet by mouth daily.    . ondansetron (ZOFRAN) 4 MG tablet TAKE 1 TABLET(4 MG) BY MOUTH EVERY 8 HOURS AS NEEDED FOR NAUSEA OR VOMITING 20 tablet 0  . ONE TOUCH LANCETS MISC Use as directed once daily to check blood sugar. DX E11.09 100 each 3  . QUEtiapine (SEROQUEL) 100 MG tablet TAKE 1 TABLET(100 MG) BY MOUTH AT BEDTIME 90 tablet 1  . rosuvastatin (CRESTOR) 20 MG tablet TAKE 1/2 TABLET BY MOUTH EVERY DAY 90 tablet 1  . tiZANidine (ZANAFLEX) 2 MG tablet Take 1 tablet (2 mg total) by mouth every 6 (six) hours as needed for muscle spasms. 60 tablet 2  .  traMADol (ULTRAM) 50 MG tablet TAKE 1 TABLET(50 MG) BY MOUTH EVERY 6 HOURS AS NEEDED 30 tablet 2   No current facility-administered medications on file prior to visit.        ROS:  All others reviewed and negative.  Objective        PE:  BP 120/70 (BP Location: Left Arm, Patient Position: Sitting, Cuff Size: Large)   Pulse 63   Temp 98.7 F (37.1 C) (Oral)   Ht 5' 1"  (1.549 m)   SpO2 97%   BMI 26.45 kg/m                 Constitutional: Pt appears in NAD               HENT: Head: NCAT.                Right Ear: External ear normal.                 Left Ear: External ear normal.                Eyes: . Pupils are equal, round, and reactive to light. Conjunctivae and EOM are normal;  Right ear canal irrigated of wax impaction and hearing improved               Nose: without d/c or deformity               Neck: Neck supple. Gross normal ROM               Cardiovascular: Normal rate and regular rhythm.                 Pulmonary/Chest: Effort normal and breath sounds without rales or wheezing.                Abd:  Soft, NT, ND, + BS, no organomegaly               Neurological: Pt is alert. At baseline orientation, motor grossly intact               Skin: Skin is warm. No rashes, no  other new lesions, LE edema - trace bilat               Psychiatric: Pt behavior is normal without agitation   Micro: none  Cardiac tracings I have personally interpreted today:  none  Pertinent Radiological findings (summarize): none   Lab Results  Component Value Date   WBC 5.8 11/01/2020   HGB 10.8 (L) 11/01/2020   HCT 32.8 (L) 11/01/2020   PLT 146 11/01/2020   GLUCOSE 176 (H) 11/01/2020   CHOL 144 11/01/2020   TRIG 96 11/01/2020   HDL 39 (L) 11/01/2020   LDLCALC 86 11/01/2020   ALT 15 11/01/2020   AST 17 11/01/2020   NA 142 11/01/2020   K 4.5 11/01/2020   CL 109 11/01/2020   CREATININE 1.13 (H) 11/01/2020   BUN 35 (H) 11/01/2020   CO2 23 11/01/2020   TSH 1.19 11/01/2020   INR 1.11 01/15/2014   HGBA1C 11.3 (H) 11/01/2020   MICROALBUR 4.6 11/01/2020   Assessment/Plan:  Holly Weiss is a 79 y.o. Black or African American [2] female with  has a past medical history of ANEMIA-NOS (01/18/2008), Arthritis, ARTHRITIS, GENERALIZED (08/18/2010), ASTHMA, WITH ACUTE EXACERBATION (11/29/2008), Bilateral carpal tunnel syndrome, Chronic pain syndrome (01/21/2010), Dementia ([place]), Depression, DIABETIC  RETINOPATHY (51/09/5850), DM, UNCOMPLICATED, TYPE II, UNCONTROLLED (05/30/2007), Edema of both legs,  Femur fracture, right ([place]), GERD (gastroesophageal reflux disease), GLAUCOMA NOS (05/30/2007), History of blood transfusion, History of kidney stones, HYPERLIPIDEMIA (05/30/2007), HYPERTENSION (10/31/2007), LOW BACK PAIN (01/18/2008), Migraine headache, MORBID OBESITY, HX OF (05/30/2007), NEPHROLITHIASIS, HX OF (01/18/2008), Numbness and tingling in hands, Seasonal allergies, Shortness of breath, SHOULDER PAIN, BILATERAL (04/09/2009), Spinal stenosis, and Urgency of urination.  Encounter for well adult exam with abnormal findings Age and sex appropriate education and counseling updated with regular exercise and diet Referrals for preventative services - declines hep c screen Immunizations addressed -  none needed Smoking counseling  - none needed Evidence for depression or other mood disorder - none significant Most recent labs reviewed. I have personally reviewed and have noted: 1) the patient's medical and social history 2) The patient's current medications and supplements 3) The patient's height, weight, and BMI have been recorded in the chart   CKD (chronic kidney disease), stage III Lab Results  Component Value Date   CREATININE 1.13 (H) 11/01/2020   Stable overall, cont to avoid nephrotoxins  Uncontrolled diabetes mellitus with hyperglycemia, with long-term current use of insulin (HCC)  Stable symptoms, pt to continue current medical treatment , check A1c, cont humalog and lantus  Current Outpatient Medications (Endocrine & Metabolic):  Marland Weiss  HUMALOG KWIKPEN 100 UNIT/ML KwikPen, SMARTSIG:15 Unit(s) SUB-Q Every Evening .  insulin glargine (LANTUS SOLOSTAR) 100 UNIT/ML Solostar Pen, Inject 20 Units into the skin daily.  Current Outpatient Medications (Cardiovascular):  .  furosemide (LASIX) 40 MG tablet, TAKE 1 TABLET BY MOUTH EVERY DAY .  metoprolol succinate (TOPROL-XL) 100 MG 24 hr tablet, TAKE 1 TABLET BY MOUTH DAILY WITH OR IMMEDIATELY FOLLOWING A MEAL .  rosuvastatin (CRESTOR) 20 MG tablet, TAKE 1/2 TABLET BY MOUTH EVERY DAY  Current Outpatient Medications (Respiratory):  .  albuterol (VENTOLIN HFA) 108 (90 Base) MCG/ACT inhaler, INHALE 2 PUFFS INTO THE LUNGS TWICE DAILY AS NEEDED FOR WHEEZING OR SHORTNESS OF BREATH (Patient taking differently: Inhale 2 puffs into the lungs 2 (two) times daily as needed for wheezing or shortness of breath.)  Current Outpatient Medications (Analgesics):  .  aspirin 81 MG tablet, Take 1 tablet (81 mg total) by mouth daily. .  traMADol (ULTRAM) 50 MG tablet, TAKE 1 TABLET(50 MG) BY MOUTH EVERY 6 HOURS AS NEEDED   Current Outpatient Medications (Other):  Marland Weiss  Alcohol Swabs (ALCOHOL WIPES) 70 % PADS, Apply topically. .  Blood Glucose  Monitoring Suppl ([place]) w/Device KIT, Use as directed once daily to check blood sugar.  Diagnosis code 250.02 .  Calcium Carbonate-Vitamin D (CALTRATE 600+D PO), Take 2 tablets by mouth daily. .  Continuous Blood Gluc Receiver (FREESTYLE LIBRE 14 DAY READER) DEVI, Apply 1 Device topically daily. Use as directed daily E11.9 .  Continuous Blood Gluc Sensor (FREESTYLE LIBRE 14 DAY SENSOR) MISC, Apply 1 Device topically every 14 (fourteen) days. E11.9 .  glucose blood (ONE TOUCH TEST STRIPS) test strip, Use as directed once daily to check blood sugar.  Diagnosis code E11.9 .  Insulin Pen Needle (B-D ULTRAFINE III SHORT PEN) 31G X 8 MM MISC, USE TO CHECK BLOOD SUGERS TWICE DAILY .  melatonin 3 MG TABS tablet, Take by mouth. .  Multiple Vitamins-Minerals (ONE-A-DAY EXTRAS ANTIOXIDANT PO), Take 1 tablet by mouth daily. .  ondansetron (ZOFRAN) 4 MG tablet, TAKE 1 TABLET(4 MG) BY MOUTH EVERY 8 HOURS AS NEEDED FOR NAUSEA OR VOMITING .  ONE TOUCH LANCETS MISC, Use as directed once daily to check blood sugar. DX E11.09 .  QUEtiapine (SEROQUEL) 100 MG tablet, TAKE 1 TABLET(100 MG) BY MOUTH AT BEDTIME .  tiZANidine (ZANAFLEX) 2 MG tablet, Take 1 tablet (2 mg total) by mouth every 6 (six) hours as needed for muscle spasms.   Essential hypertension BP Readings from Last 3 Encounters:  11/01/20 120/70  07/25/20 (!) 129/103  07/17/20 124/80   Stable, pt to continue medical treatment toprol   Gait disorder Chronic stable, but needs new DME  - manual wheelchair replacement  Dementia ([place]) O/w stable, to f/u any worsening symptoms or concerns, continue seroquel  Sudden right hearing loss Improved, s/p right ear irrigation  Followup: Return in about 6 months (around 05/03/2021).  Cathlean Cower, MD 11/02/2020 3:47 PM [facility]

## 2020-11-01 NOTE — Patient Instructions (Signed)
Your right ear was cleared of wax today  You are given the prescription for the wheel chair today, and this can be taken to a medical supply store such as [facility]  Please continue all other medications as before, and refills have been done if requested.  Please have the pharmacy call with any other refills you may need.  Please continue your efforts at being more active, low cholesterol diet, and weight control.  You are otherwise up to date with prevention measures today.  Please keep your appointments with your specialists as you may have planned  Please go to the LAB at the blood drawing area for the tests to be done  You will be contacted by phone if any changes need to be made immediately.  Otherwise, you will receive a letter about your results with an explanation, but please check with MyChart first.  Please remember to sign up for MyChart if you have not done so, as this will be important to you in the future with finding out test results, communicating by private email, and scheduling acute appointments online when needed.  Please make an Appointment to return in 6 months, or sooner if needed

## 2020-11-02 ENCOUNTER — Encounter: Payer: Self-pay | Admitting: Internal Medicine

## 2020-11-02 LAB — HEPATIC FUNCTION PANEL
AG Ratio: 1 (calc) (ref 1.0–2.5)
ALT: 15 U/L (ref 6–29)
AST: 17 U/L (ref 10–35)
Albumin: 3.5 g/dL — ABNORMAL LOW (ref 3.6–5.1)
Alkaline phosphatase (APISO): 79 U/L (ref 37–153)
Bilirubin, Direct: 0.1 mg/dL (ref 0.0–0.2)
Globulin: 3.5 g/dL (calc) (ref 1.9–3.7)
Indirect Bilirubin: 0.2 mg/dL (calc) (ref 0.2–1.2)
Total Bilirubin: 0.3 mg/dL (ref 0.2–1.2)
Total Protein: 7 g/dL (ref 6.1–8.1)

## 2020-11-02 LAB — MICROSCOPIC MESSAGE

## 2020-11-02 LAB — CBC WITH DIFFERENTIAL/PLATELET
Absolute Monocytes: 510 cells/uL (ref 200–950)
Basophils Absolute: 41 cells/uL (ref 0–200)
Basophils Relative: 0.7 %
Eosinophils Absolute: 290 cells/uL (ref 15–500)
Eosinophils Relative: 5 %
HCT: 32.8 % — ABNORMAL LOW (ref 35.0–45.0)
Hemoglobin: 10.8 g/dL — ABNORMAL LOW (ref 11.7–15.5)
Lymphs Abs: 1798 cells/uL (ref 850–3900)
MCH: 30 pg (ref 27.0–33.0)
MCHC: 32.9 g/dL (ref 32.0–36.0)
MCV: 91.1 fL (ref 80.0–100.0)
MPV: 12.2 fL (ref 7.5–12.5)
Monocytes Relative: 8.8 %
Neutro Abs: 3161 cells/uL (ref 1500–7800)
Neutrophils Relative %: 54.5 %
Platelets: 146 10*3/uL (ref 140–400)
RBC: 3.6 10*6/uL — ABNORMAL LOW (ref 3.80–5.10)
RDW: 14 % (ref 11.0–15.0)
Total Lymphocyte: 31 %
WBC: 5.8 10*3/uL (ref 3.8–10.8)

## 2020-11-02 LAB — LIPID PANEL
Cholesterol: 144 mg/dL (ref <200)
HDL: 39 mg/dL — ABNORMAL LOW (ref >=50)
LDL Cholesterol (Calc): 86 mg/dL (calc)
Non-HDL Cholesterol (Calc): 105 mg/dL (calc) (ref <130)
Total CHOL/HDL Ratio: 3.7 (calc) (ref <5.0)
Triglycerides: 96 mg/dL (ref <150)

## 2020-11-02 LAB — URINALYSIS, ROUTINE W REFLEX MICROSCOPIC
Bilirubin Urine: NEGATIVE
Hgb urine dipstick: NEGATIVE
Ketones, ur: NEGATIVE
Nitrite: NEGATIVE
RBC / HPF: NONE SEEN /HPF (ref 0–2)
Specific Gravity, Urine: 1.022 (ref 1.001–1.03)
pH: <=5 (ref 5.0–8.0)

## 2020-11-02 LAB — BASIC METABOLIC PANEL
BUN/Creatinine Ratio: 31 (calc) — ABNORMAL HIGH (ref 6–22)
BUN: 35 mg/dL — ABNORMAL HIGH (ref 7–25)
CO2: 23 mmol/L (ref 20–32)
Calcium: 9.7 mg/dL (ref 8.6–10.4)
Chloride: 109 mmol/L (ref 98–110)
Creat: 1.13 mg/dL — ABNORMAL HIGH (ref 0.60–0.93)
Glucose, Bld: 176 mg/dL — ABNORMAL HIGH (ref 65–99)
Potassium: 4.5 mmol/L (ref 3.5–5.3)
Sodium: 142 mmol/L (ref 135–146)

## 2020-11-02 LAB — VITAMIN D 25 HYDROXY (VIT D DEFICIENCY, FRACTURES): Vit D, 25-Hydroxy: 56 ng/mL (ref 30–100)

## 2020-11-02 LAB — MICROALBUMIN / CREATININE URINE RATIO
Creatinine, Urine: 104 mg/dL (ref 20–275)
Microalb Creat Ratio: 44 mcg/mg creat — ABNORMAL HIGH (ref <30)
Microalb, Ur: 4.6 mg/dL

## 2020-11-02 LAB — VITAMIN B12: Vitamin B-12: 726 pg/mL (ref 200–1100)

## 2020-11-02 LAB — HEMOGLOBIN A1C
Hgb A1c MFr Bld: 11.3 % of total Hgb — ABNORMAL HIGH (ref <5.7)
Mean Plasma Glucose: 278 mg/dL
eAG (mmol/L): 15.4 mmol/L

## 2020-11-02 LAB — TSH: TSH: 1.19 mIU/L (ref 0.40–4.50)

## 2020-11-02 NOTE — Assessment & Plan Note (Signed)
  Stable symptoms, pt to continue current medical treatment , check A1c, cont humalog and lantus  Current Outpatient Medications (Endocrine & Metabolic):  Marland Kitchen  HUMALOG KWIKPEN 100 UNIT/ML KwikPen, SMARTSIG:15 Unit(s) SUB-Q Every Evening .  insulin glargine (LANTUS SOLOSTAR) 100 UNIT/ML Solostar Pen, Inject 20 Units into the skin daily.  Current Outpatient Medications (Cardiovascular):  .  furosemide (LASIX) 40 MG tablet, TAKE 1 TABLET BY MOUTH EVERY DAY .  metoprolol succinate (TOPROL-XL) 100 MG 24 hr tablet, TAKE 1 TABLET BY MOUTH DAILY WITH OR IMMEDIATELY FOLLOWING A MEAL .  rosuvastatin (CRESTOR) 20 MG tablet, TAKE 1/2 TABLET BY MOUTH EVERY DAY  Current Outpatient Medications (Respiratory):  .  albuterol (VENTOLIN HFA) 108 (90 Base) MCG/ACT inhaler, INHALE 2 PUFFS INTO THE LUNGS TWICE DAILY AS NEEDED FOR WHEEZING OR SHORTNESS OF BREATH (Patient taking differently: Inhale 2 puffs into the lungs 2 (two) times daily as needed for wheezing or shortness of breath.)  Current Outpatient Medications (Analgesics):  .  aspirin 81 MG tablet, Take 1 tablet (81 mg total) by mouth daily. .  traMADol (ULTRAM) 50 MG tablet, TAKE 1 TABLET(50 MG) BY MOUTH EVERY 6 HOURS AS NEEDED   Current Outpatient Medications (Other):  Marland Kitchen  Alcohol Swabs (ALCOHOL WIPES) 70 % PADS, Apply topically. .  Blood Glucose Monitoring Suppl ([place]) w/Device KIT, Use as directed once daily to check blood sugar.  Diagnosis code 250.02 .  Calcium Carbonate-Vitamin D (CALTRATE 600+D PO), Take 2 tablets by mouth daily. .  Continuous Blood Gluc Receiver (FREESTYLE LIBRE 14 DAY READER) DEVI, Apply 1 Device topically daily. Use as directed daily E11.9 .  Continuous Blood Gluc Sensor (FREESTYLE LIBRE 14 DAY SENSOR) MISC, Apply 1 Device topically every 14 (fourteen) days. E11.9 .  glucose blood (ONE TOUCH TEST STRIPS) test strip, Use as directed once daily to check blood sugar.  Diagnosis code E11.9 .  Insulin Pen Needle (B-D  ULTRAFINE III SHORT PEN) 31G X 8 MM MISC, USE TO CHECK BLOOD SUGERS TWICE DAILY .  melatonin 3 MG TABS tablet, Take by mouth. .  Multiple Vitamins-Minerals (ONE-A-DAY EXTRAS ANTIOXIDANT PO), Take 1 tablet by mouth daily. .  ondansetron (ZOFRAN) 4 MG tablet, TAKE 1 TABLET(4 MG) BY MOUTH EVERY 8 HOURS AS NEEDED FOR NAUSEA OR VOMITING .  ONE TOUCH LANCETS MISC, Use as directed once daily to check blood sugar. DX E11.09 .  QUEtiapine (SEROQUEL) 100 MG tablet, TAKE 1 TABLET(100 MG) BY MOUTH AT BEDTIME .  tiZANidine (ZANAFLEX) 2 MG tablet, Take 1 tablet (2 mg total) by mouth every 6 (six) hours as needed for muscle spasms.

## 2020-11-02 NOTE — Assessment & Plan Note (Signed)
Age and sex appropriate education and counseling updated with regular exercise and diet Referrals for preventative services - declines hep c screen Immunizations addressed - none needed Smoking counseling  - none needed Evidence for depression or other mood disorder - none significant Most recent labs reviewed. I have personally reviewed and have noted: 1) the patient's medical and social history 2) The patient's current medications and supplements 3) The patient's height, weight, and BMI have been recorded in the chart

## 2020-11-02 NOTE — Assessment & Plan Note (Signed)
Chronic stable, but needs new DME  - manual wheelchair replacement

## 2020-11-02 NOTE — Assessment & Plan Note (Signed)
O/w stable, to f/u any worsening symptoms or concerns, continue seroquel

## 2020-11-02 NOTE — Assessment & Plan Note (Signed)
Improved, s/p right ear irrigation

## 2020-11-02 NOTE — Assessment & Plan Note (Signed)
Lab Results  Component Value Date   CREATININE 1.13 (H) 11/01/2020   Stable overall, cont to avoid nephrotoxins

## 2020-11-02 NOTE — Assessment & Plan Note (Signed)
BP Readings from Last 3 Encounters:  11/01/20 120/70  07/25/20 (!) 129/103  07/17/20 124/80   Stable, pt to continue medical treatment toprol

## 2020-11-05 ENCOUNTER — Encounter: Payer: Self-pay | Admitting: Internal Medicine

## 2020-11-06 DIAGNOSIS — M899 Disorder of bone, unspecified: Secondary | ICD-10-CM | POA: Diagnosis not present

## 2020-11-06 DIAGNOSIS — Z7409 Other reduced mobility: Secondary | ICD-10-CM | POA: Diagnosis not present

## 2020-11-06 DIAGNOSIS — Z789 Other specified health status: Secondary | ICD-10-CM | POA: Diagnosis not present

## 2020-11-06 DIAGNOSIS — M949 Disorder of cartilage, unspecified: Secondary | ICD-10-CM | POA: Diagnosis not present

## 2020-11-06 DIAGNOSIS — M810 Age-related osteoporosis without current pathological fracture: Secondary | ICD-10-CM | POA: Diagnosis not present

## 2020-11-13 ENCOUNTER — Other Ambulatory Visit: Payer: Self-pay

## 2020-12-11 ENCOUNTER — Other Ambulatory Visit: Payer: Self-pay | Admitting: Internal Medicine

## 2020-12-23 ENCOUNTER — Telehealth: Payer: Self-pay | Admitting: Internal Medicine

## 2020-12-23 NOTE — Telephone Encounter (Signed)
[facility] FYI:  --Caller states that her mother is having severe leg pain. Sometimes in both legs, also has swelling  Advised- see PCP within 4 hours, if closed urgent care  Patient has an upcoming appointment on 5.25.22

## 2020-12-25 ENCOUNTER — Encounter: Payer: Self-pay | Admitting: Internal Medicine

## 2020-12-25 ENCOUNTER — Ambulatory Visit: Payer: Medicare Other | Admitting: Internal Medicine

## 2020-12-25 DIAGNOSIS — E1165 Type 2 diabetes mellitus with hyperglycemia: Secondary | ICD-10-CM

## 2020-12-25 DIAGNOSIS — S81802A Unspecified open wound, left lower leg, initial encounter: Secondary | ICD-10-CM

## 2020-12-31 ENCOUNTER — Telehealth: Payer: Self-pay | Admitting: Internal Medicine

## 2020-12-31 NOTE — Telephone Encounter (Signed)
[facility] FYI:  --Caller states her mother has fallen and is experiencing leg pain. Caller notes left leg is leaking pus (yellow,white fluid) and itching.  Advised to go to ED now

## 2021-01-01 ENCOUNTER — Ambulatory Visit (INDEPENDENT_AMBULATORY_CARE_PROVIDER_SITE_OTHER): Payer: Medicare Other | Admitting: Internal Medicine

## 2021-01-01 ENCOUNTER — Other Ambulatory Visit: Payer: Self-pay

## 2021-01-01 ENCOUNTER — Encounter: Payer: Self-pay | Admitting: Internal Medicine

## 2021-01-01 VITALS — BP 110/78 | HR 59 | Temp 98.9°F

## 2021-01-01 DIAGNOSIS — L03116 Cellulitis of left lower limb: Secondary | ICD-10-CM | POA: Diagnosis not present

## 2021-01-01 DIAGNOSIS — Z794 Long term (current) use of insulin: Secondary | ICD-10-CM

## 2021-01-01 DIAGNOSIS — S81802A Unspecified open wound, left lower leg, initial encounter: Secondary | ICD-10-CM | POA: Diagnosis not present

## 2021-01-01 DIAGNOSIS — E1165 Type 2 diabetes mellitus with hyperglycemia: Secondary | ICD-10-CM

## 2021-01-01 DIAGNOSIS — F419 Anxiety disorder, unspecified: Secondary | ICD-10-CM

## 2021-01-01 DIAGNOSIS — L89300 Pressure ulcer of unspecified buttock, unstageable: Secondary | ICD-10-CM | POA: Diagnosis not present

## 2021-01-01 DIAGNOSIS — I5043 Acute on chronic combined systolic (congestive) and diastolic (congestive) heart failure: Secondary | ICD-10-CM | POA: Diagnosis not present

## 2021-01-01 DIAGNOSIS — F32A Depression, unspecified: Secondary | ICD-10-CM | POA: Diagnosis not present

## 2021-01-01 DIAGNOSIS — L89309 Pressure ulcer of unspecified buttock, unspecified stage: Secondary | ICD-10-CM | POA: Insufficient documentation

## 2021-01-01 MED ORDER — CITALOPRAM HYDROBROMIDE 10 MG PO TABS: 10.0000 mg | ORAL_TABLET | Freq: Every day | ORAL | 3 refills | Status: AC

## 2021-01-01 MED ORDER — CEFTRIAXONE SODIUM 1 G IJ SOLR
1.0000 g | Freq: Once | INTRAMUSCULAR | Status: AC
  Administered 2021-01-01: 1 g via INTRAMUSCULAR

## 2021-01-01 MED ORDER — FUROSEMIDE 40 MG PO TABS: ORAL_TABLET | ORAL | 3 refills | Status: AC

## 2021-01-01 MED ORDER — QUETIAPINE FUMARATE 100 MG PO TABS: 50.0000 mg | ORAL_TABLET | Freq: Every day | ORAL | 1 refills | Status: DC

## 2021-01-01 MED ORDER — LANTUS SOLOSTAR 100 UNIT/ML ~~LOC~~ SOPN: 30.0000 [IU] | PEN_INJECTOR | Freq: Every day | SUBCUTANEOUS | 11 refills | Status: DC

## 2021-01-01 MED ORDER — DOXYCYCLINE HYCLATE 100 MG PO TABS: 100.0000 mg | ORAL_TABLET | Freq: Two times a day (BID) | ORAL | 0 refills | Status: DC

## 2021-01-01 NOTE — Progress Notes (Signed)
Patient ID: Holly Weiss, female   DOB: Jun 10, 1941, 80 y.o.   MRN: [id number]        Chief Complaint: worsening leg swelling       HPI:  Holly Weiss is a 80 y.o. female here with familly who reports they have halved the patient lasix for several weeks due to urinary frequency and difficulty getting back and forth to the bathroom.  Leg sweling has increased, and worsening more especially in the past wk, with pt sleeping nightly in a recliner with legs more dependent.  The LLE in past few days is worse than right in that a venous ulcer has developed above the ankle, with weepiness surrounding and an area of redness tender near as well.  No high fever, chills but worsening generalized weakness.  Also Denies worsening depressive symptoms, suicidal ideation, or panic; has ongoing anxiety, worsening recently, asking for new tx.  Also only taking 20 units lantus and cbg's are chronically elevated, has not f/u with endo recent.           Wt Readings from Last 3 Encounters:  07/24/20 140 lb (63.5 kg)  09/27/19 182 lb 15 oz (83 kg)  06/19/19 208 lb 9.6 oz (94.6 kg)   BP Readings from Last 3 Encounters:  01/01/21 110/78  11/01/20 120/70  07/25/20 (!) 129/103         Past Medical History:  Diagnosis Date  . ANEMIA-NOS 01/18/2008  . Arthritis   . 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 ASTHMA, WITH ACUTE EXACERBATION 11/29/2008  . Bilateral carpal tunnel syndrome   . Chronic pain syndrome 01/21/2010  . Dementia ([place])   . Depression   . DIABETIC  RETINOPATHY 05/30/2007  . DM, UNCOMPLICATED, TYPE II, UNCONTROLLED 05/30/2007  . Edema of both legs    takes Lasix  . Femur fracture, right ([place])   . GERD (gastroesophageal reflux disease)   . 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 History of blood transfusion    spine surgery  . History of kidney stones   . HYPERLIPIDEMIA 05/30/2007  . HYPERTENSION 10/31/2007  . LOW BACK PAIN 01/18/2008  . Migraine headache   . MORBID OBESITY, HX OF 05/30/2007  . NEPHROLITHIASIS, HX  OF 01/18/2008  . Numbness and tingling in hands   . Seasonal allergies   . Shortness of breath    with walking  . SHOULDER PAIN, BILATERAL 04/09/2009  . Spinal stenosis   . Urgency of urination    Past Surgical History:  Procedure Laterality Date  . BACK SURGERY    . CATARACT EXTRACTION    . CHOLECYSTECTOMY    . lumbar disc surgury    . ORIF TIBIA FRACTURE Right 01/16/2014   Procedure: RIGHT TIBIA REPAIR NON-UNION/MALUNION TIBIA WITH SLIDING GRAFT;  Surgeon: Renette Butters, MD;  Location: [facility];  Service: Orthopedics;  Laterality: Right;  . THORACIC FUSION    . TOTAL KNEE ARTHROPLASTY WITH REVISION COMPONENTS Right 07/12/2013   Procedure: TOTAL KNEE ARTHROPLASTY WITH TIBIA REVISION COMPONENTS;  Surgeon: Ninetta Lights, MD;  Location: [facility];  Service: Orthopedics;  Laterality: Right;  . TUBAL LIGATION      reports that she has never smoked. She uses smokeless tobacco. She reports that she does not drink alcohol and does not use drugs. family history includes Dementia in her mother; Depression in her sister; Diabetes in her mother and sister; Heart disease in her father and mother; Lung cancer in her father. Allergies  Allergen Reactions  . Adhesive [Tape] Itching  .  Endocet [Oxycodone-Acetaminophen] Itching  . Latex Itching  . Oxycodone-Acetaminophen Itching   Current Outpatient Medications on File Prior to Visit  Medication Sig Dispense Refill  . albuterol (VENTOLIN HFA) 108 (90 Base) MCG/ACT inhaler INHALE 2 PUFFS INTO THE LUNGS TWICE DAILY AS NEEDED FOR WHEEZING OR SHORTNESS OF BREATH (Patient taking differently: Inhale 2 puffs into the lungs 2 (two) times daily as needed for wheezing or shortness of breath.) 18 g 3  . Alcohol Swabs (ALCOHOL WIPES) 70 % PADS Apply topically.    Marland Kitchen aspirin 81 MG tablet Take 1 tablet (81 mg total) by mouth daily. 30 tablet 0  . Blood Glucose Monitoring Suppl ([place]) w/Device KIT Use as directed once daily to check blood sugar.   Diagnosis code 250.02 1 each 0  . Calcium Carbonate-Vitamin D (CALTRATE 600+D PO) Take 2 tablets by mouth daily.    . Continuous Blood Gluc Receiver (FREESTYLE LIBRE 14 DAY READER) DEVI Apply 1 Device topically daily. Use as directed daily E11.9 1 each 0  . Continuous Blood Gluc Sensor (FREESTYLE LIBRE 14 DAY SENSOR) MISC Apply 1 Device topically every 14 (fourteen) days. E11.9 6 each 3  . glucose blood (ONE TOUCH TEST STRIPS) test strip Use as directed once daily to check blood sugar.  Diagnosis code E11.9 100 each 11  . HUMALOG KWIKPEN 100 UNIT/ML KwikPen SMARTSIG:15 Unit(s) SUB-Q Every Evening    . Insulin Pen Needle (B-D ULTRAFINE III SHORT PEN) 31G X 8 MM MISC USE TO CHECK BLOOD SUGERS TWICE DAILY 100 each 3  . melatonin 3 MG TABS tablet Take by mouth.    . metoprolol succinate (TOPROL-XL) 100 MG 24 hr tablet TAKE 1 TABLET BY MOUTH DAILY WITH OR IMMEDIATELY FOLLOWING A MEAL 90 tablet 3  . Multiple Vitamins-Minerals (ONE-A-DAY EXTRAS ANTIOXIDANT PO) Take 1 tablet by mouth daily.    . ondansetron (ZOFRAN) 4 MG tablet TAKE 1 TABLET(4 MG) BY MOUTH EVERY 8 HOURS AS NEEDED FOR NAUSEA OR VOMITING 20 tablet 0  . ONE TOUCH LANCETS MISC Use as directed once daily to check blood sugar. DX E11.09 100 each 3  . rosuvastatin (CRESTOR) 20 MG tablet TAKE 1/2 TABLET BY MOUTH EVERY DAY 90 tablet 1  . tiZANidine (ZANAFLEX) 2 MG tablet Take 1 tablet (2 mg total) by mouth every 6 (six) hours as needed for muscle spasms. 60 tablet 2  . traMADol (ULTRAM) 50 MG tablet TAKE 1 TABLET(50 MG) BY MOUTH EVERY 6 HOURS AS NEEDED 30 tablet 2   No current facility-administered medications on file prior to visit.        ROS:  All others reviewed and negative.  Objective        PE:  BP 110/78 (BP Location: Left Arm, Patient Position: Sitting, Cuff Size: Large)   Pulse (!) 59   Temp 98.9 F (37.2 C) (Oral)   SpO2 94%                 Constitutional: Pt appears in NAD               HENT: Head: NCAT.                Right  Ear: External ear normal.                 Left Ear: External ear normal.                Eyes: . Pupils are equal, round, and reactive to light. Conjunctivae  and EOM are normal               Nose: without d/c or deformity               Neck: Neck supple. Gross normal ROM               Cardiovascular: Normal rate and regular rhythm.                 Pulmonary/Chest: Effort normal and breath sounds without rales or wheezing.                Abd:  Soft, NT, ND, + BS, no organomegaly               Neurological: Pt is alert. At baseline orientation, motor grossly intact               Skin: Skin is warm. No rashes, no other new lesions, LE edema - 2+ left > right; LLE also with 2 cm are venous ulceration with associated area just lateral to this and above the ankle of red, tender, swelling and weepiness               Psychiatric: Pt behavior is normal without agitation   Micro: none  Cardiac tracings I have personally interpreted today:  none  Pertinent Radiological findings (summarize): none   Lab Results  Component Value Date   WBC 5.8 11/01/2020   HGB 10.8 (L) 11/01/2020   HCT 32.8 (L) 11/01/2020   PLT 146 11/01/2020   GLUCOSE 176 (H) 11/01/2020   CHOL 144 11/01/2020   TRIG 96 11/01/2020   HDL 39 (L) 11/01/2020   LDLCALC 86 11/01/2020   ALT 15 11/01/2020   AST 17 11/01/2020   NA 142 11/01/2020   K 4.5 11/01/2020   CL 109 11/01/2020   CREATININE 1.13 (H) 11/01/2020   BUN 35 (H) 11/01/2020   CO2 23 11/01/2020   TSH 1.19 11/01/2020   INR 1.11 01/15/2014   HGBA1C 11.3 (H) 11/01/2020   MICROALBUR 4.6 11/01/2020   Assessment/Plan:  Holly Weiss is a 80 y.o. Black or African American [2] female with  has a past medical history of ANEMIA-NOS (01/18/2008), Arthritis, ARTHRITIS, GENERALIZED (08/18/2010), ASTHMA, WITH ACUTE EXACERBATION (11/29/2008), Bilateral carpal tunnel syndrome, Chronic pain syndrome (01/21/2010), Dementia ([place]), Depression, DIABETIC  RETINOPATHY (51/88/4166), DM,  UNCOMPLICATED, TYPE II, UNCONTROLLED (05/30/2007), Edema of both legs, Femur fracture, right ([place]), GERD (gastroesophageal reflux disease), GLAUCOMA NOS (05/30/2007), History of blood transfusion, History of kidney stones, HYPERLIPIDEMIA (05/30/2007), HYPERTENSION (10/31/2007), LOW BACK PAIN (01/18/2008), Migraine headache, MORBID OBESITY, HX OF (05/30/2007), NEPHROLITHIASIS, HX OF (01/18/2008), Numbness and tingling in hands, Seasonal allergies, Shortness of breath, SHOULDER PAIN, BILATERAL (04/09/2009), Spinal stenosis, and Urgency of urination.  Combined systolic and diastolic congestive heart failure (HCC) Pt and family asked to restart previously rx lasix to 40 qd instead of half dosing per famiy, or she will soon need hospn; to restart HH with RN and PT  Uncontrolled diabetes mellitus with hyperglycemia, with long-term current use of insulin (Holly Weiss) Pt encourage to take 30 units lantus, and f/u with nedo  Anxiety and depression Also for start celexa 10 qd,, declines counseling referral or psychiatry  Left leg cellulitis Mild to mod, for antibx course,  to f/u any worsening symptoms or concerns  Leg wound, left Also for wound clinic referral,  to f/u any worsening symptoms or concerns  Decubitus ulcer, buttock incidnental noted today , also for wound clinic  Followup: Return in about 4 weeks (around 01/29/2021).  Cathlean Cower, MD 01/04/2021 8:49 PM [facility]

## 2021-01-01 NOTE — Patient Instructions (Addendum)
Ok to increase the lantus to 30 units daily  You had the antibiotic shot today (rocephin)  Please take all new medication as prescribed - the pill antibiotic (doxycycline), and the celexa for anxiety  Please take the lasix at 40 mg per day  You will be contacted regarding the referral for: [place], Endocrinology (for sugar), and wound clinic  Please continue all other medications as before, and refills have been done if requested.  Please have the pharmacy call with any other refills you may need.  Please continue your efforts at being more active, low cholesterol diet, and weight control.  Please keep your appointments with your specialists as you may have planned  Please make an Appointment to return in 1 month

## 2021-01-04 ENCOUNTER — Encounter: Payer: Self-pay | Admitting: Internal Medicine

## 2021-01-04 NOTE — Assessment & Plan Note (Signed)
Mild to mod, for antibx course,  to f/u any worsening symptoms or concerns 

## 2021-01-04 NOTE — Assessment & Plan Note (Signed)
incidnental noted today , also for wound clinic

## 2021-01-04 NOTE — Assessment & Plan Note (Addendum)
Pt and family asked to restart previously rx lasix to 40 qd instead of half dosing per famiy, or she will soon need hospn; to restart [facility] with RN and PT

## 2021-01-04 NOTE — Assessment & Plan Note (Signed)
Pt encourage to take 30 units lantus, and f/u with nedo

## 2021-01-04 NOTE — Assessment & Plan Note (Signed)
Also for wound clinic referral,  to f/u any worsening symptoms or concerns

## 2021-01-04 NOTE — Assessment & Plan Note (Signed)
Also for start celexa 10 qd,, declines counseling referral or psychiatry

## 2021-01-13 ENCOUNTER — Encounter: Payer: Self-pay | Admitting: Endocrinology

## 2021-01-31 DIAGNOSIS — E1122 Type 2 diabetes mellitus with diabetic chronic kidney disease: Secondary | ICD-10-CM | POA: Diagnosis not present

## 2021-01-31 DIAGNOSIS — K219 Gastro-esophageal reflux disease without esophagitis: Secondary | ICD-10-CM | POA: Diagnosis not present

## 2021-01-31 DIAGNOSIS — M4805 Spinal stenosis, thoracolumbar region: Secondary | ICD-10-CM | POA: Diagnosis not present

## 2021-01-31 DIAGNOSIS — N183 Chronic kidney disease, stage 3 unspecified: Secondary | ICD-10-CM | POA: Diagnosis not present

## 2021-01-31 DIAGNOSIS — I5042 Chronic combined systolic (congestive) and diastolic (congestive) heart failure: Secondary | ICD-10-CM | POA: Diagnosis not present

## 2021-01-31 DIAGNOSIS — M810 Age-related osteoporosis without current pathological fracture: Secondary | ICD-10-CM | POA: Diagnosis not present

## 2021-01-31 DIAGNOSIS — I2510 Atherosclerotic heart disease of native coronary artery without angina pectoris: Secondary | ICD-10-CM | POA: Diagnosis not present

## 2021-01-31 DIAGNOSIS — H409 Unspecified glaucoma: Secondary | ICD-10-CM | POA: Diagnosis not present

## 2021-01-31 DIAGNOSIS — J45909 Unspecified asthma, uncomplicated: Secondary | ICD-10-CM | POA: Diagnosis not present

## 2021-01-31 DIAGNOSIS — M4802 Spinal stenosis, cervical region: Secondary | ICD-10-CM | POA: Diagnosis not present

## 2021-01-31 DIAGNOSIS — D649 Anemia, unspecified: Secondary | ICD-10-CM | POA: Diagnosis not present

## 2021-01-31 DIAGNOSIS — G5603 Carpal tunnel syndrome, bilateral upper limbs: Secondary | ICD-10-CM | POA: Diagnosis not present

## 2021-01-31 DIAGNOSIS — E785 Hyperlipidemia, unspecified: Secondary | ICD-10-CM | POA: Diagnosis not present

## 2021-01-31 DIAGNOSIS — G43909 Migraine, unspecified, not intractable, without status migrainosus: Secondary | ICD-10-CM | POA: Diagnosis not present

## 2021-01-31 DIAGNOSIS — R350 Frequency of micturition: Secondary | ICD-10-CM | POA: Diagnosis not present

## 2021-01-31 DIAGNOSIS — F32A Depression, unspecified: Secondary | ICD-10-CM | POA: Diagnosis not present

## 2021-01-31 DIAGNOSIS — I130 Hypertensive heart and chronic kidney disease with heart failure and stage 1 through stage 4 chronic kidney disease, or unspecified chronic kidney disease: Secondary | ICD-10-CM | POA: Diagnosis not present

## 2021-01-31 DIAGNOSIS — E11319 Type 2 diabetes mellitus with unspecified diabetic retinopathy without macular edema: Secondary | ICD-10-CM | POA: Diagnosis not present

## 2021-01-31 DIAGNOSIS — M4316 Spondylolisthesis, lumbar region: Secondary | ICD-10-CM | POA: Diagnosis not present

## 2021-01-31 DIAGNOSIS — G894 Chronic pain syndrome: Secondary | ICD-10-CM | POA: Diagnosis not present

## 2021-01-31 DIAGNOSIS — M1990 Unspecified osteoarthritis, unspecified site: Secondary | ICD-10-CM | POA: Diagnosis not present

## 2021-02-03 DIAGNOSIS — M4316 Spondylolisthesis, lumbar region: Secondary | ICD-10-CM | POA: Diagnosis not present

## 2021-02-03 DIAGNOSIS — N183 Chronic kidney disease, stage 3 unspecified: Secondary | ICD-10-CM | POA: Diagnosis not present

## 2021-02-03 DIAGNOSIS — E785 Hyperlipidemia, unspecified: Secondary | ICD-10-CM | POA: Diagnosis not present

## 2021-02-03 DIAGNOSIS — J45909 Unspecified asthma, uncomplicated: Secondary | ICD-10-CM | POA: Diagnosis not present

## 2021-02-03 DIAGNOSIS — M810 Age-related osteoporosis without current pathological fracture: Secondary | ICD-10-CM | POA: Diagnosis not present

## 2021-02-03 DIAGNOSIS — I2510 Atherosclerotic heart disease of native coronary artery without angina pectoris: Secondary | ICD-10-CM | POA: Diagnosis not present

## 2021-02-03 DIAGNOSIS — E1122 Type 2 diabetes mellitus with diabetic chronic kidney disease: Secondary | ICD-10-CM | POA: Diagnosis not present

## 2021-02-03 DIAGNOSIS — R350 Frequency of micturition: Secondary | ICD-10-CM | POA: Diagnosis not present

## 2021-02-03 DIAGNOSIS — F32A Depression, unspecified: Secondary | ICD-10-CM | POA: Diagnosis not present

## 2021-02-03 DIAGNOSIS — M4805 Spinal stenosis, thoracolumbar region: Secondary | ICD-10-CM | POA: Diagnosis not present

## 2021-02-03 DIAGNOSIS — D649 Anemia, unspecified: Secondary | ICD-10-CM | POA: Diagnosis not present

## 2021-02-03 DIAGNOSIS — K219 Gastro-esophageal reflux disease without esophagitis: Secondary | ICD-10-CM | POA: Diagnosis not present

## 2021-02-03 DIAGNOSIS — I5042 Chronic combined systolic (congestive) and diastolic (congestive) heart failure: Secondary | ICD-10-CM | POA: Diagnosis not present

## 2021-02-03 DIAGNOSIS — H409 Unspecified glaucoma: Secondary | ICD-10-CM | POA: Diagnosis not present

## 2021-02-03 DIAGNOSIS — E11319 Type 2 diabetes mellitus with unspecified diabetic retinopathy without macular edema: Secondary | ICD-10-CM | POA: Diagnosis not present

## 2021-02-03 DIAGNOSIS — G5603 Carpal tunnel syndrome, bilateral upper limbs: Secondary | ICD-10-CM | POA: Diagnosis not present

## 2021-02-03 DIAGNOSIS — I130 Hypertensive heart and chronic kidney disease with heart failure and stage 1 through stage 4 chronic kidney disease, or unspecified chronic kidney disease: Secondary | ICD-10-CM | POA: Diagnosis not present

## 2021-02-03 DIAGNOSIS — G43909 Migraine, unspecified, not intractable, without status migrainosus: Secondary | ICD-10-CM | POA: Diagnosis not present

## 2021-02-03 DIAGNOSIS — M4802 Spinal stenosis, cervical region: Secondary | ICD-10-CM | POA: Diagnosis not present

## 2021-02-03 DIAGNOSIS — G894 Chronic pain syndrome: Secondary | ICD-10-CM | POA: Diagnosis not present

## 2021-02-03 DIAGNOSIS — M1990 Unspecified osteoarthritis, unspecified site: Secondary | ICD-10-CM | POA: Diagnosis not present

## 2021-02-05 DIAGNOSIS — I2510 Atherosclerotic heart disease of native coronary artery without angina pectoris: Secondary | ICD-10-CM | POA: Diagnosis not present

## 2021-02-05 DIAGNOSIS — D649 Anemia, unspecified: Secondary | ICD-10-CM | POA: Diagnosis not present

## 2021-02-05 DIAGNOSIS — F32A Depression, unspecified: Secondary | ICD-10-CM | POA: Diagnosis not present

## 2021-02-05 DIAGNOSIS — K219 Gastro-esophageal reflux disease without esophagitis: Secondary | ICD-10-CM | POA: Diagnosis not present

## 2021-02-05 DIAGNOSIS — J45909 Unspecified asthma, uncomplicated: Secondary | ICD-10-CM | POA: Diagnosis not present

## 2021-02-05 DIAGNOSIS — E11319 Type 2 diabetes mellitus with unspecified diabetic retinopathy without macular edema: Secondary | ICD-10-CM | POA: Diagnosis not present

## 2021-02-05 DIAGNOSIS — E785 Hyperlipidemia, unspecified: Secondary | ICD-10-CM | POA: Diagnosis not present

## 2021-02-05 DIAGNOSIS — M4802 Spinal stenosis, cervical region: Secondary | ICD-10-CM | POA: Diagnosis not present

## 2021-02-05 DIAGNOSIS — I5042 Chronic combined systolic (congestive) and diastolic (congestive) heart failure: Secondary | ICD-10-CM | POA: Diagnosis not present

## 2021-02-05 DIAGNOSIS — H409 Unspecified glaucoma: Secondary | ICD-10-CM | POA: Diagnosis not present

## 2021-02-05 DIAGNOSIS — M1990 Unspecified osteoarthritis, unspecified site: Secondary | ICD-10-CM | POA: Diagnosis not present

## 2021-02-05 DIAGNOSIS — R350 Frequency of micturition: Secondary | ICD-10-CM | POA: Diagnosis not present

## 2021-02-05 DIAGNOSIS — M4805 Spinal stenosis, thoracolumbar region: Secondary | ICD-10-CM | POA: Diagnosis not present

## 2021-02-05 DIAGNOSIS — G43909 Migraine, unspecified, not intractable, without status migrainosus: Secondary | ICD-10-CM | POA: Diagnosis not present

## 2021-02-05 DIAGNOSIS — N183 Chronic kidney disease, stage 3 unspecified: Secondary | ICD-10-CM | POA: Diagnosis not present

## 2021-02-05 DIAGNOSIS — E1122 Type 2 diabetes mellitus with diabetic chronic kidney disease: Secondary | ICD-10-CM | POA: Diagnosis not present

## 2021-02-05 DIAGNOSIS — I130 Hypertensive heart and chronic kidney disease with heart failure and stage 1 through stage 4 chronic kidney disease, or unspecified chronic kidney disease: Secondary | ICD-10-CM | POA: Diagnosis not present

## 2021-02-05 DIAGNOSIS — G5603 Carpal tunnel syndrome, bilateral upper limbs: Secondary | ICD-10-CM | POA: Diagnosis not present

## 2021-02-05 DIAGNOSIS — M810 Age-related osteoporosis without current pathological fracture: Secondary | ICD-10-CM | POA: Diagnosis not present

## 2021-02-05 DIAGNOSIS — M4316 Spondylolisthesis, lumbar region: Secondary | ICD-10-CM | POA: Diagnosis not present

## 2021-02-05 DIAGNOSIS — G894 Chronic pain syndrome: Secondary | ICD-10-CM | POA: Diagnosis not present

## 2021-02-06 DIAGNOSIS — F32A Depression, unspecified: Secondary | ICD-10-CM | POA: Diagnosis not present

## 2021-02-06 DIAGNOSIS — M810 Age-related osteoporosis without current pathological fracture: Secondary | ICD-10-CM | POA: Diagnosis not present

## 2021-02-06 DIAGNOSIS — J45909 Unspecified asthma, uncomplicated: Secondary | ICD-10-CM | POA: Diagnosis not present

## 2021-02-06 DIAGNOSIS — M1990 Unspecified osteoarthritis, unspecified site: Secondary | ICD-10-CM | POA: Diagnosis not present

## 2021-02-06 DIAGNOSIS — R350 Frequency of micturition: Secondary | ICD-10-CM | POA: Diagnosis not present

## 2021-02-06 DIAGNOSIS — I5042 Chronic combined systolic (congestive) and diastolic (congestive) heart failure: Secondary | ICD-10-CM | POA: Diagnosis not present

## 2021-02-06 DIAGNOSIS — I2510 Atherosclerotic heart disease of native coronary artery without angina pectoris: Secondary | ICD-10-CM | POA: Diagnosis not present

## 2021-02-06 DIAGNOSIS — G43909 Migraine, unspecified, not intractable, without status migrainosus: Secondary | ICD-10-CM | POA: Diagnosis not present

## 2021-02-06 DIAGNOSIS — G5603 Carpal tunnel syndrome, bilateral upper limbs: Secondary | ICD-10-CM | POA: Diagnosis not present

## 2021-02-06 DIAGNOSIS — K219 Gastro-esophageal reflux disease without esophagitis: Secondary | ICD-10-CM | POA: Diagnosis not present

## 2021-02-06 DIAGNOSIS — E1122 Type 2 diabetes mellitus with diabetic chronic kidney disease: Secondary | ICD-10-CM | POA: Diagnosis not present

## 2021-02-06 DIAGNOSIS — G894 Chronic pain syndrome: Secondary | ICD-10-CM | POA: Diagnosis not present

## 2021-02-06 DIAGNOSIS — E785 Hyperlipidemia, unspecified: Secondary | ICD-10-CM | POA: Diagnosis not present

## 2021-02-06 DIAGNOSIS — I130 Hypertensive heart and chronic kidney disease with heart failure and stage 1 through stage 4 chronic kidney disease, or unspecified chronic kidney disease: Secondary | ICD-10-CM | POA: Diagnosis not present

## 2021-02-06 DIAGNOSIS — D649 Anemia, unspecified: Secondary | ICD-10-CM | POA: Diagnosis not present

## 2021-02-06 DIAGNOSIS — M4805 Spinal stenosis, thoracolumbar region: Secondary | ICD-10-CM | POA: Diagnosis not present

## 2021-02-06 DIAGNOSIS — E11319 Type 2 diabetes mellitus with unspecified diabetic retinopathy without macular edema: Secondary | ICD-10-CM | POA: Diagnosis not present

## 2021-02-06 DIAGNOSIS — N183 Chronic kidney disease, stage 3 unspecified: Secondary | ICD-10-CM | POA: Diagnosis not present

## 2021-02-06 DIAGNOSIS — M4802 Spinal stenosis, cervical region: Secondary | ICD-10-CM | POA: Diagnosis not present

## 2021-02-06 DIAGNOSIS — H409 Unspecified glaucoma: Secondary | ICD-10-CM | POA: Diagnosis not present

## 2021-02-06 DIAGNOSIS — M4316 Spondylolisthesis, lumbar region: Secondary | ICD-10-CM | POA: Diagnosis not present

## 2021-02-07 ENCOUNTER — Encounter: Payer: Self-pay | Admitting: Internal Medicine

## 2021-02-07 DIAGNOSIS — Z794 Long term (current) use of insulin: Secondary | ICD-10-CM

## 2021-02-07 DIAGNOSIS — E1165 Type 2 diabetes mellitus with hyperglycemia: Secondary | ICD-10-CM

## 2021-02-08 DIAGNOSIS — R079 Chest pain, unspecified: Secondary | ICD-10-CM | POA: Diagnosis not present

## 2021-02-08 DIAGNOSIS — R0602 Shortness of breath: Secondary | ICD-10-CM | POA: Diagnosis not present

## 2021-02-08 DIAGNOSIS — R0789 Other chest pain: Secondary | ICD-10-CM | POA: Diagnosis not present

## 2021-02-08 DIAGNOSIS — R739 Hyperglycemia, unspecified: Secondary | ICD-10-CM | POA: Diagnosis not present

## 2021-02-08 DIAGNOSIS — R0902 Hypoxemia: Secondary | ICD-10-CM | POA: Diagnosis not present

## 2021-02-11 MED ORDER — BD PEN NEEDLE SHORT U/F 31G X 8 MM MISC: 3 refills | Status: DC

## 2021-02-11 MED ORDER — BD PEN NEEDLE SHORT U/F 31G X 8 MM MISC: 3 refills | Status: AC

## 2021-02-11 NOTE — Addendum Note (Signed)
Addended by: Biagio Borg on: 02/11/2021 06:56 PM   Modules accepted: Orders

## 2021-02-12 ENCOUNTER — Other Ambulatory Visit: Payer: Self-pay

## 2021-02-13 ENCOUNTER — Telehealth: Payer: Self-pay | Admitting: *Deleted

## 2021-02-13 NOTE — Chronic Care Management (AMB) (Signed)
  Chronic Care Management   Outreach Note  02/13/2021 Name: LAKERIA STARKMAN MRN: [id number] DOB: 1941/03/01  Criselda Peaches Bergman is a 80 y.o. year old female who is a primary care patient of Biagio Borg, MD. I reached out to Salli Real by phone today in response to a referral sent by Ms. [place] PCP Biagio Borg, MD     An unsuccessful telephone outreach was attempted today. The patient was referred to the case management team for assistance with care management and care coordination.   Follow Up Plan: A HIPAA compliant phone message was left for the patient providing contact information and requesting a return call.  If patient returns call to provider office, please advise to call Embedded Care Management Care Guide Macil Crady at [place] [place] [facility]  Direct Dial: [phone]

## 2021-02-17 ENCOUNTER — Telehealth: Payer: Self-pay | Admitting: Internal Medicine

## 2021-02-17 NOTE — Telephone Encounter (Signed)
No note needed 

## 2021-02-18 DIAGNOSIS — G5603 Carpal tunnel syndrome, bilateral upper limbs: Secondary | ICD-10-CM | POA: Diagnosis not present

## 2021-02-18 DIAGNOSIS — I130 Hypertensive heart and chronic kidney disease with heart failure and stage 1 through stage 4 chronic kidney disease, or unspecified chronic kidney disease: Secondary | ICD-10-CM | POA: Diagnosis not present

## 2021-02-18 DIAGNOSIS — I5042 Chronic combined systolic (congestive) and diastolic (congestive) heart failure: Secondary | ICD-10-CM | POA: Diagnosis not present

## 2021-02-18 DIAGNOSIS — F32A Depression, unspecified: Secondary | ICD-10-CM | POA: Diagnosis not present

## 2021-02-18 DIAGNOSIS — G894 Chronic pain syndrome: Secondary | ICD-10-CM | POA: Diagnosis not present

## 2021-02-18 DIAGNOSIS — E11319 Type 2 diabetes mellitus with unspecified diabetic retinopathy without macular edema: Secondary | ICD-10-CM | POA: Diagnosis not present

## 2021-02-18 DIAGNOSIS — H409 Unspecified glaucoma: Secondary | ICD-10-CM | POA: Diagnosis not present

## 2021-02-18 DIAGNOSIS — D649 Anemia, unspecified: Secondary | ICD-10-CM | POA: Diagnosis not present

## 2021-02-18 DIAGNOSIS — N183 Chronic kidney disease, stage 3 unspecified: Secondary | ICD-10-CM | POA: Diagnosis not present

## 2021-02-18 DIAGNOSIS — E1122 Type 2 diabetes mellitus with diabetic chronic kidney disease: Secondary | ICD-10-CM | POA: Diagnosis not present

## 2021-02-18 DIAGNOSIS — M810 Age-related osteoporosis without current pathological fracture: Secondary | ICD-10-CM | POA: Diagnosis not present

## 2021-02-18 DIAGNOSIS — M4316 Spondylolisthesis, lumbar region: Secondary | ICD-10-CM | POA: Diagnosis not present

## 2021-02-18 DIAGNOSIS — K219 Gastro-esophageal reflux disease without esophagitis: Secondary | ICD-10-CM | POA: Diagnosis not present

## 2021-02-18 DIAGNOSIS — E785 Hyperlipidemia, unspecified: Secondary | ICD-10-CM | POA: Diagnosis not present

## 2021-02-18 DIAGNOSIS — R350 Frequency of micturition: Secondary | ICD-10-CM | POA: Diagnosis not present

## 2021-02-18 DIAGNOSIS — M4802 Spinal stenosis, cervical region: Secondary | ICD-10-CM | POA: Diagnosis not present

## 2021-02-18 DIAGNOSIS — M4805 Spinal stenosis, thoracolumbar region: Secondary | ICD-10-CM | POA: Diagnosis not present

## 2021-02-18 DIAGNOSIS — G43909 Migraine, unspecified, not intractable, without status migrainosus: Secondary | ICD-10-CM | POA: Diagnosis not present

## 2021-02-18 DIAGNOSIS — I2510 Atherosclerotic heart disease of native coronary artery without angina pectoris: Secondary | ICD-10-CM | POA: Diagnosis not present

## 2021-02-18 DIAGNOSIS — J45909 Unspecified asthma, uncomplicated: Secondary | ICD-10-CM | POA: Diagnosis not present

## 2021-02-18 DIAGNOSIS — M1990 Unspecified osteoarthritis, unspecified site: Secondary | ICD-10-CM | POA: Diagnosis not present

## 2021-02-25 ENCOUNTER — Encounter: Payer: Self-pay | Admitting: Internal Medicine

## 2021-02-25 ENCOUNTER — Telehealth (INDEPENDENT_AMBULATORY_CARE_PROVIDER_SITE_OTHER): Payer: Medicare Other | Admitting: Internal Medicine

## 2021-02-25 DIAGNOSIS — E1165 Type 2 diabetes mellitus with hyperglycemia: Secondary | ICD-10-CM

## 2021-02-25 DIAGNOSIS — F0391 Unspecified dementia with behavioral disturbance: Secondary | ICD-10-CM

## 2021-02-25 DIAGNOSIS — N1831 Chronic kidney disease, stage 3a: Secondary | ICD-10-CM | POA: Diagnosis not present

## 2021-02-25 MED ORDER — DONEPEZIL HCL 5 MG PO TABS: 5.0000 mg | ORAL_TABLET | Freq: Every day | ORAL | 3 refills | Status: DC

## 2021-02-25 MED ORDER — HALOPERIDOL 1 MG PO TABS: 1.0000 mg | ORAL_TABLET | Freq: Every day | ORAL | 1 refills | Status: DC

## 2021-02-25 NOTE — Progress Notes (Signed)
Virtual Visit via Video Note  I connected with Holly Weiss on 02/25/21 at  3:20 PM EDT by a video enabled telemedicine application and verified that I am speaking with the correct person using two identifiers.  Location of all participants today Patient: at home Provider: at office   I discussed the limitations of evaluation and management by telemedicine and the availability of in person appointments. The patient expressed understanding and agreed to proceed.  History of Present Illness: Here with daughter, pt overall improved behavior wise on the 50 mg seroquel qhs but pt c/o too much sedation associated though the dose was reduced from 100 mg; memory has been getting worse with Short term recently, and asks for med now that she has declined in past.  Pt denies chest pain, increased sob or doe, wheezing, orthopnea, PND, increased LE swelling, palpitations, dizziness or syncope.   Pt denies polydipsia, polyuria, or new focal neuro s/s.   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/18/2010   ASTHMA, WITH ACUTE EXACERBATION 11/29/2008   Bilateral carpal tunnel syndrome    Chronic pain syndrome 01/21/2010   Dementia ([place])    Depression    DIABETIC  RETINOPATHY 46/50/3546   DM, UNCOMPLICATED, TYPE II, UNCONTROLLED 05/30/2007   Edema of both legs    takes Lasix   Femur fracture, right (HCC)    GERD (gastroesophageal reflux disease)    GLAUCOMA NOS 05/30/2007   History of blood transfusion    spine surgery   History of kidney stones    HYPERLIPIDEMIA 05/30/2007   HYPERTENSION 10/31/2007   LOW BACK PAIN 01/18/2008   Migraine headache    MORBID OBESITY, HX OF 05/30/2007   NEPHROLITHIASIS, HX OF 01/18/2008   Numbness and tingling in hands    Seasonal allergies    Shortness of breath    with walking   SHOULDER PAIN,  BILATERAL 04/09/2009   Spinal stenosis    Urgency of urination    Past Surgical History:  Procedure Laterality Date   BACK SURGERY     CATARACT EXTRACTION     CHOLECYSTECTOMY     lumbar disc surgury     ORIF TIBIA FRACTURE Right 01/16/2014   Procedure: RIGHT TIBIA REPAIR NON-UNION/MALUNION TIBIA WITH SLIDING GRAFT;  Surgeon: Renette Butters, MD;  Location: [facility];  Service: Orthopedics;  Laterality: Right;   THORACIC FUSION     TOTAL KNEE ARTHROPLASTY WITH REVISION COMPONENTS Right 07/12/2013   Procedure: TOTAL KNEE ARTHROPLASTY WITH TIBIA REVISION COMPONENTS;  Surgeon: Ninetta Lights, MD;  Location: [facility];  Service: Orthopedics;  Laterality: Right;   TUBAL LIGATION      reports that she has never smoked. She uses smokeless tobacco. She reports that she does not drink alcohol and does not use drugs. family history includes Dementia in her mother; Depression in her sister; Diabetes in her mother and sister; Heart disease in her father and mother; Lung cancer in her father. Allergies  Allergen Reactions   Adhesive [Tape] Itching   Endocet [Oxycodone-Acetaminophen] Itching   Latex Itching   Oxycodone-Acetaminophen Itching   Current Outpatient Medications on File Prior to Visit  Medication Sig Dispense Refill   albuterol (VENTOLIN HFA) 108 (90 Base) MCG/ACT inhaler INHALE 2 PUFFS INTO THE LUNGS TWICE DAILY AS NEEDED FOR WHEEZING OR SHORTNESS OF BREATH (Patient taking  differently: Inhale 2 puffs into the lungs 2 (two) times daily as needed for wheezing or shortness of breath.) 18 g 3   Alcohol Swabs (ALCOHOL WIPES) 70 % PADS Apply topically.     aspirin 81 MG tablet Take 1 tablet (81 mg total) by mouth daily. 30 tablet 0   Blood Glucose Monitoring Suppl ([place]) w/Device KIT Use as directed once daily to check blood sugar.  Diagnosis code 250.02 1 each 0   Calcium Carbonate-Vitamin D (CALTRATE 600+D PO) Take 2 tablets by mouth daily.     citalopram (CELEXA) 10 MG tablet Take  1 tablet (10 mg total) by mouth daily. 90 tablet 3   Continuous Blood Gluc Receiver (FREESTYLE LIBRE 14 DAY READER) DEVI Apply 1 Device topically daily. Use as directed daily E11.9 1 each 0   Continuous Blood Gluc Sensor (FREESTYLE LIBRE 14 DAY SENSOR) MISC Apply 1 Device topically every 14 (fourteen) days. E11.9 6 each 3   furosemide (LASIX) 40 MG tablet TAKE 1 TABLET BY MOUTH EVERY DAY 90 tablet 3   glucose blood (ONE TOUCH TEST STRIPS) test strip Use as directed once daily to check blood sugar.  Diagnosis code E11.9 100 each 11   HUMALOG KWIKPEN 100 UNIT/ML KwikPen SMARTSIG:15 Unit(s) SUB-Q Every Evening     insulin glargine (LANTUS SOLOSTAR) 100 UNIT/ML Solostar Pen Inject 30 Units into the skin daily. 15 mL 11   Insulin Pen Needle (B-D ULTRAFINE III SHORT PEN) 31G X 8 MM MISC USE TO CHECK BLOOD SUGERS TWICE DAILY 100 each 3   melatonin 3 MG TABS tablet Take by mouth.     metoprolol succinate (TOPROL-XL) 100 MG 24 hr tablet TAKE 1 TABLET BY MOUTH DAILY WITH OR IMMEDIATELY FOLLOWING A MEAL 90 tablet 3   Multiple Vitamins-Minerals (ONE-A-DAY EXTRAS ANTIOXIDANT PO) Take 1 tablet by mouth daily.     ondansetron (ZOFRAN) 4 MG tablet TAKE 1 TABLET(4 MG) BY MOUTH EVERY 8 HOURS AS NEEDED FOR NAUSEA OR VOMITING 20 tablet 0   ONE TOUCH LANCETS MISC Use as directed once daily to check blood sugar. DX E11.09 100 each 3   rosuvastatin (CRESTOR) 20 MG tablet TAKE 1/2 TABLET BY MOUTH EVERY DAY 90 tablet 1   tiZANidine (ZANAFLEX) 2 MG tablet Take 1 tablet (2 mg total) by mouth every 6 (six) hours as needed for muscle spasms. 60 tablet 2   traMADol (ULTRAM) 50 MG tablet TAKE 1 TABLET(50 MG) BY MOUTH EVERY 6 HOURS AS NEEDED 30 tablet 2   No current facility-administered medications on file prior to visit.    Observations/Objective: Alert, NAD, appropriate mood and affect, resps normal, cn 2-12 intact, moves all 4s, no visible rash or swelling Lab Results  Component Value Date   WBC 5.8 11/01/2020   HGB  10.8 (L) 11/01/2020   HCT 32.8 (L) 11/01/2020   PLT 146 11/01/2020   GLUCOSE 176 (H) 11/01/2020   CHOL 144 11/01/2020   TRIG 96 11/01/2020   HDL 39 (L) 11/01/2020   LDLCALC 86 11/01/2020   ALT 15 11/01/2020   AST 17 11/01/2020   NA 142 11/01/2020   K 4.5 11/01/2020   CL 109 11/01/2020   CREATININE 1.13 (H) 11/01/2020   BUN 35 (H) 11/01/2020   CO2 23 11/01/2020   TSH 1.19 11/01/2020   INR 1.11 01/15/2014   HGBA1C 11.3 (H) 11/01/2020   MICROALBUR 4.6 11/01/2020   Assessment and Plan: See notes  Follow Up Instructions: See notes   I  discussed the assessment and treatment plan with the patient. The patient was provided an opportunity to ask questions and all were answered. The patient agreed with the plan and demonstrated an understanding of the instructions.   The patient was advised to call back or seek an in-person evaluation if the symptoms worsen or if the condition fails to improve as anticipated.   Cathlean Cower, MD

## 2021-02-25 NOTE — Assessment & Plan Note (Signed)
Lab Results  Component Value Date   HGBA1C 11.3 (H) 11/01/2020   Severe recent uncontrolled, pt to continue current medical treatment insulin as declines other change for now, encourage both to f/u with endo as planned

## 2021-02-25 NOTE — Assessment & Plan Note (Signed)
Lab Results  Component Value Date   CREATININE 1.13 (H) 11/01/2020   Stable overall, cont to avoid nephrotoxins, f/u lab next visit

## 2021-02-25 NOTE — Assessment & Plan Note (Signed)
With overall worsening recently, ok for aricept 5 qd, also change seroquel to haldol 1 mg qhs as should be less sedating for now; consdier geriatric referral, or psychiatry or neurology referral but declines for now

## 2021-02-25 NOTE — Patient Instructions (Signed)
Please take all new medication as prescribed 

## 2021-02-26 NOTE — Chronic Care Management (AMB) (Signed)
  Chronic Care Management   Outreach Note  02/26/2021 Name: Holly Weiss MRN: [id number] DOB: Jul 13, 1941  Holly Weiss is a 80 y.o. year old female who is a primary care patient of Biagio Borg, MD. I reached out to Holly Weiss by phone today in response to a referral sent by Holly Weiss PCP Biagio Borg, MD     A second unsuccessful telephone outreach was attempted today. The patient was referred to the case management team for assistance with care management and care coordination.   Follow Up Plan: A HIPAA compliant phone message was left for the patient providing contact information and requesting a return call.  If patient returns call to provider office, please advise to call Embedded Care Management Care Guide Holly Weiss at [place], [place] [facility]  Direct Dial: [phone]

## 2021-02-27 ENCOUNTER — Telehealth: Payer: Self-pay | Admitting: Internal Medicine

## 2021-02-27 NOTE — Chronic Care Management (AMB) (Signed)
  Chronic Care Management   Note  02/27/2021 Name: Holly Weiss MRN: [id number] DOB: [birth year]  Holly Weiss is a 80 y.o. year old female who is a primary care patient of Biagio Borg, MD. I reached out to Holly Weiss by phone today in response to a referral sent by Holly Weiss's PCP, Biagio Borg, MD.   Holly Weiss was given information about Chronic Care Management services today including:  CCM service includes personalized support from designated clinical staff supervised by her physician, including individualized plan of care and coordination with other care providers 24/7 contact phone numbers for assistance for urgent and routine care needs. Service will only be billed when office clinical staff spend 20 minutes or more in a month to coordinate care. Only one practitioner may furnish and bill the service in a calendar month. The patient may stop CCM services at any time (effective at the end of the month) by phone call to the office staff.   Patient agreed to services and verbal consent obtained.   Follow up plan:   Lauretta Grill [employer] Scheduler

## 2021-03-03 ENCOUNTER — Encounter: Payer: Self-pay | Admitting: Internal Medicine

## 2021-03-03 MED ORDER — CEPHALEXIN 500 MG PO CAPS: 500.0000 mg | ORAL_CAPSULE | Freq: Three times a day (TID) | ORAL | 0 refills | Status: AC

## 2021-03-04 ENCOUNTER — Telehealth: Payer: Self-pay | Admitting: Internal Medicine

## 2021-03-04 DIAGNOSIS — D649 Anemia, unspecified: Secondary | ICD-10-CM | POA: Diagnosis not present

## 2021-03-04 DIAGNOSIS — G43909 Migraine, unspecified, not intractable, without status migrainosus: Secondary | ICD-10-CM | POA: Diagnosis not present

## 2021-03-04 DIAGNOSIS — M4805 Spinal stenosis, thoracolumbar region: Secondary | ICD-10-CM | POA: Diagnosis not present

## 2021-03-04 DIAGNOSIS — M4316 Spondylolisthesis, lumbar region: Secondary | ICD-10-CM | POA: Diagnosis not present

## 2021-03-04 DIAGNOSIS — E11319 Type 2 diabetes mellitus with unspecified diabetic retinopathy without macular edema: Secondary | ICD-10-CM | POA: Diagnosis not present

## 2021-03-04 DIAGNOSIS — M1990 Unspecified osteoarthritis, unspecified site: Secondary | ICD-10-CM | POA: Diagnosis not present

## 2021-03-04 DIAGNOSIS — G894 Chronic pain syndrome: Secondary | ICD-10-CM | POA: Diagnosis not present

## 2021-03-04 DIAGNOSIS — J45909 Unspecified asthma, uncomplicated: Secondary | ICD-10-CM | POA: Diagnosis not present

## 2021-03-04 DIAGNOSIS — R350 Frequency of micturition: Secondary | ICD-10-CM | POA: Diagnosis not present

## 2021-03-04 DIAGNOSIS — I5042 Chronic combined systolic (congestive) and diastolic (congestive) heart failure: Secondary | ICD-10-CM | POA: Diagnosis not present

## 2021-03-04 DIAGNOSIS — G5603 Carpal tunnel syndrome, bilateral upper limbs: Secondary | ICD-10-CM | POA: Diagnosis not present

## 2021-03-04 DIAGNOSIS — H409 Unspecified glaucoma: Secondary | ICD-10-CM | POA: Diagnosis not present

## 2021-03-04 DIAGNOSIS — K219 Gastro-esophageal reflux disease without esophagitis: Secondary | ICD-10-CM | POA: Diagnosis not present

## 2021-03-04 DIAGNOSIS — I2510 Atherosclerotic heart disease of native coronary artery without angina pectoris: Secondary | ICD-10-CM | POA: Diagnosis not present

## 2021-03-04 DIAGNOSIS — M4802 Spinal stenosis, cervical region: Secondary | ICD-10-CM | POA: Diagnosis not present

## 2021-03-04 DIAGNOSIS — E1122 Type 2 diabetes mellitus with diabetic chronic kidney disease: Secondary | ICD-10-CM | POA: Diagnosis not present

## 2021-03-04 DIAGNOSIS — E785 Hyperlipidemia, unspecified: Secondary | ICD-10-CM | POA: Diagnosis not present

## 2021-03-04 DIAGNOSIS — M810 Age-related osteoporosis without current pathological fracture: Secondary | ICD-10-CM | POA: Diagnosis not present

## 2021-03-04 DIAGNOSIS — N183 Chronic kidney disease, stage 3 unspecified: Secondary | ICD-10-CM | POA: Diagnosis not present

## 2021-03-04 DIAGNOSIS — F32A Depression, unspecified: Secondary | ICD-10-CM | POA: Diagnosis not present

## 2021-03-04 DIAGNOSIS — I130 Hypertensive heart and chronic kidney disease with heart failure and stage 1 through stage 4 chronic kidney disease, or unspecified chronic kidney disease: Secondary | ICD-10-CM | POA: Diagnosis not present

## 2021-03-04 NOTE — Telephone Encounter (Signed)
   [place] Agency Name: [place] Phone #: [phone] Service Requested: NURSING Frequency of Visits: Tillie Rung asking for nurse vit on 8/3 Patient has 3 new wounds Left leg 2 wounds on buttock stage 2

## 2021-03-05 DIAGNOSIS — G43909 Migraine, unspecified, not intractable, without status migrainosus: Secondary | ICD-10-CM | POA: Diagnosis not present

## 2021-03-05 DIAGNOSIS — H409 Unspecified glaucoma: Secondary | ICD-10-CM | POA: Diagnosis not present

## 2021-03-05 DIAGNOSIS — M1990 Unspecified osteoarthritis, unspecified site: Secondary | ICD-10-CM | POA: Diagnosis not present

## 2021-03-05 DIAGNOSIS — E11319 Type 2 diabetes mellitus with unspecified diabetic retinopathy without macular edema: Secondary | ICD-10-CM | POA: Diagnosis not present

## 2021-03-05 DIAGNOSIS — D649 Anemia, unspecified: Secondary | ICD-10-CM | POA: Diagnosis not present

## 2021-03-05 DIAGNOSIS — G5603 Carpal tunnel syndrome, bilateral upper limbs: Secondary | ICD-10-CM | POA: Diagnosis not present

## 2021-03-05 DIAGNOSIS — G894 Chronic pain syndrome: Secondary | ICD-10-CM | POA: Diagnosis not present

## 2021-03-05 DIAGNOSIS — I5042 Chronic combined systolic (congestive) and diastolic (congestive) heart failure: Secondary | ICD-10-CM | POA: Diagnosis not present

## 2021-03-05 DIAGNOSIS — I2510 Atherosclerotic heart disease of native coronary artery without angina pectoris: Secondary | ICD-10-CM | POA: Diagnosis not present

## 2021-03-05 DIAGNOSIS — M810 Age-related osteoporosis without current pathological fracture: Secondary | ICD-10-CM | POA: Diagnosis not present

## 2021-03-05 DIAGNOSIS — F32A Depression, unspecified: Secondary | ICD-10-CM | POA: Diagnosis not present

## 2021-03-05 DIAGNOSIS — N183 Chronic kidney disease, stage 3 unspecified: Secondary | ICD-10-CM | POA: Diagnosis not present

## 2021-03-05 DIAGNOSIS — J45909 Unspecified asthma, uncomplicated: Secondary | ICD-10-CM | POA: Diagnosis not present

## 2021-03-05 DIAGNOSIS — I130 Hypertensive heart and chronic kidney disease with heart failure and stage 1 through stage 4 chronic kidney disease, or unspecified chronic kidney disease: Secondary | ICD-10-CM | POA: Diagnosis not present

## 2021-03-05 DIAGNOSIS — R350 Frequency of micturition: Secondary | ICD-10-CM | POA: Diagnosis not present

## 2021-03-05 DIAGNOSIS — M4805 Spinal stenosis, thoracolumbar region: Secondary | ICD-10-CM | POA: Diagnosis not present

## 2021-03-05 DIAGNOSIS — E1122 Type 2 diabetes mellitus with diabetic chronic kidney disease: Secondary | ICD-10-CM | POA: Diagnosis not present

## 2021-03-05 DIAGNOSIS — M4316 Spondylolisthesis, lumbar region: Secondary | ICD-10-CM | POA: Diagnosis not present

## 2021-03-05 DIAGNOSIS — E785 Hyperlipidemia, unspecified: Secondary | ICD-10-CM | POA: Diagnosis not present

## 2021-03-05 DIAGNOSIS — K219 Gastro-esophageal reflux disease without esophagitis: Secondary | ICD-10-CM | POA: Diagnosis not present

## 2021-03-05 DIAGNOSIS — M4802 Spinal stenosis, cervical region: Secondary | ICD-10-CM | POA: Diagnosis not present

## 2021-03-05 NOTE — Telephone Encounter (Signed)
Verbal given to amanda.

## 2021-03-05 NOTE — Telephone Encounter (Signed)
   [place] Name: [place] Name: [place] Phone #: [phone] Service Requested: wound care Frequency of Visits: Estill Bamberg asking for orders for wound care Minor stage 2 on buttocks, order for foam dressing  Leg wound, purulent drainage, order for medihoney, santyl , foam dressing

## 2021-03-05 NOTE — Telephone Encounter (Signed)
Ok for verbals 

## 2021-03-06 DIAGNOSIS — L89322 Pressure ulcer of left buttock, stage 2: Secondary | ICD-10-CM | POA: Diagnosis not present

## 2021-03-06 DIAGNOSIS — S81802A Unspecified open wound, left lower leg, initial encounter: Secondary | ICD-10-CM | POA: Diagnosis not present

## 2021-03-06 NOTE — Telephone Encounter (Signed)
Estill Bamberg called back   Insurance will not cover the Medihoney, requesting verbals for Silvasorb instead.

## 2021-03-06 NOTE — Telephone Encounter (Signed)
Ok for verbals 

## 2021-03-06 NOTE — Telephone Encounter (Signed)
Verbals given to [employer]

## 2021-03-11 ENCOUNTER — Telehealth: Payer: Self-pay

## 2021-03-11 DIAGNOSIS — G5603 Carpal tunnel syndrome, bilateral upper limbs: Secondary | ICD-10-CM | POA: Diagnosis not present

## 2021-03-11 DIAGNOSIS — E1122 Type 2 diabetes mellitus with diabetic chronic kidney disease: Secondary | ICD-10-CM | POA: Diagnosis not present

## 2021-03-11 DIAGNOSIS — E11319 Type 2 diabetes mellitus with unspecified diabetic retinopathy without macular edema: Secondary | ICD-10-CM | POA: Diagnosis not present

## 2021-03-11 DIAGNOSIS — G894 Chronic pain syndrome: Secondary | ICD-10-CM | POA: Diagnosis not present

## 2021-03-11 DIAGNOSIS — G43909 Migraine, unspecified, not intractable, without status migrainosus: Secondary | ICD-10-CM | POA: Diagnosis not present

## 2021-03-11 DIAGNOSIS — I2510 Atherosclerotic heart disease of native coronary artery without angina pectoris: Secondary | ICD-10-CM | POA: Diagnosis not present

## 2021-03-11 DIAGNOSIS — N183 Chronic kidney disease, stage 3 unspecified: Secondary | ICD-10-CM | POA: Diagnosis not present

## 2021-03-11 DIAGNOSIS — F32A Depression, unspecified: Secondary | ICD-10-CM | POA: Diagnosis not present

## 2021-03-11 DIAGNOSIS — M1990 Unspecified osteoarthritis, unspecified site: Secondary | ICD-10-CM | POA: Diagnosis not present

## 2021-03-11 DIAGNOSIS — R350 Frequency of micturition: Secondary | ICD-10-CM | POA: Diagnosis not present

## 2021-03-11 DIAGNOSIS — M4802 Spinal stenosis, cervical region: Secondary | ICD-10-CM | POA: Diagnosis not present

## 2021-03-11 DIAGNOSIS — J45909 Unspecified asthma, uncomplicated: Secondary | ICD-10-CM | POA: Diagnosis not present

## 2021-03-11 DIAGNOSIS — D649 Anemia, unspecified: Secondary | ICD-10-CM | POA: Diagnosis not present

## 2021-03-11 DIAGNOSIS — H409 Unspecified glaucoma: Secondary | ICD-10-CM | POA: Diagnosis not present

## 2021-03-11 DIAGNOSIS — K219 Gastro-esophageal reflux disease without esophagitis: Secondary | ICD-10-CM | POA: Diagnosis not present

## 2021-03-11 DIAGNOSIS — M810 Age-related osteoporosis without current pathological fracture: Secondary | ICD-10-CM | POA: Diagnosis not present

## 2021-03-11 DIAGNOSIS — I130 Hypertensive heart and chronic kidney disease with heart failure and stage 1 through stage 4 chronic kidney disease, or unspecified chronic kidney disease: Secondary | ICD-10-CM | POA: Diagnosis not present

## 2021-03-11 DIAGNOSIS — E785 Hyperlipidemia, unspecified: Secondary | ICD-10-CM | POA: Diagnosis not present

## 2021-03-11 DIAGNOSIS — M4316 Spondylolisthesis, lumbar region: Secondary | ICD-10-CM | POA: Diagnosis not present

## 2021-03-11 DIAGNOSIS — I5042 Chronic combined systolic (congestive) and diastolic (congestive) heart failure: Secondary | ICD-10-CM | POA: Diagnosis not present

## 2021-03-11 DIAGNOSIS — M4805 Spinal stenosis, thoracolumbar region: Secondary | ICD-10-CM | POA: Diagnosis not present

## 2021-03-11 NOTE — Chronic Care Management (AMB) (Signed)
Chronic Care Management Pharmacy Assistant   Name: RAEDYN KLINCK  MRN: [id number] DOB: 1941/07/29  Holly Weiss is an 80 y.o. year old female who presents for his initial CCM visit with the clinical pharmacist.   Recent office visits:  02/25/21-James Quin Hoop, MD (PCP, Video Visit) Seen for Dementia. Start on Donepezil 5 mg at bedtime. Change seroquel to haldol 1 mg qhs. Return if symptoms worsen or fail to improve.  01/01/21-James Quin Hoop, MD (PCP) Seen for leg swelling. Start celexa 10 qd,, declines counseling referral or psychiatry. Restart previously rx lasix to 40 qd instead of half dosing per family. Increase the lantus to 30 units daily. Rocephin injection given. Referred for Home health, Endocrinology and wound clinic. Follow up in 1 month.  11/01/20-James Quin Hoop, MD (PCP) Seen for Wellness exam and hearing problem. Cleared right ear of wax. Given the prescription for the wheel chair today. Labs ordered. Follow up in 6 months. Recent consult visits:  None noted  Hospital visits:  None in previous 6 months  Medications: Outpatient Encounter Medications as of 03/11/2021  Medication Sig   albuterol (VENTOLIN HFA) 108 (90 Base) MCG/ACT inhaler INHALE 2 PUFFS INTO THE LUNGS TWICE DAILY AS NEEDED FOR WHEEZING OR SHORTNESS OF BREATH (Patient taking differently: Inhale 2 puffs into the lungs 2 (two) times daily as needed for wheezing or shortness of breath.)   Alcohol Swabs (ALCOHOL WIPES) 70 % PADS Apply topically.   aspirin 81 MG tablet Take 1 tablet (81 mg total) by mouth daily.   Blood Glucose Monitoring Suppl ([place]) w/Device KIT Use as directed once daily to check blood sugar.  Diagnosis code 250.02   Calcium Carbonate-Vitamin D (CALTRATE 600+D PO) Take 2 tablets by mouth daily.   cephALEXin (KEFLEX) 500 MG capsule Take 1 capsule (500 mg total) by mouth 3 (three) times daily for 10 days.   citalopram (CELEXA) 10 MG tablet Take 1 tablet (10 mg total) by mouth  daily.   Continuous Blood Gluc Receiver (FREESTYLE LIBRE 14 DAY READER) DEVI Apply 1 Device topically daily. Use as directed daily E11.9   Continuous Blood Gluc Sensor (FREESTYLE LIBRE 14 DAY SENSOR) MISC Apply 1 Device topically every 14 (fourteen) days. E11.9   donepezil (ARICEPT) 5 MG tablet Take 1 tablet (5 mg total) by mouth at bedtime.   furosemide (LASIX) 40 MG tablet TAKE 1 TABLET BY MOUTH EVERY DAY   glucose blood (ONE TOUCH TEST STRIPS) test strip Use as directed once daily to check blood sugar.  Diagnosis code E11.9   haloperidol (HALDOL) 1 MG tablet Take 1 tablet (1 mg total) by mouth at bedtime.   HUMALOG KWIKPEN 100 UNIT/ML KwikPen SMARTSIG:15 Unit(s) SUB-Q Every Evening   insulin glargine (LANTUS SOLOSTAR) 100 UNIT/ML Solostar Pen Inject 30 Units into the skin daily.   Insulin Pen Needle (B-D ULTRAFINE III SHORT PEN) 31G X 8 MM MISC USE TO CHECK BLOOD SUGERS TWICE DAILY   melatonin 3 MG TABS tablet Take by mouth.   metoprolol succinate (TOPROL-XL) 100 MG 24 hr tablet TAKE 1 TABLET BY MOUTH DAILY WITH OR IMMEDIATELY FOLLOWING A MEAL   Multiple Vitamins-Minerals (ONE-A-DAY EXTRAS ANTIOXIDANT PO) Take 1 tablet by mouth daily.   ondansetron (ZOFRAN) 4 MG tablet TAKE 1 TABLET(4 MG) BY MOUTH EVERY 8 HOURS AS NEEDED FOR NAUSEA OR VOMITING   ONE TOUCH LANCETS MISC Use as directed once daily to check blood sugar. DX E11.09   rosuvastatin (CRESTOR) 20 MG tablet  TAKE 1/2 TABLET BY MOUTH EVERY DAY   tiZANidine (ZANAFLEX) 2 MG tablet Take 1 tablet (2 mg total) by mouth every 6 (six) hours as needed for muscle spasms.   traMADol (ULTRAM) 50 MG tablet TAKE 1 TABLET(50 MG) BY MOUTH EVERY 6 HOURS AS NEEDED   No facility-administered encounter medications on file as of 03/11/2021.   Albuterol (VENTOLIN HFA) 108 (90 Base) MCG/ACT inhaler Last filled:03/30/18 50 DS Aspirin 81 MG tablet Last filled:None noted Calcium Carbonate-Vitamin D (CALTRATE 600+D PO) Last filled:None noted CephALEXin (KEFLEX)  500 MG capsule Last filled:03/04/21 10 DS Citalopram (CELEXA) 10 MG tablet Last filled:01/01/21 90 DS Donepezil (ARICEPT) 5 MG tablet Last filled:02/25/21 90 DS Furosemide (LASIX) 40 MG tablet Last filled:01/01/21 90 DS Haloperidol (HALDOL) 1 MG tablet Last filled:02/27/21 90 DS HUMALOG KWIKPEN 100 UNIT/ML KwikPen Last filled:04/10/20 90 DS Insulin glargine (LANTUS SOLOSTAR) 100 UNIT/ML Solostar Pen Last filled:02/15/21 46 DS  Melatonin 3 MG TABS tablet Last filled:None noted Metoprolol succinate (TOPROL-XL) 100 MG 24 hr tablet Last filled:03/05/21 90 DS Multiple Vitamins-Minerals (ONE-A-DAY EXTRAS ANTIOXIDANT PO) Last filled:None noted Ondansetron (ZOFRAN) 4 MG tablet Last filled:04/07/20 6 DS Rosuvastatin (CRESTOR) 20 MG tablet Last filled:03/06/21 90 DS TiZANidine (ZANAFLEX) 2 MG tablet Last filled:02/07/21 10 DS TraMADol (ULTRAM) 50 MG tablet Last filled:06/14/20 7 DS   Star Rating Drugs: Rosuvastatin (CRESTOR) 20 MG tablet Last filled:03/06/21 90 DS  Myriam Elta [place], [place]

## 2021-03-13 ENCOUNTER — Telehealth: Payer: Self-pay | Admitting: Internal Medicine

## 2021-03-13 DIAGNOSIS — N183 Chronic kidney disease, stage 3 unspecified: Secondary | ICD-10-CM | POA: Diagnosis not present

## 2021-03-13 DIAGNOSIS — G5603 Carpal tunnel syndrome, bilateral upper limbs: Secondary | ICD-10-CM | POA: Diagnosis not present

## 2021-03-13 DIAGNOSIS — E785 Hyperlipidemia, unspecified: Secondary | ICD-10-CM | POA: Diagnosis not present

## 2021-03-13 DIAGNOSIS — I130 Hypertensive heart and chronic kidney disease with heart failure and stage 1 through stage 4 chronic kidney disease, or unspecified chronic kidney disease: Secondary | ICD-10-CM | POA: Diagnosis not present

## 2021-03-13 DIAGNOSIS — I5042 Chronic combined systolic (congestive) and diastolic (congestive) heart failure: Secondary | ICD-10-CM | POA: Diagnosis not present

## 2021-03-13 DIAGNOSIS — G43909 Migraine, unspecified, not intractable, without status migrainosus: Secondary | ICD-10-CM | POA: Diagnosis not present

## 2021-03-13 DIAGNOSIS — H409 Unspecified glaucoma: Secondary | ICD-10-CM | POA: Diagnosis not present

## 2021-03-13 DIAGNOSIS — M4316 Spondylolisthesis, lumbar region: Secondary | ICD-10-CM | POA: Diagnosis not present

## 2021-03-13 DIAGNOSIS — M1990 Unspecified osteoarthritis, unspecified site: Secondary | ICD-10-CM | POA: Diagnosis not present

## 2021-03-13 DIAGNOSIS — R350 Frequency of micturition: Secondary | ICD-10-CM | POA: Diagnosis not present

## 2021-03-13 DIAGNOSIS — E1122 Type 2 diabetes mellitus with diabetic chronic kidney disease: Secondary | ICD-10-CM | POA: Diagnosis not present

## 2021-03-13 DIAGNOSIS — F32A Depression, unspecified: Secondary | ICD-10-CM | POA: Diagnosis not present

## 2021-03-13 DIAGNOSIS — E11319 Type 2 diabetes mellitus with unspecified diabetic retinopathy without macular edema: Secondary | ICD-10-CM | POA: Diagnosis not present

## 2021-03-13 DIAGNOSIS — G894 Chronic pain syndrome: Secondary | ICD-10-CM | POA: Diagnosis not present

## 2021-03-13 DIAGNOSIS — K219 Gastro-esophageal reflux disease without esophagitis: Secondary | ICD-10-CM | POA: Diagnosis not present

## 2021-03-13 DIAGNOSIS — J45909 Unspecified asthma, uncomplicated: Secondary | ICD-10-CM | POA: Diagnosis not present

## 2021-03-13 DIAGNOSIS — I2510 Atherosclerotic heart disease of native coronary artery without angina pectoris: Secondary | ICD-10-CM | POA: Diagnosis not present

## 2021-03-13 DIAGNOSIS — D649 Anemia, unspecified: Secondary | ICD-10-CM | POA: Diagnosis not present

## 2021-03-13 DIAGNOSIS — M810 Age-related osteoporosis without current pathological fracture: Secondary | ICD-10-CM | POA: Diagnosis not present

## 2021-03-13 DIAGNOSIS — M4802 Spinal stenosis, cervical region: Secondary | ICD-10-CM | POA: Diagnosis not present

## 2021-03-13 DIAGNOSIS — M4805 Spinal stenosis, thoracolumbar region: Secondary | ICD-10-CM | POA: Diagnosis not present

## 2021-03-13 NOTE — Telephone Encounter (Signed)
Needs ROV since she I presume she has just finished antibx cephalexin today, or consider UC or ED

## 2021-03-13 NOTE — Telephone Encounter (Signed)
   Deanna from [facility] calling to report patient has green drainage from wound on leg, warm, macerated  Deanna requesting antibiotic Phone [phone]

## 2021-03-13 NOTE — Telephone Encounter (Signed)
Holly Weiss has been notified and is willing to contact the patient.

## 2021-03-18 ENCOUNTER — Telehealth: Payer: Self-pay

## 2021-03-18 ENCOUNTER — Telehealth: Payer: Self-pay | Admitting: Internal Medicine

## 2021-03-18 MED ORDER — TIZANIDINE HCL 2 MG PO TABS: 2.0000 mg | ORAL_TABLET | Freq: Four times a day (QID) | ORAL | 2 refills | Status: DC | PRN

## 2021-03-18 NOTE — Telephone Encounter (Signed)
Patient's daughter called to schedule OV so that patient could receive the antibiotic. Offered an appointment with PCP for next week but she thought that might be to far out.   Please advise for next steps. Next availability is until next week.

## 2021-03-18 NOTE — Telephone Encounter (Signed)
Return the patients family call to both numbers on the account but the voicemail's was full.   An appointment has been made for Friday August 19, at 11:40 with Dr. Alain Marion.   Denna from [facility] was also notified of the patient appointment how ever she notified me that there was a missed visit yesterday due to the taking the patient out of town and not sure of the return date.

## 2021-03-20 ENCOUNTER — Emergency Department (HOSPITAL_COMMUNITY)
Admission: EM | Admit: 2021-03-20 | Discharge: 2021-03-20 | Disposition: A | Discharge status: Home / Self Care | Payer: Medicare Other | Attending: Emergency Medicine | Admitting: Emergency Medicine

## 2021-03-20 ENCOUNTER — Encounter (HOSPITAL_COMMUNITY): Payer: Self-pay | Admitting: Emergency Medicine

## 2021-03-20 ENCOUNTER — Other Ambulatory Visit: Payer: Self-pay

## 2021-03-20 ENCOUNTER — Emergency Department (HOSPITAL_COMMUNITY): Payer: Medicare Other

## 2021-03-20 DIAGNOSIS — Z79899 Other long term (current) drug therapy: Secondary | ICD-10-CM | POA: Insufficient documentation

## 2021-03-20 DIAGNOSIS — J45901 Unspecified asthma with (acute) exacerbation: Secondary | ICD-10-CM | POA: Insufficient documentation

## 2021-03-20 DIAGNOSIS — Z96651 Presence of right artificial knee joint: Secondary | ICD-10-CM | POA: Diagnosis not present

## 2021-03-20 DIAGNOSIS — E785 Hyperlipidemia, unspecified: Secondary | ICD-10-CM | POA: Diagnosis not present

## 2021-03-20 DIAGNOSIS — M25572 Pain in left ankle and joints of left foot: Secondary | ICD-10-CM | POA: Diagnosis present

## 2021-03-20 DIAGNOSIS — I5042 Chronic combined systolic (congestive) and diastolic (congestive) heart failure: Secondary | ICD-10-CM | POA: Diagnosis not present

## 2021-03-20 DIAGNOSIS — Z7982 Long term (current) use of aspirin: Secondary | ICD-10-CM | POA: Insufficient documentation

## 2021-03-20 DIAGNOSIS — IMO0002 Reserved for concepts with insufficient information to code with codable children: Secondary | ICD-10-CM

## 2021-03-20 DIAGNOSIS — Z794 Long term (current) use of insulin: Secondary | ICD-10-CM | POA: Diagnosis not present

## 2021-03-20 DIAGNOSIS — E11319 Type 2 diabetes mellitus with unspecified diabetic retinopathy without macular edema: Secondary | ICD-10-CM | POA: Diagnosis not present

## 2021-03-20 DIAGNOSIS — E1169 Type 2 diabetes mellitus with other specified complication: Secondary | ICD-10-CM | POA: Diagnosis not present

## 2021-03-20 DIAGNOSIS — L02612 Cutaneous abscess of left foot: Secondary | ICD-10-CM | POA: Diagnosis not present

## 2021-03-20 DIAGNOSIS — I2510 Atherosclerotic heart disease of native coronary artery without angina pectoris: Secondary | ICD-10-CM | POA: Insufficient documentation

## 2021-03-20 DIAGNOSIS — L03119 Cellulitis of unspecified part of limb: Secondary | ICD-10-CM

## 2021-03-20 DIAGNOSIS — E1136 Type 2 diabetes mellitus with diabetic cataract: Secondary | ICD-10-CM | POA: Diagnosis not present

## 2021-03-20 DIAGNOSIS — R600 Localized edema: Secondary | ICD-10-CM | POA: Diagnosis not present

## 2021-03-20 DIAGNOSIS — E1139 Type 2 diabetes mellitus with other diabetic ophthalmic complication: Secondary | ICD-10-CM | POA: Insufficient documentation

## 2021-03-20 DIAGNOSIS — J45909 Unspecified asthma, uncomplicated: Secondary | ICD-10-CM | POA: Insufficient documentation

## 2021-03-20 DIAGNOSIS — L03116 Cellulitis of left lower limb: Secondary | ICD-10-CM | POA: Diagnosis not present

## 2021-03-20 DIAGNOSIS — N183 Chronic kidney disease, stage 3 unspecified: Secondary | ICD-10-CM | POA: Diagnosis not present

## 2021-03-20 DIAGNOSIS — Z951 Presence of aortocoronary bypass graft: Secondary | ICD-10-CM | POA: Insufficient documentation

## 2021-03-20 DIAGNOSIS — E1165 Type 2 diabetes mellitus with hyperglycemia: Secondary | ICD-10-CM | POA: Diagnosis not present

## 2021-03-20 DIAGNOSIS — I130 Hypertensive heart and chronic kidney disease with heart failure and stage 1 through stage 4 chronic kidney disease, or unspecified chronic kidney disease: Secondary | ICD-10-CM | POA: Insufficient documentation

## 2021-03-20 DIAGNOSIS — F0390 Unspecified dementia without behavioral disturbance: Secondary | ICD-10-CM | POA: Diagnosis not present

## 2021-03-20 DIAGNOSIS — M7989 Other specified soft tissue disorders: Secondary | ICD-10-CM | POA: Diagnosis not present

## 2021-03-20 DIAGNOSIS — E1122 Type 2 diabetes mellitus with diabetic chronic kidney disease: Secondary | ICD-10-CM | POA: Diagnosis not present

## 2021-03-20 DIAGNOSIS — Z91040 Latex allergy status: Secondary | ICD-10-CM | POA: Insufficient documentation

## 2021-03-20 LAB — COMPREHENSIVE METABOLIC PANEL
ALT: 18 U/L (ref 0–44)
AST: 22 U/L (ref 15–41)
Albumin: 3 g/dL — ABNORMAL LOW (ref 3.5–5.0)
Alkaline Phosphatase: 69 U/L (ref 38–126)
Anion gap: 8 (ref 5–15)
BUN: 37 mg/dL — ABNORMAL HIGH (ref 8–23)
CO2: 30 mmol/L (ref 22–32)
Calcium: 9.3 mg/dL (ref 8.9–10.3)
Chloride: 101 mmol/L (ref 98–111)
Creatinine, Ser: 1.69 mg/dL — ABNORMAL HIGH (ref 0.44–1.00)
GFR, Estimated: 30 mL/min — ABNORMAL LOW (ref >60)
Glucose, Bld: 232 mg/dL — ABNORMAL HIGH (ref 70–99)
Potassium: 4.1 mmol/L (ref 3.5–5.1)
Sodium: 139 mmol/L (ref 135–145)
Total Bilirubin: 0.7 mg/dL (ref 0.3–1.2)
Total Protein: 7.4 g/dL (ref 6.5–8.1)

## 2021-03-20 LAB — CBC WITH DIFFERENTIAL/PLATELET
Abs Immature Granulocytes: 0.02 10*3/uL (ref 0.00–0.07)
Basophils Absolute: 0.1 10*3/uL (ref 0.0–0.1)
Basophils Relative: 1 %
Eosinophils Absolute: 1.1 10*3/uL — ABNORMAL HIGH (ref 0.0–0.5)
Eosinophils Relative: 13 %
HCT: 30.5 % — ABNORMAL LOW (ref 36.0–46.0)
Hemoglobin: 9.8 g/dL — ABNORMAL LOW (ref 12.0–15.0)
Immature Granulocytes: 0 %
Lymphocytes Relative: 26 %
Lymphs Abs: 2.2 10*3/uL (ref 0.7–4.0)
MCH: 29.9 pg (ref 26.0–34.0)
MCHC: 32.1 g/dL (ref 30.0–36.0)
MCV: 93 fL (ref 80.0–100.0)
Monocytes Absolute: 0.8 10*3/uL (ref 0.1–1.0)
Monocytes Relative: 10 %
Neutro Abs: 4.1 10*3/uL (ref 1.7–7.7)
Neutrophils Relative %: 50 %
Platelets: 198 10*3/uL (ref 150–400)
RBC: 3.28 MIL/uL — ABNORMAL LOW (ref 3.87–5.11)
RDW: 15.4 % (ref 11.5–15.5)
WBC: 8.3 10*3/uL (ref 4.0–10.5)
nRBC: 0 % (ref 0.0–0.2)

## 2021-03-20 LAB — LACTIC ACID, PLASMA: Lactic Acid, Venous: 1.8 mmol/L (ref 0.5–1.9)

## 2021-03-20 MED ORDER — VANCOMYCIN HCL IN DEXTROSE 1-5 GM/200ML-% IV SOLN
1000.0000 mg | Freq: Once | INTRAVENOUS | Status: AC
  Administered 2021-03-20: 1000 mg via INTRAVENOUS
  Filled 2021-03-20: qty 200

## 2021-03-20 MED ORDER — SODIUM CHLORIDE 0.9 % IV BOLUS
1000.0000 mL | Freq: Once | INTRAVENOUS | Status: AC
  Administered 2021-03-20: 1000 mL via INTRAVENOUS

## 2021-03-20 MED ORDER — MORPHINE SULFATE (PF) 4 MG/ML IV SOLN
4.0000 mg | Freq: Once | INTRAVENOUS | Status: AC
  Administered 2021-03-20: 4 mg via INTRAVENOUS
  Filled 2021-03-20: qty 1

## 2021-03-20 MED ORDER — DOXYCYCLINE HYCLATE 100 MG PO CAPS: 100.0000 mg | ORAL_CAPSULE | Freq: Two times a day (BID) | ORAL | 0 refills | Status: DC

## 2021-03-20 MED ORDER — ONDANSETRON 4 MG PO TBDP: ORAL_TABLET | ORAL | 0 refills | Status: AC

## 2021-03-20 NOTE — Discharge Instructions (Addendum)
You have left leg ulcer with cellulitis  Take doxycycline twice daily for a week  Take Zofran as needed for nausea  See your doctor for follow up  Return to ER if you have worse foot pain, purulent drainage from the ulcer, fever.

## 2021-03-20 NOTE — ED Provider Notes (Signed)
[facility] DEPT Provider Note   CSN: [id number] Arrival date & time: 03/20/21  1911     History Chief Complaint  Patient presents with   Wound Check    Holly Weiss is a 80 y.o. female history of dementia, diabetes presenting with wound in the left leg.  Patient has a chronic wound in the left ankle area.  Patient just finished a course of Keflex.  Patient was unable to get appointment with primary care doctor this week so daughter brought her in for evaluation.  Patient states that she is in a lot of pain in the left ankle area.  Daughter noticed greenish discharge.   The history is provided by the patient.      Past Medical History:  Diagnosis Date   ANEMIA-NOS 01/18/2008   Arthritis    ARTHRITIS, GENERALIZED 08/18/2010   ASTHMA, WITH ACUTE EXACERBATION 11/29/2008   Bilateral carpal tunnel syndrome    Chronic pain syndrome 01/21/2010   Dementia ([place])    Depression    DIABETIC  RETINOPATHY 64/40/3474   DM, UNCOMPLICATED, TYPE II, UNCONTROLLED 05/30/2007   Edema of both legs    takes Lasix   Femur fracture, right (HCC)    GERD (gastroesophageal reflux disease)    GLAUCOMA NOS 05/30/2007   History of blood transfusion    spine surgery   History of kidney stones    HYPERLIPIDEMIA 05/30/2007   HYPERTENSION 10/31/2007   LOW BACK PAIN 01/18/2008   Migraine headache    MORBID OBESITY, HX OF 05/30/2007   NEPHROLITHIASIS, HX OF 01/18/2008   Numbness and tingling in hands    Seasonal allergies    Shortness of breath    with walking   SHOULDER PAIN, BILATERAL 04/09/2009   Spinal stenosis    Urgency of urination     Patient Active Problem List   Diagnosis Date Noted   Left leg cellulitis 01/01/2021   Leg wound, left 01/01/2021   Decubitus ulcer, buttock 01/01/2021   Spondylolisthesis of lumbar region 07/10/2020   Unspecified osteoarthritis, unspecified site 07/10/2020   Spinal stenosis, cervical region 07/10/2020   Spinal stenosis,  thoracolumbar region 07/10/2020   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   Dementia ([place])    Cervical stenosis of spinal canal 09/28/2019   Frequent falls 09/27/2019   Fracture of 5th metatarsal 09/27/2019   Cervical spinal stenosis 09/27/2019   Cord compression syndrome ([place]) 09/27/2019   Right shoulder pain 11/02/2018   Right rotator cuff tear 11/02/2018   Gait disorder 11/02/2018   AKI (acute kidney injury) ([place]) 02/09/2018   Stasis dermatitis 11/06/2016   CKD (chronic kidney disease), stage III ([place]) 09/24/2016   Rash 09/24/2016   Sudden right hearing loss 02/25/2016   Vertigo 02/25/2016   Multiple open wounds of lower leg 01/28/2016   Pain of right heel 01/28/2016   Osteoporosis 06/04/2015   Disorder of bone and cartilage 25/95/6387   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[place]) 01/08/2015   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   CAD in native artery 11/16/2014   EKG, abnormal 08/21/2014   Dystonia 07/20/2014   S/P hardware removal 07/19/2014   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 07/19/2014   Lower leg edema 06/22/2014   History of total right knee replacement 06/05/2014   Tibial fracture 01/16/2014   Cellulitis of leg, right 11/17/2013   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   Other dysphagia 11/04/2013   Morbid obesity ([place])  10/22/2013   Diabetes type 2, uncontrolled ([place]) 10/21/2013   DJD (degenerative joint disease) of knee 07/12/2013   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   Anxiety and depression 02/14/2013   Visual hallucinations 02/14/2013   Pain in joint of right knee 02/14/2013   Uncontrolled diabetes mellitus with hyperglycemia, with long-term current use of insulin ([place])    Neck pain, acute 07/21/2011   Right knee pain 07/21/2011   Encounter for well adult exam with abnormal findings 01/27/2011   Edema 09/09/2010   KNEE PAIN, BILATERAL 09/01/2010   Arthropathy, multiple sites 08/18/2010   Chronic  pain syndrome 01/21/2010   DYSPEPSIA 01/21/2010   FATIGUE 01/21/2010   DYSPNEA ON EXERTION 01/21/2010   MYOCARDIAL PERFUSION SCAN, WITH STRESS TEST, ABNORMAL 05/01/2009   SHOULDER PAIN, BILATERAL 04/09/2009   CHEST PAIN 04/09/2009   NAUSEA 04/09/2009   JOINT EFFUSION, LEFT KNEE 11/29/2008   Abdominal pain, epigastric 08/20/2008   ANEMIA-NOS 01/18/2008   Chronic diastolic congestive heart failure ([place]) 01/18/2008   Allergic rhinitis 01/18/2008   LOW BACK PAIN 01/18/2008   NEPHROLITHIASIS, HX OF 01/18/2008   Essential hypertension 10/31/2007   DIABETIC  RETINOPATHY 05/30/2007   Hyperlipidemia 05/30/2007   MIGRAINE HEADACHE 05/30/2007   Unspecified glaucoma 05/30/2007   Asthma 05/30/2007   MORBID OBESITY, HX OF 05/30/2007    Past Surgical History:  Procedure Laterality Date   BACK SURGERY     CATARACT EXTRACTION     CHOLECYSTECTOMY     lumbar disc surgury     ORIF TIBIA FRACTURE Right 01/16/2014   Procedure: RIGHT TIBIA REPAIR NON-UNION/MALUNION TIBIA WITH SLIDING GRAFT;  Surgeon: Renette Butters, MD;  Location: [facility];  Service: Orthopedics;  Laterality: Right;   THORACIC FUSION     TOTAL KNEE ARTHROPLASTY WITH REVISION COMPONENTS Right 07/12/2013   Procedure: TOTAL KNEE ARTHROPLASTY WITH TIBIA REVISION COMPONENTS;  Surgeon: Ninetta Lights, MD;  Location: [facility];  Service: Orthopedics;  Laterality: Right;   TUBAL LIGATION       OB History     Gravida  5   Para  5   Term      Preterm      AB  0   Living  5      SAB      IAB      Ectopic      Multiple      Live Births              Family History  Problem Relation Age of Onset   Diabetes Mother    Heart disease Mother    Dementia Mother    Lung cancer Father    Heart disease Father    Diabetes Sister    Depression Sister     Social History   Tobacco Use   Smoking status: Never   Smokeless tobacco: Current  Substance Use Topics   Alcohol use: No   Drug use: No    Home  Medications Prior to Admission medications   Medication Sig Start Date End Date Taking? Authorizing Provider  albuterol (VENTOLIN HFA) 108 (90 Base) MCG/ACT inhaler INHALE 2 PUFFS INTO THE LUNGS TWICE DAILY AS NEEDED FOR WHEEZING OR SHORTNESS OF BREATH Patient taking differently: Inhale 2 puffs into the lungs 2 (two) times daily as needed for wheezing or shortness of breath. 04/05/18   Biagio Borg, MD  Alcohol Swabs (ALCOHOL WIPES) 70 % PADS Apply topically. 03/13/20   [provider]  aspirin 81 MG tablet Take 1 tablet (  81 mg total) by mouth daily. 01/17/14   Renette Butters, MD  Blood Glucose Monitoring Suppl ([place]) w/Device KIT Use as directed once daily to check blood sugar.  Diagnosis code 250.02 10/09/19   Biagio Borg, MD  Calcium Carbonate-Vitamin D (CALTRATE 600+D PO) Take 2 tablets by mouth daily.    [provider]  citalopram (CELEXA) 10 MG tablet Take 1 tablet (10 mg total) by mouth daily. 01/01/21 01/01/22  Biagio Borg, MD  Continuous Blood Gluc Receiver (FREESTYLE LIBRE 14 DAY READER) DEVI Apply 1 Device topically daily. Use as directed daily E11.9 01/22/20   Biagio Borg, MD  Continuous Blood Gluc Sensor (FREESTYLE LIBRE 14 DAY SENSOR) MISC Apply 1 Device topically every 14 (fourteen) days. E11.9 01/22/20   Biagio Borg, MD  donepezil (ARICEPT) 5 MG tablet Take 1 tablet (5 mg total) by mouth at bedtime. 02/25/21   Biagio Borg, MD  furosemide (LASIX) 40 MG tablet TAKE 1 TABLET BY MOUTH EVERY DAY 01/01/21   Biagio Borg, MD  glucose blood (ONE TOUCH TEST STRIPS) test strip Use as directed once daily to check blood sugar.  Diagnosis code E11.9 02/25/16   Biagio Borg, MD  haloperidol (HALDOL) 1 MG tablet Take 1 tablet (1 mg total) by mouth at bedtime. 02/25/21   Biagio Borg, MD  HUMALOG KWIKPEN 100 UNIT/ML KwikPen SMARTSIG:15 Unit(s) SUB-Q Every Evening 01/08/20   [provider]  insulin glargine (LANTUS SOLOSTAR) 100 UNIT/ML Solostar Pen Inject  30 Units into the skin daily. 01/01/21   Biagio Borg, MD  Insulin Pen Needle (B-D ULTRAFINE III SHORT PEN) 31G X 8 MM MISC USE TO CHECK BLOOD SUGERS TWICE DAILY 02/11/21   Biagio Borg, MD  melatonin 3 MG TABS tablet Take by mouth. 03/08/20   [provider]  metoprolol succinate (TOPROL-XL) 100 MG 24 hr tablet TAKE 1 TABLET BY MOUTH DAILY WITH OR IMMEDIATELY FOLLOWING A MEAL 04/24/20   Biagio Borg, MD  Multiple Vitamins-Minerals (ONE-A-DAY EXTRAS ANTIOXIDANT PO) Take 1 tablet by mouth daily.    [provider]  ondansetron (ZOFRAN) 4 MG tablet TAKE 1 TABLET(4 MG) BY MOUTH EVERY 8 HOURS AS NEEDED FOR NAUSEA OR VOMITING 04/06/20   Biagio Borg, MD  ONE [facility] LANCETS MISC Use as directed once daily to check blood sugar. DX E11.09 06/28/17   Biagio Borg, MD  rosuvastatin (CRESTOR) 20 MG tablet TAKE 1/2 TABLET BY MOUTH EVERY DAY 12/11/20   Biagio Borg, MD  tiZANidine (ZANAFLEX) 2 MG tablet Take 1 tablet (2 mg total) by mouth every 6 (six) hours as needed for muscle spasms. 03/18/21   Biagio Borg, MD  traMADol (ULTRAM) 50 MG tablet TAKE 1 TABLET(50 MG) BY MOUTH EVERY 6 HOURS AS NEEDED 04/06/20   Biagio Borg, MD    Allergies    Adhesive [tape], Endocet [oxycodone-acetaminophen], Latex, and Oxycodone-acetaminophen  Review of Systems   Review of Systems  Skin:  Positive for wound.  All other systems reviewed and are negative.  Physical Exam Updated Vital Signs BP (!) 158/63   Pulse 63   Temp 98.9 F (37.2 C) (Oral)   Resp 18   Ht _0  (1.549 m)   SpO2 100%   BMI 26.45 kg/m   Physical Exam Vitals and nursing note reviewed.  Constitutional:      Appearance: Normal appearance.     Comments: Chronically ill   HENT:  Head: Normocephalic.     Mouth/Throat:     Mouth: Mucous membranes are moist.  Eyes:     Extraocular Movements: Extraocular movements intact.     Pupils: Pupils are equal, round, and reactive to light.  Cardiovascular:     Rate and Rhythm: Normal  rate and regular rhythm.     Pulses: Normal pulses.     Heart sounds: Normal heart sounds.  Pulmonary:     Effort: Pulmonary effort is normal.     Breath sounds: Normal breath sounds.  Musculoskeletal:     Cervical back: Normal range of motion and neck supple.  Skin:    Comments: 2 cm ulcer in the medial aspect of the left ankle area.  There is some purulent drainage and surrounding cellulitis.  There is no obvious bony tenderness.  Neurovascular intact in the left lower extremity  Neurological:     Mental Status: She is alert.    ED Results / Procedures / Treatments   Labs (all labs ordered are listed, but only abnormal results are displayed) Labs Reviewed  CBC WITH DIFFERENTIAL/PLATELET - Abnormal; Notable for the following components:      Result Value   RBC 3.28 (*)    Hemoglobin 9.8 (*)    HCT 30.5 (*)    Eosinophils Absolute 1.1 (*)    All other components within normal limits  CULTURE, BLOOD (ROUTINE X 2)  CULTURE, BLOOD (ROUTINE X 2)  COMPREHENSIVE METABOLIC PANEL  LACTIC ACID, PLASMA  LACTIC ACID, PLASMA    EKG None  Radiology DG Tibia/Fibula Left Port  Result Date: 03/20/2021 CLINICAL DATA:  Abscess or cellulitis. Redness and swelling about left ankle and distal lower leg. EXAM: PORTABLE LEFT TIBIA AND FIBULA - 2 VIEW COMPARISON:  None. FINDINGS: Equivocal decreased density involving the medial malleolus versus positioning. There is no frank bony destruction or periosteal reaction. Chronic deformity of the lateral tibiofemoral compartment of the knee. No evidence of acute fracture there is generalized soft tissue edema of the lower leg. No soft tissue air or radiopaque foreign body. IMPRESSION: 1. Generalized soft tissue edema of the lower leg. No soft tissue air or radiopaque foreign body. 2. Equivocal decreased density involving the medial malleolus versus positioning. This would be better assessed on dedicated ankle radiograph, and may be due to positioning. 3.  Chronic changes of the knee. Electronically Signed   By: Keith Rake M.D.   On: 03/20/2021 20:21    Procedures Procedures   Angiocath insertion Performed by: Wandra Arthurs  Consent: Verbal consent obtained. Risks and benefits: risks, benefits and alternatives were discussed Time out: Immediately prior to procedure a "time out" was called to verify the correct patient, procedure, equipment, support staff and site/side marked as required.  Preparation: Patient was prepped and draped in the usual sterile fashion.  Vein Location: R antecube   Ultrasound Guided  Gauge: 20 long   Normal blood return and flush without difficulty Patient tolerance: Patient tolerated the procedure well with no immediate complications.    Medications Ordered in ED Medications  vancomycin (VANCOCIN) IVPB 1000 mg/200 mL premix (1,000 mg Intravenous New Bag/Given 03/20/21 2127)  morphine 4 MG/ML injection 4 mg (4 mg Intravenous Given 03/20/21 2123)  sodium chloride 0.9 % bolus 1,000 mL (1,000 mLs Intravenous New Bag/Given 03/20/21 2128)    ED Course  I have reviewed the triage vital signs and the nursing notes.  Pertinent labs & imaging results that were available during my care of the patient were  reviewed by me and considered in my medical decision making (see chart for details).    MDM Rules/Calculators/A&P                          SOPHI CALLIGAN is a 80 y.o. female here presenting wound in the left ankle area.  Patient has an ulcer with small surrounding cellulitis.  Will get labs and lactate.  We will give vancomycin empirically.  Patient just finished Keflex  10:43 PM White blood cell count is normal and lactate is normal.  X-ray showed cellulitis.  No osteomyelitis clinically.  Patient given vancomycin in the ED.  Will discharge patient home with doxycycline   Final Clinical Impression(s) / ED Diagnoses Final diagnoses:  Abscess or cellulitis of foot    Rx / DC Orders ED Discharge Orders      None        Drenda Freeze, MD 03/20/21 2244

## 2021-03-20 NOTE — ED Triage Notes (Signed)
Patient states she has chronic wounds on her foot and is requesting "a shot" of antibiotics for the wounds. Patient has no complaints about the wounds. Denies pain and fevers.

## 2021-03-21 ENCOUNTER — Ambulatory Visit: Payer: Medicare Other | Admitting: Internal Medicine

## 2021-03-21 ENCOUNTER — Telehealth: Payer: Self-pay

## 2021-03-21 NOTE — Telephone Encounter (Signed)
Sonia Side Ufot with [facility] has called stating he is going to send in a referral with the family to get Ms. Newbanks an aid to help her a few hours a day as sh is in need of some some while at home. Agency pt is being referred to is [facility].  Mr. Runell Gess can be reached at [phone].

## 2021-03-22 LAB — BLOOD CULTURE ID PANEL (REFLEXED) - BCID2

## 2021-03-24 LAB — CULTURE, BLOOD (ROUTINE X 2): Special Requests: ADEQUATE

## 2021-03-25 ENCOUNTER — Telehealth: Payer: Self-pay

## 2021-03-25 ENCOUNTER — Telehealth: Payer: Self-pay | Admitting: Internal Medicine

## 2021-03-25 DIAGNOSIS — N183 Chronic kidney disease, stage 3 unspecified: Secondary | ICD-10-CM | POA: Diagnosis not present

## 2021-03-25 DIAGNOSIS — M1990 Unspecified osteoarthritis, unspecified site: Secondary | ICD-10-CM | POA: Diagnosis not present

## 2021-03-25 DIAGNOSIS — G894 Chronic pain syndrome: Secondary | ICD-10-CM | POA: Diagnosis not present

## 2021-03-25 DIAGNOSIS — R350 Frequency of micturition: Secondary | ICD-10-CM | POA: Diagnosis not present

## 2021-03-25 DIAGNOSIS — M4802 Spinal stenosis, cervical region: Secondary | ICD-10-CM | POA: Diagnosis not present

## 2021-03-25 DIAGNOSIS — G5603 Carpal tunnel syndrome, bilateral upper limbs: Secondary | ICD-10-CM | POA: Diagnosis not present

## 2021-03-25 DIAGNOSIS — J45909 Unspecified asthma, uncomplicated: Secondary | ICD-10-CM | POA: Diagnosis not present

## 2021-03-25 DIAGNOSIS — M810 Age-related osteoporosis without current pathological fracture: Secondary | ICD-10-CM | POA: Diagnosis not present

## 2021-03-25 DIAGNOSIS — F32A Depression, unspecified: Secondary | ICD-10-CM | POA: Diagnosis not present

## 2021-03-25 DIAGNOSIS — K219 Gastro-esophageal reflux disease without esophagitis: Secondary | ICD-10-CM | POA: Diagnosis not present

## 2021-03-25 DIAGNOSIS — M4316 Spondylolisthesis, lumbar region: Secondary | ICD-10-CM | POA: Diagnosis not present

## 2021-03-25 DIAGNOSIS — I130 Hypertensive heart and chronic kidney disease with heart failure and stage 1 through stage 4 chronic kidney disease, or unspecified chronic kidney disease: Secondary | ICD-10-CM | POA: Diagnosis not present

## 2021-03-25 DIAGNOSIS — I2510 Atherosclerotic heart disease of native coronary artery without angina pectoris: Secondary | ICD-10-CM | POA: Diagnosis not present

## 2021-03-25 DIAGNOSIS — H409 Unspecified glaucoma: Secondary | ICD-10-CM | POA: Diagnosis not present

## 2021-03-25 DIAGNOSIS — I5042 Chronic combined systolic (congestive) and diastolic (congestive) heart failure: Secondary | ICD-10-CM | POA: Diagnosis not present

## 2021-03-25 DIAGNOSIS — M4805 Spinal stenosis, thoracolumbar region: Secondary | ICD-10-CM | POA: Diagnosis not present

## 2021-03-25 DIAGNOSIS — G43909 Migraine, unspecified, not intractable, without status migrainosus: Secondary | ICD-10-CM | POA: Diagnosis not present

## 2021-03-25 DIAGNOSIS — E1122 Type 2 diabetes mellitus with diabetic chronic kidney disease: Secondary | ICD-10-CM | POA: Diagnosis not present

## 2021-03-25 DIAGNOSIS — E785 Hyperlipidemia, unspecified: Secondary | ICD-10-CM | POA: Diagnosis not present

## 2021-03-25 DIAGNOSIS — E11319 Type 2 diabetes mellitus with unspecified diabetic retinopathy without macular edema: Secondary | ICD-10-CM | POA: Diagnosis not present

## 2021-03-25 DIAGNOSIS — D649 Anemia, unspecified: Secondary | ICD-10-CM | POA: Diagnosis not present

## 2021-03-25 MED ORDER — TRAMADOL HCL 50 MG PO TABS: ORAL_TABLET | ORAL | 2 refills | Status: DC

## 2021-03-25 NOTE — Telephone Encounter (Signed)
Ok done erx 

## 2021-03-25 NOTE — Progress Notes (Signed)
ED Antimicrobial Stewardship Positive Culture Follow Up   Holly Weiss is an 80 y.o. female who presented to [facility] on 03/20/2021 with a chief complaint of L ankle wound recently treated outpatient with keflex.  Chief Complaint  Patient presents with   Wound Check    Recent Results (from the past 720 hour(s))  Blood culture (routine x 2)     Status: Abnormal   Collection Time: 03/20/21  9:30 PM   Specimen: BLOOD  Result Value Ref Range Status   Specimen Description   Final    BLOOD RIGHT ANTECUBITAL Performed at [facility] [address], [place], [place] [address]    Special Requests   Final    BOTTLES DRAWN AEROBIC AND ANAEROBIC Blood Culture adequate volume Performed at [facility] [address] [place], [place] [address]    Culture  Setup Time   Final    GRAM POSITIVE COCCI AEROBIC BOTTLE ONLY CRITICAL RESULT CALLED TO, READ BACK BY AND VERIFIED WITH: M MANEY,RN'@0721'$  03/22/21 [place]    Culture (A)  Final    STAPHYLOCOCCUS EPIDERMIDIS THE SIGNIFICANCE OF ISOLATING THIS ORGANISM FROM A SINGLE VENIPUNCTURE CANNOT BE PREDICTED WITHOUT FURTHER CLINICAL AND CULTURE CORRELATION. SUSCEPTIBILITIES AVAILABLE ONLY ON REQUEST. Performed at [facility], [place] [address], [place], [place] [address]    Report Status 03/24/2021 FINAL  Final  Blood Culture ID Panel (Reflexed)     Status: Abnormal   Collection Time: 03/20/21  9:30 PM  Result Value Ref Range Status   Enterococcus faecalis NOT DETECTED NOT DETECTED Final   Enterococcus Faecium NOT DETECTED NOT DETECTED Final   Listeria monocytogenes NOT DETECTED NOT DETECTED Final   Staphylococcus species DETECTED (A) NOT DETECTED Final    Comment: CRITICAL RESULT CALLED TO, READ BACK BY AND VERIFIED WITH: M MANEY,RN'@0722'$  03/22/21 [place]    Staphylococcus aureus (BCID) NOT DETECTED NOT DETECTED Final   Staphylococcus epidermidis DETECTED (A) NOT DETECTED Final    Comment: Methicillin  (oxacillin) resistant coagulase negative staphylococcus. Possible blood culture contaminant (unless isolated from more than one blood culture draw or clinical case suggests pathogenicity). No antibiotic treatment is indicated for blood  culture contaminants. CRITICAL RESULT CALLED TO, READ BACK BY AND VERIFIED WITH: M MAEY,RN'@0723'$  03/22/21 [place]    Staphylococcus lugdunensis NOT DETECTED NOT DETECTED Final   Streptococcus species NOT DETECTED NOT DETECTED Final   Streptococcus agalactiae NOT DETECTED NOT DETECTED Final   Streptococcus pneumoniae NOT DETECTED NOT DETECTED Final   Streptococcus pyogenes NOT DETECTED NOT DETECTED Final   A.calcoaceticus-baumannii NOT DETECTED NOT DETECTED Final   Bacteroides fragilis NOT DETECTED NOT DETECTED Final   Enterobacterales NOT DETECTED NOT DETECTED Final   Enterobacter cloacae complex NOT DETECTED NOT DETECTED Final   Escherichia coli NOT DETECTED NOT DETECTED Final   Klebsiella aerogenes NOT DETECTED NOT DETECTED Final   Klebsiella oxytoca NOT DETECTED NOT DETECTED Final   Klebsiella pneumoniae NOT DETECTED NOT DETECTED Final   Proteus species NOT DETECTED NOT DETECTED Final   Salmonella species NOT DETECTED NOT DETECTED Final   Serratia marcescens NOT DETECTED NOT DETECTED Final   Haemophilus influenzae NOT DETECTED NOT DETECTED Final   Neisseria meningitidis NOT DETECTED NOT DETECTED Final   Pseudomonas aeruginosa NOT DETECTED NOT DETECTED Final   Stenotrophomonas maltophilia NOT DETECTED NOT DETECTED Final   Candida albicans NOT DETECTED NOT DETECTED Final   Candida auris NOT DETECTED NOT DETECTED Final   Candida glabrata NOT DETECTED NOT DETECTED Final  Candida krusei NOT DETECTED NOT DETECTED Final   Candida parapsilosis NOT DETECTED NOT DETECTED Final   Candida tropicalis NOT DETECTED NOT DETECTED Final   Cryptococcus neoformans/gattii NOT DETECTED NOT DETECTED Final   Methicillin resistance mecA/C DETECTED (A) NOT DETECTED Final     Comment: CRITICAL RESULT CALLED TO, READ BACK BY AND VERIFIED WITH: M AMEY,RN'@0723'$  03/22/21 [place] Performed at [facility], [address], [place],  [address]     Given vancomycin x 1 in ED and d/c with doxycycline x 7 days. Antibiotics appropriate for coverage based on culture results. Continue with currently plan, no changes needed at this time.  ED Provider: Deno Etienne, DO   Thank you for allowing pharmacy to participate in this patient's care.  Levonne Spiller, PharmD PGY1 Acute Care Resident  03/25/2021,9:08 AM

## 2021-03-25 NOTE — Telephone Encounter (Signed)
   Daughter calling to report patient in severe pain due to cellulitis. Requesting refill for traMADol (ULTRAM) 50 MG tablet  [place], [place]

## 2021-03-25 NOTE — Telephone Encounter (Signed)
Post ED Visit - Positive Culture Follow-up  Culture report reviewed by antimicrobial stewardship pharmacist: [place] Team '[]'$  Elenor Quinones, Pharm.D. '[]'$  Heide Guile, Pharm.D., BCPS AQ-ID '[]'$  Parks Neptune, Pharm.D., BCPS '[]'$  Alycia Rossetti, Pharm.D., BCPS '[]'$  [place], [place].D., BCPS, AAHIVP '[]'$  Legrand Como, Pharm.D., BCPS, AAHIVP '[]'$  Salome Arnt, PharmD, BCPS '[]'$  Johnnette Gourd, PharmD, BCPS '[]'$  Hughes Better, PharmD, BCPS '[]'$  Leeroy Cha, PharmD '[]'$  Laqueta Linden, PharmD, BCPS '[]'$  Albertina Parr, PharmD  [place] Team '[x]'$  Porfirio Oar, PharmD '[]'$  Lindell Spar, PharmD '[]'$  Royetta Asal, PharmD '[]'$  Graylin Shiver, Rph '[]'$  Rema Fendt) Glennon Mac, PharmD '[]'$  Arlyn Dunning, PharmD '[]'$  Netta Cedars, PharmD '[]'$  Dia Sitter, PharmD '[]'$  Leone Haven, PharmD '[]'$  Gretta Arab, PharmD '[]'$  Theodis Shove, PharmD '[]'$  Peggyann Juba, PharmD '[]'$  Reuel Boom, PharmD   Positive Blood culture Treated with Doxycycline Hyclate and Vancomycin, organism sensitive to the same and no further patient follow-up is required at this time.  Glennon Hamilton 03/25/2021, 10:19 AM

## 2021-03-26 ENCOUNTER — Ambulatory Visit: Payer: Medicare Other | Admitting: Internal Medicine

## 2021-03-27 DIAGNOSIS — E1122 Type 2 diabetes mellitus with diabetic chronic kidney disease: Secondary | ICD-10-CM | POA: Diagnosis not present

## 2021-03-27 DIAGNOSIS — F32A Depression, unspecified: Secondary | ICD-10-CM | POA: Diagnosis not present

## 2021-03-27 DIAGNOSIS — M1990 Unspecified osteoarthritis, unspecified site: Secondary | ICD-10-CM | POA: Diagnosis not present

## 2021-03-27 DIAGNOSIS — M810 Age-related osteoporosis without current pathological fracture: Secondary | ICD-10-CM | POA: Diagnosis not present

## 2021-03-27 DIAGNOSIS — M4805 Spinal stenosis, thoracolumbar region: Secondary | ICD-10-CM | POA: Diagnosis not present

## 2021-03-27 DIAGNOSIS — I5042 Chronic combined systolic (congestive) and diastolic (congestive) heart failure: Secondary | ICD-10-CM | POA: Diagnosis not present

## 2021-03-27 DIAGNOSIS — M4316 Spondylolisthesis, lumbar region: Secondary | ICD-10-CM | POA: Diagnosis not present

## 2021-03-27 DIAGNOSIS — M4802 Spinal stenosis, cervical region: Secondary | ICD-10-CM | POA: Diagnosis not present

## 2021-03-27 DIAGNOSIS — I130 Hypertensive heart and chronic kidney disease with heart failure and stage 1 through stage 4 chronic kidney disease, or unspecified chronic kidney disease: Secondary | ICD-10-CM | POA: Diagnosis not present

## 2021-03-27 DIAGNOSIS — N183 Chronic kidney disease, stage 3 unspecified: Secondary | ICD-10-CM | POA: Diagnosis not present

## 2021-03-27 DIAGNOSIS — G894 Chronic pain syndrome: Secondary | ICD-10-CM | POA: Diagnosis not present

## 2021-03-27 DIAGNOSIS — R350 Frequency of micturition: Secondary | ICD-10-CM | POA: Diagnosis not present

## 2021-03-27 DIAGNOSIS — D649 Anemia, unspecified: Secondary | ICD-10-CM | POA: Diagnosis not present

## 2021-03-27 DIAGNOSIS — G5603 Carpal tunnel syndrome, bilateral upper limbs: Secondary | ICD-10-CM | POA: Diagnosis not present

## 2021-03-27 DIAGNOSIS — K219 Gastro-esophageal reflux disease without esophagitis: Secondary | ICD-10-CM | POA: Diagnosis not present

## 2021-03-27 DIAGNOSIS — I2510 Atherosclerotic heart disease of native coronary artery without angina pectoris: Secondary | ICD-10-CM | POA: Diagnosis not present

## 2021-03-27 DIAGNOSIS — J45909 Unspecified asthma, uncomplicated: Secondary | ICD-10-CM | POA: Diagnosis not present

## 2021-03-27 DIAGNOSIS — G43909 Migraine, unspecified, not intractable, without status migrainosus: Secondary | ICD-10-CM | POA: Diagnosis not present

## 2021-03-27 DIAGNOSIS — E785 Hyperlipidemia, unspecified: Secondary | ICD-10-CM | POA: Diagnosis not present

## 2021-03-27 DIAGNOSIS — E11319 Type 2 diabetes mellitus with unspecified diabetic retinopathy without macular edema: Secondary | ICD-10-CM | POA: Diagnosis not present

## 2021-03-27 DIAGNOSIS — H409 Unspecified glaucoma: Secondary | ICD-10-CM | POA: Diagnosis not present

## 2021-03-28 ENCOUNTER — Telehealth: Payer: Medicare Other

## 2021-03-28 ENCOUNTER — Telehealth: Payer: Medicare Other | Admitting: Internal Medicine

## 2021-03-28 DIAGNOSIS — I10 Essential (primary) hypertension: Secondary | ICD-10-CM

## 2021-03-28 NOTE — Progress Notes (Signed)
Patient ID: Holly Weiss, female   DOB: 04/28/1941, 80 y.o.   MRN: [id number]  Virtual Visit via Video Note  Pt not seen on multiple attempt to connect video and by phone which went directly to voicemail  Cathlean Cower, MD

## 2021-03-29 ENCOUNTER — Other Ambulatory Visit: Payer: Self-pay

## 2021-03-29 ENCOUNTER — Observation Stay (HOSPITAL_COMMUNITY): Payer: Medicare Other

## 2021-03-29 ENCOUNTER — Encounter (HOSPITAL_COMMUNITY): Payer: Self-pay | Admitting: Family Medicine

## 2021-03-29 ENCOUNTER — Emergency Department (HOSPITAL_COMMUNITY): Payer: Medicare Other

## 2021-03-29 ENCOUNTER — Inpatient Hospital Stay (HOSPITAL_COMMUNITY)
Admission: EM | Admit: 2021-03-29 | Discharge: 2021-04-03 | DRG: 637 | Disposition: A | Discharge status: Skilled Nursing Facility | Payer: Medicare Other | Attending: Student | Admitting: Student

## 2021-03-29 DIAGNOSIS — Z801 Family history of malignant neoplasm of trachea, bronchus and lung: Secondary | ICD-10-CM | POA: Diagnosis not present

## 2021-03-29 DIAGNOSIS — F0390 Unspecified dementia without behavioral disturbance: Secondary | ICD-10-CM | POA: Diagnosis present

## 2021-03-29 DIAGNOSIS — I2510 Atherosclerotic heart disease of native coronary artery without angina pectoris: Secondary | ICD-10-CM | POA: Diagnosis present

## 2021-03-29 DIAGNOSIS — E669 Obesity, unspecified: Secondary | ICD-10-CM | POA: Diagnosis present

## 2021-03-29 DIAGNOSIS — M1380 Other specified arthritis, unspecified site: Secondary | ICD-10-CM | POA: Diagnosis not present

## 2021-03-29 DIAGNOSIS — Z96651 Presence of right artificial knee joint: Secondary | ICD-10-CM | POA: Diagnosis not present

## 2021-03-29 DIAGNOSIS — L8996 Pressure-induced deep tissue damage of unspecified site: Secondary | ICD-10-CM | POA: Diagnosis not present

## 2021-03-29 DIAGNOSIS — J45901 Unspecified asthma with (acute) exacerbation: Secondary | ICD-10-CM | POA: Diagnosis not present

## 2021-03-29 DIAGNOSIS — E785 Hyperlipidemia, unspecified: Secondary | ICD-10-CM | POA: Diagnosis not present

## 2021-03-29 DIAGNOSIS — M4804 Spinal stenosis, thoracic region: Secondary | ICD-10-CM | POA: Diagnosis not present

## 2021-03-29 DIAGNOSIS — E11649 Type 2 diabetes mellitus with hypoglycemia without coma: Secondary | ICD-10-CM | POA: Diagnosis not present

## 2021-03-29 DIAGNOSIS — E119 Type 2 diabetes mellitus without complications: Secondary | ICD-10-CM | POA: Diagnosis not present

## 2021-03-29 DIAGNOSIS — Z951 Presence of aortocoronary bypass graft: Secondary | ICD-10-CM

## 2021-03-29 DIAGNOSIS — Z794 Long term (current) use of insulin: Secondary | ICD-10-CM | POA: Diagnosis not present

## 2021-03-29 DIAGNOSIS — G894 Chronic pain syndrome: Secondary | ICD-10-CM | POA: Diagnosis not present

## 2021-03-29 DIAGNOSIS — R739 Hyperglycemia, unspecified: Secondary | ICD-10-CM | POA: Diagnosis not present

## 2021-03-29 DIAGNOSIS — E871 Hypo-osmolality and hyponatremia: Secondary | ICD-10-CM | POA: Diagnosis present

## 2021-03-29 DIAGNOSIS — F419 Anxiety disorder, unspecified: Secondary | ICD-10-CM | POA: Diagnosis present

## 2021-03-29 DIAGNOSIS — E11319 Type 2 diabetes mellitus with unspecified diabetic retinopathy without macular edema: Secondary | ICD-10-CM | POA: Diagnosis present

## 2021-03-29 DIAGNOSIS — Z79899 Other long term (current) drug therapy: Secondary | ICD-10-CM | POA: Diagnosis not present

## 2021-03-29 DIAGNOSIS — G9341 Metabolic encephalopathy: Secondary | ICD-10-CM | POA: Diagnosis not present

## 2021-03-29 DIAGNOSIS — I129 Hypertensive chronic kidney disease with stage 1 through stage 4 chronic kidney disease, or unspecified chronic kidney disease: Secondary | ICD-10-CM | POA: Diagnosis not present

## 2021-03-29 DIAGNOSIS — F0280 Dementia in other diseases classified elsewhere without behavioral disturbance: Secondary | ICD-10-CM | POA: Diagnosis not present

## 2021-03-29 DIAGNOSIS — D631 Anemia in chronic kidney disease: Secondary | ICD-10-CM | POA: Diagnosis not present

## 2021-03-29 DIAGNOSIS — K219 Gastro-esophageal reflux disease without esophagitis: Secondary | ICD-10-CM | POA: Diagnosis present

## 2021-03-29 DIAGNOSIS — D649 Anemia, unspecified: Secondary | ICD-10-CM | POA: Diagnosis not present

## 2021-03-29 DIAGNOSIS — N1832 Chronic kidney disease, stage 3b: Secondary | ICD-10-CM | POA: Diagnosis not present

## 2021-03-29 DIAGNOSIS — R3915 Urgency of urination: Secondary | ICD-10-CM | POA: Diagnosis not present

## 2021-03-29 DIAGNOSIS — E860 Dehydration: Secondary | ICD-10-CM

## 2021-03-29 DIAGNOSIS — N179 Acute kidney failure, unspecified: Secondary | ICD-10-CM | POA: Diagnosis not present

## 2021-03-29 DIAGNOSIS — G301 Alzheimer's disease with late onset: Secondary | ICD-10-CM | POA: Diagnosis not present

## 2021-03-29 DIAGNOSIS — F32A Depression, unspecified: Secondary | ICD-10-CM | POA: Diagnosis present

## 2021-03-29 DIAGNOSIS — Z833 Family history of diabetes mellitus: Secondary | ICD-10-CM | POA: Diagnosis not present

## 2021-03-29 DIAGNOSIS — Z6834 Body mass index (BMI) 34.0-34.9, adult: Secondary | ICD-10-CM

## 2021-03-29 DIAGNOSIS — Z20822 Contact with and (suspected) exposure to covid-19: Secondary | ICD-10-CM | POA: Diagnosis not present

## 2021-03-29 DIAGNOSIS — J90 Pleural effusion, not elsewhere classified: Secondary | ICD-10-CM | POA: Diagnosis not present

## 2021-03-29 DIAGNOSIS — G9340 Encephalopathy, unspecified: Secondary | ICD-10-CM

## 2021-03-29 DIAGNOSIS — Z743 Need for continuous supervision: Secondary | ICD-10-CM | POA: Diagnosis not present

## 2021-03-29 DIAGNOSIS — E162 Hypoglycemia, unspecified: Secondary | ICD-10-CM | POA: Diagnosis not present

## 2021-03-29 DIAGNOSIS — I10 Essential (primary) hypertension: Secondary | ICD-10-CM | POA: Diagnosis not present

## 2021-03-29 DIAGNOSIS — Z7982 Long term (current) use of aspirin: Secondary | ICD-10-CM | POA: Diagnosis not present

## 2021-03-29 DIAGNOSIS — I700 Atherosclerosis of aorta: Secondary | ICD-10-CM | POA: Diagnosis not present

## 2021-03-29 DIAGNOSIS — R7989 Other specified abnormal findings of blood chemistry: Secondary | ICD-10-CM | POA: Diagnosis not present

## 2021-03-29 DIAGNOSIS — N1831 Chronic kidney disease, stage 3a: Secondary | ICD-10-CM | POA: Diagnosis not present

## 2021-03-29 DIAGNOSIS — Z8249 Family history of ischemic heart disease and other diseases of the circulatory system: Secondary | ICD-10-CM | POA: Diagnosis not present

## 2021-03-29 DIAGNOSIS — E1122 Type 2 diabetes mellitus with diabetic chronic kidney disease: Secondary | ICD-10-CM | POA: Diagnosis not present

## 2021-03-29 DIAGNOSIS — H409 Unspecified glaucoma: Secondary | ICD-10-CM | POA: Diagnosis not present

## 2021-03-29 DIAGNOSIS — J189 Pneumonia, unspecified organism: Secondary | ICD-10-CM

## 2021-03-29 DIAGNOSIS — N183 Chronic kidney disease, stage 3 unspecified: Secondary | ICD-10-CM | POA: Diagnosis present

## 2021-03-29 LAB — CBC WITH DIFFERENTIAL/PLATELET
Abs Immature Granulocytes: 0.04 10*3/uL (ref 0.00–0.07)
Basophils Absolute: 0.1 10*3/uL (ref 0.0–0.1)
Basophils Relative: 1 %
Eosinophils Absolute: 0.7 10*3/uL — ABNORMAL HIGH (ref 0.0–0.5)
Eosinophils Relative: 7 %
HCT: 31.4 % — ABNORMAL LOW (ref 36.0–46.0)
Hemoglobin: 9.9 g/dL — ABNORMAL LOW (ref 12.0–15.0)
Immature Granulocytes: 0 %
Lymphocytes Relative: 24 %
Lymphs Abs: 2.3 10*3/uL (ref 0.7–4.0)
MCH: 29.6 pg (ref 26.0–34.0)
MCHC: 31.5 g/dL (ref 30.0–36.0)
MCV: 93.7 fL (ref 80.0–100.0)
Monocytes Absolute: 1.2 10*3/uL — ABNORMAL HIGH (ref 0.1–1.0)
Monocytes Relative: 13 %
Neutro Abs: 5.3 10*3/uL (ref 1.7–7.7)
Neutrophils Relative %: 55 %
Platelets: 248 10*3/uL (ref 150–400)
RBC: 3.35 MIL/uL — ABNORMAL LOW (ref 3.87–5.11)
RDW: 16.2 % — ABNORMAL HIGH (ref 11.5–15.5)
WBC: 9.6 10*3/uL (ref 4.0–10.5)
nRBC: 0 % (ref 0.0–0.2)

## 2021-03-29 LAB — CBG MONITORING, ED
Glucose-Capillary: 123 mg/dL — ABNORMAL HIGH (ref 70–99)
Glucose-Capillary: 119 mg/dL — ABNORMAL HIGH (ref 70–99)

## 2021-03-29 LAB — COMPREHENSIVE METABOLIC PANEL
ALT: 17 U/L (ref 0–44)
AST: 24 U/L (ref 15–41)
Albumin: 3.1 g/dL — ABNORMAL LOW (ref 3.5–5.0)
Alkaline Phosphatase: 65 U/L (ref 38–126)
Anion gap: 9 (ref 5–15)
BUN: 47 mg/dL — ABNORMAL HIGH (ref 8–23)
CO2: 27 mmol/L (ref 22–32)
Calcium: 9.4 mg/dL (ref 8.9–10.3)
Chloride: 107 mmol/L (ref 98–111)
Creatinine, Ser: 1.98 mg/dL — ABNORMAL HIGH (ref 0.44–1.00)
GFR, Estimated: 25 mL/min — ABNORMAL LOW (ref >60)
Glucose, Bld: 29 mg/dL — CL (ref 70–99)
Potassium: 3.5 mmol/L (ref 3.5–5.1)
Sodium: 143 mmol/L (ref 135–145)
Total Bilirubin: 0.9 mg/dL (ref 0.3–1.2)
Total Protein: 7.5 g/dL (ref 6.5–8.1)

## 2021-03-29 LAB — I-STAT CHEM 8, ED
BUN: 40 mg/dL — ABNORMAL HIGH (ref 8–23)
Calcium, Ion: 1.25 mmol/L (ref 1.15–1.40)
Chloride: 107 mmol/L (ref 98–111)
Creatinine, Ser: 2 mg/dL — ABNORMAL HIGH (ref 0.44–1.00)
Glucose, Bld: 117 mg/dL — ABNORMAL HIGH (ref 70–99)
HCT: 26 % — ABNORMAL LOW (ref 36.0–46.0)
Hemoglobin: 8.8 g/dL — ABNORMAL LOW (ref 12.0–15.0)
Potassium: 3.6 mmol/L (ref 3.5–5.1)
Sodium: 145 mmol/L (ref 135–145)
TCO2: 30 mmol/L (ref 22–32)

## 2021-03-29 LAB — RESP PANEL BY RT-PCR (FLU A&B, COVID) ARPGX2
Influenza A by PCR: NEGATIVE
Influenza B by PCR: NEGATIVE
SARS Coronavirus 2 by RT PCR: NEGATIVE

## 2021-03-29 LAB — PROCALCITONIN: Procalcitonin: <0.1 ng/mL

## 2021-03-29 LAB — LIPASE, BLOOD: Lipase: 21 U/L (ref 11–51)

## 2021-03-29 LAB — TROPONIN I (HIGH SENSITIVITY)
Troponin I (High Sensitivity): 13 ng/L (ref <18)
Troponin I (High Sensitivity): 11 ng/L (ref <18)

## 2021-03-29 MED ORDER — SODIUM CHLORIDE 0.9 % IV SOLN
1.0000 g | Freq: Once | INTRAVENOUS | Status: AC
  Administered 2021-03-29: 1 g via INTRAVENOUS
  Filled 2021-03-29: qty 10

## 2021-03-29 MED ORDER — DEXTROSE 10 % IV SOLN: INTRAVENOUS | Status: DC

## 2021-03-29 MED ORDER — SODIUM CHLORIDE 0.9 % IV SOLN
500.0000 mg | Freq: Once | INTRAVENOUS | Status: AC
  Administered 2021-03-29: 500 mg via INTRAVENOUS
  Filled 2021-03-29: qty 500

## 2021-03-29 NOTE — H&P (Addendum)
History and Physical    Holly Weiss [id number] DOB: March 18, 1941 DOA: 03/29/2021  PCP: Biagio Borg, MD   Patient coming from: Home  Chief Complaint: Altered Mental Status  HPI: Holly Weiss is a 80 y.o. 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  Normally patient is very talkative with family.  Therefore this morning she did take her morning dose of Lantus but did not eat very much.  Family took her up to [facility] to visit her grandson that is in college there and while in the car she would fall asleep multiple times which is not like her.  When they got to her destination she would not get out of the car and they had difficulty arousing her.  She refused to eat or drink anything and they decided to come back home.  After bringing her back into the house daughter went to the store to do some shopping when she got home Ms. Cousar was not any better so they brought her to the emergency room for evaluation.  She lives at home and family members rotate staying with her.  She is in a wheelchair all the time and is not able to ambulate on her own and needs assistance getting in and out of the wheelchair from family members.  Home health physical therapy does come see her according to daughter.  Family and the patient of all his refused to go to a rehab center as patient strongly desires to stay at home and the family wants to honor her wishes.   ED Course: In the emergency room she was lethargic with decreased responsiveness initially.  Her glucose was found to be less than 30 and she was given an amp of D50.  Her mental status improved.  She has no complaints at this time she states that she feels better.  After the initial D50 her glucose again dropped and a D10 infusion was started.  Labs were obtained and showed a  mild increase in her creatinine and BUN.  Hospitalist service was asked to patient and provide further management with her taking long acting insulin and not having much to eat and having repeated bouts of hypoglycemia.  At this point patient is alert and states she feels fine and has no complaints of any pain.  Labs showed sodium 143 potassium 3.5 chloride 107 bicarb 27 glucose 29 creatinine 1.98 from baseline of 1.69 BUN 47 from baseline of 37 alkaline phosphatase 65 AST 24 ALT 17 lipase 21.  CBC is unremarkable with a chronic anemia.  Initial troponin 13, repeat 11.  Covid 19 swab negative.  Influenza a and B swab negative.  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   ASTHMA, WITH ACUTE EXACERBATION 11/29/2008   Bilateral carpal tunnel syndrome    Chronic pain syndrome 01/21/2010   Dementia ([place])    Depression    DIABETIC  RETINOPATHY 61/47/0929   DM, UNCOMPLICATED, TYPE II, UNCONTROLLED 05/30/2007   Edema of both legs    takes Lasix   Femur fracture, right (HCC)    GERD (gastroesophageal reflux disease)    GLAUCOMA NOS 05/30/2007  History of blood transfusion    spine surgery   History of kidney stones    HYPERLIPIDEMIA 05/30/2007   HYPERTENSION 10/31/2007   LOW BACK PAIN 01/18/2008   Migraine headache    MORBID OBESITY, HX OF 05/30/2007   NEPHROLITHIASIS, HX OF 01/18/2008   Numbness and tingling in hands    Seasonal allergies    Shortness of breath    with walking   SHOULDER PAIN, BILATERAL 04/09/2009   Spinal stenosis    Urgency of urination     Past Surgical History:  Procedure Laterality Date   BACK SURGERY     CATARACT EXTRACTION     CHOLECYSTECTOMY     lumbar disc surgury     ORIF TIBIA FRACTURE Right 01/16/2014   Procedure: RIGHT TIBIA REPAIR NON-UNION/MALUNION TIBIA WITH SLIDING  GRAFT;  Surgeon: Renette Butters, MD;  Location: [facility];  Service: Orthopedics;  Laterality: Right;   THORACIC FUSION     TOTAL KNEE ARTHROPLASTY WITH REVISION COMPONENTS Right 07/12/2013   Procedure: TOTAL KNEE ARTHROPLASTY WITH TIBIA REVISION COMPONENTS;  Surgeon: Ninetta Lights, MD;  Location: [facility];  Service: Orthopedics;  Laterality: Right;   TUBAL LIGATION      Social History  reports that she has never smoked. She uses smokeless tobacco. She reports that she does not drink alcohol and does not use drugs.  Allergies  Allergen Reactions   Adhesive [Tape] Itching   Endocet [Oxycodone-Acetaminophen] Itching   Latex Itching   Oxycodone-Acetaminophen Itching    Family History  Problem Relation Age of Onset   Diabetes Mother    Heart disease Mother    Dementia Mother    Lung cancer Father    Heart disease Father    Diabetes Sister    Depression Sister      Prior to Admission medications   Medication Sig Start Date End Date Taking? Authorizing Provider  albuterol (VENTOLIN HFA) 108 (90 Base) MCG/ACT inhaler INHALE 2 PUFFS INTO THE LUNGS TWICE DAILY AS NEEDED FOR WHEEZING OR SHORTNESS OF BREATH Patient taking differently: Inhale 2 puffs into the lungs every 6 (six) hours as needed for wheezing or shortness of breath. 04/05/18  Yes Biagio Borg, MD  aspirin 81 MG tablet Take 1 tablet (81 mg total) by mouth daily. 01/17/14  Yes Renette Butters, MD  cholecalciferol (VITAMIN D3) 25 MCG (1000 UNIT) tablet Take 1,000 Units by mouth 2 (two) times daily.   Yes [provider]  citalopram (CELEXA) 10 MG tablet Take 1 tablet (10 mg total) by mouth daily. 01/01/21 01/01/22 Yes Biagio Borg, MD  donepezil (ARICEPT) 5 MG tablet Take 1 tablet (5 mg total) by mouth at bedtime. Patient taking differently: Take 2.5 mg by mouth at bedtime. 02/25/21  Yes Biagio Borg, MD  doxycycline (VIBRAMYCIN) 100 MG capsule Take 1 capsule (100 mg total) by mouth 2 (two) times daily. One po bid x 7  days Patient taking differently: Take 100 mg by mouth 2 (two) times daily. Start date : 03/21/21 03/20/21  Yes Drenda Freeze, MD  furosemide (LASIX) 40 MG tablet TAKE 1 TABLET BY MOUTH EVERY DAY Patient taking differently: Take 40 mg by mouth daily. 01/01/21  Yes Biagio Borg, MD  haloperidol (HALDOL) 1 MG tablet Take 1 tablet (1 mg total) by mouth at bedtime. Patient taking differently: Take 1 mg by mouth daily. 02/25/21  Yes Biagio Borg, MD  insulin glargine (LANTUS SOLOSTAR) 100 UNIT/ML Solostar Pen Inject 30 Units into  the skin daily. 01/01/21  Yes Biagio Borg, MD  metoprolol succinate (TOPROL-XL) 100 MG 24 hr tablet TAKE 1 TABLET BY MOUTH DAILY WITH OR IMMEDIATELY FOLLOWING A MEAL Patient taking differently: Take 100 mg by mouth daily. 04/24/20  Yes Biagio Borg, MD  Multiple Vitamins-Minerals (ONE-A-DAY EXTRAS ANTIOXIDANT PO) Take 1 tablet by mouth daily.   Yes [provider]  naproxen sodium (ALEVE) 220 MG tablet Take 440 mg by mouth daily as needed (pain).   Yes [provider]  ondansetron (ZOFRAN ODT) 4 MG disintegrating tablet 62m ODT q4 hours prn nausea/vomit Patient taking differently: Take 4 mg by mouth every 8 (eight) hours as needed for nausea or vomiting. 03/20/21  Yes YDrenda Freeze MD  QUEtiapine (SEROQUEL) 100 MG tablet Take 100 mg by mouth at bedtime. 03/28/21  Yes [provider]  rosuvastatin (CRESTOR) 20 MG tablet TAKE 1/2 TABLET BY MOUTH EVERY DAY Patient taking differently: Take 10 mg by mouth daily. 12/11/20  Yes JBiagio Borg MD  tiZANidine (ZANAFLEX) 2 MG tablet Take 1 tablet (2 mg total) by mouth every 6 (six) hours as needed for muscle spasms. 03/18/21  Yes JBiagio Borg MD  traMADol (ULTRAM) 50 MG tablet TAKE 1 TABLET(50 MG) BY MOUTH EVERY 6 HOURS AS NEEDED Patient taking differently: Take 50 mg by mouth every 6 (six) hours as needed for moderate pain. 03/25/21  Yes JBiagio Borg MD  Alcohol Swabs (ALCOHOL WIPES) 70 % PADS Apply  topically. 03/13/20   [provider]  Blood Glucose Monitoring Suppl (OHideout w/Device KIT Use as directed once daily to check blood sugar.  Diagnosis code 250.02 10/09/19   JBiagio Borg MD  Continuous Blood Gluc Receiver (FREESTYLE LIBRE 14 DAY READER) DEVI Apply 1 Device topically daily. Use as directed daily E11.9 01/22/20   JBiagio Borg MD  Continuous Blood Gluc Sensor (FREESTYLE LIBRE 14 DAY SENSOR) MISC Apply 1 Device topically every 14 (fourteen) days. E11.9 01/22/20   JBiagio Borg MD  glucose blood (ONE TOUCH TEST STRIPS) test strip Use as directed once daily to check blood sugar.  Diagnosis code E11.9 02/25/16   JBiagio Borg MD  Insulin Pen Needle (B-D ULTRAFINE III SHORT PEN) 31G X 8 MM MISC USE TO CHECK BLOOD SUGERS TWICE DAILY 02/11/21   JBiagio Borg MD  melatonin 3 MG TABS tablet Take by mouth. Patient not taking: No sig reported 03/08/20   [provider]  ONE TOUCH LANCETS MISC Use as directed once daily to check blood sugar. DX E11.09 06/28/17   JBiagio Borg MD    Physical Exam: Vitals:   03/29/21 2115 03/29/21 2130 03/29/21 2145 03/29/21 2306  BP: (!) 145/64 (!) 129/47 125/63 (!) 159/73  Pulse: (!) 56 (!) 55 60 61  Resp: 12 (!) 8 (!) 22 16  Temp:    97.6 F (36.4 C)  TempSrc:    Oral  SpO2: 99% 97% 97% 97%    Constitutional: NAD, calm, comfortable Vitals:   03/29/21 2115 03/29/21 2130 03/29/21 2145 03/29/21 2306  BP: (!) 145/64 (!) 129/47 125/63 (!) 159/73  Pulse: (!) 56 (!) 55 60 61  Resp: 12 (!) 8 (!) 22 16  Temp:    97.6 F (36.4 C)  TempSrc:    Oral  SpO2: 99% 97% 97% 97%   General: WDWN, Alert and oriented to self  Eyes: EOMI, PERRL,conjunctivae normal.  Sclera nonicteric HENT:  [place]/AT, external ears normal.  Nares patent without epistasis.  Mucous membranes are dry Neck: Soft, normal range of motion, supple, no masses, Trachea midline Respiratory: clear to auscultation bilaterally, no wheezing, no crackles. Normal  respiratory effort. No accessory muscle use.  Cardiovascular: Regular rate and rhythm, no murmurs / rubs / gallops. Has mild lower extremity edema. Abdomen: Soft, no tenderness, nondistended, no rebound or guarding.  No masses palpated. Bowel sounds normoactive Musculoskeletal:  Has chronic deformity/thickness of joints of hands/fingers. no cyanosis. Skin: Warm, dry, intact no rashes, lesions, ulcers. No induration Neurologic: CN 2-12 grossly intact.  Normal speech. Grip strength 4/5 bilaterally Psychiatric: Normal mood.    Labs on Admission: I have personally reviewed following labs and imaging studies  CBC: Recent Labs  Lab 03/29/21 1807 03/29/21 1844  WBC 9.6  --   NEUTROABS 5.3  --   HGB 9.9* 8.8*  HCT 31.4* 26.0*  MCV 93.7  --   PLT 248  --     Basic Metabolic Panel: Recent Labs  Lab 03/29/21 1807 03/29/21 1844  NA 143 145  K 3.5 3.6  CL 107 107  CO2 27  --   GLUCOSE 29* 117*  BUN 47* 40*  CREATININE 1.98* 2.00*  CALCIUM 9.4  --     GFR: CrCl cannot be calculated (Unknown ideal weight.).  Liver Function Tests: Recent Labs  Lab 03/29/21 1807  AST 24  ALT 17  ALKPHOS 65  BILITOT 0.9  PROT 7.5  ALBUMIN 3.1*    Urine analysis:    Component Value Date/Time   COLORURINE YELLOW 11/01/2020 1622   APPEARANCEUR CLEAR 11/01/2020 1622   LABSPEC 1.022 11/01/2020 1622   PHURINE < OR = 5.0 11/01/2020 1622   GLUCOSEU TRACE (A) 11/01/2020 1622   GLUCOSEU NEGATIVE 01/22/2020 1620   HGBUR NEGATIVE 11/01/2020 1622   BILIRUBINUR NEGATIVE 07/24/2020 2146   KETONESUR NEGATIVE 11/01/2020 1622   PROTEINUR TRACE (A) 11/01/2020 1622   UROBILINOGEN 0.2 01/22/2020 1620   NITRITE NEGATIVE 11/01/2020 1622   LEUKOCYTESUR 2+ (A) 11/01/2020 1622    Radiological Exams on Admission: CT Head Wo Contrast  Result Date: 03/29/2021 CLINICAL DATA:  Altered mental status. EXAM: CT HEAD WITHOUT CONTRAST TECHNIQUE: Contiguous axial images were obtained from the base of the skull  through the vertex without intravenous contrast. COMPARISON:  September 27, 2019 FINDINGS: Brain: There is mild cerebral atrophy with widening of the extra-axial spaces and ventricular dilatation. There are areas of decreased attenuation within the white matter tracts of the supratentorial brain, consistent with microvascular disease changes. Vascular: No hyperdense vessel or unexpected calcification. Skull: Normal. Negative for fracture or focal lesion. Sinuses/Orbits: Chronic diffuse calvarial thickening is seen with stable appearing mixed lytic and sclerotic areas. Other: None. IMPRESSION: 1. Generalized cerebral atrophy. 2. No acute intracranial abnormality. Electronically Signed   By: Virgina Norfolk M.D.   On: 03/29/2021 19:06   CT CHEST WO CONTRAST  Result Date: 03/29/2021 CLINICAL DATA:  Concern for infiltrate. EXAM: CT CHEST WITHOUT CONTRAST TECHNIQUE: Multidetector CT imaging of the chest was performed following the standard protocol without IV contrast. COMPARISON:  Chest radiograph dated 03/29/2021. FINDINGS: Evaluation of this exam is limited in the absence of intravenous contrast. Cardiovascular: Mild cardiomegaly. No pericardial effusion. There is a three-vessel coronary vascular calcification. Mild atherosclerotic calcification of the thoracic aorta. No aneurysmal dilatation. The central pulmonary arteries are grossly unremarkable. Mediastinum/Nodes: No hilar or mediastinal adenopathy. Evaluation however is limited in the absence of intravenous contrast. The esophagus and the thyroid gland are grossly unremarkable.  No mediastinal fluid collection. Lungs/Pleura: Bilateral lower lobe linear atelectasis/scarring. Focal consolidation, pleural effusion, or pneumothorax. The central airways are patent. Upper Abdomen: Cholecystectomy. Musculoskeletal: Median sternotomy wires. Degenerative changes of the spine. No acute osseous pathology. IMPRESSION: 1. No acute intrathoracic pathology. 2. Mild  cardiomegaly with three-vessel coronary vascular calcification. 3. Aortic Atherosclerosis (ICD10-I70.0). Electronically Signed   By: Anner Crete M.D.   On: 03/29/2021 22:39   DG Chest Portable 1 View  Result Date: 03/29/2021 CLINICAL DATA:  Hypoglycemia. EXAM: PORTABLE CHEST 1 VIEW COMPARISON:  September 27, 2019 FINDINGS: Multiple sternal wires are seen. Low lung volumes are noted. Mild areas of atelectasis and/or infiltrate are seen along the infrahilar regions, bilaterally. There is no evidence of a pleural effusion or pneumothorax. The heart size and mediastinal contours are within normal limits. Multilevel degenerative changes seen throughout the thoracic spine. IMPRESSION: 1. Evidence of prior median sternotomy. 2. Mild bilateral infrahilar atelectasis and/or infiltrate. Electronically Signed   By: Virgina Norfolk M.D.   On: 03/29/2021 18:44    EKG: Independently reviewed.  EKG shows normal sinus rhythm with no acute ST elevation or depression.  QTc 473  Assessment/Plan Principal Problem:   Hypoglycemia Ms. Kruschke is placed on Med-Surg floor for observation. She presented with decreased mental status and was found to have blood sugar of 23. Given D50 but continued to run low in ER and was placed on D10 infusion. Blood sugar now over 100.  Pt uses lantus and had her am dose.  Will be observed to make sure does not have rebound hypoglycemia from long acting insulin of lantus.  Ordered D5NS at 50 ml/hr overnight.  Monitor blood sugar Q2 hours overnight.   Active Problems:   Essential hypertension Monitor BP. Continue metoprolol.    CKD (chronic kidney disease), stage III B Stable. Monitor renal function and electrolytes with labs in am.     Encephalopathy Pt has baseline dementia but had decreased mental status and was lethargic. Secondary to hypoglycemia and is back to baseline mental state per daughter at bedside.    Type 2 diabetes mellitus with hypoglycemia  Hold lantus.  Monitor Blood sugar Q 2 hours while on D5 infusion.     Dehydration Has elevated BUN level. Given IVF in ER. Monitor    Dementia  On aricept. Chronic  Pt had atelectasis vs infiltrate on CXR and was given roecphin and azithromycin in the ER. No signs of infection or PNA. No cough or SOB. No fever. WBC normal. Obtain CT chest to better visualize and identify if infiltrate present and needs further antibiotic    DVT prophylaxis: Heparin for DVT prophylaxis. Padua score elevated  Code Status:   Full Code  Family Communication:  Diagnosis and plan discussed with patient and her daughter who is at bedside.  Daughter verbalized understanding agrees with plan.  Further recommendations to follow as clinical indicated Disposition Plan:   Patient is from:  Home  Anticipated DC to:  Home.  Daughter reports that patient and her family do not want her to go to a rehab center and family members take turns staying with her  Anticipated DC date:  Anticipate less than 2 midnight stay  Admission status:  Observation   Yevonne Aline Jonte Wollam MD [facility]  How to contact the [facility] Attending or Consulting provider [place] or covering provider during after hours [place], for this patient?   Check the care team in [facility] and look for a) attending/consulting [place] provider listed and  b) the [facility] team listed Log into [email or web address] and use [place]'s universal password to access. If you do not have the password, please contact the hospital operator. Locate the [facility] provider you are looking for under [facility] and page to a number that you can be directly reached. If you still have difficulty reaching the provider, please page the [facility] (Director on Call) for the Hospitalists listed on amion for assistance.  03/29/2021, 11:19 PM    Addendum: CT chest shows no pneumonia, infiltrate or consolidation. No antibiotics ordered

## 2021-03-29 NOTE — ED Provider Notes (Signed)
Physical Exam  BP 125/63   Pulse 60   Temp 98.4 F (36.9 C)   Resp (!) 22   SpO2 97%   Physical Exam Vitals and nursing note reviewed.  Constitutional:      General: She is not in acute distress.    Appearance: Normal appearance. She is obese.  HENT:     Head: Normocephalic and atraumatic.     Right Ear: External ear normal.     Left Ear: External ear normal.     Nose: Nose normal.     Mouth/Throat:     Mouth: Mucous membranes are moist.  Eyes:     General: No scleral icterus.       Right eye: No discharge.        Left eye: No discharge.     Extraocular Movements: Extraocular movements intact.     Pupils: Pupils are equal, round, and reactive to light.  Cardiovascular:     Rate and Rhythm: Normal rate and regular rhythm.     Pulses: Normal pulses.     Heart sounds: Normal heart sounds.  Pulmonary:     Effort: Pulmonary effort is normal. No respiratory distress.     Breath sounds: Normal breath sounds.  Abdominal:     General: Abdomen is flat.     Tenderness: There is no abdominal tenderness.  Musculoskeletal:        General: Normal range of motion.     Cervical back: Normal range of motion.     Right lower leg: No edema.     Left lower leg: No edema.     Comments: Chronic abnormality to b/l hands  Skin:    General: Skin is warm and dry.     Capillary Refill: Capillary refill takes less than 2 seconds.     Comments: Wound to RLE, appears to be healing appropriately   Neurological:     Mental Status: She is lethargic and disoriented.     GCS: GCS eye subscore is 2. GCS verbal subscore is 2. GCS motor subscore is 4.     Comments: Initial neuro exam difficult to obtain secondary to altered mental status.  Patient unable to comply with neurologic exam appropriately.  Psychiatric:        Mood and Affect: Mood normal.        Behavior: Behavior normal.    ED Course/Procedures     Procedures  MDM  See note from PA from earlier today.   79 year old female with  history as above presented ER secondary to altered mental status.  History provided by daughter at bedside.  Reports that patient took her Lantus this morning, has been eating sparsely throughout the day.  She felt the patient was acting somewhat sleepy when they set out to drive to [facility] to visit family at college.  Patient fell asleep in the car multiple times, did not want to exit the vehicle once they arrive to the destination.  Around 1pm when they arrived patient was increasingly lethargic. Refusing to eat or get out of the vehicle.  She left her in the cardio grocery shopping and when she returned she was  not a better so they proceeded to drive back home from [place], [place].  Once they got back home, she was still acting abnormal and daughter brought to the emergency dept for evaluation.  Upon initial assessment patient is lethargic, responsiveness.  POC glucose was less than 30.  Given amp of D50.  Mental  status improved significantly following dextrose.  Following glucose replacement mental status is improved significantly.  NIH stroke scale of 0.  GCS 15, no focal deficits on neurologic exam.  Patient has poor recollection of the events that brought her to the emergency department.  Primary history provided by daughter at bedside.  Labs reviewed, glucose improved following D50 however dipped.  Will start patient on D10 infusion.  Cr worsened from baseline.  Continue IV fluids.  Potassium is not acutely elevated.  EKG without acute ischemia, CT imaging of the head nonacute.  Troponin negative.  Denies chest pain.  ACS unlikely.    Patient with intermittent cough over the past few days, question of chills at home.  No fever.  Chest x-ray with possible infiltrate.  Sent procalcitonin.  Start empiric antibiotics for pneumonia.  Patient with hyperglycemia in setting of long-acting insulin, possible pneumonia, dehydration.  Likely etiology of patient's encephalopathy.  Recommend  admission at this time.  Patient is agreeable. D/w hospitalist who accepts patient.    CRITICAL CARE Performed by: Jeanell Sparrow   Total critical care time: 34 minutes  Critical care time was exclusive of separately billable procedures and treating other patients.  Critical care was necessary to treat or prevent imminent or life-threatening deterioration.  Critical care was time spent personally by me on the following activities: development of treatment plan with patient and/or surrogate as well as nursing, discussions with consultants, evaluation of patient's response to treatment, examination of patient, obtaining history from patient or surrogate, ordering and performing treatments and interventions, ordering and review of laboratory studies, ordering and review of radiographic studies, pulse oximetry and re-evaluation of patient's condition.    Jeanell Sparrow, DO 03/29/21 2157

## 2021-03-29 NOTE — ED Notes (Signed)
ED TO INPATIENT HANDOFF REPORT  Name/Age/Gender Holly Weiss 80 y.o. female  Code Status Code Status History    Date Active Date Inactive Code Status Order ID Comments User Context   09/27/2019 0910 09/29/2019 1714 Full Code [id number]  Vianne Bulls, MD Inpatient   01/16/2014 1451 01/17/2014 1928 Full Code [id number]  Renette Butters, MD Inpatient   10/19/2013 2214 10/20/2013 1552 Full Code [id number]  Artis Delay, MD ED   07/12/2013 1312 07/15/2013 1535 Full Code [id number]  Benedetto Goad, PA-C Inpatient    Questions for Most Recent Historical Code Status (Order [id number])       Home/SNF/Other Home  Chief Complaint Hypoglycemia [E16.2]  Level of Care/Admitting Diagnosis ED Disposition    ED Disposition  Admit   Condition  --   Comment  Hospital Area: [facility] [id number]  Level of Care: Med-Surg [16]  May place patient in observation at [facility] or [place] if equivalent level of care is available:: Yes  Covid Evaluation: Confirmed COVID Negative  Diagnosis: Hypoglycemia [id number]  Admitting Physician: Eben Burow [id number]  Attending Physician: Eben Burow [id number]         Medical History Past Medical History:  Diagnosis Date  . ANEMIA-NOS 01/18/2008  . Arthritis   . 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 ASTHMA, WITH ACUTE EXACERBATION 11/29/2008  . Bilateral carpal tunnel syndrome   . Chronic pain syndrome 01/21/2010  . Dementia ([place])   . Depression   . DIABETIC  RETINOPATHY 05/30/2007  . DM, UNCOMPLICATED, TYPE II, UNCONTROLLED 05/30/2007  . Edema of both legs    takes Lasix  . Femur fracture, right ([place])   . GERD (gastroesophageal reflux disease)   . 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 History of blood transfusion    spine surgery  . History of kidney stones   . HYPERLIPIDEMIA 05/30/2007  . HYPERTENSION 10/31/2007  . LOW BACK PAIN 01/18/2008  . Migraine headache   . MORBID OBESITY, HX OF 05/30/2007  . NEPHROLITHIASIS, HX  OF 01/18/2008  . Numbness and tingling in hands   . Seasonal allergies   . Shortness of breath    with walking  . SHOULDER PAIN, BILATERAL 04/09/2009  . Spinal stenosis   . Urgency of urination     Allergies Allergies  Allergen Reactions  . Adhesive [Tape] Itching  . Endocet [Oxycodone-Acetaminophen] Itching  . Latex Itching  . Oxycodone-Acetaminophen Itching    IV Location/Drains/Wounds Patient Lines/Drains/Airways Status    Active Line/Drains/Airways    Name Placement date Placement time Site Days   Peripheral IV 03/29/21 18 G Right Antecubital 03/29/21  --  Antecubital  less than 1   Peripheral IV 03/29/21 20 G Left Antecubital 03/29/21  2014  Antecubital  less than 1   Incision (Closed) 01/16/14 Leg Right 01/16/14  1102  -- 2629          Labs/Imaging Results for orders placed or performed during the hospital encounter of 03/29/21 (from the past 48 hour(s))  Comprehensive metabolic panel     Status: Abnormal   Collection Time: 03/29/21  6:07 PM  Result Value Ref Range   Sodium 143 135 - 145 mmol/L   Potassium 3.5 3.5 - 5.1 mmol/L   Chloride 107 98 - 111 mmol/L   CO2 27 22 - 32 mmol/L   Glucose, Bld 29 (LL) 70 - 99 mg/dL    Comment: Glucose reference range applies only to samples taken after fasting for at least 8 hours. CRITICAL  RESULT CALLED TO, READ BACK BY AND VERIFIED WITH: Zyaire Mccleod,J RN '@2121'$  ON 03/29/21 JACKSON,K    BUN 47 (H) 8 - 23 mg/dL   Creatinine, Ser 1.98 (H) 0.44 - 1.00 mg/dL   Calcium 9.4 8.9 - 10.3 mg/dL   Total Protein 7.5 6.5 - 8.1 g/dL   Albumin 3.1 (L) 3.5 - 5.0 g/dL   AST 24 15 - 41 U/L   ALT 17 0 - 44 U/L   Alkaline Phosphatase 65 38 - 126 U/L   Total Bilirubin 0.9 0.3 - 1.2 mg/dL   GFR, Estimated 25 (L) >60 mL/min    Comment: (NOTE) Calculated using the CKD-EPI Creatinine Equation (2021)    Anion gap 9 5 - 15    Comment: Performed at [facility], [place] [address], [place], [place] [address]  Lipase, blood     Status:  None   Collection Time: 03/29/21  6:07 PM  Result Value Ref Range   Lipase 21 11 - 51 U/L    Comment: Performed at [facility], [place] [address] [place], [place] [address]  CBC with Differential     Status: Abnormal   Collection Time: 03/29/21  6:07 PM  Result Value Ref Range   WBC 9.6 4.0 - 10.5 K/uL   RBC 3.35 (L) 3.87 - 5.11 MIL/uL   Hemoglobin 9.9 (L) 12.0 - 15.0 g/dL   HCT 31.4 (L) 36.0 - 46.0 %   MCV 93.7 80.0 - 100.0 fL   MCH 29.6 26.0 - 34.0 pg   MCHC 31.5 30.0 - 36.0 g/dL   RDW 16.2 (H) 11.5 - 15.5 %   Platelets 248 150 - 400 K/uL   nRBC 0.0 0.0 - 0.2 %   Neutrophils Relative % 55 %   Neutro Abs 5.3 1.7 - 7.7 K/uL   Lymphocytes Relative 24 %   Lymphs Abs 2.3 0.7 - 4.0 K/uL   Monocytes Relative 13 %   Monocytes Absolute 1.2 (H) 0.1 - 1.0 K/uL   Eosinophils Relative 7 %   Eosinophils Absolute 0.7 (H) 0.0 - 0.5 K/uL   Basophils Relative 1 %   Basophils Absolute 0.1 0.0 - 0.1 K/uL   Immature Granulocytes 0 %   Abs Immature Granulocytes 0.04 0.00 - 0.07 K/uL    Comment: Performed at [facility], [place] [address] [place], [place] [address]  Troponin I (High Sensitivity)     Status: None   Collection Time: 03/29/21  6:07 PM  Result Value Ref Range   Troponin I (High Sensitivity) 13 <18 ng/L    Comment: (NOTE) Elevated high sensitivity troponin I (hsTnI) values and significant  changes across serial measurements may suggest ACS but many other  chronic and acute conditions are known to elevate hsTnI results.  Refer to the Links section for chest pain algorithms and additional  guidance. Performed at [facility], [place] [address], [place], [place] [address]   Resp Panel by RT-PCR (Flu A&B, Covid) Nasopharyngeal Swab     Status: None   Collection Time: 03/29/21  6:35 PM   Specimen: Nasopharyngeal Swab; Nasopharyngeal(NP) swabs in vial transport medium  Result Value Ref Range   SARS Coronavirus 2 by RT PCR NEGATIVE  NEGATIVE    Comment: (NOTE) SARS-CoV-2 target nucleic acids are NOT DETECTED.  The SARS-CoV-2 RNA is generally detectable in upper respiratory specimens during the acute phase of infection. The lowest concentration of SARS-CoV-2 viral copies this assay can detect is 138 copies/mL. A negative  result does not preclude SARS-Cov-2 infection and should not be used as the sole basis for treatment or other patient management decisions. A negative result may occur with  improper specimen collection/handling, submission of specimen other than nasopharyngeal swab, presence of viral mutation(s) within the areas targeted by this assay, and inadequate number of viral copies(<138 copies/mL). A negative result must be combined with clinical observations, patient history, and epidemiological information. The expected result is Negative.  Fact Sheet for Patients:  EntrepreneurPulse.com.au  Fact Sheet for Healthcare Providers:  IncredibleEmployment.be  This test is no t yet approved or cleared by the [place] FDA and  has been authorized for detection and/or diagnosis of SARS-CoV-2 by FDA under an Emergency Use Authorization (EUA). This EUA will remain  in effect (meaning this test can be used) for the duration of the COVID-19 declaration under Section 564(b)(1) of the Act, 21 U.S.C.section 360bbb-3(b)(1), unless the authorization is terminated  or revoked sooner.       Influenza A by PCR NEGATIVE NEGATIVE   Influenza B by PCR NEGATIVE NEGATIVE    Comment: (NOTE) The Xpert Xpress SARS-CoV-2/FLU/RSV plus assay is intended as an aid in the diagnosis of influenza from Nasopharyngeal swab specimens and should not be used as a sole basis for treatment. Nasal washings and aspirates are unacceptable for Xpert Xpress SARS-CoV-2/FLU/RSV testing.  Fact Sheet for Patients: EntrepreneurPulse.com.au  Fact Sheet for Healthcare  Providers: IncredibleEmployment.be  This test is not yet approved or cleared by the [place] FDA and has been authorized for detection and/or diagnosis of SARS-CoV-2 by FDA under an Emergency Use Authorization (EUA). This EUA will remain in effect (meaning this test can be used) for the duration of the COVID-19 declaration under Section 564(b)(1) of the Act, 21 U.S.C. section 360bbb-3(b)(1), unless the authorization is terminated or revoked.  Performed at [facility], [place] [address] [place], [place] [address]   I-stat chem 8, ED (not at [facility] or [facility])     Status: Abnormal   Collection Time: 03/29/21  6:44 PM  Result Value Ref Range   Sodium 145 135 - 145 mmol/L   Potassium 3.6 3.5 - 5.1 mmol/L   Chloride 107 98 - 111 mmol/L   BUN 40 (H) 8 - 23 mg/dL   Creatinine, Ser 2.00 (H) 0.44 - 1.00 mg/dL   Glucose, Bld 117 (H) 70 - 99 mg/dL    Comment: Glucose reference range applies only to samples taken after fasting for at least 8 hours.   Calcium, Ion 1.25 1.15 - 1.40 mmol/L   TCO2 30 22 - 32 mmol/L   Hemoglobin 8.8 (L) 12.0 - 15.0 g/dL   HCT 26.0 (L) 36.0 - 46.0 %  CBG monitoring, ED     Status: Abnormal   Collection Time: 03/29/21  7:28 PM  Result Value Ref Range   Glucose-Capillary 123 (H) 70 - 99 mg/dL    Comment: Glucose reference range applies only to samples taken after fasting for at least 8 hours.  Troponin I (High Sensitivity)     Status: None   Collection Time: 03/29/21  8:13 PM  Result Value Ref Range   Troponin I (High Sensitivity) 11 <18 ng/L    Comment: (NOTE) Elevated high sensitivity troponin I (hsTnI) values and significant  changes across serial measurements may suggest ACS but many other  chronic and acute conditions are known to elevate hsTnI results.  Refer to the "Links" section for chest pain algorithms and additional  guidance. Performed at [name]  [place] [address] [place], [place]  [address]   POC CBG, ED     Status: Abnormal   Collection Time: 03/29/21  9:43 PM  Result Value Ref Range   Glucose-Capillary 119 (H) 70 - 99 mg/dL    Comment: Glucose reference range applies only to samples taken after fasting for at least 8 hours.   CT Head Wo Contrast  Result Date: 03/29/2021 CLINICAL DATA:  Altered mental status. EXAM: CT HEAD WITHOUT CONTRAST TECHNIQUE: Contiguous axial images were obtained from the base of the skull through the vertex without intravenous contrast. COMPARISON:  September 27, 2019 FINDINGS: Brain: There is mild cerebral atrophy with widening of the extra-axial spaces and ventricular dilatation. There are areas of decreased attenuation within the white matter tracts of the supratentorial brain, consistent with microvascular disease changes. Vascular: No hyperdense vessel or unexpected calcification. Skull: Normal. Negative for fracture or focal lesion. Sinuses/Orbits: Chronic diffuse calvarial thickening is seen with stable appearing mixed lytic and sclerotic areas. Other: None. IMPRESSION: 1. Generalized cerebral atrophy. 2. No acute intracranial abnormality. Electronically Signed   By: Virgina Norfolk M.D.   On: 03/29/2021 19:06   DG Chest Portable 1 View  Result Date: 03/29/2021 CLINICAL DATA:  Hypoglycemia. EXAM: PORTABLE CHEST 1 VIEW COMPARISON:  September 27, 2019 FINDINGS: Multiple sternal wires are seen. Low lung volumes are noted. Mild areas of atelectasis and/or infiltrate are seen along the infrahilar regions, bilaterally. There is no evidence of a pleural effusion or pneumothorax. The heart size and mediastinal contours are within normal limits. Multilevel degenerative changes seen throughout the thoracic spine. IMPRESSION: 1. Evidence of prior median sternotomy. 2. Mild bilateral infrahilar atelectasis and/or infiltrate. Electronically Signed   By: Virgina Norfolk M.D.   On: 03/29/2021 18:44    Pending Labs Unresulted Labs (From admission, onward)     Start     Ordered   03/29/21 2135  Procalcitonin - Baseline  ONCE - STAT,   STAT        03/29/21 2134   03/29/21 2038  Urinalysis, Routine w reflex microscopic  Once,   R        03/29/21 2038          Vitals/Pain Today's Vitals   03/29/21 2101 03/29/21 2115 03/29/21 2130 03/29/21 2145  BP: (!) 135/57 (!) 145/64 (!) 129/47 125/63  Pulse: (!) 57 (!) 56 (!) 55 60  Resp: (!) 9 12 (!) 8 (!) 22  Temp:      SpO2: 98% 99% 97% 97%    Isolation Precautions No active isolations  Medications Medications  dextrose 10 % infusion ( Intravenous New Bag/Given 03/29/21 1913)  cefTRIAXone (ROCEPHIN) 1 g in sodium chloride 0.9 % 100 mL IVPB (1 g Intravenous New Bag/Given 03/29/21 2200)  azithromycin (ZITHROMAX) 500 mg in sodium chloride 0.9 % 250 mL IVPB (has no administration in time range)    Mobility non-ambulatory

## 2021-03-29 NOTE — ED Notes (Signed)
Pt changed into hospital gown. Linens replaced. Purwick applied. Pt bathed and has barrier cream applied to wounds.

## 2021-03-29 NOTE — ED Triage Notes (Signed)
Pt arrives via POV with family. Pt was minimally responsive in the back of the vehicle. Family was concerned for a stroke. BG was 24 once the patient was brought from car to room. Amp of d50 was administered and now patient is more oriented and following commands.

## 2021-03-29 NOTE — ED Provider Notes (Signed)
[facility] DEPT Provider Note   CSN: [id number] Arrival date & time: 03/29/21  1805     History Chief Complaint  Patient presents with   Altered Mental Status    Holly Weiss is a 80 y.o. female with past medical history of dementia, depression, diabetes, leg edema, hypertension, obesity, CKD, who presents today for evaluation of slurred speech and altered mental status. Patient's history is given by daughter.  They reportedly were driving back from [place] and she noticed that the patient was more sleepy for the 3-hour ride.  She believes that she was tired and allowed her to rest.  She got food for the patient and when she went to wake her up to eat it she was unable to awaken patient and brought her here.  Last oral intake for patient was this morning.  Level 5 caveat altered mental status  Patient was removed from the car by ED staff.   HPI     Past Medical History:  Diagnosis Date   ANEMIA-NOS 01/18/2008   Arthritis    ARTHRITIS, GENERALIZED 08/18/2010   ASTHMA, WITH ACUTE EXACERBATION 11/29/2008   Bilateral carpal tunnel syndrome    Chronic pain syndrome 01/21/2010   Dementia ([place])    Depression    DIABETIC  RETINOPATHY 24/40/1027   DM, UNCOMPLICATED, TYPE II, UNCONTROLLED 05/30/2007   Edema of both legs    takes Lasix   Femur fracture, right (HCC)    GERD (gastroesophageal reflux disease)    GLAUCOMA NOS 05/30/2007   History of blood transfusion    spine surgery   History of kidney stones    HYPERLIPIDEMIA 05/30/2007   HYPERTENSION 10/31/2007   LOW BACK PAIN 01/18/2008   Migraine headache    MORBID OBESITY, HX OF 05/30/2007   NEPHROLITHIASIS, HX OF 01/18/2008   Numbness and tingling in hands    Seasonal allergies    Shortness of breath    with walking   SHOULDER PAIN, BILATERAL 04/09/2009   Spinal stenosis    Urgency of urination     Patient Active Problem List   Diagnosis Date Noted   Left leg cellulitis 01/01/2021   Leg  wound, left 01/01/2021   Decubitus ulcer, buttock 01/01/2021   Spondylolisthesis of lumbar region 07/10/2020   Unspecified osteoarthritis, unspecified site 07/10/2020   Spinal stenosis, cervical region 07/10/2020   Spinal stenosis, thoracolumbar region 07/10/2020   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   Dementia ([place])    Cervical stenosis of spinal canal 09/28/2019   Frequent falls 09/27/2019   Fracture of 5th metatarsal 09/27/2019   Cervical spinal stenosis 09/27/2019   Cord compression syndrome ([place]) 09/27/2019   Right shoulder pain 11/02/2018   Right rotator cuff tear 11/02/2018   Gait disorder 11/02/2018   AKI (acute kidney injury) ([place]) 02/09/2018   Stasis dermatitis 11/06/2016   CKD (chronic kidney disease), stage III ([place]) 09/24/2016   Rash 09/24/2016   Sudden right hearing loss 02/25/2016   Vertigo 02/25/2016   Multiple open wounds of lower leg 01/28/2016   Pain of right heel 01/28/2016   Osteoporosis 06/04/2015   Disorder of bone and cartilage 25/36/6440   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[place]) 01/08/2015   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   CAD in native artery 11/16/2014   EKG, abnormal 08/21/2014   Dystonia 07/20/2014   S/P hardware removal 07/19/2014   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 07/19/2014   Lower  leg edema 06/22/2014   History of total right knee replacement 06/05/2014   Tibial fracture 01/16/2014   Cellulitis of leg, right 11/17/2013   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   Other dysphagia 11/04/2013   Morbid obesity ([place]) 10/22/2013   Diabetes type 2, uncontrolled ([place]) 10/21/2013   DJD (degenerative joint disease) of knee 07/12/2013   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   Anxiety and depression 02/14/2013   Visual hallucinations 02/14/2013   Pain in joint of right knee 02/14/2013   Uncontrolled diabetes mellitus with hyperglycemia, with long-term current use of insulin (HCC)     Neck pain, acute 07/21/2011   Right knee pain 07/21/2011   Encounter for well adult exam with abnormal findings 01/27/2011   Edema 09/09/2010   KNEE PAIN, BILATERAL 09/01/2010   Arthropathy, multiple sites 08/18/2010   Chronic pain syndrome 01/21/2010   DYSPEPSIA 01/21/2010   FATIGUE 01/21/2010   DYSPNEA ON EXERTION 01/21/2010   MYOCARDIAL PERFUSION SCAN, WITH STRESS TEST, ABNORMAL 05/01/2009   SHOULDER PAIN, BILATERAL 04/09/2009   CHEST PAIN 04/09/2009   NAUSEA 04/09/2009   JOINT EFFUSION, LEFT KNEE 11/29/2008   Abdominal pain, epigastric 08/20/2008   ANEMIA-NOS 01/18/2008   Chronic diastolic congestive heart failure ([place]) 01/18/2008   Allergic rhinitis 01/18/2008   LOW BACK PAIN 01/18/2008   NEPHROLITHIASIS, HX OF 01/18/2008   Essential hypertension 10/31/2007   DIABETIC  RETINOPATHY 05/30/2007   Hyperlipidemia 05/30/2007   MIGRAINE HEADACHE 05/30/2007   Unspecified glaucoma 05/30/2007   Asthma 05/30/2007   MORBID OBESITY, HX OF 05/30/2007    Past Surgical History:  Procedure Laterality Date   BACK SURGERY     CATARACT EXTRACTION     CHOLECYSTECTOMY     lumbar disc surgury     ORIF TIBIA FRACTURE Right 01/16/2014   Procedure: RIGHT TIBIA REPAIR NON-UNION/MALUNION TIBIA WITH SLIDING GRAFT;  Surgeon: Renette Butters, MD;  Location: [facility];  Service: Orthopedics;  Laterality: Right;   THORACIC FUSION     TOTAL KNEE ARTHROPLASTY WITH REVISION COMPONENTS Right 07/12/2013   Procedure: TOTAL KNEE ARTHROPLASTY WITH TIBIA REVISION COMPONENTS;  Surgeon: Ninetta Lights, MD;  Location: [facility];  Service: Orthopedics;  Laterality: Right;   TUBAL LIGATION       OB History     Gravida  5   Para  5   Term      Preterm      AB  0   Living  5      SAB      IAB      Ectopic      Multiple      Live Births              Family History  Problem Relation Age of Onset   Diabetes Mother    Heart disease Mother    Dementia Mother    Lung cancer Father    Heart  disease Father    Diabetes Sister    Depression Sister     Social History   Tobacco Use   Smoking status: Never   Smokeless tobacco: Current  Substance Use Topics   Alcohol use: No   Drug use: No    Home Medications Prior to Admission medications   Medication Sig Start Date End Date Taking? Authorizing Provider  albuterol (VENTOLIN HFA) 108 (90 Base) MCG/ACT inhaler INHALE 2 PUFFS INTO THE LUNGS TWICE DAILY AS NEEDED FOR WHEEZING OR SHORTNESS OF BREATH Patient taking differently: Inhale 2 puffs into the lungs every 6 (  six) hours as needed for wheezing or shortness of breath. 04/05/18  Yes Biagio Borg, MD  aspirin 81 MG tablet Take 1 tablet (81 mg total) by mouth daily. 01/17/14  Yes Renette Butters, MD  cholecalciferol (VITAMIN D3) 25 MCG (1000 UNIT) tablet Take 1,000 Units by mouth 2 (two) times daily.   Yes [provider]  citalopram (CELEXA) 10 MG tablet Take 1 tablet (10 mg total) by mouth daily. 01/01/21 01/01/22 Yes Biagio Borg, MD  donepezil (ARICEPT) 5 MG tablet Take 1 tablet (5 mg total) by mouth at bedtime. Patient taking differently: Take 2.5 mg by mouth at bedtime. 02/25/21  Yes Biagio Borg, MD  doxycycline (VIBRAMYCIN) 100 MG capsule Take 1 capsule (100 mg total) by mouth 2 (two) times daily. One po bid x 7 days Patient taking differently: Take 100 mg by mouth 2 (two) times daily. Start date : 03/21/21 03/20/21  Yes Drenda Freeze, MD  furosemide (LASIX) 40 MG tablet TAKE 1 TABLET BY MOUTH EVERY DAY Patient taking differently: Take 40 mg by mouth daily. 01/01/21  Yes Biagio Borg, MD  haloperidol (HALDOL) 1 MG tablet Take 1 tablet (1 mg total) by mouth at bedtime. Patient taking differently: Take 1 mg by mouth daily. 02/25/21  Yes Biagio Borg, MD  insulin glargine (LANTUS SOLOSTAR) 100 UNIT/ML Solostar Pen Inject 30 Units into the skin daily. 01/01/21  Yes Biagio Borg, MD  metoprolol succinate (TOPROL-XL) 100 MG 24 hr tablet TAKE 1 TABLET BY MOUTH DAILY WITH  OR IMMEDIATELY FOLLOWING A MEAL Patient taking differently: Take 100 mg by mouth daily. 04/24/20  Yes Biagio Borg, MD  Multiple Vitamins-Minerals (ONE-A-DAY EXTRAS ANTIOXIDANT PO) Take 1 tablet by mouth daily.   Yes [provider]  naproxen sodium (ALEVE) 220 MG tablet Take 440 mg by mouth daily as needed (pain).   Yes [provider]  ondansetron (ZOFRAN ODT) 4 MG disintegrating tablet 68m ODT q4 hours prn nausea/vomit Patient taking differently: Take 4 mg by mouth every 8 (eight) hours as needed for nausea or vomiting. 03/20/21  Yes YDrenda Freeze MD  QUEtiapine (SEROQUEL) 100 MG tablet Take 100 mg by mouth at bedtime. 03/28/21  Yes [provider]  rosuvastatin (CRESTOR) 20 MG tablet TAKE 1/2 TABLET BY MOUTH EVERY DAY Patient taking differently: Take 10 mg by mouth daily. 12/11/20  Yes JBiagio Borg MD  tiZANidine (ZANAFLEX) 2 MG tablet Take 1 tablet (2 mg total) by mouth every 6 (six) hours as needed for muscle spasms. 03/18/21  Yes JBiagio Borg MD  traMADol (ULTRAM) 50 MG tablet TAKE 1 TABLET(50 MG) BY MOUTH EVERY 6 HOURS AS NEEDED Patient taking differently: Take 50 mg by mouth every 6 (six) hours as needed for moderate pain. 03/25/21  Yes JBiagio Borg MD  Alcohol Swabs (ALCOHOL WIPES) 70 % PADS Apply topically. 03/13/20   [provider]  Blood Glucose Monitoring Suppl ([place] w/Device KIT Use as directed once daily to check blood sugar.  Diagnosis code 250.02 10/09/19   JBiagio Borg MD  Continuous Blood Gluc Receiver (FREESTYLE LIBRE 14 DAY READER) DEVI Apply 1 Device topically daily. Use as directed daily E11.9 01/22/20   JBiagio Borg MD  Continuous Blood Gluc Sensor (FREESTYLE LIBRE 14 DAY SENSOR) MISC Apply 1 Device topically every 14 (fourteen) days. E11.9 01/22/20   JBiagio Borg MD  glucose blood (ONE TOUCH TEST STRIPS) test strip Use as directed once  daily to check blood sugar.  Diagnosis code E11.9 02/25/16   Biagio Borg, MD   Insulin Pen Needle (B-D ULTRAFINE III SHORT PEN) 31G X 8 MM MISC USE TO CHECK BLOOD SUGERS TWICE DAILY 02/11/21   Biagio Borg, MD  melatonin 3 MG TABS tablet Take by mouth. Patient not taking: No sig reported 03/08/20   [provider]  ONE TOUCH LANCETS MISC Use as directed once daily to check blood sugar. DX E11.09 06/28/17   Biagio Borg, MD    Allergies    Adhesive [tape], Endocet [oxycodone-acetaminophen], Latex, and Oxycodone-acetaminophen  Review of Systems   Review of Systems  Unable to perform ROS: Patient nonverbal   Physical Exam Updated Vital Signs BP (!) 155/53   Pulse (!) 59   Temp 98.4 F (36.9 C)   Resp 13   SpO2 100%   Physical Exam Vitals and nursing note reviewed.  Constitutional:      Appearance: She is ill-appearing and diaphoretic.     Comments: Patient is obtunded, she awakens with deep sternal rub, is able to say her name before she again goes obtunded  HENT:     Head: Normocephalic and atraumatic.  Cardiovascular:     Rate and Rhythm: Normal rate.     Pulses: Normal pulses.  Pulmonary:     Effort: Pulmonary effort is normal.  Abdominal:     General: There is no distension.  Musculoskeletal:     Cervical back: Normal range of motion and neck supple.     Right lower leg: Edema present.     Left lower leg: Edema present.  Skin:    Comments: Patient is warm and diaphoretic  Neurological:     GCS: GCS eye subscore is 2. GCS verbal subscore is 3. GCS motor subscore is 5.     Comments: Patient has left-sided facial droop.    ED Results / Procedures / Treatments   Labs (all labs ordered are listed, but only abnormal results are displayed) Labs Reviewed  I-STAT CHEM 8, ED - Abnormal; Notable for the following components:      Result Value   BUN 40 (*)    Creatinine, Ser 2.00 (*)    Glucose, Bld 117 (*)    Hemoglobin 8.8 (*)    HCT 26.0 (*)    All other components within normal limits  CBG MONITORING, ED - Abnormal; Notable for the  following components:   Glucose-Capillary 123 (*)    All other components within normal limits  RESP PANEL BY RT-PCR (FLU A&B, COVID) [id number]  COMPREHENSIVE METABOLIC PANEL  LIPASE, BLOOD  CBC WITH DIFFERENTIAL/PLATELET  URINALYSIS, ROUTINE W REFLEX MICROSCOPIC  TROPONIN I (HIGH SENSITIVITY)  TROPONIN I (HIGH SENSITIVITY)    EKG None  Radiology CT Head Wo Contrast  Result Date: 03/29/2021 CLINICAL DATA:  Altered mental status. EXAM: CT HEAD WITHOUT CONTRAST TECHNIQUE: Contiguous axial images were obtained from the base of the skull through the vertex without intravenous contrast. COMPARISON:  September 27, 2019 FINDINGS: Brain: There is mild cerebral atrophy with widening of the extra-axial spaces and ventricular dilatation. There are areas of decreased attenuation within the white matter tracts of the supratentorial brain, consistent with microvascular disease changes. Vascular: No hyperdense vessel or unexpected calcification. Skull: Normal. Negative for fracture or focal lesion. Sinuses/Orbits: Chronic diffuse calvarial thickening is seen with stable appearing mixed lytic and sclerotic areas. Other: None. IMPRESSION: 1. Generalized cerebral atrophy. 2. No acute intracranial abnormality. Electronically Signed   By:  Virgina Norfolk M.D.   On: 03/29/2021 19:06   DG Chest Portable 1 View  Result Date: 03/29/2021 CLINICAL DATA:  Hypoglycemia. EXAM: PORTABLE CHEST 1 VIEW COMPARISON:  September 27, 2019 FINDINGS: Multiple sternal wires are seen. Low lung volumes are noted. Mild areas of atelectasis and/or infiltrate are seen along the infrahilar regions, bilaterally. There is no evidence of a pleural effusion or pneumothorax. The heart size and mediastinal contours are within normal limits. Multilevel degenerative changes seen throughout the thoracic spine. IMPRESSION: 1. Evidence of prior median sternotomy. 2. Mild bilateral infrahilar atelectasis and/or infiltrate. Electronically Signed   By:  Virgina Norfolk M.D.   On: 03/29/2021 18:44    Procedures .Critical Care  Date/Time: 03/29/2021 8:32 PM Performed by: Lorin Glass, PA-C Authorized by: Lorin Glass, PA-C   Critical care provider statement:    Critical care time (minutes):  45   Critical care time was exclusive of:  Separately billable procedures and treating other patients and teaching time   Critical care was necessary to treat or prevent imminent or life-threatening deterioration of the following conditions:  Endocrine crisis and shock   Critical care was time spent personally by me on the following activities:  Discussions with consultants, evaluation of patient's response to treatment, examination of patient, ordering and performing treatments and interventions, ordering and review of laboratory studies, ordering and review of radiographic studies, pulse oximetry, re-evaluation of patient's condition, obtaining history from patient or surrogate and review of old charts   I assumed direction of critical care for this patient from another provider in my specialty: no   Comments:     Hypoglycemia requiring IV Dextrose   Medications Ordered in ED Medications  dextrose 10 % infusion ( Intravenous New Bag/Given 03/29/21 1913)  Patient given one amp of 50 on arrival (about 1807) as her blood sugar was 24.   ED Course  I have reviewed the triage vital signs and the nursing notes.  Pertinent labs & imaging results that were available during my care of the patient were reviewed by me and considered in my medical decision making (see chart for details).    MDM Rules/Calculators/A&P                           Patient is a 80 year old woman who presents today for evaluation of having slurred speech and then being difficult to wake up during a 3-hour car ride.  On arrival patient was minimally responsive, would only respond with deep sternal rub.  She was diaphoretic and hypoxic into the high 80s.  CBG was  obtained and her sugar was 24.  IV was established and she was given 1 amp of D50 with improvement in her mental status.  Initially she did appear to have some mild left-sided facial droop, however as her sugar improved this rapidly went away, and she was able to speak without slurred speech, was able to move her bilateral arms and legs with intact strength, became ANO x4 and did not have any facial droop.  Her blood sugar normalized and she awoke she was no longer requiring oxygen therefore this was discontinued.  Patient reports that recently she has generally been feeling unwell. Given that she did have some facial droop CT head was ordered without acute abnormalities.  Given that this fully resolved with the treatment of her hypoglycemia I suspect that this was caused by hypoglycemia.  Chest x-ray shows mild bilateral infrahilar atelectasis  versus infiltrate. Chart review shows that patient recently did have blood cultures that appeared to be concerning for staph species.  At shift change care was transferred to Dr. Earl Lites who will follow pending studies, re-evaulate and determine disposition.    Note: Portions of this report may have been transcribed using voice recognition software. Every effort was made to ensure accuracy; however, inadvertent computerized transcription errors may be present   Final Clinical Impression(s) / ED Diagnoses Final diagnoses:  Hypoglycemia    Rx / DC Orders ED Discharge Orders     None        Lorin Glass, PA-C 03/29/21 2037    Jeanell Sparrow, DO 03/30/21 [phone]

## 2021-03-29 NOTE — ED Notes (Signed)
ED provider at the bedside to evaluate.  

## 2021-03-29 NOTE — ED Notes (Signed)
D50 given per ED provider's verbal order d/t pt's POC BG of 24.

## 2021-03-30 ENCOUNTER — Encounter: Payer: Self-pay | Admitting: Internal Medicine

## 2021-03-30 DIAGNOSIS — Z794 Long term (current) use of insulin: Secondary | ICD-10-CM

## 2021-03-30 DIAGNOSIS — G9340 Encephalopathy, unspecified: Secondary | ICD-10-CM

## 2021-03-30 DIAGNOSIS — E11649 Type 2 diabetes mellitus with hypoglycemia without coma: Principal | ICD-10-CM

## 2021-03-30 DIAGNOSIS — G301 Alzheimer's disease with late onset: Secondary | ICD-10-CM | POA: Diagnosis not present

## 2021-03-30 DIAGNOSIS — E860 Dehydration: Secondary | ICD-10-CM | POA: Diagnosis not present

## 2021-03-30 DIAGNOSIS — N1831 Chronic kidney disease, stage 3a: Secondary | ICD-10-CM

## 2021-03-30 DIAGNOSIS — I10 Essential (primary) hypertension: Secondary | ICD-10-CM

## 2021-03-30 DIAGNOSIS — E162 Hypoglycemia, unspecified: Secondary | ICD-10-CM | POA: Diagnosis not present

## 2021-03-30 DIAGNOSIS — F0280 Dementia in other diseases classified elsewhere without behavioral disturbance: Secondary | ICD-10-CM

## 2021-03-30 LAB — GLUCOSE, CAPILLARY
Glucose-Capillary: 132 mg/dL — ABNORMAL HIGH (ref 70–99)
Glucose-Capillary: 141 mg/dL — ABNORMAL HIGH (ref 70–99)
Glucose-Capillary: 185 mg/dL — ABNORMAL HIGH (ref 70–99)
Glucose-Capillary: 193 mg/dL — ABNORMAL HIGH (ref 70–99)
Glucose-Capillary: 212 mg/dL — ABNORMAL HIGH (ref 70–99)
Glucose-Capillary: 230 mg/dL — ABNORMAL HIGH (ref 70–99)
Glucose-Capillary: 95 mg/dL (ref 70–99)

## 2021-03-30 LAB — CBC
HCT: 31.7 % — ABNORMAL LOW (ref 36.0–46.0)
Hemoglobin: 9.7 g/dL — ABNORMAL LOW (ref 12.0–15.0)
MCH: 29.4 pg (ref 26.0–34.0)
MCHC: 30.6 g/dL (ref 30.0–36.0)
MCV: 96.1 fL (ref 80.0–100.0)
Platelets: 211 10*3/uL (ref 150–400)
RBC: 3.3 MIL/uL — ABNORMAL LOW (ref 3.87–5.11)
RDW: 16.1 % — ABNORMAL HIGH (ref 11.5–15.5)
WBC: 8.2 10*3/uL (ref 4.0–10.5)
nRBC: 0 % (ref 0.0–0.2)

## 2021-03-30 LAB — URINALYSIS, ROUTINE W REFLEX MICROSCOPIC
Bilirubin Urine: NEGATIVE
Glucose, UA: NEGATIVE mg/dL
Hgb urine dipstick: NEGATIVE
Ketones, ur: NEGATIVE mg/dL
Nitrite: NEGATIVE
Protein, ur: NEGATIVE mg/dL
Specific Gravity, Urine: 1.014 (ref 1.005–1.030)
pH: 5 (ref 5.0–8.0)

## 2021-03-30 LAB — BASIC METABOLIC PANEL
Anion gap: 9 (ref 5–15)
BUN: 43 mg/dL — ABNORMAL HIGH (ref 8–23)
CO2: 23 mmol/L (ref 22–32)
Calcium: 9.3 mg/dL (ref 8.9–10.3)
Chloride: 109 mmol/L (ref 98–111)
Creatinine, Ser: 1.72 mg/dL — ABNORMAL HIGH (ref 0.44–1.00)
GFR, Estimated: 30 mL/min — ABNORMAL LOW (ref >60)
Glucose, Bld: 241 mg/dL — ABNORMAL HIGH (ref 70–99)
Potassium: 3.8 mmol/L (ref 3.5–5.1)
Sodium: 141 mmol/L (ref 135–145)

## 2021-03-30 LAB — HEMOGLOBIN A1C
Hgb A1c MFr Bld: 7.6 % — ABNORMAL HIGH (ref 4.8–5.6)
Mean Plasma Glucose: 171.42 mg/dL

## 2021-03-30 MED ORDER — ACETAMINOPHEN 325 MG PO TABS
650.0000 mg | ORAL_TABLET | Freq: Four times a day (QID) | ORAL | Status: DC | PRN
  Administered 2021-03-30 – 2021-04-02 (×3): 650 mg via ORAL
  Filled 2021-03-30 (×3): qty 2

## 2021-03-30 MED ORDER — ACETAMINOPHEN 650 MG RE SUPP: 650.0000 mg | Freq: Four times a day (QID) | RECTAL | Status: DC | PRN

## 2021-03-30 MED ORDER — METOPROLOL SUCCINATE ER 50 MG PO TB24
100.0000 mg | ORAL_TABLET | Freq: Every day | ORAL | Status: DC
  Administered 2021-03-30 – 2021-04-02 (×4): 100 mg via ORAL
  Filled 2021-03-30 (×4): qty 2

## 2021-03-30 MED ORDER — ATORVASTATIN CALCIUM 40 MG PO TABS
40.0000 mg | ORAL_TABLET | Freq: Every day | ORAL | Status: DC
  Administered 2021-03-30 – 2021-04-02 (×4): 40 mg via ORAL
  Filled 2021-03-30 (×4): qty 1

## 2021-03-30 MED ORDER — ASPIRIN 81 MG PO CHEW
81.0000 mg | CHEWABLE_TABLET | Freq: Every day | ORAL | Status: DC
  Administered 2021-03-30 – 2021-04-02 (×4): 81 mg via ORAL
  Filled 2021-03-30 (×4): qty 1

## 2021-03-30 MED ORDER — FUROSEMIDE 40 MG PO TABS
40.0000 mg | ORAL_TABLET | Freq: Every day | ORAL | Status: DC
  Administered 2021-03-30: 40 mg via ORAL
  Filled 2021-03-30: qty 1

## 2021-03-30 MED ORDER — DONEPEZIL HCL 5 MG PO TABS
2.5000 mg | ORAL_TABLET | Freq: Every day | ORAL | Status: DC
  Administered 2021-03-30 – 2021-04-02 (×5): 2.5 mg via ORAL
  Filled 2021-03-30 (×5): qty 0.5

## 2021-03-30 MED ORDER — HEPARIN SODIUM (PORCINE) 5000 UNIT/ML IJ SOLN
5000.0000 [IU] | Freq: Three times a day (TID) | INTRAMUSCULAR | Status: DC
  Administered 2021-03-30 – 2021-04-03 (×13): 5000 [IU] via SUBCUTANEOUS
  Filled 2021-03-30 (×13): qty 1

## 2021-03-30 MED ORDER — INSULIN ASPART 100 UNIT/ML IJ SOLN
0.0000 [IU] | Freq: Three times a day (TID) | INTRAMUSCULAR | Status: DC
  Administered 2021-03-30: 1 [IU] via SUBCUTANEOUS
  Administered 2021-03-31: 3 [IU] via SUBCUTANEOUS
  Administered 2021-04-01 – 2021-04-02 (×3): 1 [IU] via SUBCUTANEOUS
  Administered 2021-04-02: 2 [IU] via SUBCUTANEOUS
  Administered 2021-04-02: 1 [IU] via SUBCUTANEOUS

## 2021-03-30 MED ORDER — ALBUTEROL SULFATE (2.5 MG/3ML) 0.083% IN NEBU: 3.0000 mL | INHALATION_SOLUTION | Freq: Four times a day (QID) | RESPIRATORY_TRACT | Status: DC | PRN

## 2021-03-30 MED ORDER — CITALOPRAM HYDROBROMIDE 20 MG PO TABS
10.0000 mg | ORAL_TABLET | Freq: Every day | ORAL | Status: DC
  Administered 2021-03-30 – 2021-04-02 (×4): 10 mg via ORAL
  Filled 2021-03-30 (×4): qty 1

## 2021-03-30 MED ORDER — DEXTROSE-NACL 5-0.9 % IV SOLN: INTRAVENOUS | Status: DC

## 2021-03-30 MED ORDER — INSULIN ASPART 100 UNIT/ML IJ SOLN
0.0000 [IU] | Freq: Every day | INTRAMUSCULAR | Status: DC
  Administered 2021-04-02: 2 [IU] via SUBCUTANEOUS

## 2021-03-30 MED ORDER — QUETIAPINE FUMARATE 100 MG PO TABS
100.0000 mg | ORAL_TABLET | Freq: Every day | ORAL | Status: DC
  Administered 2021-03-30 – 2021-04-02 (×5): 100 mg via ORAL
  Filled 2021-03-30 (×5): qty 1

## 2021-03-30 NOTE — Progress Notes (Signed)
PROGRESS NOTE    Holly Weiss  [id number] DOB: [birth year] DOA: 03/29/2021 PCP: Biagio Borg, MD   Brief Narrative:  Patient is an 80 year old obese African-American female with a past medical history significant for but not limited to dementia, diabetes mellitus type 2 on Lantus, anemia of chronic kidney disease, chronic kidney disease stage IIIb, history of chronic pain due to spinal stenosis, hypertension, hyperlipidemia, decreased mobility as well as on the comorbidities who presented with a chief complaint of lethargy and decreased mental status.  Normally patient is very talkative with family and yesterday morning she did take her morning dose of Lantus but did not eat very much.  The family took her up to bring with her to visit her grandson that is in college and they are all she was in the car she would fall asleep multiple times was not like her.  They got to the destination she would not get out of the car and had difficulty arousing her.  She refused to eat or drink anything and decided to come back home.  After bringing her back to the house the daughter went to the store to do some shopping and when she got home the patient not any better so they brought her to the cell emergency room for further evaluation.  She lives at home with family members rotating staying with her.  She is in a wheelchair all the time and is not able to ambulate on her own and needs assistance getting in and out of the wheelchair from numerous.  Home physical therapy does come to see her according her family.  In the ED she is noted to be lethargic with decreased responsiveness initially.  Her blood glucose was found to be less than 30 and she was given amp of D50.  Her mental status subsequently improved but after initial dose of the D50 her glucose then again dropped and she is started on D10 infusion.  Labs were obtained and showed a mild increase in her BUN/creatinine.  She was admitted for further management of  her hyperglycemia.  She is noted to have a negative head CT but her admission glucose on CMP was 29.   Assessment & Plan:   Principal Problem:   Hypoglycemia Active Problems:   Essential hypertension   CKD (chronic kidney disease), stage III (HCC)   Dementia (HCC)   Dehydration   Encephalopathy   Type 2 diabetes mellitus with hypoglycemia (HCC)  Hypoglycemia in the setting of diabetes mellitus type 2 -She is placed on MedSurg floor for observation and presented with decreased mental status and was found to have a blood sugar 23 -She is given D50 but continue to run low blood sugars in the ER and was placed on D10 infusion -Blood sugar is now improved over 100 and D10 infusion is now stopped -We will continue to hold her home Lantus and she was placed on a very sensitive NovoLog/scale insulin before meals and at bedtime -She was ordered for D5 normal saline at 50 MLS per hour but this has now been stopped and her D10 infusion is now stopped as well -Continue to monitor blood sugars carefully -Likely with her renal dysfunction the insulin that she was taking was too much and since her kidney function is off the insulin was not clearly like it should leading to her persistent hypoglycemia -CBGs now ranging from 119-230 -Repeat hemoglobin A1c is now 7.6 which is significantly down from her previous 11.3  -She will  need at least half of the dose of minimum of Lantus that she was taking and I anticipate her being discharged on 10 units  Essential Hypertension -Continue with Metoprolol Succinate 100 mg po Daily  -Continue to monitor blood pressures per protocol -Last blood pressure reading was  AKI on CKD stage IIIb Dehydration -Patient presented with a BUN/Cr of 40/2.00 -> 43/1.72 -Was on IVF but now Held D10 and D5NS at 50 mL/hr -Hold Lasix  -Avoid nephrotoxic medications, contrast dyes, hypotension and renally dose medications -Continue monitor and trend renal function repeat CMP in  a.m.  Chronic Dementia -C/w Donepezil 2.5 mg p.o. nightly and quetiapine 100 g p.o. nightly  Depression and Anxiety -Continue with Citalopram 10 mg po Daily and Quetiapine 100 mg po qHS  Hyperlipidemia -Continue with Atorvastatin 40 mg po Daily  Acute encephalopathy in the setting of hypoglycemia superimposed on chronic dementia -Patient at baseline has dementia and was more lethargic and had a decreased mental status secondary to hypoglycemia -Mental status is improved but she was agitated today and confused which is likely her baseline  Obesity -Complicates overall prognosis and care -Estimated body mass index is 34.74 kg/m as calculated from the following:   Height as of this encounter: '5\' 1"'$  (1.549 m).   Weight as of this encounter: 83.4 kg. -Weight Loss and Dietary counseling given   DVT prophylaxis: Heparin 5,000 sq q8h Code Status: FULL CODE  Family Communication: Spoke to [employer] via Telephone Disposition Plan: Anticipating D/Cing home in the next 24 hours if Blood Sugars are controlled and not low without the use of D10 or D50  Status is: Observation  The patient will require care spanning > 2 midnights and should be moved to inpatient because: Ongoing diagnostic testing needed not appropriate for outpatient work up, Unsafe d/c plan, IV treatments appropriate due to intensity of illness or inability to take PO, and Inpatient level of care appropriate due to severity of illness  Dispo: The patient is from: Home              Anticipated d/c is to: Home (Family refusing SNF)              Patient currently is not medically stable to d/c.   Difficult to place patient No  Consultants:  None  Procedures:   Antimicrobials:  Anti-infectives (From admission, onward)    Start     Dose/Rate Route Frequency Ordered Stop   03/29/21 2145  cefTRIAXone (ROCEPHIN) 1 g in sodium chloride 0.9 % 100 mL IVPB        1 g 200 mL/hr over 30 Minutes Intravenous  Once 03/29/21  2137 03/29/21 2231   03/29/21 2145  azithromycin (ZITHROMAX) 500 mg in sodium chloride 0.9 % 250 mL IVPB        500 mg 250 mL/hr over 60 Minutes Intravenous  Once 03/29/21 2137 03/29/21 2335        Subjective: Seen and examined at bedside and she was extremely confused and agitated.  Was asking for the "charge nurse [name]" but was talking about her daughter.  She denied any chest pain or shortness breath.  Blood sugars have been improved but we are going to stop the D10 and D5 normal saline that she is getting.  We will need to go to monitor for any hypoglycemia.  No other concerns or complaints at this time and PT OT recommending SNF but after she will go home with home health given prior refusal of SNF  from the family.  Objective: Vitals:   03/29/21 2306 03/30/21 0323 03/30/21 0741 03/30/21 1234  BP: (!) 159/73 (!) 125/48 (!) 125/54 138/61  Pulse: 61 60 66 64  Resp: '16 18 20 17  '$ Temp: 97.6 F (36.4 C) (!) 97.5 F (36.4 C) 98.1 F (36.7 C) 98 F (36.7 C)  TempSrc: Oral Oral Oral Oral  SpO2: 97% 94% 97% 98%  Weight: 83.4 kg     Height: '5\' 1"'$  (1.549 m)       Intake/Output Summary (Last 24 hours) at 03/30/2021 1442 Last data filed at 03/30/2021 1249 Gross per 24 hour  Intake 1144.9 ml  Output 600 ml  Net 544.9 ml   Filed Weights   03/29/21 2306  Weight: 83.4 kg   Examination: Physical Exam:  Constitutional: WN/WD obese African-American female who is agitated and slightly uncomfortable Eyes: Lids and conjunctivae normal, sclerae anicteric  ENMT: External Ears, Nose appear normal. Grossly normal hearing. Neck: Appears normal, supple, no cervical masses, normal ROM, no appreciable thyromegaly; no appreciable JVD Respiratory: Diminished to auscultation bilaterally with coarse breath sounds, no wheezing, rales, rhonchi or crackles. Normal respiratory effort and patient is not tachypenic. No accessory muscle use.  Cardiovascular: RRR, no murmurs / rubs / gallops. S1 and S2  auscultated.  Has some lower mild extremity edema Abdomen: Soft, non-tender, distended secondary body habitus. Bowel sounds positive.  GU: Deferred. Musculoskeletal: No clubbing / cyanosis of digits/nails. No joint deformity upper and lower extremities.  Skin: Has lower extremity skin changes.  No appreciable rashes noted.  No induration; Warm and dry.  Neurologic: CN 2-12 grossly intact with no focal deficits. Romberg sign cerebellar reflexes not assessed.  Psychiatric: Normal judgment and insight.  She is awake but not fully alert and oriented x 3.  Agitated mood and appropriate affect.   Data Reviewed: I have personally reviewed following labs and imaging studies  CBC: Recent Labs  Lab 03/29/21 1807 03/29/21 1844 03/30/21 0600  WBC 9.6  --  8.2  NEUTROABS 5.3  --   --   HGB 9.9* 8.8* 9.7*  HCT 31.4* 26.0* 31.7*  MCV 93.7  --  96.1  PLT 248  --  123456   Basic Metabolic Panel: Recent Labs  Lab 03/29/21 1807 03/29/21 1844 03/30/21 0600  NA 143 145 141  K 3.5 3.6 3.8  CL 107 107 109  CO2 27  --  23  GLUCOSE 29* 117* 241*  BUN 47* 40* 43*  CREATININE 1.98* 2.00* 1.72*  CALCIUM 9.4  --  9.3   GFR: Estimated Creatinine Clearance: 25.5 mL/min (A) (by C-G formula based on SCr of 1.72 mg/dL (H)). Liver Function Tests: Recent Labs  Lab 03/29/21 1807  AST 24  ALT 17  ALKPHOS 65  BILITOT 0.9  PROT 7.5  ALBUMIN 3.1*   Recent Labs  Lab 03/29/21 1807  LIPASE 21   No results for input(s): AMMONIA in the last 168 hours. Coagulation Profile: No results for input(s): INR, PROTIME in the last 168 hours. Cardiac Enzymes: No results for input(s): CKTOTAL, CKMB, CKMBINDEX, TROPONINI in the last 168 hours. BNP (last 3 results) No results for input(s): PROBNP in the last 8760 hours. HbA1C: Recent Labs    03/30/21 0600  HGBA1C 7.6*   CBG: Recent Labs  Lab 03/29/21 2143 03/30/21 0038 03/30/21 0251 03/30/21 0617 03/30/21 0729  GLUCAP 119* 141* 193* 230* 212*   Lipid  Profile: No results for input(s): CHOL, HDL, LDLCALC, TRIG, CHOLHDL, LDLDIRECT in the last 72  hours. Thyroid Function Tests: No results for input(s): TSH, T4TOTAL, FREET4, T3FREE, THYROIDAB in the last 72 hours. Anemia Panel: No results for input(s): VITAMINB12, FOLATE, FERRITIN, TIBC, IRON, RETICCTPCT in the last 72 hours. Sepsis Labs: Recent Labs  Lab 03/29/21 2013  PROCALCITON <0.10    Recent Results (from the past 240 hour(s))  Blood culture (routine x 2)     Status: Abnormal   Collection Time: 03/20/21  9:30 PM   Specimen: BLOOD  Result Value Ref Range Status   Specimen Description   Final    BLOOD RIGHT ANTECUBITAL Performed at [facility] [address], [place], [place] [address]    Special Requests   Final    BOTTLES DRAWN AEROBIC AND ANAEROBIC Blood Culture adequate volume Performed at [facility] [address] [place], [place] [address]    Culture  Setup Time   Final    GRAM POSITIVE COCCI AEROBIC BOTTLE ONLY CRITICAL RESULT CALLED TO, READ BACK BY AND VERIFIED WITH: M MANEY,RN'@0721'$  03/22/21 [place]    Culture (A)  Final    STAPHYLOCOCCUS EPIDERMIDIS THE SIGNIFICANCE OF ISOLATING THIS ORGANISM FROM A SINGLE VENIPUNCTURE CANNOT BE PREDICTED WITHOUT FURTHER CLINICAL AND CULTURE CORRELATION. SUSCEPTIBILITIES AVAILABLE ONLY ON REQUEST. Performed at [facility], [place] [address], [place],  [address]    Report Status 03/24/2021 FINAL  Final  Blood Culture ID Panel (Reflexed)     Status: Abnormal   Collection Time: 03/20/21  9:30 PM  Result Value Ref Range Status   Enterococcus faecalis NOT DETECTED NOT DETECTED Final   Enterococcus Faecium NOT DETECTED NOT DETECTED Final   Listeria monocytogenes NOT DETECTED NOT DETECTED Final   Staphylococcus species DETECTED (A) NOT DETECTED Final    Comment: CRITICAL RESULT CALLED TO, READ BACK BY AND VERIFIED WITH: M MANEY,RN'@0722'$  03/22/21 [place]    Staphylococcus aureus (BCID) NOT  DETECTED NOT DETECTED Final   Staphylococcus epidermidis DETECTED (A) NOT DETECTED Final    Comment: Methicillin (oxacillin) resistant coagulase negative staphylococcus. Possible blood culture contaminant (unless isolated from more than one blood culture draw or clinical case suggests pathogenicity). No antibiotic treatment is indicated for blood  culture contaminants. CRITICAL RESULT CALLED TO, READ BACK BY AND VERIFIED WITH: M MAEY,RN'@0723'$  03/22/21 [place]    Staphylococcus lugdunensis NOT DETECTED NOT DETECTED Final   Streptococcus species NOT DETECTED NOT DETECTED Final   Streptococcus agalactiae NOT DETECTED NOT DETECTED Final   Streptococcus pneumoniae NOT DETECTED NOT DETECTED Final   Streptococcus pyogenes NOT DETECTED NOT DETECTED Final   A.calcoaceticus-baumannii NOT DETECTED NOT DETECTED Final   Bacteroides fragilis NOT DETECTED NOT DETECTED Final   Enterobacterales NOT DETECTED NOT DETECTED Final   Enterobacter cloacae complex NOT DETECTED NOT DETECTED Final   Escherichia coli NOT DETECTED NOT DETECTED Final   Klebsiella aerogenes NOT DETECTED NOT DETECTED Final   Klebsiella oxytoca NOT DETECTED NOT DETECTED Final   Klebsiella pneumoniae NOT DETECTED NOT DETECTED Final   Proteus species NOT DETECTED NOT DETECTED Final   Salmonella species NOT DETECTED NOT DETECTED Final   Serratia marcescens NOT DETECTED NOT DETECTED Final   Haemophilus influenzae NOT DETECTED NOT DETECTED Final   Neisseria meningitidis NOT DETECTED NOT DETECTED Final   Pseudomonas aeruginosa NOT DETECTED NOT DETECTED Final   Stenotrophomonas maltophilia NOT DETECTED NOT DETECTED Final   Candida albicans NOT DETECTED NOT DETECTED Final   Candida auris NOT DETECTED NOT DETECTED Final   Candida glabrata NOT DETECTED NOT DETECTED Final   Candida krusei  NOT DETECTED NOT DETECTED Final   Candida parapsilosis NOT DETECTED NOT DETECTED Final   Candida tropicalis NOT DETECTED NOT DETECTED Final   Cryptococcus  neoformans/gattii NOT DETECTED NOT DETECTED Final   Methicillin resistance mecA/C DETECTED (A) NOT DETECTED Final    Comment: CRITICAL RESULT CALLED TO, READ BACK BY AND VERIFIED WITH: M AMEY,RN'@0723'$  03/22/21 [place] Performed at [facility], [address], [place], [place] [address]   Resp Panel by RT-PCR (Flu A&B, Covid) Nasopharyngeal Swab     Status: None   Collection Time: 03/29/21  6:35 PM   Specimen: Nasopharyngeal Swab; Nasopharyngeal(NP) swabs in vial transport medium  Result Value Ref Range Status   SARS Coronavirus 2 by RT PCR NEGATIVE NEGATIVE Final    Comment: (NOTE) SARS-CoV-2 target nucleic acids are NOT DETECTED.  The SARS-CoV-2 RNA is generally detectable in upper respiratory specimens during the acute phase of infection. The lowest concentration of SARS-CoV-2 viral copies this assay can detect is 138 copies/mL. A negative result does not preclude SARS-Cov-2 infection and should not be used as the sole basis for treatment or other patient management decisions. A negative result may occur with  improper specimen collection/handling, submission of specimen other than nasopharyngeal swab, presence of viral mutation(s) within the areas targeted by this assay, and inadequate number of viral copies(<138 copies/mL). A negative result must be combined with clinical observations, patient history, and epidemiological information. The expected result is Negative.  Fact Sheet for Patients:  EntrepreneurPulse.com.au  Fact Sheet for Healthcare Providers:  IncredibleEmployment.be  This test is no t yet approved or cleared by the [place] FDA and  has been authorized for detection and/or diagnosis of SARS-CoV-2 by FDA under an Emergency Use Authorization (EUA). This EUA will remain  in effect (meaning this test can be used) for the duration of the COVID-19 declaration under Section 564(b)(1) of the Act, 21 U.S.C.section 360bbb-3(b)(1),  unless the authorization is terminated  or revoked sooner.       Influenza A by PCR NEGATIVE NEGATIVE Final   Influenza B by PCR NEGATIVE NEGATIVE Final    Comment: (NOTE) The Xpert Xpress SARS-CoV-2/FLU/RSV plus assay is intended as an aid in the diagnosis of influenza from Nasopharyngeal swab specimens and should not be used as a sole basis for treatment. Nasal washings and aspirates are unacceptable for Xpert Xpress SARS-CoV-2/FLU/RSV testing.  Fact Sheet for Patients: EntrepreneurPulse.com.au  Fact Sheet for Healthcare Providers: IncredibleEmployment.be  This test is not yet approved or cleared by the [place] FDA and has been authorized for detection and/or diagnosis of SARS-CoV-2 by FDA under an Emergency Use Authorization (EUA). This EUA will remain in effect (meaning this test can be used) for the duration of the COVID-19 declaration under Section 564(b)(1) of the Act, 21 U.S.C. section 360bbb-3(b)(1), unless the authorization is terminated or revoked.  Performed at [facility], [place] [address], [place], [place] [address]     RN Pressure Injury Documentation:     Estimated body mass index is 34.74 kg/m as calculated from the following:   Height as of this encounter: '5\' 1"'$  (1.549 m).   Weight as of this encounter: 83.4 kg.  Malnutrition Type:   Malnutrition Characteristics:   Nutrition Interventions:    Radiology Studies: CT Head Wo Contrast  Result Date: 03/29/2021 CLINICAL DATA:  Altered mental status. EXAM: CT HEAD WITHOUT CONTRAST TECHNIQUE: Contiguous axial images were obtained from the base of the skull through the vertex without intravenous contrast. COMPARISON:  September 27, 2019 FINDINGS: Brain: There is mild cerebral atrophy with widening of the extra-axial spaces and ventricular dilatation. There are areas of decreased attenuation within the white matter tracts of the supratentorial brain,  consistent with microvascular disease changes. Vascular: No hyperdense vessel or unexpected calcification. Skull: Normal. Negative for fracture or focal lesion. Sinuses/Orbits: Chronic diffuse calvarial thickening is seen with stable appearing mixed lytic and sclerotic areas. Other: None. IMPRESSION: 1. Generalized cerebral atrophy. 2. No acute intracranial abnormality. Electronically Signed   By: Virgina Norfolk M.D.   On: 03/29/2021 19:06   CT CHEST WO CONTRAST  Result Date: 03/29/2021 CLINICAL DATA:  Concern for infiltrate. EXAM: CT CHEST WITHOUT CONTRAST TECHNIQUE: Multidetector CT imaging of the chest was performed following the standard protocol without IV contrast. COMPARISON:  Chest radiograph dated 03/29/2021. FINDINGS: Evaluation of this exam is limited in the absence of intravenous contrast. Cardiovascular: Mild cardiomegaly. No pericardial effusion. There is a three-vessel coronary vascular calcification. Mild atherosclerotic calcification of the thoracic aorta. No aneurysmal dilatation. The central pulmonary arteries are grossly unremarkable. Mediastinum/Nodes: No hilar or mediastinal adenopathy. Evaluation however is limited in the absence of intravenous contrast. The esophagus and the thyroid gland are grossly unremarkable. No mediastinal fluid collection. Lungs/Pleura: Bilateral lower lobe linear atelectasis/scarring. Focal consolidation, pleural effusion, or pneumothorax. The central airways are patent. Upper Abdomen: Cholecystectomy. Musculoskeletal: Median sternotomy wires. Degenerative changes of the spine. No acute osseous pathology. IMPRESSION: 1. No acute intrathoracic pathology. 2. Mild cardiomegaly with three-vessel coronary vascular calcification. 3. Aortic Atherosclerosis (ICD10-I70.0). Electronically Signed   By: Anner Crete M.D.   On: 03/29/2021 22:39   DG Chest Portable 1 View  Result Date: 03/29/2021 CLINICAL DATA:  Hypoglycemia. EXAM: PORTABLE CHEST 1 VIEW COMPARISON:   September 27, 2019 FINDINGS: Multiple sternal wires are seen. Low lung volumes are noted. Mild areas of atelectasis and/or infiltrate are seen along the infrahilar regions, bilaterally. There is no evidence of a pleural effusion or pneumothorax. The heart size and mediastinal contours are within normal limits. Multilevel degenerative changes seen throughout the thoracic spine. IMPRESSION: 1. Evidence of prior median sternotomy. 2. Mild bilateral infrahilar atelectasis and/or infiltrate. Electronically Signed   By: Virgina Norfolk M.D.   On: 03/29/2021 18:44    Scheduled Meds:  aspirin  81 mg Oral Daily   atorvastatin  40 mg Oral Daily   citalopram  10 mg Oral Daily   donepezil  2.5 mg Oral QHS   furosemide  40 mg Oral Daily   heparin  5,000 Units Subcutaneous Q8H   insulin aspart  0-5 Units Subcutaneous QHS   insulin aspart  0-6 Units Subcutaneous TID WC   metoprolol succinate  100 mg Oral Daily   QUEtiapine  100 mg Oral QHS   Continuous Infusions:   LOS: 0 days   Kerney Elbe, DO [facility] PAGER is on AMION  If 7PM-7AM, please contact night-coverage [email or web address]

## 2021-03-30 NOTE — Patient Instructions (Signed)
Pt not seen.

## 2021-03-30 NOTE — Progress Notes (Signed)
Inpatient Diabetes Program Recommendations  AACE/ADA: New Consensus Statement on Inpatient Glycemic Control (2015)  Target Ranges:  Prepandial:   less than 140 mg/dL      Peak postprandial:   less than 180 mg/dL (1-2 hours)      Critically ill patients:  140 - 180 mg/dL   Lab Results  Component Value Date   GLUCAP 212 (H) 03/30/2021   HGBA1C 11.3 (H) 11/01/2020    Review of Glycemic Control Results for Holly Weiss, Holly Weiss (MRN [id number]) as of 03/30/2021 09:05  Ref. Range 03/30/2021 00:38 03/30/2021 02:51 03/30/2021 06:17 03/30/2021 07:29  Glucose-Capillary Latest Ref Range: 70 - 99 mg/dL 141 (H) 193 (H) 230 (H) 212 (H)   Diabetes history: DM 2 Outpatient Diabetes medications: Lantus 30 units Daily Current orders for Inpatient glycemic control:  None/hypoglycemia on admission  BUN/Creat: 43/1.72  Pt most likely holding onto insulin longer with her renal function elevated. Titration needed  Inpatient Diabetes Program Recommendations:    -  order Novolog 0-6 units tid + hs  Outpatient: Needs basal insulin at least reduced by half and glucose checks at least twice a day (fasting and alternating second check) for awhile to titrate her insulin.   Thanks,  Tama Headings RN, MSN, BC-ADM Inpatient Diabetes Coordinator Team Pager [phone] (8a-5p)

## 2021-03-30 NOTE — Evaluation (Signed)
Physical Therapy Evaluation Patient Details Name: Holly Weiss MRN: [id number] DOB: [birth year] Today's Date: 03/30/2021   History of Present Illness  Pt is an 80 y.o. female admitted 8/27 with dx of hypoglycemia. She presented to the ED for evaluation of lethargy and AMS. 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   Clinical Impression  Pt admitted with above diagnosis. PTA pt lived at home with family. Pt reports she does not walk and uses a w/c for mobility. She is a poor historian so unsure of validity of information provided. No family present.  On eval, pt pleasant on arrival. Oriented to self only. Agitation escalated quickly with pt yelling and resisting all mobility attempts. Pt will benefit from skilled PT to further assess mobility and increase their independence and safety with mobility to allow discharge to the venue listed below.  Based on mobility observed, recommend SNF. However, it is documented in H&P that family is declining SNF. If this is the case, recommend HHPT if family agreeable.     Follow Up Recommendations SNF (Per H&P family declining SNF. Recommend HHPT if accurate and family agreeable.)    Equipment Recommendations  None recommended by PT (Per chart, pt has all needed DME.)    Recommendations for Other Services       Precautions / Restrictions Precautions Precautions: Fall;Other (comment) Precaution Comments: agitated with mobility attempts      Mobility  Bed Mobility Overal bed mobility: Needs Assistance Bed Mobility: Rolling;Supine to Sit Rolling: Max assist   Supine to sit: Max assist     General bed mobility comments: max assist to initiate. Pt resisting all mobility attempts. Agitation escalated quickly. Unable to successfully transition to EOB    Transfers                 General transfer comment: unable due to agitation  Ambulation/Gait                 Stairs            Wheelchair Mobility    Modified Rankin (Stroke Patients Only)       Balance                                             Pertinent Vitals/Pain Pain Assessment: Faces Faces Pain Scale: Hurts even more Pain Location: BLE with mobility attempts and palpation Pain Descriptors / Indicators: Moaning;Grimacing Pain Intervention(s): Limited activity within patient's tolerance;Repositioned    Home Living Family/patient expects to be discharged to:: Private residence Living Arrangements: Children Available Help at Discharge: Family Type of Home: House Home Access: Level entry     Home Layout: Two level;Able to live on main level with bedroom/bathroom Home Equipment: Gilford Rile - 2 wheels;Walker - 4 wheels;Grab bars - toilet;Grab bars - tub/shower;Wheelchair - manual Additional Comments: Home set up taken from previous admission (09/2019)    Prior Function Level of Independence: Needs assistance   Gait / Transfers Assistance Needed: Pt reports w/c for mobility. Does state she is able to transfer to/from w/c independently.  ADL's / Homemaking Assistance Needed: family assists with ADLs  Comments: No family present. Unsure of validity of mobility information provided as pt is a poor historian.     Hand Dominance        Extremity/Trunk Assessment  Upper Extremity Assessment Upper Extremity Assessment: Difficult to assess due to impaired cognition    Lower Extremity Assessment Lower Extremity Assessment: Difficult to assess due to impaired cognition       Communication      Cognition Arousal/Alertness: Awake/alert Behavior During Therapy: Agitated Overall Cognitive Status: No family/caregiver present to determine baseline cognitive functioning                                 General Comments: Oriented to self only. Agitation escalated quickly with mobility attempts.Pt yelling for PT to stop. Unable to redirect.       General Comments      Exercises     Assessment/Plan    PT Assessment Patient needs continued PT services  PT Problem List Decreased mobility;Pain;Decreased activity tolerance;Decreased cognition       PT Treatment Interventions DME instruction;Therapeutic activities;Cognitive remediation;Therapeutic exercise;Patient/family education;Gait training;Balance training;Functional mobility training    PT Goals (Current goals can be found in the Care Plan section)  Acute Rehab PT Goals Patient Stated Goal: unable to state PT Goal Formulation: Patient unable to participate in goal setting Time For Goal Achievement: 04/13/21 Potential to Achieve Goals: Fair    Frequency Min 3X/week   Barriers to discharge        Co-evaluation               AM-PAC PT "6 Clicks" Mobility  Outcome Measure Help needed turning from your back to your side while in a flat bed without using bedrails?: A Lot Help needed moving from lying on your back to sitting on the side of a flat bed without using bedrails?: Total Help needed moving to and from a bed to a chair (including a wheelchair)?: Total Help needed standing up from a chair using your arms (e.g., wheelchair or bedside chair)?: Total Help needed to walk in hospital room?: Total Help needed climbing 3-5 steps with a railing? : Total 6 Click Score: 7    End of Session   Activity Tolerance: Treatment limited secondary to agitation Patient left: in bed;with call bell/phone within reach;with bed alarm set Nurse Communication: Mobility status PT Visit Diagnosis: Other abnormalities of gait and mobility (R26.89)    Time: [id number] PT Time Calculation (min) (ACUTE ONLY): 10 min   Charges:   PT Evaluation $PT Eval Moderate Complexity: 1 Mod          Lorrin Goodell, PT  Office # [phone] Pager [phone]   Lorriane Shire 03/30/2021, 10:44 AM

## 2021-03-31 ENCOUNTER — Telehealth: Payer: Medicare Other | Admitting: Internal Medicine

## 2021-03-31 DIAGNOSIS — Z79899 Other long term (current) drug therapy: Secondary | ICD-10-CM | POA: Diagnosis not present

## 2021-03-31 DIAGNOSIS — I129 Hypertensive chronic kidney disease with stage 1 through stage 4 chronic kidney disease, or unspecified chronic kidney disease: Secondary | ICD-10-CM | POA: Diagnosis present

## 2021-03-31 DIAGNOSIS — Z96651 Presence of right artificial knee joint: Secondary | ICD-10-CM | POA: Diagnosis present

## 2021-03-31 DIAGNOSIS — E871 Hypo-osmolality and hyponatremia: Secondary | ICD-10-CM | POA: Diagnosis present

## 2021-03-31 DIAGNOSIS — F0390 Unspecified dementia without behavioral disturbance: Secondary | ICD-10-CM | POA: Diagnosis present

## 2021-03-31 DIAGNOSIS — R7989 Other specified abnormal findings of blood chemistry: Secondary | ICD-10-CM | POA: Diagnosis not present

## 2021-03-31 DIAGNOSIS — N179 Acute kidney failure, unspecified: Secondary | ICD-10-CM | POA: Diagnosis present

## 2021-03-31 DIAGNOSIS — G894 Chronic pain syndrome: Secondary | ICD-10-CM | POA: Diagnosis present

## 2021-03-31 DIAGNOSIS — Z20822 Contact with and (suspected) exposure to covid-19: Secondary | ICD-10-CM | POA: Diagnosis present

## 2021-03-31 DIAGNOSIS — F32A Depression, unspecified: Secondary | ICD-10-CM | POA: Diagnosis present

## 2021-03-31 DIAGNOSIS — G9341 Metabolic encephalopathy: Secondary | ICD-10-CM | POA: Diagnosis present

## 2021-03-31 DIAGNOSIS — N1832 Chronic kidney disease, stage 3b: Secondary | ICD-10-CM | POA: Diagnosis present

## 2021-03-31 DIAGNOSIS — E785 Hyperlipidemia, unspecified: Secondary | ICD-10-CM | POA: Diagnosis present

## 2021-03-31 DIAGNOSIS — Z833 Family history of diabetes mellitus: Secondary | ICD-10-CM | POA: Diagnosis not present

## 2021-03-31 DIAGNOSIS — D631 Anemia in chronic kidney disease: Secondary | ICD-10-CM | POA: Diagnosis present

## 2021-03-31 DIAGNOSIS — G301 Alzheimer's disease with late onset: Secondary | ICD-10-CM | POA: Diagnosis not present

## 2021-03-31 DIAGNOSIS — E11649 Type 2 diabetes mellitus with hypoglycemia without coma: Secondary | ICD-10-CM | POA: Diagnosis present

## 2021-03-31 DIAGNOSIS — E11319 Type 2 diabetes mellitus with unspecified diabetic retinopathy without macular edema: Secondary | ICD-10-CM | POA: Diagnosis present

## 2021-03-31 DIAGNOSIS — N1831 Chronic kidney disease, stage 3a: Secondary | ICD-10-CM | POA: Diagnosis not present

## 2021-03-31 DIAGNOSIS — Z7982 Long term (current) use of aspirin: Secondary | ICD-10-CM | POA: Diagnosis not present

## 2021-03-31 DIAGNOSIS — E669 Obesity, unspecified: Secondary | ICD-10-CM | POA: Diagnosis present

## 2021-03-31 DIAGNOSIS — E162 Hypoglycemia, unspecified: Secondary | ICD-10-CM | POA: Diagnosis present

## 2021-03-31 DIAGNOSIS — Z8249 Family history of ischemic heart disease and other diseases of the circulatory system: Secondary | ICD-10-CM | POA: Diagnosis not present

## 2021-03-31 DIAGNOSIS — Z801 Family history of malignant neoplasm of trachea, bronchus and lung: Secondary | ICD-10-CM | POA: Diagnosis not present

## 2021-03-31 DIAGNOSIS — E1122 Type 2 diabetes mellitus with diabetic chronic kidney disease: Secondary | ICD-10-CM | POA: Diagnosis present

## 2021-03-31 DIAGNOSIS — E860 Dehydration: Secondary | ICD-10-CM | POA: Diagnosis present

## 2021-03-31 DIAGNOSIS — Z6834 Body mass index (BMI) 34.0-34.9, adult: Secondary | ICD-10-CM | POA: Diagnosis not present

## 2021-03-31 LAB — COMPREHENSIVE METABOLIC PANEL
ALT: 16 U/L (ref 0–44)
AST: 18 U/L (ref 15–41)
Albumin: 2.7 g/dL — ABNORMAL LOW (ref 3.5–5.0)
Alkaline Phosphatase: 55 U/L (ref 38–126)
Anion gap: 5 (ref 5–15)
BUN: 39 mg/dL — ABNORMAL HIGH (ref 8–23)
CO2: 30 mmol/L (ref 22–32)
Calcium: 9.6 mg/dL (ref 8.9–10.3)
Chloride: 111 mmol/L (ref 98–111)
Creatinine, Ser: 1.4 mg/dL — ABNORMAL HIGH (ref 0.44–1.00)
GFR, Estimated: 38 mL/min — ABNORMAL LOW (ref >60)
Glucose, Bld: 75 mg/dL (ref 70–99)
Potassium: 3.5 mmol/L (ref 3.5–5.1)
Sodium: 146 mmol/L — ABNORMAL HIGH (ref 135–145)
Total Bilirubin: 0.6 mg/dL (ref 0.3–1.2)
Total Protein: 6.7 g/dL (ref 6.5–8.1)

## 2021-03-31 LAB — CBC WITH DIFFERENTIAL/PLATELET
Abs Immature Granulocytes: 0.02 10*3/uL (ref 0.00–0.07)
Basophils Absolute: 0.1 10*3/uL (ref 0.0–0.1)
Basophils Relative: 1 %
Eosinophils Absolute: 1.1 10*3/uL — ABNORMAL HIGH (ref 0.0–0.5)
Eosinophils Relative: 15 %
HCT: 28.1 % — ABNORMAL LOW (ref 36.0–46.0)
Hemoglobin: 8.9 g/dL — ABNORMAL LOW (ref 12.0–15.0)
Immature Granulocytes: 0 %
Lymphocytes Relative: 32 %
Lymphs Abs: 2.3 10*3/uL (ref 0.7–4.0)
MCH: 29.5 pg (ref 26.0–34.0)
MCHC: 31.7 g/dL (ref 30.0–36.0)
MCV: 93 fL (ref 80.0–100.0)
Monocytes Absolute: 0.9 10*3/uL (ref 0.1–1.0)
Monocytes Relative: 13 %
Neutro Abs: 2.8 10*3/uL (ref 1.7–7.7)
Neutrophils Relative %: 39 %
Platelets: 207 10*3/uL (ref 150–400)
RBC: 3.02 MIL/uL — ABNORMAL LOW (ref 3.87–5.11)
RDW: 16 % — ABNORMAL HIGH (ref 11.5–15.5)
WBC: 7.1 10*3/uL (ref 4.0–10.5)
nRBC: 0 % (ref 0.0–0.2)

## 2021-03-31 LAB — GLUCOSE, CAPILLARY
Glucose-Capillary: 154 mg/dL — ABNORMAL HIGH (ref 70–99)
Glucose-Capillary: 61 mg/dL — ABNORMAL LOW (ref 70–99)
Glucose-Capillary: 65 mg/dL — ABNORMAL LOW (ref 70–99)
Glucose-Capillary: 186 mg/dL — ABNORMAL HIGH (ref 70–99)
Glucose-Capillary: 180 mg/dL — ABNORMAL HIGH (ref 70–99)
Glucose-Capillary: 169 mg/dL — ABNORMAL HIGH (ref 70–99)

## 2021-03-31 LAB — MAGNESIUM: Magnesium: 1.6 mg/dL — ABNORMAL LOW (ref 1.7–2.4)

## 2021-03-31 LAB — PHOSPHORUS: Phosphorus: 2.7 mg/dL (ref 2.5–4.6)

## 2021-03-31 MED ORDER — DEXTROSE 50 % IV SOLN
INTRAVENOUS | Status: AC
  Administered 2021-03-31: 50 mL
  Filled 2021-03-31: qty 50

## 2021-03-31 MED ORDER — COLLAGENASE 250 UNIT/GM EX OINT
TOPICAL_OINTMENT | Freq: Every day | CUTANEOUS | Status: DC
  Administered 2021-04-02: 1 via TOPICAL
  Filled 2021-03-31: qty 30

## 2021-03-31 MED ORDER — POTASSIUM CHLORIDE CRYS ER 20 MEQ PO TBCR
40.0000 meq | EXTENDED_RELEASE_TABLET | Freq: Once | ORAL | Status: AC
  Administered 2021-03-31: 40 meq via ORAL
  Filled 2021-03-31: qty 2

## 2021-03-31 MED ORDER — MAGNESIUM SULFATE 2 GM/50ML IV SOLN
2.0000 g | Freq: Once | INTRAVENOUS | Status: AC
  Administered 2021-03-31: 2 g via INTRAVENOUS
  Filled 2021-03-31: qty 50

## 2021-03-31 NOTE — Consult Note (Signed)
WOC Nurse Consult Note: The primary RN put in a consult for MASD to the abdominal fold and moisture issues to the sacrum after I had already completed my first evaluation.  For the sacrum, use of a size appropriate foam dressing, change every 3 days and prn.  For the abdominal fold,  Measure and cut length of InterDry to fit in skin folds that have skin breakdown Tuck InterDry fabric into skin folds in a single layer, allow for 2 inches of overhang from skin edges to allow for wicking to occur May remove to bathe; dry area thoroughly and then tuck into affected areas again Do not apply any creams or ointments when using InterDry DO NOT THROW AWAY FOR 5 DAYS unless soiled with stool DO NOT [facility] product, this will inactivate the silver in the material  New sheet of Interdry should be applied after 5 days of use if patient continues to have skin breakdown    Discussed plan of care with the bedside nurse.  [facility] nurse will not follow at this time.  Please re-consult the [place] team if needed.  Val Riles, RN, MSN, CWOCN, CNS-BC, pager [phone]

## 2021-03-31 NOTE — Progress Notes (Signed)
PROGRESS NOTE    Holly Weiss  [id number] DOB: January 23, 1941 DOA: 03/29/2021 PCP: Biagio Borg, MD   Brief Narrative:  Patient is an 80 year old obese African-American female with a past medical history significant for but not limited to dementia, diabetes mellitus type 2 on Lantus, anemia of chronic kidney disease, chronic kidney disease stage IIIb, history of chronic pain due to spinal stenosis, hypertension, hyperlipidemia, decreased mobility as well as on the comorbidities who presented with a chief complaint of lethargy and decreased mental status.  Normally patient is very talkative with family and yesterday morning she did take her morning dose of Lantus but did not eat very much.  The family took her up to bring with her to visit her grandson that is in college and they are all she was in the car she would fall asleep multiple times was not like her.  They got to the destination she would not get out of the car and had difficulty arousing her.  She refused to eat or drink anything and decided to come back home.  After bringing her back to the house the daughter went to the store to do some shopping and when she got home the patient not any better so they brought her to the cell emergency room for further evaluation.  She lives at home with family members rotating staying with her.  She is in a wheelchair all the time and is not able to ambulate on her own and needs assistance getting in and out of the wheelchair from numerous.  Home physical therapy does come to see her according her family.  In the ED she is noted to be lethargic with decreased responsiveness initially.  Her blood glucose was found to be less than 30 and she was given amp of D50.  Her mental status subsequently improved but after initial dose of the D50 her glucose then again dropped and she is started on D10 infusion.  Labs were obtained and showed a mild increase in her BUN/creatinine.  She was admitted for further management of  her hyperglycemia.  She is noted to have a negative head CT but her admission glucose on CMP was 29.  Her blood sugars have improved significantly since admission after her insulin has been held however she was a little hypoglycemic this morning in the 60s.  She only received very minimal sliding scale yesterday.  We will need to continue to hold her long-acting insuli.  PT OT evaluated and recommending SNF and patient's family is in agreement with this now.  TOC to assist with placement  Assessment & Plan:   Principal Problem:   Hypoglycemia Active Problems:   Essential hypertension   CKD (chronic kidney disease), stage III (HCC)   Dementia (HCC)   Dehydration   Encephalopathy   Type 2 diabetes mellitus with hypoglycemia (HCC)  Hypoglycemia in the setting of Diabetes Mellitus Type 2 -She is placed on MedSurg floor for observation and presented with decreased mental status and was found to have a blood sugar 23 -She is given D50 but continue to run low blood sugars in the ER and was placed on D10 infusion -Blood sugar is now improved over 100 and D10 infusion is now stopped -We will continue to hold her home Lantus and she was placed on a very sensitive NovoLog/scale insulin before meals and at bedtime -She was ordered for D5 normal saline at 50 MLS per hour but this has now been stopped and her D10 infusion  is now stopped as well -Continue to monitor blood sugars carefully -Likely with her renal dysfunction the insulin that she was taking was too much and since her kidney function is off the insulin was not clearly like it should leading to her persistent hypoglycemia -CBGs now ranging from 61-186 -Repeat hemoglobin A1c is now 7.6 which is significantly down from her previous 11.3  -She will need at least half of the dose of minimum of Lantus that she was taking and I anticipate her being discharged on 10 units  Essential Hypertension -Continue with Metoprolol Succinate 100 mg po Daily   -Continue to monitor blood pressures per protocol -Last blood pressure reading was 168/62  Hyponatremia -Patient's Na+ was 141 and went to 146 -Continue to Monitor and Trend Na+ and repeat CMP in the AM   AKI on CKD stage IIIb Dehydration -Patient presented with a BUN/Cr of 40/2.00 -> 43/1.72 -> 40/1.40 -IVF now stopped  -Hold Lasix  -Avoid nephrotoxic medications, contrast dyes, hypotension and renally dose medications -Continue monitor and trend renal function repeat CMP in a.m.  Chronic Dementia -C/w Donepezil 2.5 mg p.o. nightly and quetiapine 100 g p.o. nightly  Depression and Anxiety -Continue with Citalopram 10 mg po Daily and Quetiapine 100 mg po qHS  Hyperlipidemia -Continue with Atorvastatin 40 mg po Daily  Lower extremity wounds, present on admission -WOC nurse consulted for further evaluation recommendations  Acute encephalopathy in the setting of hypoglycemia superimposed on chronic dementia -Patient at baseline has dementia and was more lethargic and had a decreased mental status secondary to hypoglycemia -Mental status is improved but she was agitated today and confused which is likely her baseline  Obesity -Complicates overall prognosis and care -Estimated body mass index is 34.74 kg/m as calculated from the following:   Height as of this encounter: '5\' 1"'$  (1.549 m).   Weight as of this encounter: 83.4 kg. -Weight Loss and Dietary counseling given   DVT prophylaxis: Heparin 5,000 sq q8h Code Status: FULL CODE  Family Communication: Spoke to [employer] via Telephone again  Disposition Plan: Anticipating D/Cing home in the next 24 hours if Blood Sugars are controlled and not low without the use of D10 or D50  Status is: Observation  The patient will require care spanning > 2 midnights and should be moved to inpatient because: Ongoing diagnostic testing needed not appropriate for outpatient work up, Unsafe d/c plan, IV treatments appropriate due to  intensity of illness or inability to take PO, and Inpatient level of care appropriate due to severity of illness  Dispo: The patient is from: Home              Anticipated d/c is to: Home (Family refusing SNF)              Patient currently is not medically stable to d/c.   Difficult to place patient No  Consultants:  None  Procedures: None   Antimicrobials:  Anti-infectives (From admission, onward)    Start     Dose/Rate Route Frequency Ordered Stop   03/29/21 2145  cefTRIAXone (ROCEPHIN) 1 g in sodium chloride 0.9 % 100 mL IVPB        1 g 200 mL/hr over 30 Minutes Intravenous  Once 03/29/21 2137 03/29/21 2231   03/29/21 2145  azithromycin (ZITHROMAX) 500 mg in sodium chloride 0.9 % 250 mL IVPB        500 mg 250 mL/hr over 60 Minutes Intravenous  Once 03/29/21 2137 03/29/21 2335  Subjective: Seen and examined at bedside and she was still little confused but this is her baseline and she is coronary.  Blood sugar was a little lower this morning but recovered.  She denies any complaints at this time and feels well.  PT OT recommending SNF and patient's family is agreeable.  Have asked TOC to assist with placement.  No other concerns or complaints this time.  Objective: Vitals:   03/30/21 1629 03/30/21 2002 03/31/21 0429 03/31/21 1430  BP: (!) 131/57 (!) 153/64 (!) 118/54 (!) 168/62  Pulse: 63 68 63 69  Resp: '17 18 18 20  '$ Temp: 98.2 F (36.8 C) 98.1 F (36.7 C) 98.1 F (36.7 C) 98.4 F (36.9 C)  TempSrc: Oral Oral Oral Oral  SpO2: 98% 95% 94% 99%  Weight:      Height:        Intake/Output Summary (Last 24 hours) at 03/31/2021 1513 Last data filed at 03/31/2021 1500 Gross per 24 hour  Intake 600 ml  Output 800 ml  Net -200 ml    Filed Weights   03/29/21 2306  Weight: 83.4 kg   Examination: Physical Exam:  Constitutional: WN/WD obese AAF who is pleasantly demented and in NAD appears comfortable Eyes: Lids and conjunctivae normal, sclerae anicteric  ENMT:  External Ears, Nose appear normal. Grossly normal hearing. Neck: Appears normal, supple, no cervical masses, normal ROM, no appreciable thyromegaly; no JVD Respiratory: Diminished to auscultation bilaterally, no wheezing, rales, rhonchi or crackles. Normal respiratory effort and patient is not tachypenic. No accessory muscle use. Unlabored breathing  Cardiovascular: RRR, no murmurs / rubs / gallops. S1 and S2 auscultated. Has mild LE edema  Abdomen: Soft, non-tender, Distended 2/2 to body habitus. Bowel sounds positive.  GU: Deferred. Musculoskeletal: No clubbing / cyanosis of digits/nails. No joint deformity upper and lower extremities.  Skin: Has LE wounds. No induration appreciated  Neurologic: CN 2-12 grossly intact with no focal deficits. Romberg sign and cerebellar reflexes not assessed.  Psychiatric: Impaired judgment and insight. She is awake but not Alert and oriented x 3. Normal mood and appropriate affect.   Data Reviewed: I have personally reviewed following labs and imaging studies  CBC: Recent Labs  Lab 03/29/21 1807 03/29/21 1844 03/30/21 0600 03/31/21 0527  WBC 9.6  --  8.2 7.1  NEUTROABS 5.3  --   --  2.8  HGB 9.9* 8.8* 9.7* 8.9*  HCT 31.4* 26.0* 31.7* 28.1*  MCV 93.7  --  96.1 93.0  PLT 248  --  211 [id number]    Basic Metabolic Panel: Recent Labs  Lab 03/29/21 1807 03/29/21 1844 03/30/21 0600 03/31/21 0527  NA 143 145 141 146*  K 3.5 3.6 3.8 3.5  CL 107 107 109 111  CO2 27  --  23 30  GLUCOSE 29* 117* 241* 75  BUN 47* 40* 43* 39*  CREATININE 1.98* 2.00* 1.72* 1.40*  CALCIUM 9.4  --  9.3 9.6  MG  --   --   --  1.6*  PHOS  --   --   --  2.7    GFR: Estimated Creatinine Clearance: 31.4 mL/min (A) (by C-G formula based on SCr of 1.4 mg/dL (H)). Liver Function Tests: Recent Labs  Lab 03/29/21 1807 03/31/21 0527  AST 24 18  ALT 17 16  ALKPHOS 65 55  BILITOT 0.9 0.6  PROT 7.5 6.7  ALBUMIN 3.1* 2.7*    Recent Labs  Lab 03/29/21 1807  LIPASE 21     No results  for input(s): AMMONIA in the last 168 hours. Coagulation Profile: No results for input(s): INR, PROTIME in the last 168 hours. Cardiac Enzymes: No results for input(s): CKTOTAL, CKMB, CKMBINDEX, TROPONINI in the last 168 hours. BNP (last 3 results) No results for input(s): PROBNP in the last 8760 hours. HbA1C: Recent Labs    03/30/21 0600  HGBA1C 7.6*    CBG: Recent Labs  Lab 03/30/21 2117 03/31/21 0747 03/31/21 0801 03/31/21 0818 03/31/21 1207  GLUCAP 132* 65* 61* 154* 186*    Lipid Profile: No results for input(s): CHOL, HDL, LDLCALC, TRIG, CHOLHDL, LDLDIRECT in the last 72 hours. Thyroid Function Tests: No results for input(s): TSH, T4TOTAL, FREET4, T3FREE, THYROIDAB in the last 72 hours. Anemia Panel: No results for input(s): VITAMINB12, FOLATE, FERRITIN, TIBC, IRON, RETICCTPCT in the last 72 hours. Sepsis Labs: Recent Labs  Lab 03/29/21 2013  PROCALCITON <0.10    Recent Results (from the past 240 hour(s))  Resp Panel by RT-PCR (Flu A&B, Covid) Nasopharyngeal Swab     Status: None   Collection Time: 03/29/21  6:35 PM   Specimen: Nasopharyngeal Swab; Nasopharyngeal(NP) swabs in vial transport medium  Result Value Ref Range Status   SARS Coronavirus 2 by RT PCR NEGATIVE NEGATIVE Final    Comment: (NOTE) SARS-CoV-2 target nucleic acids are NOT DETECTED.  The SARS-CoV-2 RNA is generally detectable in upper respiratory specimens during the acute phase of infection. The lowest concentration of SARS-CoV-2 viral copies this assay can detect is 138 copies/mL. A negative result does not preclude SARS-Cov-2 infection and should not be used as the sole basis for treatment or other patient management decisions. A negative result may occur with  improper specimen collection/handling, submission of specimen other than nasopharyngeal swab, presence of viral mutation(s) within the areas targeted by this assay, and inadequate number of viral copies(<138  copies/mL). A negative result must be combined with clinical observations, patient history, and epidemiological information. The expected result is Negative.  Fact Sheet for Patients:  EntrepreneurPulse.com.au  Fact Sheet for Healthcare Providers:  IncredibleEmployment.be  This test is no t yet approved or cleared by the [place] FDA and  has been authorized for detection and/or diagnosis of SARS-CoV-2 by FDA under an Emergency Use Authorization (EUA). This EUA will remain  in effect (meaning this test can be used) for the duration of the COVID-19 declaration under Section 564(b)(1) of the Act, 21 U.S.C.section 360bbb-3(b)(1), unless the authorization is terminated  or revoked sooner.       Influenza A by PCR NEGATIVE NEGATIVE Final   Influenza B by PCR NEGATIVE NEGATIVE Final    Comment: (NOTE) The Xpert Xpress SARS-CoV-2/FLU/RSV plus assay is intended as an aid in the diagnosis of influenza from Nasopharyngeal swab specimens and should not be used as a sole basis for treatment. Nasal washings and aspirates are unacceptable for Xpert Xpress SARS-CoV-2/FLU/RSV testing.  Fact Sheet for Patients: EntrepreneurPulse.com.au  Fact Sheet for Healthcare Providers: IncredibleEmployment.be  This test is not yet approved or cleared by the [place] FDA and has been authorized for detection and/or diagnosis of SARS-CoV-2 by FDA under an Emergency Use Authorization (EUA). This EUA will remain in effect (meaning this test can be used) for the duration of the COVID-19 declaration under Section 564(b)(1) of the Act, 21 U.S.C. section 360bbb-3(b)(1), unless the authorization is terminated or revoked.  Performed at [facility], [place] [address] [place], [place] [address]      RN Pressure Injury Documentation:     Estimated  body mass index is 34.74 kg/m as calculated from the  following:   Height as of this encounter: '5\' 1"'$  (1.549 m).   Weight as of this encounter: 83.4 kg.  Malnutrition Type:   Malnutrition Characteristics:   Nutrition Interventions:   Radiology Studies: CT Head Wo Contrast  Result Date: 03/29/2021 CLINICAL DATA:  Altered mental status. EXAM: CT HEAD WITHOUT CONTRAST TECHNIQUE: Contiguous axial images were obtained from the base of the skull through the vertex without intravenous contrast. COMPARISON:  September 27, 2019 FINDINGS: Brain: There is mild cerebral atrophy with widening of the extra-axial spaces and ventricular dilatation. There are areas of decreased attenuation within the white matter tracts of the supratentorial brain, consistent with microvascular disease changes. Vascular: No hyperdense vessel or unexpected calcification. Skull: Normal. Negative for fracture or focal lesion. Sinuses/Orbits: Chronic diffuse calvarial thickening is seen with stable appearing mixed lytic and sclerotic areas. Other: None. IMPRESSION: 1. Generalized cerebral atrophy. 2. No acute intracranial abnormality. Electronically Signed   By: Virgina Norfolk M.D.   On: 03/29/2021 19:06   CT CHEST WO CONTRAST  Result Date: 03/29/2021 CLINICAL DATA:  Concern for infiltrate. EXAM: CT CHEST WITHOUT CONTRAST TECHNIQUE: Multidetector CT imaging of the chest was performed following the standard protocol without IV contrast. COMPARISON:  Chest radiograph dated 03/29/2021. FINDINGS: Evaluation of this exam is limited in the absence of intravenous contrast. Cardiovascular: Mild cardiomegaly. No pericardial effusion. There is a three-vessel coronary vascular calcification. Mild atherosclerotic calcification of the thoracic aorta. No aneurysmal dilatation. The central pulmonary arteries are grossly unremarkable. Mediastinum/Nodes: No hilar or mediastinal adenopathy. Evaluation however is limited in the absence of intravenous contrast. The esophagus and the thyroid gland are  grossly unremarkable. No mediastinal fluid collection. Lungs/Pleura: Bilateral lower lobe linear atelectasis/scarring. Focal consolidation, pleural effusion, or pneumothorax. The central airways are patent. Upper Abdomen: Cholecystectomy. Musculoskeletal: Median sternotomy wires. Degenerative changes of the spine. No acute osseous pathology. IMPRESSION: 1. No acute intrathoracic pathology. 2. Mild cardiomegaly with three-vessel coronary vascular calcification. 3. Aortic Atherosclerosis (ICD10-I70.0). Electronically Signed   By: Anner Crete M.D.   On: 03/29/2021 22:39   DG Chest Portable 1 View  Result Date: 03/29/2021 CLINICAL DATA:  Hypoglycemia. EXAM: PORTABLE CHEST 1 VIEW COMPARISON:  September 27, 2019 FINDINGS: Multiple sternal wires are seen. Low lung volumes are noted. Mild areas of atelectasis and/or infiltrate are seen along the infrahilar regions, bilaterally. There is no evidence of a pleural effusion or pneumothorax. The heart size and mediastinal contours are within normal limits. Multilevel degenerative changes seen throughout the thoracic spine. IMPRESSION: 1. Evidence of prior median sternotomy. 2. Mild bilateral infrahilar atelectasis and/or infiltrate. Electronically Signed   By: Virgina Norfolk M.D.   On: 03/29/2021 18:44    Scheduled Meds:  aspirin  81 mg Oral Daily   atorvastatin  40 mg Oral Daily   citalopram  10 mg Oral Daily   collagenase   Topical Daily   donepezil  2.5 mg Oral QHS   heparin  5,000 Units Subcutaneous Q8H   insulin aspart  0-5 Units Subcutaneous QHS   insulin aspart  0-6 Units Subcutaneous TID WC   metoprolol succinate  100 mg Oral Daily   QUEtiapine  100 mg Oral QHS   Continuous Infusions:   LOS: 0 days   Kerney Elbe, DO [facility] PAGER is on AMION  If 7PM-7AM, please contact night-coverage [email or web address]

## 2021-03-31 NOTE — Evaluation (Signed)
Occupational Therapy Evaluation Patient Details Name: Holly Weiss MRN: [id number] DOB: 05-16-1941 Today's Date: 03/31/2021    History of Present Illness Pt is an 80 y.o. female admitted 8/27 with dx of hypoglycemia. She presented to the ED for evaluation of lethargy and AMS. 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   Clinical Impression   Patient is an 80 year old female who was admitted for above. Patient was noted to live at home with family support. Currently, patient is TD for ADLs at this time. Patient was noted to have decreased strength, decreased activity tolerance, decreased endurance, decreased sitting balance, decreased ROM in BUE, and increased pain impacting ability to participate in ADLs. Patient would continue to benefit from skilled OT services at this time while admitted and after d/c to address noted deficits in order to improve overall safety and independence in ADLs.      Follow Up Recommendations  SNF    Equipment Recommendations  None recommended by OT    Recommendations for Other Services       Precautions / Restrictions Precautions Precautions: Fall;Other (comment) Precaution Comments: h/o aggitation with mobility attempts Restrictions Weight Bearing Restrictions: No      Mobility Bed Mobility Overal bed mobility: Needs Assistance Bed Mobility: Supine to Sit Rolling: Max assist;+2 for safety/equipment;+2 for physical assistance;Total assist   Supine to sit: Total assist;+2 for physical assistance;+2 for safety/equipment     General bed mobility comments: max assist to initiate moving legs ,bed pad and +2 total to sit upright .    Transfers                 General transfer comment: unable   to assess, posterior leaning.    Balance Overall balance assessment: Needs assistance Sitting-balance support: Feet unsupported;Bilateral upper extremity supported Sitting  balance-Leahy Scale: Poor Sitting balance - Comments: frequent   cues to lean forward, verbal and tactile cues. Postural control: Posterior lean                                 ADL either performed or assessed with clinical judgement   ADL Overall ADL's : Needs assistance/impaired Eating/Feeding: Minimal assistance;Sitting   Grooming: Wash/dry face;Oral care;Minimal assistance;Sitting Grooming Details (indicate cue type and reason): patient participated in task sitting on edge of bed with cues to correct sitting balance. Upper Body Bathing: Moderate assistance;Bed level   Lower Body Bathing: Bed level;Total assistance   Upper Body Dressing : Bed level;Maximal assistance   Lower Body Dressing: Bed level;Total assistance   Toilet Transfer: +2 for safety/equipment;+2 for physical assistance;Total assistance Toilet Transfer Details (indicate cue type and reason): unable to tolerate standing attempt on this date. patient was unable to tolerate light touch near heels. Toileting- Clothing Manipulation and Hygiene: Bed level;Total assistance       Functional mobility during ADLs: Total assistance       Vision Patient Visual Report: No change from baseline       Perception     Praxis      Pertinent Vitals/Pain Pain Location: BLE with touch Pain Descriptors / Indicators: Guarding;Grimacing Pain Intervention(s): Repositioned     Hand Dominance Right   Extremity/Trunk Assessment Upper Extremity Assessment Upper Extremity Assessment: RUE deficits/detail;LUE deficits/detail RUE Deficits / Details: patient was noted to have contractures in all digits with inability to complete full extension. patient reported she  used to be R hand dominant. patient was unable to use R digits to scrach nose. patient was unable to participate in AROM/PROM of R shoulder with stiffness noted. patient reported having fallen on shoulder in past. RUE Coordination: decreased fine  motor;decreased gross motor LUE Deficits / Details: patient was noted to have flexion contractues in DIP and MCP of thumb and all digits. patient is able to use LUE to wash face and scratch nose. LUE Coordination: decreased gross motor;decreased fine motor   Lower Extremity Assessment Lower Extremity Assessment: Defer to PT evaluation   Cervical / Trunk Assessment Cervical / Trunk Assessment: Kyphotic   Communication Communication Communication: No difficulties   Cognition Arousal/Alertness: Awake/alert (Simultaneous filing. User may not have seen previous data.) Behavior During Therapy: [facility] for tasks assessed/performed (Simultaneous filing. User may not have seen previous data.) Overall Cognitive Status: No family/caregiver present to determine baseline cognitive functioning (Simultaneous filing. User may not have seen previous data.) Area of Impairment: Memory                               General Comments: Oriented to self, month, day of week. when asked year patient reported its "20th or maybe 22nd". patient was oriented to year month and date. patient reported in beginning of conversation that her children helped her move around at home. 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 (Simultaneous filing. User may not have seen previous data.)   General Comments  patient was noted to have fluid blisters on right anterior shin and dressing on left anterior shin. patient indicated pain with movement of BLE with tolerating assist with movement only on plantar side of feet.    Exercises     Shoulder Instructions      Home Living Family/patient expects to be discharged to:: Private residence Living Arrangements: Children Available Help at Discharge: Family Type of Home: House Home Access: Level entry     Home Layout: Two level;Able to live on main level with bedroom/bathroom     Bathroom  Shower/Tub: Tub/shower unit         Home Equipment: Walker - 2 wheels;Walker - 4 wheels;Grab bars - toilet;Grab bars - tub/shower;Wheelchair - manual   Additional Comments: Home set up taken from previous admission (09/2019)      Prior Functioning/Environment Level of Independence: Needs assistance  Gait / Transfers Assistance Needed: Pt reports w/c for mobility. Does state she is able to transfer to/from w/c independently. ADL's / Homemaking Assistance Needed: family assists with ADLs   Comments: No family present. Unsure of validity of mobility information provided as pt is a poor historian.        OT Problem List: Decreased strength;Decreased activity tolerance;Decreased range of motion;Impaired balance (sitting and/or standing);Decreased safety awareness;Decreased cognition;Decreased knowledge of use of DME or AE;Impaired UE functional use      OT Treatment/Interventions: Self-care/ADL training;Therapeutic exercise;DME and/or AE instruction;Energy conservation;Therapeutic activities;Balance training;Patient/family education    OT Goals(Current goals can be found in the care plan section) Acute Rehab OT Goals Patient Stated Goal: to get a new job. 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 patient needs  24/7 caregiver support to transition home safely.       Co-evaluation PT/OT/SLP Co-Evaluation/Treatment: Yes Reason for Co-Treatment: Complexity of the patient's impairments (multi-system involvement);For patient/therapist safety PT goals addressed during session: Mobility/safety with mobility OT goals addressed during session: ADL's and self-care      AM-PAC OT "6 Clicks" Daily Activity     Outcome Measure Help from another person eating meals?: A  Little Help from another person taking care of personal grooming?: A Little Help from another person toileting, which includes using toliet, bedpan, or urinal?: Total Help from another person bathing (including washing, rinsing, drying)?: Total Help from another person to put on and taking off regular upper body clothing?: A Lot Help from another person to put on and taking off regular lower body clothing?: Total 6 Click Score: 11   End of Session    Activity Tolerance: Patient tolerated treatment well Patient left: in bed;with call bell/phone within reach;with bed alarm set  OT Visit Diagnosis: Muscle weakness (generalized) (M62.81);History of falling (Z91.81);Pain Pain - Right/Left: Right Pain - part of body: Leg                Time: [id number] OT Time Calculation (min): 26 min Charges:  OT General Charges $OT Visit: 1 Visit OT Evaluation $OT Eval Moderate Complexity: 1 Mod  Jackelyn Poling OTR/L, MS Acute Rehabilitation Department Office# [phone] Pager# [phone]   [place] 03/31/2021, 1:31 PM

## 2021-03-31 NOTE — Consult Note (Signed)
WOC Nurse Consult Note: Patient receiving care in [facility] Reason for Consult: LE wounds Wound type: Resolving DTPI to right posterior heel, overlying tissue intact. Measures approximately 3 cm x 3 cm.  Exam limited by patient yelling and stating that removing the silicone foam dressing was very painful to her.  Small, scattered areas along the right pretibial area that appear to have been weeping fluid, or approaching that.  Left medial LE has an ulcer the patient tells me was caused by her leg being scrapped on something while in her wheelchair.  The wound is approximately 2 cm x 1.5 cm with a yellow slough area. Pressure Injury POA: Yes/No/NA Measurement: Wound bed: Drainage (amount, consistency, odor)  Periwound: Dressing procedure/placement/frequency: Apply santyl to left anterior lower leg wound, cover with saline moistened gauze OVER THE WOUND ONLY, not the intact skin. Then a dry gauze, wrap with a few turns of kerlix.  Apply iodine from the swabsticks or swab pads from clean utility to the right posterior heel.  Allow to air dry. Place the foot into a Prevalon heel lift boot.  Do NOT apply a foam dressing to the heel.  Xeroform and kerlix to the RLE.  Prevalon boot for each foot.  Monitor the wound area(s) for worsening of condition such as: Signs/symptoms of infection,  Increase in size,  Development of or worsening of odor, Development of pain, or increased pain at the affected locations.  Notify the medical team if any of these develop.  Thank you for the consult.  Discussed plan of care with the patient.  [place] nurse will not follow at this time.  Please re-consult the [place] team if needed.  Val Riles, RN, MSN, CWOCN, CNS-BC, pager [phone]

## 2021-03-31 NOTE — Progress Notes (Signed)
Physical Therapy Treatment Patient Details Name: Holly Weiss MRN: [id number] DOB: [birth year] Today's Date: 03/31/2021    History of Present Illness Pt is an 80 y.o. female admitted 8/27 with dx of hypoglycemia. She presented to the ED for evaluation of lethargy and AMS. 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    PT Comments    The patient  resting in bed, initially anxious about having a bath. Patient  allowed therapists to assist  legs and trunk to sitting on bed edge, required total assistnace.  Patient sat on bed edge with min to mod assistance for balance, tends to lean posteriorly. Patient is pleasantly confused.No family present. Patient currently will require mechanical lift for OOB.     Follow Up Recommendations  SNF (family may insist on Home/ HHPT)     Equipment Recommendations  Hoyer lift   Recommendations for Other Services       Precautions / Restrictions Precautions Precautions: Fall;Other (comment) Precaution Comments: h/o aggitation with mobility attempts Restrictions Weight Bearing Restrictions: No    Mobility  Bed Mobility   Bed Mobility: Supine to Sit Rolling: Max assist;+2 for safety/equipment;+2 for physical assistance;Total assist   Supine to sit: Total assist;+2 for physical assistance;+2 for safety/equipment     General bed mobility comments: max assist to initiate moving legs ,bed pad and +2 total to sit upright .    Transfers                 General transfer comment: unable   to assess, posterior leaning.  Ambulation/Gait                 Stairs             Wheelchair Mobility    Modified Rankin (Stroke Patients Only)       Balance Overall balance assessment: Needs assistance Sitting-balance support: Feet unsupported;Bilateral upper extremity supported Sitting balance-Leahy Scale: Poor Sitting balance - Comments: frequent   cues to lean  forward, verbal and tactile cues. Postural control: Posterior lean                                  Cognition Arousal/Alertness: Awake/alert (Simultaneous filing. User may not have seen previous data.) Behavior During Therapy: [facility] for tasks assessed/performed (Simultaneous filing. User may not have seen previous data.) Overall Cognitive Status: No family/caregiver present to determine baseline cognitive functioning (Simultaneous filing. User may not have seen previous data.) Area of Impairment: Memory                               General Comments: Oriented to self, month, day of week. when asked year patient reported its "20th or maybe 22nd". patient was oriented to year month and date. patient reported in beginning of conversation that her children helped her move around at home. 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 (Simultaneous filing. User may not have seen previous data.)      Exercises      General Comments        Pertinent Vitals/Pain Pain Location: BLE with touch Pain Descriptors / Indicators: Guarding;Grimacing Pain Intervention(s): Repositioned    Home Living Family/patient expects to be discharged to:: Private residence Living Arrangements: Children Available Help  at Discharge: Family Type of Home: House Home Access: Level entry   Home Layout: Two level;Able to live on main level with bedroom/bathroom Home Equipment: Gilford Rile - 2 wheels;Walker - 4 wheels;Grab bars - toilet;Grab bars - tub/shower;Wheelchair - manual Additional Comments: Home set up taken from previous admission (09/2019)    Prior Function Level of Independence: Needs assistance  Gait / Transfers Assistance Needed: Pt reports w/c for mobility. Does state she is able to transfer to/from w/c independently. ADL's / Homemaking Assistance Needed: family assists with ADLs Comments: No family present.  Unsure of validity of mobility information provided as pt is a poor historian.   PT Goals (current goals can now be found in the care plan section) Progress towards PT goals: Progressing toward goals    Frequency    Min 2X/week      PT Plan Current plan remains appropriate;Frequency needs to be updated    Co-evaluation PT/OT/SLP Co-Evaluation/Treatment: Yes Reason for Co-Treatment: Complexity of the patient's impairments (multi-system involvement);For patient/therapist safety PT goals addressed during session: Mobility/safety with mobility OT goals addressed during session: ADL's and self-care      AM-PAC PT "6 Clicks" Mobility   Outcome Measure  Help needed turning from your back to your side while in a flat bed without using bedrails?: Total Help needed moving from lying on your back to sitting on the side of a flat bed without using bedrails?: Total Help needed moving to and from a bed to a chair (including a wheelchair)?: Total Help needed standing up from a chair using your arms (e.g., wheelchair or bedside chair)?: Total Help needed to walk in hospital room?: Total Help needed climbing 3-5 steps with a railing? : Total 6 Click Score: 6    End of Session   Activity Tolerance: Patient tolerated treatment well Patient left: in bed;with call bell/phone within reach;with bed alarm set Nurse Communication: Mobility status PT Visit Diagnosis: Other abnormalities of gait and mobility (R26.89)     Time: [id number] PT Time Calculation (min) (ACUTE ONLY): 26 min  Charges:  $Therapeutic Activity: 8-22 mins                     Tresa Endo PT Acute Rehabilitation Services Pager [phone] Office [phone]    Claretha Cooper 03/31/2021, 1:27 PM

## 2021-04-01 LAB — GLUCOSE, CAPILLARY
Glucose-Capillary: 24 mg/dL — CL (ref 70–99)
Glucose-Capillary: 123 mg/dL — ABNORMAL HIGH (ref 70–99)
Glucose-Capillary: 137 mg/dL — ABNORMAL HIGH (ref 70–99)
Glucose-Capillary: 200 mg/dL — ABNORMAL HIGH (ref 70–99)
Glucose-Capillary: 199 mg/dL — ABNORMAL HIGH (ref 70–99)

## 2021-04-01 LAB — CBC WITH DIFFERENTIAL/PLATELET
Abs Immature Granulocytes: 0.03 10*3/uL (ref 0.00–0.07)
Basophils Absolute: 0.1 10*3/uL (ref 0.0–0.1)
Basophils Relative: 1 %
Eosinophils Absolute: 1 10*3/uL — ABNORMAL HIGH (ref 0.0–0.5)
Eosinophils Relative: 15 %
HCT: 27.6 % — ABNORMAL LOW (ref 36.0–46.0)
Hemoglobin: 9.1 g/dL — ABNORMAL LOW (ref 12.0–15.0)
Immature Granulocytes: 0 %
Lymphocytes Relative: 29 %
Lymphs Abs: 2 10*3/uL (ref 0.7–4.0)
MCH: 29.9 pg (ref 26.0–34.0)
MCHC: 33 g/dL (ref 30.0–36.0)
MCV: 90.8 fL (ref 80.0–100.0)
Monocytes Absolute: 0.9 10*3/uL (ref 0.1–1.0)
Monocytes Relative: 13 %
Neutro Abs: 3 10*3/uL (ref 1.7–7.7)
Neutrophils Relative %: 42 %
Platelets: 207 10*3/uL (ref 150–400)
RBC: 3.04 MIL/uL — ABNORMAL LOW (ref 3.87–5.11)
RDW: 15.9 % — ABNORMAL HIGH (ref 11.5–15.5)
WBC: 6.9 10*3/uL (ref 4.0–10.5)
nRBC: 0 % (ref 0.0–0.2)

## 2021-04-01 LAB — COMPREHENSIVE METABOLIC PANEL
ALT: 15 U/L (ref 0–44)
AST: 19 U/L (ref 15–41)
Albumin: 2.7 g/dL — ABNORMAL LOW (ref 3.5–5.0)
Alkaline Phosphatase: 64 U/L (ref 38–126)
Anion gap: 5 (ref 5–15)
BUN: 29 mg/dL — ABNORMAL HIGH (ref 8–23)
CO2: 28 mmol/L (ref 22–32)
Calcium: 9.7 mg/dL (ref 8.9–10.3)
Chloride: 110 mmol/L (ref 98–111)
Creatinine, Ser: 1.2 mg/dL — ABNORMAL HIGH (ref 0.44–1.00)
GFR, Estimated: 46 mL/min — ABNORMAL LOW (ref >60)
Glucose, Bld: 126 mg/dL — ABNORMAL HIGH (ref 70–99)
Potassium: 4.3 mmol/L (ref 3.5–5.1)
Sodium: 143 mmol/L (ref 135–145)
Total Bilirubin: 0.6 mg/dL (ref 0.3–1.2)
Total Protein: 6.8 g/dL (ref 6.5–8.1)

## 2021-04-01 LAB — PHOSPHORUS: Phosphorus: 2.1 mg/dL — ABNORMAL LOW (ref 2.5–4.6)

## 2021-04-01 LAB — MAGNESIUM: Magnesium: 1.8 mg/dL (ref 1.7–2.4)

## 2021-04-01 MED ORDER — MAGNESIUM SULFATE 2 GM/50ML IV SOLN
2.0000 g | Freq: Once | INTRAVENOUS | Status: AC
  Administered 2021-04-01: 2 g via INTRAVENOUS
  Filled 2021-04-01: qty 50

## 2021-04-01 MED ORDER — K PHOS MONO-SOD PHOS DI & MONO 155-852-130 MG PO TABS
500.0000 mg | ORAL_TABLET | Freq: Two times a day (BID) | ORAL | Status: AC
  Administered 2021-04-01 (×2): 500 mg via ORAL
  Filled 2021-04-01 (×2): qty 2

## 2021-04-01 NOTE — NC FL2 (Signed)
[place] LEVEL OF CARE SCREENING TOOL     IDENTIFICATION  Patient Name: Holly Weiss Birthdate: 1940-10-21 Sex: female Admission Date (Current Location): 03/29/2021  [facility] and [place] Number:  Herbalist and Address:  [facility],  [address] [place], [place]      Provider Number: [id number]  Attending Physician Name and Address:  Kerney Elbe, DO  Relative Name and Phone Number:       Current Level of Care: Hospital Recommended Level of Care: [place] Prior Approval Number:    Date Approved/Denied:   PASRR Number: [id number]  Discharge Plan: SNF    Current Diagnoses: Patient Active Problem List   Diagnosis Date Noted   Hypoglycemia 03/29/2021   Dehydration 03/29/2021   Encephalopathy 03/29/2021   Type 2 diabetes mellitus with hypoglycemia ([place]) 03/29/2021   Left leg cellulitis 01/01/2021   Leg wound, left 01/01/2021   Decubitus ulcer, buttock 01/01/2021   Spondylolisthesis of lumbar region 07/10/2020   Unspecified osteoarthritis, unspecified site 07/10/2020   Spinal stenosis, cervical region 07/10/2020   Spinal stenosis, thoracolumbar region 07/10/2020   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   Dementia ([place])    Cervical stenosis of spinal canal 09/28/2019   Frequent falls 09/27/2019   Fracture of 5th metatarsal 09/27/2019   Cervical spinal stenosis 09/27/2019   Cord compression syndrome ([place]) 09/27/2019   Right shoulder pain 11/02/2018   Right rotator cuff tear 11/02/2018   Gait disorder 11/02/2018   AKI (acute kidney injury) ([place]) 02/09/2018   Stasis dermatitis 11/06/2016   CKD (chronic kidney disease), stage III ([place]) 09/24/2016   Rash 09/24/2016   Sudden right hearing loss 02/25/2016   Vertigo 02/25/2016   Multiple open wounds of lower leg 01/28/2016   Pain of right heel 01/28/2016   Osteoporosis 06/04/2015   Disorder of bone and cartilage  [id number]   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[place]) 01/08/2015   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   CAD in native artery 11/16/2014   EKG, abnormal 08/21/2014   Dystonia 07/20/2014   S/P hardware removal 07/19/2014   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 07/19/2014   Lower leg edema 06/22/2014   History of total right knee replacement 06/05/2014   Tibial fracture 01/16/2014   Cellulitis of leg, right 11/17/2013   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   Other dysphagia 11/04/2013   Morbid obesity ([place]) 10/22/2013   Diabetes type 2, uncontrolled ([place]) 10/21/2013   DJD (degenerative joint disease) of knee 07/12/2013   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   Anxiety and depression 02/14/2013   Visual hallucinations 02/14/2013   Pain in joint of right knee 02/14/2013   Uncontrolled diabetes mellitus with hyperglycemia, with long-term current use of insulin ([place])    Neck pain, acute 07/21/2011   Right knee pain 07/21/2011   Encounter for well adult exam with abnormal findings 01/27/2011   Edema 09/09/2010   KNEE PAIN, BILATERAL 09/01/2010   Arthropathy, multiple sites 08/18/2010   Chronic pain syndrome 01/21/2010   DYSPEPSIA 01/21/2010   FATIGUE 01/21/2010   DYSPNEA ON EXERTION 01/21/2010   MYOCARDIAL PERFUSION SCAN, WITH STRESS TEST, ABNORMAL 05/01/2009   SHOULDER PAIN, BILATERAL 04/09/2009   CHEST PAIN 04/09/2009   NAUSEA 04/09/2009   JOINT EFFUSION, LEFT KNEE 11/29/2008   Abdominal pain, epigastric 08/20/2008   ANEMIA-NOS 01/18/2008   Chronic diastolic congestive heart failure ([place]) 01/18/2008   Allergic rhinitis 01/18/2008  LOW BACK PAIN 01/18/2008   NEPHROLITHIASIS, HX OF 01/18/2008   Essential hypertension 10/31/2007   DIABETIC  RETINOPATHY 05/30/2007   Hyperlipidemia 05/30/2007   MIGRAINE HEADACHE 05/30/2007   Unspecified glaucoma 05/30/2007   Asthma 05/30/2007   MORBID OBESITY, HX OF 05/30/2007    Orientation RESPIRATION BLADDER Height  & Weight     Self  Normal Continent Weight: 83.4 kg Height:  '5\' 1"'$  (154.9 cm)  BEHAVIORAL SYMPTOMS/MOOD NEUROLOGICAL BOWEL NUTRITION STATUS      Continent Diet (Heart Healthy, Carb modified)  AMBULATORY STATUS COMMUNICATION OF NEEDS Skin   Extensive Assist (Was transfering to wheelchair independently PTA) Verbally Other (Comment) (MASD to abd folds, sacrum, and under breasts)                       Personal Care Assistance Level of Assistance  Bathing, Feeding, Dressing Bathing Assistance: Maximum assistance Feeding assistance: Limited assistance Dressing Assistance: Maximum assistance     Functional Limitations Info  Sight, Hearing, Speech Sight Info: Adequate Hearing Info: Impaired Speech Info: Adequate    SPECIAL CARE FACTORS FREQUENCY  PT (By licensed PT), OT (By licensed OT)     PT Frequency: 5 x weekly OT Frequency: 5 x weekly            Contractures Contractures Info: Present (Left and right hands)    Additional Factors Info  Code Status, Allergies Code Status Info: Full Allergies Info: Adhesive tape, Endocet, (oxycodone-acetaminophen), Latex           Current Medications (04/01/2021):  This is the current hospital active medication list Current Facility-Administered Medications  Medication Dose Route Frequency Provider Last Rate Last Admin   acetaminophen (TYLENOL) tablet 650 mg  650 mg Oral Q6H PRN Chotiner, Yevonne Aline, MD   650 mg at 03/31/21 2021   Or   acetaminophen (TYLENOL) suppository 650 mg  650 mg Rectal Q6H PRN Chotiner, Yevonne Aline, MD       albuterol (PROVENTIL) (2.5 MG/3ML) 0.083% nebulizer solution 3 mL  3 mL Inhalation Q6H PRN Chotiner, Yevonne Aline, MD       aspirin chewable tablet 81 mg  81 mg Oral Daily Chotiner, Yevonne Aline, MD   81 mg at 04/01/21 1103   atorvastatin (LIPITOR) tablet 40 mg  40 mg Oral Daily Chotiner, Yevonne Aline, MD   40 mg at 04/01/21 [id number]   citalopram (CELEXA) tablet 10 mg  10 mg Oral Daily Chotiner, Yevonne Aline, MD   10 mg  at 04/01/21 [id number]   collagenase (SANTYL) ointment   Topical Daily Raiford Noble [place], [place]   Given at 04/01/21 [id number]   donepezil (ARICEPT) tablet 2.5 mg  2.5 mg Oral QHS Chotiner, Yevonne Aline, MD   2.5 mg at 03/31/21 2205   heparin injection 5,000 Units  5,000 Units Subcutaneous Q8H Chotiner, Yevonne Aline, MD   5,000 Units at 04/01/21 0528   insulin aspart (novoLOG) injection 0-5 Units  0-5 Units Subcutaneous QHS Sheikh, Omair Latif, DO       insulin aspart (novoLOG) injection 0-6 Units  0-6 Units Subcutaneous TID WC Raiford Noble [place], DO   1 Units at 04/01/21 [id number]   metoprolol succinate (TOPROL-XL) 24 hr tablet 100 mg  100 mg Oral Daily Chotiner, Yevonne Aline, MD   100 mg at 04/01/21 0950   phosphorus (K PHOS NEUTRAL) tablet 500 mg  500 mg Oral BID Raiford Noble [place], DO   500 mg at 04/01/21 0949   QUEtiapine (SEROQUEL) tablet 100 mg  100 mg Oral QHS Chotiner, Yevonne Aline, MD   100 mg at 03/31/21 2205     Discharge Medications: Please see discharge summary for a list of discharge medications.  Relevant Imaging Results:  Relevant Lab Results:   Additional Information ss#[id number]  Covid vaccination x3  Daryl Quiros, Marjie Skiff, RN

## 2021-04-01 NOTE — Progress Notes (Signed)
PROGRESS NOTE    Holly Weiss  [id number] DOB: 18-Feb-1941 DOA: 03/29/2021 PCP: Biagio Borg, MD   Brief Narrative:  Patient is an 80 year old obese African-American female with a past medical history significant for but not limited to dementia, diabetes mellitus type 2 on Lantus, anemia of chronic kidney disease, chronic kidney disease stage IIIb, history of chronic pain due to spinal stenosis, hypertension, hyperlipidemia, decreased mobility as well as on the comorbidities who presented with a chief complaint of lethargy and decreased mental status.  Normally patient is very talkative with family and yesterday morning she did take her morning dose of Lantus but did not eat very much.  The family took her up to bring with her to visit her grandson that is in college and they are all she was in the car she would fall asleep multiple times was not like her.  They got to the destination she would not get out of the car and had difficulty arousing her.  She refused to eat or drink anything and decided to come back home.  After bringing her back to the house the daughter went to the store to do some shopping and when she got home the patient not any better so they brought her to the cell emergency room for further evaluation.  She lives at home with family members rotating staying with her.  She is in a wheelchair all the time and is not able to ambulate on her own and needs assistance getting in and out of the wheelchair from numerous.  Home physical therapy does come to see her according her family.  In the ED she is noted to be lethargic with decreased responsiveness initially.  Her blood glucose was found to be less than 30 and she was given amp of D50.  Her mental status subsequently improved but after initial dose of the D50 her glucose then again dropped and she is started on D10 infusion.  Labs were obtained and showed a mild increase in her BUN/creatinine.  She was admitted for further management of  her hyperglycemia.  She is noted to have a negative head CT but her admission glucose on CMP was 29.  Her blood sugars have improved significantly since admission after her insulin has been held however she was a little hypoglycemic this yesterday morning in the 60s.  She only received very minimal sliding scale.  We will need to continue to hold her long-acting insulin and adjust for D/C and after discussion with the Diabetes Coordinator will discharge her on 5 units.  PT OT evaluated and recommending SNF and patient's family is in agreement with this now.  TOC to assist with placement  Assessment & Plan:   Principal Problem:   Hypoglycemia Active Problems:   Essential hypertension   CKD (chronic kidney disease), stage III (HCC)   Dementia (HCC)   Dehydration   Encephalopathy   Type 2 diabetes mellitus with hypoglycemia (HCC)  Hypoglycemia in the setting of Diabetes Mellitus Type 2 -She is placed on MedSurg floor for observation and presented with decreased mental status and was found to have a blood sugar 23 -She is given D50 but continue to run low blood sugars in the ER and was placed on D10 infusion -Blood sugar is now improved over 100 and D10 infusion is now stopped -We will continue to hold her home Lantus and she was placed on a very sensitive NovoLog/scale insulin before meals and at bedtime -She was ordered for D5  normal saline at 50 MLS per hour but this has now been stopped and her D10 infusion is now stopped as well -Continue to monitor blood sugars carefully -Likely with her renal dysfunction the insulin that she was taking was too much and since her kidney function is off the insulin was not clearly like it should leading to her persistent hypoglycemia -CBGs now ranging from 123-186 -Repeat hemoglobin A1c is now 7.6 which is significantly down from her previous 11.3  -After discussion with the Diabetes Education coordinator she will need a very small dose of of Lantus and  the recommendation is for her to be discharged on 5 units   Essential Hypertension -Continue with Metoprolol Succinate 100 mg po Daily  -Continue to monitor blood pressures per protocol -Last blood pressure reading was 143/61  Hyponatremia -Patient's Na+ was 141 -> 146 -> 143 -Continue to Monitor and Trend Na+ and repeat CMP in the AM   AKI on CKD stage IIIb Dehydration -Patient presented with a BUN/Cr of 40/2.00 -> 43/1.72 -> 40/1.40 -> 29/1.20 -IVF now stopped  -Holding Lasix and may resume in the AM -Avoid nephrotoxic medications, contrast dyes, hypotension and renally dose medications -Continue monitor and trend renal function repeat CMP in a.m.  Normocytic Anemia/Anemia of Chronic Kidney Disease -Stable. Patient's Hgb/Hct went from 8.8/26.0 -> 9.7/31.7 -> 8.9/28.1 -> 9.1/27.6 -Check Anemia Panel in the outpatient setting -Continue to Monitor For S/Sx of Bleeding; Currently no overt bleeding noted -Repeat CBC intermittently  Hypophosphatemia -Mild. Phos Level went from 2.7 -> 2.1 -Replete with po K Phos Neutral 500 mg po BID x2 -Continue to Monitor and Replete as Necessary -Repeat Phos Level in the AM   Chronic Dementia -C/w Donepezil 2.5 mg p.o. nightly and Quetiapine 100 g p.o. nightly  Depression and Anxiety -Continue with Citalopram 10 mg po Daily and Quetiapine 100 mg po qHS  Hyperlipidemia -Continue with Atorvastatin 40 mg po Daily  Lower extremity wounds, present on admission -WOC nurse consulted for further evaluation recommendations  Acute encephalopathy in the setting of hypoglycemia superimposed on chronic dementia, improved  -Patient at baseline has dementia and was more lethargic and had a decreased mental status secondary to hypoglycemia -Mental status is improved and she remains confused but not agitated and is more awake and alert and likely at her baseline  Obesity -Complicates overall prognosis and care -Estimated body mass index is 34.74 kg/m  as calculated from the following:   Height as of this encounter: '5\' 1"'$  (1.549 m).   Weight as of this encounter: 83.4 kg. -Weight Loss and Dietary counseling given   DVT prophylaxis: Heparin 5,000 sq q8h Code Status: FULL CODE  Family Communication: No family present at bedside  Disposition Plan: Anticipating D/Cing to SNF as Blood Sugars have improved   Status is: Inpatient   The patient will require care spanning > 2 midnights and should be moved to inpatient because: Ongoing diagnostic testing needed not appropriate for outpatient work up, Unsafe d/c plan, IV treatments appropriate due to intensity of illness or inability to take PO, and Inpatient level of care appropriate due to severity of illness  Dispo: The patient is from: Home              Anticipated d/c is to: SNF (Family initially refusing SNF but now agreeable)              Patient currently is not medically stable to d/c.   Difficult to place patient No  Consultants:  None  Procedures: None   Antimicrobials:  Anti-infectives (From admission, onward)    Start     Dose/Rate Route Frequency Ordered Stop   03/29/21 2145  cefTRIAXone (ROCEPHIN) 1 g in sodium chloride 0.9 % 100 mL IVPB        1 g 200 mL/hr over 30 Minutes Intravenous  Once 03/29/21 2137 03/29/21 2231   03/29/21 2145  azithromycin (ZITHROMAX) 500 mg in sodium chloride 0.9 % 250 mL IVPB        500 mg 250 mL/hr over 60 Minutes Intravenous  Once 03/29/21 2137 03/29/21 2335        Subjective: Seen and examined at bedside and She was sitting up in bed eating a muffin and was pleasant. No CP or SOB. No complaints and awaiting SNF offers and Ship broker. No lightheadedness or dizziness. No other concerns or complaints at this time.   Objective: Vitals:   03/31/21 1500 03/31/21 1600 03/31/21 2204 04/01/21 0539  BP:   (!) 142/64 (!) 143/61  Pulse:   64 (!) 58  Resp: '20 16 17 17  '$ Temp:   98.2 F (36.8 C) 97.8 F (36.6 C)  TempSrc:   Oral  Oral  SpO2:   97% 97%  Weight:      Height:        Intake/Output Summary (Last 24 hours) at 04/01/2021 [id number] Last data filed at 03/31/2021 1500 Gross per 24 hour  Intake 600 ml  Output --  Net 600 ml    Filed Weights   03/29/21 2306  Weight: 83.4 kg   Examination:  Physical Exam:  Constitutional: WN/WD obese African-American female in no acute distress she is pleasantly demented and appears calm and comfortable eating her muffin Eyes: Lids and conjunctivae normal, sclerae anicteric  ENMT: External Ears, Nose appear normal. Grossly normal hearing.  Neck: Appears normal, supple, no cervical masses, normal ROM, no appreciable thyromegaly; no appreciable JVD Respiratory: Diminished to auscultation bilaterally with coarse breath sounds, no wheezing, rales, rhonchi or crackles. Normal respiratory effort and patient is not tachypenic. No accessory muscle use.  Unlabored breathing Cardiovascular: RRR, no murmurs / rubs / gallops. S1 and S2 auscultated.  Has some mild lower extremity edema Abdomen: Soft, non-tender, distended secondary body habitus. Bowel sounds positive.  GU: Deferred. Musculoskeletal: No clubbing / cyanosis of digits/nails. No joint deformity upper and lower extremities. Skin: Has bilateral leg wounds no induration; Warm and dry.  Neurologic: CN 2-12 grossly intact with no focal deficits. Romberg sign and cerebellar reflexes not assessed.  Psychiatric: Normal judgment and insight. Alert and oriented x 3. Normal mood and appropriate affect.   Data Reviewed: I have personally reviewed following labs and imaging studies  CBC: Recent Labs  Lab 03/29/21 1807 03/29/21 1844 03/30/21 0600 03/31/21 0527 04/01/21 0543  WBC 9.6  --  8.2 7.1 6.9  NEUTROABS 5.3  --   --  2.8 3.0  HGB 9.9* 8.8* 9.7* 8.9* 9.1*  HCT 31.4* 26.0* 31.7* 28.1* 27.6*  MCV 93.7  --  96.1 93.0 90.8  PLT 248  --  211 207 [id number]    Basic Metabolic Panel: Recent Labs  Lab 03/29/21 1807 03/29/21 1844  03/30/21 0600 03/31/21 0527 04/01/21 0543  NA 143 145 141 146* 143  K 3.5 3.6 3.8 3.5 4.3  CL 107 107 109 111 110  CO2 27  --  '23 30 28  '$ GLUCOSE 29* 117* 241* 75 126*  BUN 47* 40* 43* 39* 29*  CREATININE 1.98* 2.00* 1.72* 1.40* 1.20*  CALCIUM 9.4  --  9.3 9.6 9.7  MG  --   --   --  1.6* 1.8  PHOS  --   --   --  2.7 2.1*    GFR: Estimated Creatinine Clearance: 36.6 mL/min (A) (by C-G formula based on SCr of 1.2 mg/dL (H)). Liver Function Tests: Recent Labs  Lab 03/29/21 1807 03/31/21 0527 04/01/21 0543  AST '24 18 19  '$ ALT '17 16 15  '$ ALKPHOS 65 55 64  BILITOT 0.9 0.6 0.6  PROT 7.5 6.7 6.8  ALBUMIN 3.1* 2.7* 2.7*    Recent Labs  Lab 03/29/21 1807  LIPASE 21    No results for input(s): AMMONIA in the last 168 hours. Coagulation Profile: No results for input(s): INR, PROTIME in the last 168 hours. Cardiac Enzymes: No results for input(s): CKTOTAL, CKMB, CKMBINDEX, TROPONINI in the last 168 hours. BNP (last 3 results) No results for input(s): PROBNP in the last 8760 hours. HbA1C: Recent Labs    03/30/21 0600  HGBA1C 7.6*    CBG: Recent Labs  Lab 03/31/21 0818 03/31/21 1207 03/31/21 1735 03/31/21 2205 04/01/21 0817  GLUCAP 154* 186* 180* 169* 123*    Lipid Profile: No results for input(s): CHOL, HDL, LDLCALC, TRIG, CHOLHDL, LDLDIRECT in the last 72 hours. Thyroid Function Tests: No results for input(s): TSH, T4TOTAL, FREET4, T3FREE, THYROIDAB in the last 72 hours. Anemia Panel: No results for input(s): VITAMINB12, FOLATE, FERRITIN, TIBC, IRON, RETICCTPCT in the last 72 hours. Sepsis Labs: Recent Labs  Lab 03/29/21 2013  PROCALCITON <0.10    Recent Results (from the past 240 hour(s))  Resp Panel by RT-PCR (Flu A&B, Covid) Nasopharyngeal Swab     Status: None   Collection Time: 03/29/21  6:35 PM   Specimen: Nasopharyngeal Swab; Nasopharyngeal(NP) swabs in vial transport medium  Result Value Ref Range Status   SARS Coronavirus 2 by RT PCR NEGATIVE  NEGATIVE Final    Comment: (NOTE) SARS-CoV-2 target nucleic acids are NOT DETECTED.  The SARS-CoV-2 RNA is generally detectable in upper respiratory specimens during the acute phase of infection. The lowest concentration of SARS-CoV-2 viral copies this assay can detect is 138 copies/mL. A negative result does not preclude SARS-Cov-2 infection and should not be used as the sole basis for treatment or other patient management decisions. A negative result may occur with  improper specimen collection/handling, submission of specimen other than nasopharyngeal swab, presence of viral mutation(s) within the areas targeted by this assay, and inadequate number of viral copies(<138 copies/mL). A negative result must be combined with clinical observations, patient history, and epidemiological information. The expected result is Negative.  Fact Sheet for Patients:  EntrepreneurPulse.com.au  Fact Sheet for Healthcare Providers:  IncredibleEmployment.be  This test is no t yet approved or cleared by the [place] FDA and  has been authorized for detection and/or diagnosis of SARS-CoV-2 by FDA under an Emergency Use Authorization (EUA). This EUA will remain  in effect (meaning this test can be used) for the duration of the COVID-19 declaration under Section 564(b)(1) of the Act, 21 U.S.C.section 360bbb-3(b)(1), unless the authorization is terminated  or revoked sooner.       Influenza A by PCR NEGATIVE NEGATIVE Final   Influenza B by PCR NEGATIVE NEGATIVE Final    Comment: (NOTE) The Xpert Xpress SARS-CoV-2/FLU/RSV plus assay is intended as an aid in the diagnosis of influenza from Nasopharyngeal swab specimens and should not be used as a sole basis for treatment. Nasal washings and aspirates are unacceptable  for Xpert Xpress SARS-CoV-2/FLU/RSV testing.  Fact Sheet for Patients: EntrepreneurPulse.com.au  Fact Sheet for Healthcare  Providers: IncredibleEmployment.be  This test is not yet approved or cleared by the [place] FDA and has been authorized for detection and/or diagnosis of SARS-CoV-2 by FDA under an Emergency Use Authorization (EUA). This EUA will remain in effect (meaning this test can be used) for the duration of the COVID-19 declaration under Section 564(b)(1) of the Act, 21 U.S.C. section 360bbb-3(b)(1), unless the authorization is terminated or revoked.  Performed at [facility], [place] [address], [place], [place] [address]      RN Pressure Injury Documentation:     Estimated body mass index is 34.74 kg/m as calculated from the following:   Height as of this encounter: '5\' 1"'$  (1.549 m).   Weight as of this encounter: 83.4 kg.  Malnutrition Type:   Malnutrition Characteristics:   Nutrition Interventions:   Radiology Studies: No results found.  Scheduled Meds:  aspirin  81 mg Oral Daily   atorvastatin  40 mg Oral Daily   citalopram  10 mg Oral Daily   collagenase   Topical Daily   donepezil  2.5 mg Oral QHS   heparin  5,000 Units Subcutaneous Q8H   insulin aspart  0-5 Units Subcutaneous QHS   insulin aspart  0-6 Units Subcutaneous TID WC   metoprolol succinate  100 mg Oral Daily   phosphorus  500 mg Oral BID   QUEtiapine  100 mg Oral QHS   Continuous Infusions:  magnesium sulfate bolus IVPB       LOS: 1 day   Kerney Elbe, DO [facility] PAGER is on AMION  If 7PM-7AM, please contact night-coverage [email or web address]

## 2021-04-01 NOTE — TOC Initial Note (Signed)
Transition of Care [facility]) - Initial/Assessment Note    Patient Details  Name: Holly Weiss MRN: [id number] Date of Birth: [birth year]  [facility]) CM/SW Contact:    Elinora Weigand, Marjie Skiff, RN Phone Number: 04/01/2021, 11:08 AM  Clinical Narrative:                 Per MD both daughters have consented to pt going to SNF short term. FL2 faxed out to area SNF. TOC will follow up with SNF bed offers when received.  Expected Discharge Plan: Skilled Nursing Facility Barriers to Discharge: Continued Medical Work up   Patient Goals and CMS Choice        Expected Discharge Plan and Services Expected Discharge Plan: [place]   Discharge Planning Services: CM Consult                      Prior Living Arrangements/Services   Lives with:: Adult Children          Need for Family Participation in Patient Care: Yes (Comment) Care giver support system in place?: Yes (comment)   Criminal Activity/Legal Involvement Pertinent to Current Situation/Hospitalization: No - Comment as needed  Activities of Daily Living Home Assistive Devices/Equipment: Wheelchair ADL Screening (condition at time of admission) Patient's cognitive ability adequate to safely complete daily activities?: No Is the patient deaf or have difficulty hearing?: No Does the patient have difficulty seeing, even when wearing glasses/contacts?: No Does the patient have difficulty concentrating, remembering, or making decisions?: Yes Patient able to express need for assistance with ADLs?: Yes Does the patient have difficulty dressing or bathing?: Yes Independently performs ADLs?: No Communication: Independent Dressing (OT): Needs assistance Is this a change from baseline?: Pre-admission baseline Grooming: Dependent Is this a change from baseline?: Pre-admission baseline Feeding: Needs assistance Is this a change from baseline?: Pre-admission baseline Bathing: Dependent Is this a change from  baseline?: Pre-admission baseline Toileting: Dependent Is this a change from baseline?: Pre-admission baseline In/Out Bed: Dependent Is this a change from baseline?: Pre-admission baseline Walks in Home: Dependent Is this a change from baseline?: Pre-admission baseline Does the patient have difficulty walking or climbing stairs?: Yes Weakness of Legs: Both Weakness of Arms/Hands: Both  Permission Sought/Granted                  Emotional Assessment       Orientation: : Oriented to Self      Admission diagnosis:  Dehydration [E86.0] Hypoglycemia [E16.2] Encephalopathy [G93.40] Community acquired pneumonia, unspecified laterality [J18.9] Patient Active Problem List   Diagnosis Date Noted   Hypoglycemia 03/29/2021   Dehydration 03/29/2021   Encephalopathy 03/29/2021   Type 2 diabetes mellitus with hypoglycemia ([place]) 03/29/2021   Left leg cellulitis 01/01/2021   Leg wound, left 01/01/2021   Decubitus ulcer, buttock 01/01/2021   Spondylolisthesis of lumbar region 07/10/2020   Unspecified osteoarthritis, unspecified site 07/10/2020   Spinal stenosis, cervical region 07/10/2020   Spinal stenosis, thoracolumbar region 07/10/2020   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   Dementia ([place])    Cervical stenosis of spinal canal 09/28/2019   Frequent falls 09/27/2019   Fracture of 5th metatarsal 09/27/2019   Cervical spinal stenosis 09/27/2019   Cord compression syndrome ([place]) 09/27/2019   Right shoulder pain 11/02/2018   Right rotator cuff tear 11/02/2018   Gait disorder 11/02/2018   AKI (acute kidney injury) ([place]) 02/09/2018   Stasis dermatitis 11/06/2016   CKD (chronic kidney disease), stage  III ([place]) 09/24/2016   Rash 09/24/2016   Sudden right hearing loss 02/25/2016   Vertigo 02/25/2016   Multiple open wounds of lower leg 01/28/2016   Pain of right heel 01/28/2016   Osteoporosis 06/04/2015   Disorder of bone and cartilage  [id number]   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[place]) 01/08/2015   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   CAD in native artery 11/16/2014   EKG, abnormal 08/21/2014   Dystonia 07/20/2014   S/P hardware removal 07/19/2014   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 07/19/2014   Lower leg edema 06/22/2014   History of total right knee replacement 06/05/2014   Tibial fracture 01/16/2014   Cellulitis of leg, right 11/17/2013   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   Other dysphagia 11/04/2013   Morbid obesity ([place]) 10/22/2013   Diabetes type 2, uncontrolled ([place]) 10/21/2013   DJD (degenerative joint disease) of knee 07/12/2013   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   Anxiety and depression 02/14/2013   Visual hallucinations 02/14/2013   Pain in joint of right knee 02/14/2013   Uncontrolled diabetes mellitus with hyperglycemia, with long-term current use of insulin ([place])    Neck pain, acute 07/21/2011   Right knee pain 07/21/2011   Encounter for well adult exam with abnormal findings 01/27/2011   Edema 09/09/2010   KNEE PAIN, BILATERAL 09/01/2010   Arthropathy, multiple sites 08/18/2010   Chronic pain syndrome 01/21/2010   DYSPEPSIA 01/21/2010   FATIGUE 01/21/2010   DYSPNEA ON EXERTION 01/21/2010   MYOCARDIAL PERFUSION SCAN, WITH STRESS TEST, ABNORMAL 05/01/2009   SHOULDER PAIN, BILATERAL 04/09/2009   CHEST PAIN 04/09/2009   NAUSEA 04/09/2009   JOINT EFFUSION, LEFT KNEE 11/29/2008   Abdominal pain, epigastric 08/20/2008   ANEMIA-NOS 01/18/2008   Chronic diastolic congestive heart failure ([place]) 01/18/2008   Allergic rhinitis 01/18/2008   LOW BACK PAIN 01/18/2008   NEPHROLITHIASIS, HX OF 01/18/2008   Essential hypertension 10/31/2007   DIABETIC  RETINOPATHY 05/30/2007   Hyperlipidemia 05/30/2007   MIGRAINE HEADACHE 05/30/2007   Unspecified glaucoma 05/30/2007   Asthma 05/30/2007   MORBID OBESITY, HX OF 05/30/2007   PCP:  Biagio Borg, MD Pharmacy:    [facility] [place], [place] [address] [place] [address] Phone: [phone] Fax: [phone]     Social Determinants of Health (SDOH) Interventions    Readmission Risk Interventions Readmission Risk Prevention Plan 04/01/2021  Transportation Screening Complete  PCP or Specialist Appt within 5-7 Days Complete  Home Care Screening Complete  Medication Review (RN CM) Complete  Some recent data might be hidden

## 2021-04-02 DIAGNOSIS — G9341 Metabolic encephalopathy: Secondary | ICD-10-CM

## 2021-04-02 DIAGNOSIS — N179 Acute kidney failure, unspecified: Secondary | ICD-10-CM

## 2021-04-02 DIAGNOSIS — F32A Depression, unspecified: Secondary | ICD-10-CM

## 2021-04-02 DIAGNOSIS — L8996 Pressure-induced deep tissue damage of unspecified site: Secondary | ICD-10-CM

## 2021-04-02 DIAGNOSIS — F419 Anxiety disorder, unspecified: Secondary | ICD-10-CM

## 2021-04-02 DIAGNOSIS — R7989 Other specified abnormal findings of blood chemistry: Secondary | ICD-10-CM

## 2021-04-02 DIAGNOSIS — N1832 Chronic kidney disease, stage 3b: Secondary | ICD-10-CM

## 2021-04-02 LAB — CBC WITH DIFFERENTIAL/PLATELET
Abs Immature Granulocytes: 0.03 10*3/uL (ref 0.00–0.07)
Basophils Absolute: 0.1 10*3/uL (ref 0.0–0.1)
Basophils Relative: 1 %
Eosinophils Absolute: 0.9 10*3/uL — ABNORMAL HIGH (ref 0.0–0.5)
Eosinophils Relative: 13 %
HCT: 30.1 % — ABNORMAL LOW (ref 36.0–46.0)
Hemoglobin: 9.6 g/dL — ABNORMAL LOW (ref 12.0–15.0)
Immature Granulocytes: 0 %
Lymphocytes Relative: 31 %
Lymphs Abs: 2.2 10*3/uL (ref 0.7–4.0)
MCH: 28.8 pg (ref 26.0–34.0)
MCHC: 31.9 g/dL (ref 30.0–36.0)
MCV: 90.4 fL (ref 80.0–100.0)
Monocytes Absolute: 0.8 10*3/uL (ref 0.1–1.0)
Monocytes Relative: 12 %
Neutro Abs: 3.1 10*3/uL (ref 1.7–7.7)
Neutrophils Relative %: 43 %
Platelets: 214 10*3/uL (ref 150–400)
RBC: 3.33 MIL/uL — ABNORMAL LOW (ref 3.87–5.11)
RDW: 15.9 % — ABNORMAL HIGH (ref 11.5–15.5)
WBC: 7.2 10*3/uL (ref 4.0–10.5)
nRBC: 0 % (ref 0.0–0.2)

## 2021-04-02 LAB — RESP PANEL BY RT-PCR (FLU A&B, COVID) ARPGX2
Influenza A by PCR: NEGATIVE
Influenza B by PCR: NEGATIVE
SARS Coronavirus 2 by RT PCR: NEGATIVE

## 2021-04-02 LAB — PHOSPHORUS: Phosphorus: 3.2 mg/dL (ref 2.5–4.6)

## 2021-04-02 LAB — COMPREHENSIVE METABOLIC PANEL
ALT: 17 U/L (ref 0–44)
AST: 21 U/L (ref 15–41)
Albumin: 2.7 g/dL — ABNORMAL LOW (ref 3.5–5.0)
Alkaline Phosphatase: 68 U/L (ref 38–126)
Anion gap: 8 (ref 5–15)
BUN: 27 mg/dL — ABNORMAL HIGH (ref 8–23)
CO2: 28 mmol/L (ref 22–32)
Calcium: 9.6 mg/dL (ref 8.9–10.3)
Chloride: 109 mmol/L (ref 98–111)
Creatinine, Ser: 1.13 mg/dL — ABNORMAL HIGH (ref 0.44–1.00)
GFR, Estimated: 49 mL/min — ABNORMAL LOW (ref >60)
Glucose, Bld: 151 mg/dL — ABNORMAL HIGH (ref 70–99)
Potassium: 4.4 mmol/L (ref 3.5–5.1)
Sodium: 145 mmol/L (ref 135–145)
Total Bilirubin: 0.6 mg/dL (ref 0.3–1.2)
Total Protein: 6.9 g/dL (ref 6.5–8.1)

## 2021-04-02 LAB — GLUCOSE, CAPILLARY
Glucose-Capillary: 151 mg/dL — ABNORMAL HIGH (ref 70–99)
Glucose-Capillary: 242 mg/dL — ABNORMAL HIGH (ref 70–99)
Glucose-Capillary: 165 mg/dL — ABNORMAL HIGH (ref 70–99)
Glucose-Capillary: 225 mg/dL — ABNORMAL HIGH (ref 70–99)

## 2021-04-02 LAB — MAGNESIUM: Magnesium: 1.9 mg/dL (ref 1.7–2.4)

## 2021-04-02 MED ORDER — LANTUS SOLOSTAR 100 UNIT/ML ~~LOC~~ SOPN: 5.0000 [IU] | PEN_INJECTOR | Freq: Two times a day (BID) | SUBCUTANEOUS | 11 refills | Status: AC

## 2021-04-02 MED ORDER — INSULIN ASPART 100 UNIT/ML IJ SOLN: 0.0000 [IU] | Freq: Three times a day (TID) | INTRAMUSCULAR | 11 refills | Status: AC

## 2021-04-02 MED ORDER — INSULIN GLARGINE-YFGN 100 UNIT/ML ~~LOC~~ SOLN
5.0000 [IU] | Freq: Two times a day (BID) | SUBCUTANEOUS | Status: DC
  Administered 2021-04-02 (×2): 5 [IU] via SUBCUTANEOUS
  Filled 2021-04-02 (×3): qty 0.05

## 2021-04-02 MED ORDER — DONEPEZIL HCL 5 MG PO TABS: 2.5000 mg | ORAL_TABLET | Freq: Every day | ORAL | Status: AC

## 2021-04-02 MED ORDER — ACETAMINOPHEN 500 MG PO TABS: 500.0000 mg | ORAL_TABLET | Freq: Three times a day (TID) | ORAL | Status: DC

## 2021-04-02 NOTE — Discharge Summary (Signed)
Physician Discharge Summary  Holly Weiss IRS:[id number] DOB: [birth year] DOA: 03/29/2021  PCP: Biagio Borg, MD  Admit date: 03/29/2021 Discharge date: 04/02/2021  Admitted From: Home Disposition: SNF  Recommendations for Outpatient Follow-up:  Follow ups as below. Please obtain CBC/BMP/Mag at follow up Please follow up on the following pending results: None   Discharge Condition: Stable CODE STATUS: Full code  Hospital Course: 80 year old F with PMH of dementia, IDDM-2, CKD-3B, anemia of chronic disease, HTN, HLD, chronic back pain due to spinal stenosis, debility with wheelchair use and pressure skin injury presenting with lethargy and mental status change likely due to hypoglycemia in the setting of IDDM-2 and sedating medications.  She was a started on D10 infusion with improvement in her mental status and blood glucose.  Eventually restarted on renal dose sliding scale insulin and CBG remained stable.  Evaluated by therapy who recommended SNF.  On the day of discharge, patient was oriented x4 except date but poor insight into his situation.  See individual problem list below for more on hospital course.  Discharge Diagnoses:  Hypoglycemia in the setting of uncontrolled IDDM-2: A1c 7.6% from 11.3 previously.  CBG elevated.  Recent Labs  Lab 04/01/21 1158 04/01/21 1705 04/01/21 2201 04/02/21 0742 04/02/21 1154  GLUCAP 137* 200* 199* 151* 242*  -Continue SSI renal with night coverage -Add Lantus 5 units twice daily on discharge -Adjust insulin as appropriate -Continue statin. -Liberate diet -Further adjustment as appropriate  Acute metabolic encephalopathy-likely due to hypoglycemia and polypharmacy. -Discontinue tramadol and Zanaflex -Scheduled Tylenol for pain -Treated hypoglycemia as above.  AKI on CKD-3B/azotemia-likely prerenal in setting of poor p.o. intake.  Seems to have resolved. Recent Labs    07/17/20 1606 07/24/20 2146 11/01/20 1622 03/20/21 2130  03/29/21 1807 03/29/21 1844 03/30/21 0600 03/31/21 0527 04/01/21 0543 04/02/21 0753  BUN 65* 56* 35* 37* 47* 40* 43* 39* 29* 27*  CREATININE 1.52* 1.87* 1.13* 1.69* 1.98* 2.00* 1.72* 1.40* 1.20* 1.13*  -Recheck renal function in 1 to 2 weeks -Discontinued NSAIDs.  Anemia of chronic disease: H&H relatively stable. Recent Labs    07/24/20 2146 11/01/20 1622 03/20/21 2130 03/29/21 1807 03/29/21 1844 03/30/21 0600 03/31/21 0527 04/01/21 0543 04/02/21 0753  HGB 11.4* 10.8* 9.8* 9.9* 8.8* 9.7* 8.9* 9.1* 9.6*  -Recheck CBC in 1 to 2 weeks  Essential hypertension: Normotensive. -Continue Toprol-XL.  Anxiety and depression/dementia without behavioral disturbance -Continue home Seroquel, Celexa and Aricept  Class I obesity Body mass index is 34.74 kg/m.            Discharge Exam: Vitals:   04/02/21 0626 04/02/21 1323  BP: (!) 163/73 137/87  Pulse: (!) 58 61  Resp: 14 18  Temp: 97.6 F (36.4 C) 98.5 F (36.9 C)  SpO2: 97% 100%    GENERAL: No apparent distress.  Nontoxic. HEENT: MMM.  Vision and hearing grossly intact.  NECK: Supple.  No apparent JVD.  RESP: On RA.  No IWOB.  Fair aeration bilaterally. CVS:  RRR. Heart sounds normal.  ABD/GI/GU: Bowel sounds present. Soft. Non tender.  MSK/EXT:  Moves extremities. No apparent deformity. No edema.  SKIN: Pressure skin injury over BLE/heels/sacrum NEURO: Awake and alert.  Oriented x4 except date.  Poor insight.  No apparent focal neuro deficit other than generalized weakness PSYCH: Calm. Normal affect.   Discharge Instructions  Discharge Instructions     Diet general   Complete by: As directed    Discharge wound care:   Complete by: As directed  Place a size appropriate foam dressing over the sacral area affected by Moisture Associated Skin Damage. Change every 3 days and prn.  Place Xeroform gauze Kellie Simmering [phone]), over any weeping areas on the RLE, wrap with kerlix.  Place feet in a Prevalon heel lift  boot.   Increase activity slowly   Complete by: As directed       Allergies as of 04/02/2021       Reactions   Adhesive [tape] Itching   Endocet [oxycodone-acetaminophen] Itching   Latex Itching   Oxycodone-acetaminophen Itching        Medication List     STOP taking these medications    doxycycline 100 MG capsule Commonly known as: VIBRAMYCIN   FreeStyle Libre 14 Day Reader [employer] 14 Day Sensor Misc   haloperidol 1 MG tablet Commonly known as: HALDOL   melatonin 3 MG Tabs tablet   naproxen sodium 220 MG tablet Commonly known as: ALEVE   tiZANidine 2 MG tablet Commonly known as: ZANAFLEX   traMADol 50 MG tablet Commonly known as: ULTRAM       TAKE these medications    acetaminophen 500 MG tablet Commonly known as: TYLENOL Take 1 tablet (500 mg total) by mouth every 8 (eight) hours.   albuterol 108 (90 Base) MCG/ACT inhaler Commonly known as: Ventolin HFA INHALE 2 PUFFS INTO THE LUNGS TWICE DAILY AS NEEDED FOR WHEEZING OR SHORTNESS OF BREATH What changed:  how much to take how to take this when to take this reasons to take this additional instructions   Alcohol Wipes 70 % Pads Apply topically.   aspirin 81 MG tablet Take 1 tablet (81 mg total) by mouth daily.   B-D ULTRAFINE III SHORT PEN 31G X 8 MM Misc Generic drug: Insulin Pen Needle USE TO CHECK BLOOD SUGERS TWICE DAILY   cholecalciferol 25 MCG (1000 UNIT) tablet Commonly known as: VITAMIN D3 Take 1,000 Units by mouth 2 (two) times daily.   citalopram 10 MG tablet Commonly known as: CeleXA Take 1 tablet (10 mg total) by mouth daily.   donepezil 5 MG tablet Commonly known as: Aricept Take 0.5 tablets (2.5 mg total) by mouth at bedtime.   furosemide 40 MG tablet Commonly known as: LASIX TAKE 1 TABLET BY MOUTH EVERY DAY What changed:  how much to take how to take this when to take this additional instructions   glucose blood test strip Commonly known as: ONE  TOUCH TEST STRIPS Use as directed once daily to check blood sugar.  Diagnosis code E11.9   insulin aspart 100 UNIT/ML injection Commonly known as: novoLOG Inject 0-6 Units into the skin 4 (four) times daily - after meals and at bedtime. CBG < 70: Implement Hypoglycemia Standing Orders and refer to Hypoglycemia Standing Orders sidebar report CBG 70 - 120: 0 units CBG 121 - 150: 0 units CBG 151 - 200: 1 unit CBG 201-250: 2 units CBG 251-300: 3 units CBG 301-350: 4 units CBG 351-400: 5 units CBG > 400: Give 6 units and call MD   Lantus SoloStar 100 UNIT/ML Solostar Pen Generic drug: insulin glargine Inject 5 Units into the skin 2 (two) times daily. What changed:  how much to take when to take this   metoprolol succinate 100 MG 24 hr tablet Commonly known as: TOPROL-XL TAKE 1 TABLET BY MOUTH DAILY WITH OR IMMEDIATELY FOLLOWING A MEAL What changed:  how much to take how to take this when to take this additional instructions  ondansetron 4 MG disintegrating tablet Commonly known as: Zofran ODT 29m ODT q4 hours prn nausea/vomit What changed:  how much to take how to take this when to take this reasons to take this additional instructions   OBethelw/Device Kit Use as directed once daily to check blood sugar.  Diagnosis code 250.02   ONE TOUCH LANCETS Misc Use as directed once daily to check blood sugar. DX E11.09   ONE-A-DAY EXTRAS ANTIOXIDANT PO Take 1 tablet by mouth daily.   QUEtiapine 100 MG tablet Commonly known as: SEROQUEL Take 100 mg by mouth at bedtime.   rosuvastatin 20 MG tablet Commonly known as: CRESTOR TAKE 1/2 TABLET BY MOUTH EVERY DAY               Discharge Care Instructions  (From admission, onward)           Start     Ordered   04/02/21 0000  Discharge wound care:       Comments: Place a size appropriate foam dressing over the sacral area affected by Moisture Associated Skin Damage. Change every 3 days and prn.  Place  Xeroform gauze (Kellie Simmering#[phone], over any weeping areas on the RLE, wrap with kerlix.  Place feet in a Prevalon heel lift boot.   04/02/21 1346            Consultations: None  Procedures/Studies:   CT Head Wo Contrast  Result Date: 03/29/2021 CLINICAL DATA:  Altered mental status. EXAM: CT HEAD WITHOUT CONTRAST TECHNIQUE: Contiguous axial images were obtained from the base of the skull through the vertex without intravenous contrast. COMPARISON:  September 27, 2019 FINDINGS: Brain: There is mild cerebral atrophy with widening of the extra-axial spaces and ventricular dilatation. There are areas of decreased attenuation within the white matter tracts of the supratentorial brain, consistent with microvascular disease changes. Vascular: No hyperdense vessel or unexpected calcification. Skull: Normal. Negative for fracture or focal lesion. Sinuses/Orbits: Chronic diffuse calvarial thickening is seen with stable appearing mixed lytic and sclerotic areas. Other: None. IMPRESSION: 1. Generalized cerebral atrophy. 2. No acute intracranial abnormality. Electronically Signed   By: TVirgina NorfolkM.D.   On: 03/29/2021 19:06   CT CHEST WO CONTRAST  Result Date: 03/29/2021 CLINICAL DATA:  Concern for infiltrate. EXAM: CT CHEST WITHOUT CONTRAST TECHNIQUE: Multidetector CT imaging of the chest was performed following the standard protocol without IV contrast. COMPARISON:  Chest radiograph dated 03/29/2021. FINDINGS: Evaluation of this exam is limited in the absence of intravenous contrast. Cardiovascular: Mild cardiomegaly. No pericardial effusion. There is a three-vessel coronary vascular calcification. Mild atherosclerotic calcification of the thoracic aorta. No aneurysmal dilatation. The central pulmonary arteries are grossly unremarkable. Mediastinum/Nodes: No hilar or mediastinal adenopathy. Evaluation however is limited in the absence of intravenous contrast. The esophagus and the thyroid gland are  grossly unremarkable. No mediastinal fluid collection. Lungs/Pleura: Bilateral lower lobe linear atelectasis/scarring. Focal consolidation, pleural effusion, or pneumothorax. The central airways are patent. Upper Abdomen: Cholecystectomy. Musculoskeletal: Median sternotomy wires. Degenerative changes of the spine. No acute osseous pathology. IMPRESSION: 1. No acute intrathoracic pathology. 2. Mild cardiomegaly with three-vessel coronary vascular calcification. 3. Aortic Atherosclerosis (ICD10-I70.0). Electronically Signed   By: AAnner CreteM.D.   On: 03/29/2021 22:39   DG Chest Portable 1 View  Result Date: 03/29/2021 CLINICAL DATA:  Hypoglycemia. EXAM: PORTABLE CHEST 1 VIEW COMPARISON:  September 27, 2019 FINDINGS: Multiple sternal wires are seen. Low lung volumes are noted. Mild areas of atelectasis and/or infiltrate are  seen along the infrahilar regions, bilaterally. There is no evidence of a pleural effusion or pneumothorax. The heart size and mediastinal contours are within normal limits. Multilevel degenerative changes seen throughout the thoracic spine. IMPRESSION: 1. Evidence of prior median sternotomy. 2. Mild bilateral infrahilar atelectasis and/or infiltrate. Electronically Signed   By: Virgina Norfolk M.D.   On: 03/29/2021 18:44   DG Tibia/Fibula Left Port  Result Date: 03/20/2021 CLINICAL DATA:  Abscess or cellulitis. Redness and swelling about left ankle and distal lower leg. EXAM: PORTABLE LEFT TIBIA AND FIBULA - 2 VIEW COMPARISON:  None. FINDINGS: Equivocal decreased density involving the medial malleolus versus positioning. There is no frank bony destruction or periosteal reaction. Chronic deformity of the lateral tibiofemoral compartment of the knee. No evidence of acute fracture there is generalized soft tissue edema of the lower leg. No soft tissue air or radiopaque foreign body. IMPRESSION: 1. Generalized soft tissue edema of the lower leg. No soft tissue air or radiopaque foreign  body. 2. Equivocal decreased density involving the medial malleolus versus positioning. This would be better assessed on dedicated ankle radiograph, and may be due to positioning. 3. Chronic changes of the knee. Electronically Signed   By: Keith Rake M.D.   On: 03/20/2021 20:21       The results of significant diagnostics from this hospitalization (including imaging, microbiology, ancillary and laboratory) are listed below for reference.     Microbiology: Recent Results (from the past 240 hour(s))  Resp Panel by RT-PCR (Flu A&B, Covid) Nasopharyngeal Swab     Status: None   Collection Time: 03/29/21  6:35 PM   Specimen: Nasopharyngeal Swab; Nasopharyngeal(NP) swabs in vial transport medium  Result Value Ref Range Status   SARS Coronavirus 2 by RT PCR NEGATIVE NEGATIVE Final    Comment: (NOTE) SARS-CoV-2 target nucleic acids are NOT DETECTED.  The SARS-CoV-2 RNA is generally detectable in upper respiratory specimens during the acute phase of infection. The lowest concentration of SARS-CoV-2 viral copies this assay can detect is 138 copies/mL. A negative result does not preclude SARS-Cov-2 infection and should not be used as the sole basis for treatment or other patient management decisions. A negative result may occur with  improper specimen collection/handling, submission of specimen other than nasopharyngeal swab, presence of viral mutation(s) within the areas targeted by this assay, and inadequate number of viral copies(<138 copies/mL). A negative result must be combined with clinical observations, patient history, and epidemiological information. The expected result is Negative.  Fact Sheet for Patients:  EntrepreneurPulse.com.au  Fact Sheet for Healthcare Providers:  IncredibleEmployment.be  This test is no t yet approved or cleared by the [place] FDA and  has been authorized for detection and/or diagnosis of SARS-CoV-2 by FDA  under an Emergency Use Authorization (EUA). This EUA will remain  in effect (meaning this test can be used) for the duration of the COVID-19 declaration under Section 564(b)(1) of the Act, 21 U.S.C.section 360bbb-3(b)(1), unless the authorization is terminated  or revoked sooner.       Influenza A by PCR NEGATIVE NEGATIVE Final   Influenza B by PCR NEGATIVE NEGATIVE Final    Comment: (NOTE) The Xpert Xpress SARS-CoV-2/FLU/RSV plus assay is intended as an aid in the diagnosis of influenza from Nasopharyngeal swab specimens and should not be used as a sole basis for treatment. Nasal washings and aspirates are unacceptable for Xpert Xpress SARS-CoV-2/FLU/RSV testing.  Fact Sheet for Patients: EntrepreneurPulse.com.au  Fact Sheet for Healthcare Providers: IncredibleEmployment.be  This test is  not yet approved or cleared by the [place] and has been authorized for detection and/or diagnosis of SARS-CoV-2 by FDA under an Emergency Use Authorization (EUA). This EUA will remain in effect (meaning this test can be used) for the duration of the COVID-19 declaration under Section 564(b)(1) of the Act, 21 U.S.C. section 360bbb-3(b)(1), unless the authorization is terminated or revoked.  Performed at [facility], [address] [place], [place] [address]      Labs:  CBC: Recent Labs  Lab 03/29/21 1807 03/29/21 1844 03/30/21 0600 03/31/21 0527 04/01/21 0543 04/02/21 0753  WBC 9.6  --  8.2 7.1 6.9 7.2  NEUTROABS 5.3  --   --  2.8 3.0 3.1  HGB 9.9* 8.8* 9.7* 8.9* 9.1* 9.6*  HCT 31.4* 26.0* 31.7* 28.1* 27.6* 30.1*  MCV 93.7  --  96.1 93.0 90.8 90.4  PLT 248  --  211 207 207 214   BMP &GFR Recent Labs  Lab 03/29/21 1807 03/29/21 1844 03/30/21 0600 03/31/21 0527 04/01/21 0543 04/02/21 0753  NA 143 145 141 146* 143 145  K 3.5 3.6 3.8 3.5 4.3 4.4  CL 107 107 109 111 110 109  CO2 27  --  23 30 28 28   GLUCOSE  29* 117* 241* 75 126* 151*  BUN 47* 40* 43* 39* 29* 27*  CREATININE 1.98* 2.00* 1.72* 1.40* 1.20* 1.13*  CALCIUM 9.4  --  9.3 9.6 9.7 9.6  MG  --   --   --  1.6* 1.8 1.9  PHOS  --   --   --  2.7 2.1* 3.2   Estimated Creatinine Clearance: 38.9 mL/min (A) (by C-G formula based on SCr of 1.13 mg/dL (H)). Liver & Pancreas: Recent Labs  Lab 03/29/21 1807 03/31/21 0527 04/01/21 0543 04/02/21 0753  AST 24 18 19 21   ALT 17 16 15 17   ALKPHOS 65 55 64 68  BILITOT 0.9 0.6 0.6 0.6  PROT 7.5 6.7 6.8 6.9  ALBUMIN 3.1* 2.7* 2.7* 2.7*   Recent Labs  Lab 03/29/21 1807  LIPASE 21   No results for input(s): AMMONIA in the last 168 hours. Diabetic: No results for input(s): HGBA1C in the last 72 hours. Recent Labs  Lab 04/01/21 1158 04/01/21 1705 04/01/21 2201 04/02/21 0742 04/02/21 1154  GLUCAP 137* 200* 199* 151* 242*   Cardiac Enzymes: No results for input(s): CKTOTAL, CKMB, CKMBINDEX, TROPONINI in the last 168 hours. No results for input(s): PROBNP in the last 8760 hours. Coagulation Profile: No results for input(s): INR, PROTIME in the last 168 hours. Thyroid Function Tests: No results for input(s): TSH, T4TOTAL, FREET4, T3FREE, THYROIDAB in the last 72 hours. Lipid Profile: No results for input(s): CHOL, HDL, LDLCALC, TRIG, CHOLHDL, LDLDIRECT in the last 72 hours. Anemia Panel: No results for input(s): VITAMINB12, FOLATE, FERRITIN, TIBC, IRON, RETICCTPCT in the last 72 hours. Urine analysis:    Component Value Date/Time   COLORURINE YELLOW 03/30/2021 0747   APPEARANCEUR HAZY (A) 03/30/2021 0747   LABSPEC 1.014 03/30/2021 0747   PHURINE 5.0 03/30/2021 0747   GLUCOSEU NEGATIVE 03/30/2021 0747   GLUCOSEU NEGATIVE 01/22/2020 1620   HGBUR NEGATIVE 03/30/2021 [place] 03/30/2021 0747   [place] 03/30/2021 0747   PROTEINUR NEGATIVE 03/30/2021 0747   UROBILINOGEN 0.2 01/22/2020 1620   NITRITE NEGATIVE 03/30/2021 0747   LEUKOCYTESUR SMALL (A)  03/30/2021 0747   Sepsis Labs: Invalid input(s): PROCALCITONIN, LACTICIDVEN   Time coordinating discharge: 45 minutes  SIGNED:  Bretta Bang  Bettina Gavia, MD  [facility] 04/02/2021, 1:57 PM  If 7PM-7AM, please contact night-coverage [email or web address]

## 2021-04-02 NOTE — TOC Progression Note (Addendum)
Transition of Care [facility]) - Progression Note    Patient Details  Name: Holly Weiss MRN: [id number] Date of Birth: 1941/06/10  [facility] CM/SW Contact  Galit Urich, Marjie Skiff, RN Phone Number: 04/02/2021, 12:09 PM  Clinical Narrative:    Addendum:  Insurance auth received for SNF [phone]. Daughter [name] contacted to inform pt would go to [place] today. [facility] will be contacted for transport and RN to call report to [phone].  Spoke with daughter [name] to offer SNF bed choice. [name] chose [facility]. [place] liaison contacted to accept bed. Insurance auth started with [employer].    Expected Discharge Plan: Skilled Nursing Facility Barriers to Discharge: Continued Medical Work up  Expected Discharge Plan and Services Expected Discharge Plan: [place]   Discharge Planning Services: CM Consult     Readmission Risk Interventions Readmission Risk Prevention Plan 04/01/2021  Transportation Screening Complete  PCP or Specialist Appt within 5-7 Days Complete  Home Care Screening Complete  Medication Review (RN CM) Complete  Some recent data might be hidden

## 2021-04-02 NOTE — Progress Notes (Signed)
Report given to [facility] at [facility].

## 2021-04-03 DIAGNOSIS — G629 Polyneuropathy, unspecified: Secondary | ICD-10-CM | POA: Diagnosis not present

## 2021-04-03 DIAGNOSIS — R3915 Urgency of urination: Secondary | ICD-10-CM | POA: Diagnosis not present

## 2021-04-03 DIAGNOSIS — L89302 Pressure ulcer of unspecified buttock, stage 2: Secondary | ICD-10-CM | POA: Diagnosis not present

## 2021-04-03 DIAGNOSIS — I152 Hypertension secondary to endocrine disorders: Secondary | ICD-10-CM | POA: Diagnosis not present

## 2021-04-03 DIAGNOSIS — D649 Anemia, unspecified: Secondary | ICD-10-CM | POA: Diagnosis not present

## 2021-04-03 DIAGNOSIS — N1831 Chronic kidney disease, stage 3a: Secondary | ICD-10-CM | POA: Diagnosis not present

## 2021-04-03 DIAGNOSIS — I10 Essential (primary) hypertension: Secondary | ICD-10-CM | POA: Diagnosis not present

## 2021-04-03 DIAGNOSIS — E785 Hyperlipidemia, unspecified: Secondary | ICD-10-CM | POA: Diagnosis not present

## 2021-04-03 DIAGNOSIS — G9340 Encephalopathy, unspecified: Secondary | ICD-10-CM | POA: Diagnosis not present

## 2021-04-03 DIAGNOSIS — M4804 Spinal stenosis, thoracic region: Secondary | ICD-10-CM | POA: Diagnosis not present

## 2021-04-03 DIAGNOSIS — L89621 Pressure ulcer of left heel, stage 1: Secondary | ICD-10-CM | POA: Diagnosis not present

## 2021-04-03 DIAGNOSIS — Z794 Long term (current) use of insulin: Secondary | ICD-10-CM | POA: Diagnosis not present

## 2021-04-03 DIAGNOSIS — E119 Type 2 diabetes mellitus without complications: Secondary | ICD-10-CM | POA: Diagnosis not present

## 2021-04-03 DIAGNOSIS — Z743 Need for continuous supervision: Secondary | ICD-10-CM | POA: Diagnosis not present

## 2021-04-03 DIAGNOSIS — R739 Hyperglycemia, unspecified: Secondary | ICD-10-CM | POA: Diagnosis not present

## 2021-04-03 DIAGNOSIS — E11319 Type 2 diabetes mellitus with unspecified diabetic retinopathy without macular edema: Secondary | ICD-10-CM | POA: Diagnosis not present

## 2021-04-03 DIAGNOSIS — G894 Chronic pain syndrome: Secondary | ICD-10-CM | POA: Diagnosis not present

## 2021-04-03 DIAGNOSIS — E162 Hypoglycemia, unspecified: Secondary | ICD-10-CM | POA: Diagnosis not present

## 2021-04-03 DIAGNOSIS — K219 Gastro-esophageal reflux disease without esophagitis: Secondary | ICD-10-CM | POA: Diagnosis not present

## 2021-04-03 DIAGNOSIS — M1380 Other specified arthritis, unspecified site: Secondary | ICD-10-CM | POA: Diagnosis not present

## 2021-04-03 DIAGNOSIS — N183 Chronic kidney disease, stage 3 unspecified: Secondary | ICD-10-CM | POA: Diagnosis not present

## 2021-04-03 DIAGNOSIS — E11649 Type 2 diabetes mellitus with hypoglycemia without coma: Secondary | ICD-10-CM | POA: Diagnosis not present

## 2021-04-03 DIAGNOSIS — G9341 Metabolic encephalopathy: Secondary | ICD-10-CM | POA: Diagnosis not present

## 2021-04-03 DIAGNOSIS — G4720 Circadian rhythm sleep disorder, unspecified type: Secondary | ICD-10-CM | POA: Diagnosis not present

## 2021-04-03 DIAGNOSIS — J45901 Unspecified asthma with (acute) exacerbation: Secondary | ICD-10-CM | POA: Diagnosis not present

## 2021-04-03 DIAGNOSIS — E1159 Type 2 diabetes mellitus with other circulatory complications: Secondary | ICD-10-CM | POA: Diagnosis not present

## 2021-04-03 DIAGNOSIS — H409 Unspecified glaucoma: Secondary | ICD-10-CM | POA: Diagnosis not present

## 2021-04-11 DIAGNOSIS — E1159 Type 2 diabetes mellitus with other circulatory complications: Secondary | ICD-10-CM | POA: Diagnosis not present

## 2021-04-11 DIAGNOSIS — N183 Chronic kidney disease, stage 3 unspecified: Secondary | ICD-10-CM | POA: Diagnosis not present

## 2021-04-11 DIAGNOSIS — D649 Anemia, unspecified: Secondary | ICD-10-CM | POA: Diagnosis not present

## 2021-04-11 DIAGNOSIS — L89302 Pressure ulcer of unspecified buttock, stage 2: Secondary | ICD-10-CM | POA: Diagnosis not present

## 2021-04-11 DIAGNOSIS — L89621 Pressure ulcer of left heel, stage 1: Secondary | ICD-10-CM | POA: Diagnosis not present

## 2021-04-11 DIAGNOSIS — E11649 Type 2 diabetes mellitus with hypoglycemia without coma: Secondary | ICD-10-CM | POA: Diagnosis not present

## 2021-04-11 DIAGNOSIS — Z794 Long term (current) use of insulin: Secondary | ICD-10-CM | POA: Diagnosis not present

## 2021-04-11 DIAGNOSIS — G9341 Metabolic encephalopathy: Secondary | ICD-10-CM | POA: Diagnosis not present

## 2021-04-11 DIAGNOSIS — G4720 Circadian rhythm sleep disorder, unspecified type: Secondary | ICD-10-CM | POA: Diagnosis not present

## 2021-04-11 DIAGNOSIS — I152 Hypertension secondary to endocrine disorders: Secondary | ICD-10-CM | POA: Diagnosis not present

## 2021-04-14 DIAGNOSIS — G629 Polyneuropathy, unspecified: Secondary | ICD-10-CM | POA: Diagnosis not present

## 2021-04-18 DIAGNOSIS — Z794 Long term (current) use of insulin: Secondary | ICD-10-CM | POA: Diagnosis not present

## 2021-04-18 DIAGNOSIS — I152 Hypertension secondary to endocrine disorders: Secondary | ICD-10-CM | POA: Diagnosis not present

## 2021-04-18 DIAGNOSIS — G629 Polyneuropathy, unspecified: Secondary | ICD-10-CM | POA: Diagnosis not present

## 2021-04-18 DIAGNOSIS — E1159 Type 2 diabetes mellitus with other circulatory complications: Secondary | ICD-10-CM | POA: Diagnosis not present

## 2021-04-18 DIAGNOSIS — N183 Chronic kidney disease, stage 3 unspecified: Secondary | ICD-10-CM | POA: Diagnosis not present

## 2021-04-21 ENCOUNTER — Ambulatory Visit: Payer: Medicare Other | Admitting: Endocrinology

## 2021-04-21 DIAGNOSIS — M1380 Other specified arthritis, unspecified site: Secondary | ICD-10-CM | POA: Diagnosis not present

## 2021-04-21 DIAGNOSIS — G9340 Encephalopathy, unspecified: Secondary | ICD-10-CM | POA: Diagnosis not present

## 2021-04-21 DIAGNOSIS — R2681 Unsteadiness on feet: Secondary | ICD-10-CM | POA: Diagnosis not present

## 2021-04-21 DIAGNOSIS — M6281 Muscle weakness (generalized): Secondary | ICD-10-CM | POA: Diagnosis not present

## 2021-04-22 DIAGNOSIS — E11319 Type 2 diabetes mellitus with unspecified diabetic retinopathy without macular edema: Secondary | ICD-10-CM | POA: Diagnosis not present

## 2021-04-22 DIAGNOSIS — G9340 Encephalopathy, unspecified: Secondary | ICD-10-CM | POA: Diagnosis not present

## 2021-04-22 DIAGNOSIS — D649 Anemia, unspecified: Secondary | ICD-10-CM | POA: Diagnosis not present

## 2021-04-22 DIAGNOSIS — K219 Gastro-esophageal reflux disease without esophagitis: Secondary | ICD-10-CM | POA: Diagnosis not present

## 2021-04-28 ENCOUNTER — Other Ambulatory Visit: Payer: Self-pay

## 2021-04-29 ENCOUNTER — Encounter: Payer: Self-pay | Admitting: Internal Medicine

## 2021-04-29 ENCOUNTER — Ambulatory Visit (INDEPENDENT_AMBULATORY_CARE_PROVIDER_SITE_OTHER): Payer: Medicare Other | Admitting: Internal Medicine

## 2021-04-29 VITALS — BP 130/80 | HR 60 | Temp 97.9°F | Resp 18 | Ht 61.0 in

## 2021-04-29 DIAGNOSIS — I10 Essential (primary) hypertension: Secondary | ICD-10-CM

## 2021-04-29 DIAGNOSIS — E538 Deficiency of other specified B group vitamins: Secondary | ICD-10-CM | POA: Diagnosis not present

## 2021-04-29 DIAGNOSIS — N1832 Chronic kidney disease, stage 3b: Secondary | ICD-10-CM | POA: Diagnosis not present

## 2021-04-29 DIAGNOSIS — I5042 Chronic combined systolic (congestive) and diastolic (congestive) heart failure: Secondary | ICD-10-CM

## 2021-04-29 DIAGNOSIS — E559 Vitamin D deficiency, unspecified: Secondary | ICD-10-CM | POA: Diagnosis not present

## 2021-04-29 DIAGNOSIS — F0392 Unspecified dementia, unspecified severity, with psychotic disturbance: Secondary | ICD-10-CM

## 2021-04-29 DIAGNOSIS — R269 Unspecified abnormalities of gait and mobility: Secondary | ICD-10-CM

## 2021-04-29 DIAGNOSIS — E1165 Type 2 diabetes mellitus with hyperglycemia: Secondary | ICD-10-CM | POA: Diagnosis not present

## 2021-04-29 DIAGNOSIS — S81809A Unspecified open wound, unspecified lower leg, initial encounter: Secondary | ICD-10-CM | POA: Diagnosis not present

## 2021-04-29 DIAGNOSIS — F0391 Unspecified dementia with behavioral disturbance: Secondary | ICD-10-CM

## 2021-04-29 NOTE — Patient Instructions (Signed)
The glucometer and supplies will be sent to the pharmacy  You will be contacted regarding the referral for: [place] with nurse, PT and wound care ([facility])  Please continue all other medications as before, and refills have been done if requested.  Please have the pharmacy call with any other refills you may need.  Please continue your efforts at being more active, low cholesterol diet, and weight control.  Please keep your appointments with your specialists as you may have planned  Please go to the LAB at the blood drawing area for the tests to be done  You will be contacted by phone if any changes need to be made immediately.  Otherwise, you will receive a letter about your results with an explanation, but please check with MyChart first.  Please remember to sign up for MyChart if you have not done so, as this will be important to you in the future with finding out test results, communicating by private email, and scheduling acute appointments online when needed.  Please make an Appointment to return in 3 months, or sooner if needed

## 2021-04-30 MED ORDER — ACCU-CHEK GUIDE W/DEVICE KIT: PACK | 0 refills | Status: AC

## 2021-04-30 MED ORDER — LANCET DEVICE MISC: 12 refills | Status: AC

## 2021-04-30 MED ORDER — ACCU-CHEK GUIDE VI STRP: ORAL_STRIP | 12 refills | Status: AC

## 2021-04-30 NOTE — Assessment & Plan Note (Signed)
Will need HH with wound care

## 2021-04-30 NOTE — Assessment & Plan Note (Signed)
Stable, to continue current med tx,  to f/u any worsening symptoms or concerns

## 2021-04-30 NOTE — Assessment & Plan Note (Signed)
Lab Results  Component Value Date   CREATININE 1.13 (H) 04/02/2021   Stable overall, cont to avoid nephrotoxins

## 2021-04-30 NOTE — Assessment & Plan Note (Signed)
[place] for PT wit [facility]

## 2021-04-30 NOTE — Assessment & Plan Note (Signed)
Overall stable, to continue current med tx, f/u cardiology as planned

## 2021-04-30 NOTE — Assessment & Plan Note (Signed)
BP Readings from Last 3 Encounters:  04/29/21 130/80  04/03/21 (!) 144/61  03/20/21 (!) 167/66   Stable, pt to continue medical treatment [place]

## 2021-04-30 NOTE — Progress Notes (Signed)
Patient ID: Holly Weiss, female   DOB: February 04, 1941, 79 y.o.   MRN: [id number]        Chief Complaint: follow up HTN, HLD and DM, wound care       HPI:  Holly Weiss is a 79 y.o. female here with daughter c/o recent hospn aug 27 - 31 with AMS though due to low sugars and sedating meds.  Pt has had declining renal fxn gradual; meds renal adjusted, mental status improved, CBGs stablized.  Pt seen per pt and remains with debility in wheelchair after recent 20 day ST rehab stay.  Pt now at home with daughter who has moved in with her.  Pt has wheelchair, hospital bed and hoyer lift.  Not yet seen per [facility] RN or PT.  P has known left buttock and left medial ankle and now new right anterior ankle wounds without fever, chills, swelling redness or drainage.   Pt denies chest pain, increased sob or doe, wheezing, orthopnea, PND, increased LE swelling, palpitations, dizziness or syncope.   Pt denies polydipsia, polyuria, or new focal neuro s/s.  Needs glucometer and supplies but not sure which brand covered  Dementia overall stable symptomatically, and not assoc with behavioral changes such as hallucinations, paranoia, or agitation.       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/18/2010   ASTHMA, WITH ACUTE EXACERBATION 11/29/2008   Bilateral carpal tunnel syndrome    Chronic pain syndrome 01/21/2010   Dementia ([place])    Depression    DIABETIC  RETINOPATHY 11/91/4782   DM, UNCOMPLICATED, TYPE II, UNCONTROLLED 05/30/2007   Edema of both legs    takes Lasix   Femur fracture, right (HCC)    GERD (gastroesophageal reflux disease)    GLAUCOMA NOS 05/30/2007   History of blood transfusion    spine surgery    History of kidney stones    HYPERLIPIDEMIA 05/30/2007   HYPERTENSION 10/31/2007   LOW BACK PAIN 01/18/2008   Migraine headache    MORBID OBESITY, HX OF 05/30/2007   NEPHROLITHIASIS, HX OF 01/18/2008   Numbness and tingling in hands    Seasonal allergies    Shortness of breath    with walking   SHOULDER PAIN, BILATERAL 04/09/2009   Spinal stenosis    Urgency of urination    Past Surgical History:  Procedure Laterality Date   BACK SURGERY     CATARACT EXTRACTION     CHOLECYSTECTOMY     lumbar disc surgury     ORIF TIBIA FRACTURE Right 01/16/2014   Procedure: RIGHT TIBIA REPAIR NON-UNION/MALUNION TIBIA WITH SLIDING GRAFT;  Surgeon: Renette Butters, MD;  Location: [facility];  Service: Orthopedics;  Laterality: Right;   THORACIC FUSION     TOTAL KNEE ARTHROPLASTY WITH REVISION COMPONENTS Right 07/12/2013   Procedure: TOTAL KNEE ARTHROPLASTY WITH TIBIA REVISION COMPONENTS;  Surgeon: Ninetta Lights, MD;  Location: [facility];  Service: Orthopedics;  Laterality: Right;   TUBAL LIGATION      reports that she has never smoked. She uses smokeless tobacco. She reports that she does not drink alcohol and does not  use drugs. family history includes Dementia in her mother; Depression in her sister; Diabetes in her mother and sister; Heart disease in her father and mother; Lung cancer in her father. Allergies  Allergen Reactions   Adhesive [Tape] Itching   Endocet [Oxycodone-Acetaminophen] Itching   Latex Itching   Oxycodone-Acetaminophen Itching   Current Outpatient Medications on File Prior to Visit  Medication Sig Dispense Refill   aspirin 81 MG tablet Take 1 tablet (81 mg total) by mouth daily. 30 tablet 0   cholecalciferol (VITAMIN D3) 25 MCG (1000 UNIT) tablet Take 1,000 Units by mouth 2 (two) times daily.     furosemide (LASIX) 40 MG tablet TAKE 1 TABLET BY MOUTH EVERY DAY (Patient taking differently: Take 40 mg by mouth daily.) 90 tablet 3   metoprolol succinate (TOPROL-XL) 100 MG 24 hr tablet  TAKE 1 TABLET BY MOUTH DAILY WITH OR IMMEDIATELY FOLLOWING A MEAL (Patient taking differently: Take 100 mg by mouth daily.) 90 tablet 3   Multiple Vitamins-Minerals (ONE-A-DAY EXTRAS ANTIOXIDANT PO) Take 1 tablet by mouth daily.     ondansetron (ZOFRAN ODT) 4 MG disintegrating tablet 4mg  ODT q4 hours prn nausea/vomit (Patient taking differently: Take 4 mg by mouth every 8 (eight) hours as needed for nausea or vomiting.) 10 tablet 0   QUEtiapine (SEROQUEL) 100 MG tablet Take 100 mg by mouth at bedtime.     rosuvastatin (CRESTOR) 20 MG tablet TAKE 1/2 TABLET BY MOUTH EVERY DAY 90 tablet 1   traMADol (ULTRAM) 50 MG tablet Take 50 mg by mouth at bedtime.     albuterol (VENTOLIN HFA) 108 (90 Base) MCG/ACT inhaler INHALE 2 PUFFS INTO THE LUNGS TWICE DAILY AS NEEDED FOR WHEEZING OR SHORTNESS OF BREATH (Patient not taking: Reported on 04/29/2021) 18 g 3   Alcohol Swabs (ALCOHOL WIPES) 70 % PADS Apply topically. (Patient not taking: Reported on 04/29/2021)     citalopram (CELEXA) 10 MG tablet Take 1 tablet (10 mg total) by mouth daily. (Patient not taking: Reported on 04/29/2021) 90 tablet 3   donepezil (ARICEPT) 5 MG tablet Take 0.5 tablets (2.5 mg total) by mouth at bedtime. (Patient not taking: Reported on 04/29/2021)     glucose blood (ONE TOUCH TEST STRIPS) test strip Use as directed once daily to check blood sugar.  Diagnosis code E11.9 (Patient not taking: Reported on 04/29/2021) 100 each 11   insulin aspart (NOVOLOG) 100 UNIT/ML injection Inject 0-6 Units into the skin 4 (four) times daily - after meals and at bedtime. CBG < 70: Implement Hypoglycemia Standing Orders and refer to Hypoglycemia Standing Orders sidebar report CBG 70 - 120: 0 units CBG 121 - 150: 0 units CBG 151 - 200: 1 unit CBG 201-250: 2 units CBG 251-300: 3 units CBG 301-350: 4 units CBG 351-400: 5 units CBG > 400: Give 6 units and call MD (Patient not taking: Reported on 04/29/2021) 10 mL 11   insulin glargine (LANTUS SOLOSTAR) 100 UNIT/ML  Solostar Pen Inject 5 Units into the skin 2 (two) times daily. (Patient not taking: Reported on 04/29/2021) 15 mL 11   Insulin Pen Needle (B-D ULTRAFINE III SHORT PEN) 31G X 8 MM MISC USE TO CHECK BLOOD SUGERS TWICE DAILY (Patient not taking: Reported on 04/29/2021) 100 each 3   ONE TOUCH LANCETS MISC Use as directed once daily to check blood sugar. DX E11.09 (Patient not taking: Reported on 04/29/2021) 100 each 3   No current facility-administered medications on file prior to visit.  ROS:  All others reviewed and negative.  Objective        PE:  BP 130/80   Pulse 60   Temp 97.9 F (36.6 C) (Oral)   Resp 18   Ht 5\' 1"  (1.549 m)   SpO2 96%   BMI 34.74 kg/m                 Constitutional: Pt appears in NAD               HENT: Head: NCAT.                Right Ear: External ear normal.                 Left Ear: External ear normal.                Eyes: . Pupils are equal, round, and reactive to light. Conjunctivae and EOM are normal               Nose: without d/c or deformity               Neck: Neck supple. Gross normal ROM               Cardiovascular: Normal rate and regular rhythm.                 Pulmonary/Chest: Effort normal and breath sounds without rales or wheezing.                Abd:  Soft, NT, ND, + BS, no organomegaly               Neurological: Pt is alert. At baseline orientation, motor grossly intact               Skin:  LE edema - none; has left medial ankle, and right anterior ankle wounds without drainage               Psychiatric: Pt behavior is normal without agitation   Micro: none  Cardiac tracings I have personally interpreted today:  none  Pertinent Radiological findings (summarize): none   Lab Results  Component Value Date   WBC 7.2 04/02/2021   HGB 9.6 (L) 04/02/2021   HCT 30.1 (L) 04/02/2021   PLT 214 04/02/2021   GLUCOSE 151 (H) 04/02/2021   CHOL 144 11/01/2020   TRIG 96 11/01/2020   HDL 39 (L) 11/01/2020   LDLCALC 86 11/01/2020   ALT  17 04/02/2021   AST 21 04/02/2021   NA 145 04/02/2021   K 4.4 04/02/2021   CL 109 04/02/2021   CREATININE 1.13 (H) 04/02/2021   BUN 27 (H) 04/02/2021   CO2 28 04/02/2021   TSH 1.19 11/01/2020   INR 1.11 01/15/2014   HGBA1C 7.6 (H) 03/30/2021   MICROALBUR 4.6 11/01/2020   Assessment/Plan:  Holly Weiss is a 80 y.o. Black or African American [2] female with  has a past medical history of ANEMIA-NOS (01/18/2008), Arthritis, ARTHRITIS, GENERALIZED (08/18/2010), ASTHMA, WITH ACUTE EXACERBATION (11/29/2008), Bilateral carpal tunnel syndrome, Chronic pain syndrome (01/21/2010), Dementia ([place]), Depression, DIABETIC  RETINOPATHY (62/37/6283), DM, UNCOMPLICATED, TYPE II, UNCONTROLLED (05/30/2007), Edema of both legs, Femur fracture, right ([place]), GERD (gastroesophageal reflux disease), GLAUCOMA NOS (05/30/2007), History of blood transfusion, History of kidney stones, HYPERLIPIDEMIA (05/30/2007), HYPERTENSION (10/31/2007), LOW BACK PAIN (01/18/2008), Migraine headache, MORBID OBESITY, HX OF (05/30/2007), NEPHROLITHIASIS, HX OF (01/18/2008), Numbness and tingling in hands, Seasonal allergies, Shortness of breath, SHOULDER PAIN, BILATERAL (04/09/2009),  Spinal stenosis, and Urgency of urination.  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[place]) Stable, to continue current med tx,  to f/u any worsening symptoms or concerns   Essential hypertension BP Readings from Last 3 Encounters:  04/29/21 130/80  04/03/21 (!) 144/61  03/20/21 (!) 167/66   Stable, pt to continue medical treatment toprol   Multiple open wounds of lower leg Will need HH with wound care   CKD (chronic kidney disease), stage III ([place]) Lab Results  Component Value Date   CREATININE 1.13 (H) 04/02/2021   Stable overall, cont to avoid nephrotoxins   Gait disorder Ok for PT wit HH  Diabetes type 2, uncontrolled  ([place]) Lab Results  Component Value Date   HGBA1C 7.6 (H) 03/30/2021   Stable, pt to continue current medical treatment novo;og, lantus; will need glucometer and supplies  Followup: No follow-ups on file.  Cathlean Cower, MD 04/30/2021 7:40 AM [facility]

## 2021-04-30 NOTE — Assessment & Plan Note (Signed)
Lab Results  Component Value Date   HGBA1C 7.6 (H) 03/30/2021   Stable, pt to continue current medical treatment novo;og, lantus; will need glucometer and supplies

## 2021-05-01 DIAGNOSIS — G4720 Circadian rhythm sleep disorder, unspecified type: Secondary | ICD-10-CM | POA: Diagnosis not present

## 2021-05-01 DIAGNOSIS — E1122 Type 2 diabetes mellitus with diabetic chronic kidney disease: Secondary | ICD-10-CM | POA: Diagnosis not present

## 2021-05-01 DIAGNOSIS — M4804 Spinal stenosis, thoracic region: Secondary | ICD-10-CM | POA: Diagnosis not present

## 2021-05-01 DIAGNOSIS — I130 Hypertensive heart and chronic kidney disease with heart failure and stage 1 through stage 4 chronic kidney disease, or unspecified chronic kidney disease: Secondary | ICD-10-CM | POA: Diagnosis not present

## 2021-05-01 DIAGNOSIS — L89323 Pressure ulcer of left buttock, stage 3: Secondary | ICD-10-CM | POA: Diagnosis not present

## 2021-05-01 DIAGNOSIS — H409 Unspecified glaucoma: Secondary | ICD-10-CM | POA: Diagnosis not present

## 2021-05-01 DIAGNOSIS — G894 Chronic pain syndrome: Secondary | ICD-10-CM | POA: Diagnosis not present

## 2021-05-01 DIAGNOSIS — I700 Atherosclerosis of aorta: Secondary | ICD-10-CM | POA: Diagnosis not present

## 2021-05-01 DIAGNOSIS — G5603 Carpal tunnel syndrome, bilateral upper limbs: Secondary | ICD-10-CM | POA: Diagnosis not present

## 2021-05-01 DIAGNOSIS — I872 Venous insufficiency (chronic) (peripheral): Secondary | ICD-10-CM | POA: Diagnosis not present

## 2021-05-01 DIAGNOSIS — J45909 Unspecified asthma, uncomplicated: Secondary | ICD-10-CM | POA: Diagnosis not present

## 2021-05-01 DIAGNOSIS — S81801D Unspecified open wound, right lower leg, subsequent encounter: Secondary | ICD-10-CM | POA: Diagnosis not present

## 2021-05-01 DIAGNOSIS — D631 Anemia in chronic kidney disease: Secondary | ICD-10-CM | POA: Diagnosis not present

## 2021-05-01 DIAGNOSIS — I2510 Atherosclerotic heart disease of native coronary artery without angina pectoris: Secondary | ICD-10-CM | POA: Diagnosis not present

## 2021-05-01 DIAGNOSIS — N1832 Chronic kidney disease, stage 3b: Secondary | ICD-10-CM | POA: Diagnosis not present

## 2021-05-01 DIAGNOSIS — L89313 Pressure ulcer of right buttock, stage 3: Secondary | ICD-10-CM | POA: Diagnosis not present

## 2021-05-01 DIAGNOSIS — L97821 Non-pressure chronic ulcer of other part of left lower leg limited to breakdown of skin: Secondary | ICD-10-CM | POA: Diagnosis not present

## 2021-05-01 DIAGNOSIS — M1990 Unspecified osteoarthritis, unspecified site: Secondary | ICD-10-CM | POA: Diagnosis not present

## 2021-05-01 DIAGNOSIS — I5042 Chronic combined systolic (congestive) and diastolic (congestive) heart failure: Secondary | ICD-10-CM | POA: Diagnosis not present

## 2021-05-01 DIAGNOSIS — E11319 Type 2 diabetes mellitus with unspecified diabetic retinopathy without macular edema: Secondary | ICD-10-CM | POA: Diagnosis not present

## 2021-05-01 DIAGNOSIS — G43909 Migraine, unspecified, not intractable, without status migrainosus: Secondary | ICD-10-CM | POA: Diagnosis not present

## 2021-05-02 DIAGNOSIS — J45909 Unspecified asthma, uncomplicated: Secondary | ICD-10-CM | POA: Diagnosis not present

## 2021-05-02 DIAGNOSIS — G43909 Migraine, unspecified, not intractable, without status migrainosus: Secondary | ICD-10-CM | POA: Diagnosis not present

## 2021-05-02 DIAGNOSIS — E11319 Type 2 diabetes mellitus with unspecified diabetic retinopathy without macular edema: Secondary | ICD-10-CM | POA: Diagnosis not present

## 2021-05-02 DIAGNOSIS — M4804 Spinal stenosis, thoracic region: Secondary | ICD-10-CM | POA: Diagnosis not present

## 2021-05-02 DIAGNOSIS — G5603 Carpal tunnel syndrome, bilateral upper limbs: Secondary | ICD-10-CM | POA: Diagnosis not present

## 2021-05-02 DIAGNOSIS — G894 Chronic pain syndrome: Secondary | ICD-10-CM | POA: Diagnosis not present

## 2021-05-02 DIAGNOSIS — I5042 Chronic combined systolic (congestive) and diastolic (congestive) heart failure: Secondary | ICD-10-CM | POA: Diagnosis not present

## 2021-05-02 DIAGNOSIS — N1832 Chronic kidney disease, stage 3b: Secondary | ICD-10-CM | POA: Diagnosis not present

## 2021-05-02 DIAGNOSIS — E1122 Type 2 diabetes mellitus with diabetic chronic kidney disease: Secondary | ICD-10-CM | POA: Diagnosis not present

## 2021-05-02 DIAGNOSIS — M1990 Unspecified osteoarthritis, unspecified site: Secondary | ICD-10-CM | POA: Diagnosis not present

## 2021-05-02 DIAGNOSIS — L89313 Pressure ulcer of right buttock, stage 3: Secondary | ICD-10-CM | POA: Diagnosis not present

## 2021-05-02 DIAGNOSIS — L89322 Pressure ulcer of left buttock, stage 2: Secondary | ICD-10-CM | POA: Diagnosis not present

## 2021-05-02 DIAGNOSIS — D631 Anemia in chronic kidney disease: Secondary | ICD-10-CM | POA: Diagnosis not present

## 2021-05-02 DIAGNOSIS — I700 Atherosclerosis of aorta: Secondary | ICD-10-CM | POA: Diagnosis not present

## 2021-05-02 DIAGNOSIS — S81801D Unspecified open wound, right lower leg, subsequent encounter: Secondary | ICD-10-CM | POA: Diagnosis not present

## 2021-05-02 DIAGNOSIS — S81802A Unspecified open wound, left lower leg, initial encounter: Secondary | ICD-10-CM | POA: Diagnosis not present

## 2021-05-02 DIAGNOSIS — I872 Venous insufficiency (chronic) (peripheral): Secondary | ICD-10-CM | POA: Diagnosis not present

## 2021-05-02 DIAGNOSIS — H409 Unspecified glaucoma: Secondary | ICD-10-CM | POA: Diagnosis not present

## 2021-05-02 DIAGNOSIS — L97821 Non-pressure chronic ulcer of other part of left lower leg limited to breakdown of skin: Secondary | ICD-10-CM | POA: Diagnosis not present

## 2021-05-02 DIAGNOSIS — G4720 Circadian rhythm sleep disorder, unspecified type: Secondary | ICD-10-CM | POA: Diagnosis not present

## 2021-05-02 DIAGNOSIS — I2510 Atherosclerotic heart disease of native coronary artery without angina pectoris: Secondary | ICD-10-CM | POA: Diagnosis not present

## 2021-05-02 DIAGNOSIS — L89323 Pressure ulcer of left buttock, stage 3: Secondary | ICD-10-CM | POA: Diagnosis not present

## 2021-05-02 DIAGNOSIS — I130 Hypertensive heart and chronic kidney disease with heart failure and stage 1 through stage 4 chronic kidney disease, or unspecified chronic kidney disease: Secondary | ICD-10-CM | POA: Diagnosis not present

## 2021-05-04 NOTE — Progress Notes (Signed)
Pre-visit labs reviewed, and will address at office visit.

## 2021-05-05 ENCOUNTER — Telehealth: Payer: Self-pay

## 2021-05-05 DIAGNOSIS — I130 Hypertensive heart and chronic kidney disease with heart failure and stage 1 through stage 4 chronic kidney disease, or unspecified chronic kidney disease: Secondary | ICD-10-CM | POA: Diagnosis not present

## 2021-05-05 DIAGNOSIS — J45909 Unspecified asthma, uncomplicated: Secondary | ICD-10-CM | POA: Diagnosis not present

## 2021-05-05 DIAGNOSIS — G43909 Migraine, unspecified, not intractable, without status migrainosus: Secondary | ICD-10-CM | POA: Diagnosis not present

## 2021-05-05 DIAGNOSIS — E1122 Type 2 diabetes mellitus with diabetic chronic kidney disease: Secondary | ICD-10-CM | POA: Diagnosis not present

## 2021-05-05 DIAGNOSIS — G894 Chronic pain syndrome: Secondary | ICD-10-CM | POA: Diagnosis not present

## 2021-05-05 DIAGNOSIS — L89323 Pressure ulcer of left buttock, stage 3: Secondary | ICD-10-CM | POA: Diagnosis not present

## 2021-05-05 DIAGNOSIS — I2510 Atherosclerotic heart disease of native coronary artery without angina pectoris: Secondary | ICD-10-CM | POA: Diagnosis not present

## 2021-05-05 DIAGNOSIS — S81801D Unspecified open wound, right lower leg, subsequent encounter: Secondary | ICD-10-CM | POA: Diagnosis not present

## 2021-05-05 DIAGNOSIS — G5603 Carpal tunnel syndrome, bilateral upper limbs: Secondary | ICD-10-CM | POA: Diagnosis not present

## 2021-05-05 DIAGNOSIS — G4720 Circadian rhythm sleep disorder, unspecified type: Secondary | ICD-10-CM | POA: Diagnosis not present

## 2021-05-05 DIAGNOSIS — H409 Unspecified glaucoma: Secondary | ICD-10-CM | POA: Diagnosis not present

## 2021-05-05 DIAGNOSIS — L89313 Pressure ulcer of right buttock, stage 3: Secondary | ICD-10-CM | POA: Diagnosis not present

## 2021-05-05 DIAGNOSIS — I872 Venous insufficiency (chronic) (peripheral): Secondary | ICD-10-CM | POA: Diagnosis not present

## 2021-05-05 DIAGNOSIS — D631 Anemia in chronic kidney disease: Secondary | ICD-10-CM | POA: Diagnosis not present

## 2021-05-05 DIAGNOSIS — N1832 Chronic kidney disease, stage 3b: Secondary | ICD-10-CM | POA: Diagnosis not present

## 2021-05-05 DIAGNOSIS — M1990 Unspecified osteoarthritis, unspecified site: Secondary | ICD-10-CM | POA: Diagnosis not present

## 2021-05-05 DIAGNOSIS — L97821 Non-pressure chronic ulcer of other part of left lower leg limited to breakdown of skin: Secondary | ICD-10-CM | POA: Diagnosis not present

## 2021-05-05 DIAGNOSIS — E11319 Type 2 diabetes mellitus with unspecified diabetic retinopathy without macular edema: Secondary | ICD-10-CM | POA: Diagnosis not present

## 2021-05-05 DIAGNOSIS — I700 Atherosclerosis of aorta: Secondary | ICD-10-CM | POA: Diagnosis not present

## 2021-05-05 DIAGNOSIS — M4804 Spinal stenosis, thoracic region: Secondary | ICD-10-CM | POA: Diagnosis not present

## 2021-05-05 DIAGNOSIS — I5042 Chronic combined systolic (congestive) and diastolic (congestive) heart failure: Secondary | ICD-10-CM | POA: Diagnosis not present

## 2021-05-05 NOTE — Telephone Encounter (Signed)
Patient received a letter in the mail to call primary care to received information about a scan recently done.    [phone] Mechele Claude)

## 2021-05-05 NOTE — Telephone Encounter (Signed)
Patient's daughter states that she will contact the number listed on the paper to see exactly why she has to f/u with PCP. Letter was from patient's hospital stay 03/29/21.

## 2021-05-08 DIAGNOSIS — M1990 Unspecified osteoarthritis, unspecified site: Secondary | ICD-10-CM | POA: Diagnosis not present

## 2021-05-08 DIAGNOSIS — G5603 Carpal tunnel syndrome, bilateral upper limbs: Secondary | ICD-10-CM | POA: Diagnosis not present

## 2021-05-08 DIAGNOSIS — L89323 Pressure ulcer of left buttock, stage 3: Secondary | ICD-10-CM | POA: Diagnosis not present

## 2021-05-08 DIAGNOSIS — G894 Chronic pain syndrome: Secondary | ICD-10-CM | POA: Diagnosis not present

## 2021-05-08 DIAGNOSIS — I700 Atherosclerosis of aorta: Secondary | ICD-10-CM | POA: Diagnosis not present

## 2021-05-08 DIAGNOSIS — M4804 Spinal stenosis, thoracic region: Secondary | ICD-10-CM | POA: Diagnosis not present

## 2021-05-08 DIAGNOSIS — I872 Venous insufficiency (chronic) (peripheral): Secondary | ICD-10-CM | POA: Diagnosis not present

## 2021-05-08 DIAGNOSIS — N1832 Chronic kidney disease, stage 3b: Secondary | ICD-10-CM | POA: Diagnosis not present

## 2021-05-08 DIAGNOSIS — H409 Unspecified glaucoma: Secondary | ICD-10-CM | POA: Diagnosis not present

## 2021-05-08 DIAGNOSIS — I130 Hypertensive heart and chronic kidney disease with heart failure and stage 1 through stage 4 chronic kidney disease, or unspecified chronic kidney disease: Secondary | ICD-10-CM | POA: Diagnosis not present

## 2021-05-08 DIAGNOSIS — I5042 Chronic combined systolic (congestive) and diastolic (congestive) heart failure: Secondary | ICD-10-CM | POA: Diagnosis not present

## 2021-05-08 DIAGNOSIS — E11319 Type 2 diabetes mellitus with unspecified diabetic retinopathy without macular edema: Secondary | ICD-10-CM | POA: Diagnosis not present

## 2021-05-08 DIAGNOSIS — S81801D Unspecified open wound, right lower leg, subsequent encounter: Secondary | ICD-10-CM | POA: Diagnosis not present

## 2021-05-08 DIAGNOSIS — L89313 Pressure ulcer of right buttock, stage 3: Secondary | ICD-10-CM | POA: Diagnosis not present

## 2021-05-08 DIAGNOSIS — E1122 Type 2 diabetes mellitus with diabetic chronic kidney disease: Secondary | ICD-10-CM | POA: Diagnosis not present

## 2021-05-08 DIAGNOSIS — I2510 Atherosclerotic heart disease of native coronary artery without angina pectoris: Secondary | ICD-10-CM | POA: Diagnosis not present

## 2021-05-08 DIAGNOSIS — D631 Anemia in chronic kidney disease: Secondary | ICD-10-CM | POA: Diagnosis not present

## 2021-05-08 DIAGNOSIS — G43909 Migraine, unspecified, not intractable, without status migrainosus: Secondary | ICD-10-CM | POA: Diagnosis not present

## 2021-05-08 DIAGNOSIS — G4720 Circadian rhythm sleep disorder, unspecified type: Secondary | ICD-10-CM | POA: Diagnosis not present

## 2021-05-08 DIAGNOSIS — L97821 Non-pressure chronic ulcer of other part of left lower leg limited to breakdown of skin: Secondary | ICD-10-CM | POA: Diagnosis not present

## 2021-05-08 DIAGNOSIS — J45909 Unspecified asthma, uncomplicated: Secondary | ICD-10-CM | POA: Diagnosis not present

## 2021-05-13 ENCOUNTER — Ambulatory Visit: Payer: Self-pay | Admitting: *Deleted

## 2021-05-13 NOTE — Telephone Encounter (Signed)
Summary: imaging result   Pt had imaging done on 8.18.22 and received an incidental findings letter / pts daughter contacted PCP but they advised that they didn't know what findings the letter was advising of/ please call daughter Quincy Simmonds to advise

## 2021-05-13 NOTE — Telephone Encounter (Signed)
After review of results- not seeing obvious reason for follow up- reached out to supervising nurse for review and more information.

## 2021-05-14 NOTE — Telephone Encounter (Signed)
Summary: imaging result   Pt daughter Quincy Simmonds stated she requested a call back from a nurse on yesterday regarding a letter they received but she has yet to hear back from anyone. Pt daughter requests a return call asap at [phone]   ----- Message from Sharene Skeans sent at 05/13/2021  8:32 AM EDT -----  Pt had imaging done on 8.18.22 and received an incidental findings letter / pts daughter contacted PCP but they advised that they didn't know what findings the letter was advising of/ please call daughter Quincy Simmonds to advise

## 2021-05-20 NOTE — Telephone Encounter (Signed)
Pt.'s daughter, Silva,calling again to get results from x-ray performed 03/20/21 to tib/fib. Spoke with [facility] agent. Received letter about "incidental findings" and instructed to call her PCP. States she called Dr. Gwynn Burly office and was not given information. Please advise daughter in regard to x-ray findings from 03/20/21.Marland Kitchen

## 2021-05-22 DIAGNOSIS — E11319 Type 2 diabetes mellitus with unspecified diabetic retinopathy without macular edema: Secondary | ICD-10-CM | POA: Diagnosis not present

## 2021-05-22 DIAGNOSIS — D649 Anemia, unspecified: Secondary | ICD-10-CM | POA: Diagnosis not present

## 2021-05-22 DIAGNOSIS — G9340 Encephalopathy, unspecified: Secondary | ICD-10-CM | POA: Diagnosis not present

## 2021-05-22 DIAGNOSIS — K219 Gastro-esophageal reflux disease without esophagitis: Secondary | ICD-10-CM | POA: Diagnosis not present

## 2021-06-03 ENCOUNTER — Other Ambulatory Visit: Payer: Self-pay | Admitting: Internal Medicine

## 2021-06-03 DIAGNOSIS — I10 Essential (primary) hypertension: Secondary | ICD-10-CM

## 2021-06-03 NOTE — Telephone Encounter (Signed)
Please refill as per office routine med refill policy (all routine meds to be refilled for 3 mo or monthly (per pt preference) up to one year from last visit, then month to month grace period for 3 mo, then further med refills will have to be denied) ? ?

## 2021-06-20 ENCOUNTER — Telehealth: Payer: Self-pay

## 2021-06-20 NOTE — Telephone Encounter (Signed)
Patient's sister, Julienne Kass, called and requesting to know if patient is in hospice care. Advised patient's sister numerous times that we are unable to discuss any of the patient's information because she is not listed on any of the patient's DPR info. Patient's sister has a rude tone and proceeded to state that she "doesn't understand why the doctor or office wouldn't notify patient's family that she is on hospice and has 90 days to live". I advised Ms. Shoffner that I am unable to give any patient related information. She then stated "that's not patient related information, that's my blood sister and all I'm asking is a question. Is she in hospice". Offered her to speak with management staff to discuss the situation. Management staff gone to lunch. Advised patient's sister that someone would give her a call back and she then continued complaining about our office and not communicating information related to the patient to she or the family. She then said "I don't need you to tell me what you can and can't do, I heard you the first time". I said "ma'am have a great day" and the call was disconnected.

## 2021-07-23 ENCOUNTER — Telehealth: Payer: Self-pay | Admitting: Internal Medicine

## 2021-07-23 NOTE — Telephone Encounter (Signed)
Patient's daughter Quincy Simmonds informing provider patient has passed away

## 2021-07-30 ENCOUNTER — Ambulatory Visit: Payer: Medicare Other | Admitting: Internal Medicine

## 2021-08-03 DEATH — deceased

## 2022-02-13 IMAGING — CT CT CHEST W/O CM
2 of 3 series · 15 of 36 positions shown, 18 images · non-contrast
Comparison: Chest radiograph dated 03/29/2021.

CLINICAL DATA: Concern for infiltrate.

EXAM:
CT CHEST WITHOUT CONTRAST
TECHNIQUE: Multidetector CT imaging of the chest was performed following the
standard protocol without IV contrast.

[Series 2: thorax · axial · 0.68mm/px · z∈[-184,-6]mm · 12 of 105 slices shown, 15 images]
[im 8/105  mediastinal]
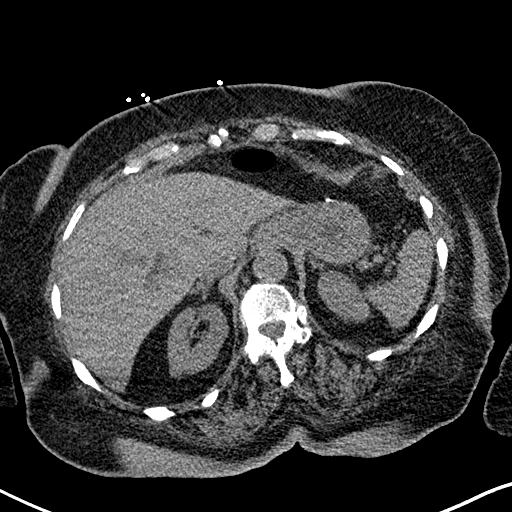
[im 8/105  lung]
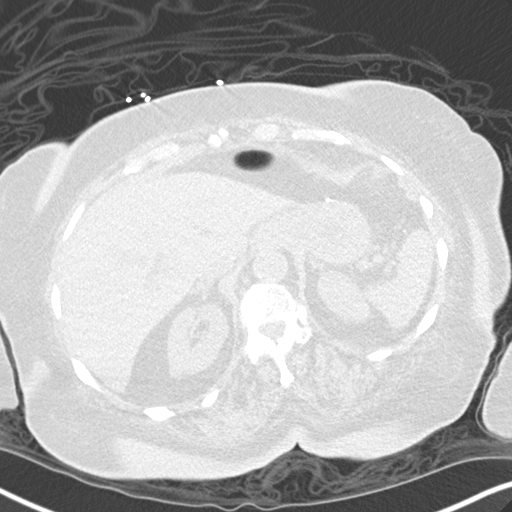
[im 16/105  lung]
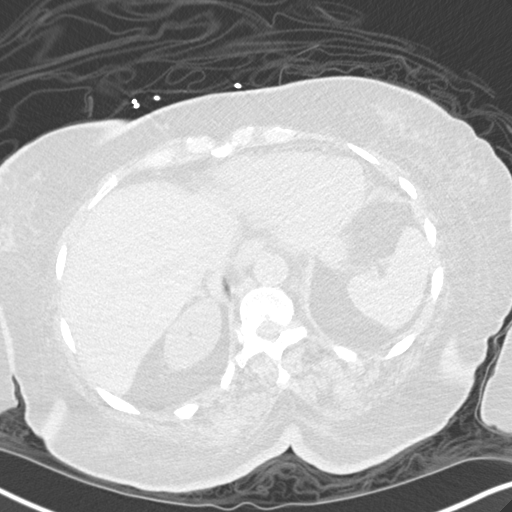
[im 24/105  lung]
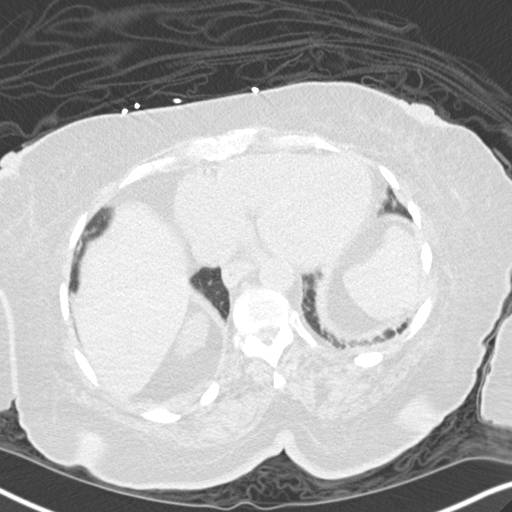
[im 31/105  lung]
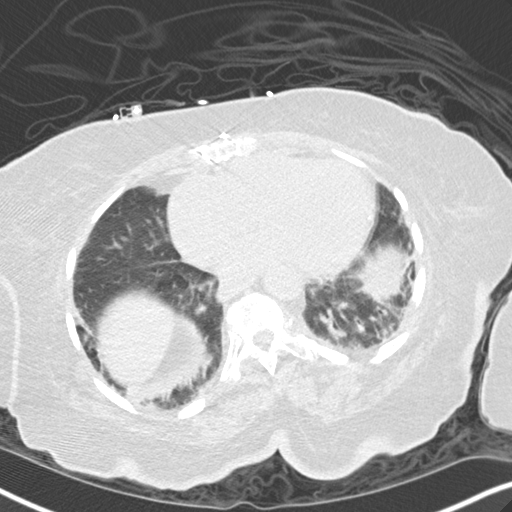
[im 39/105  mediastinal]
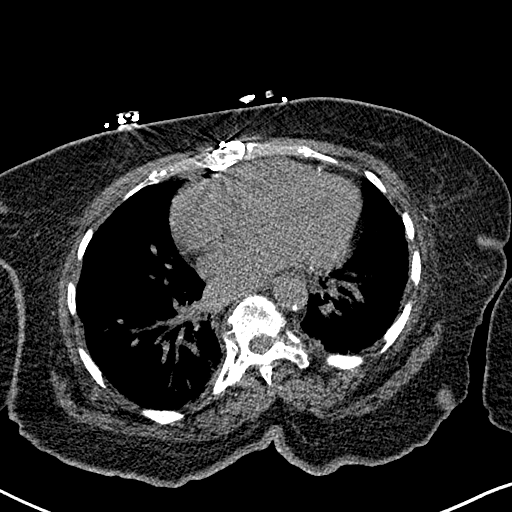
[im 39/105  lung]
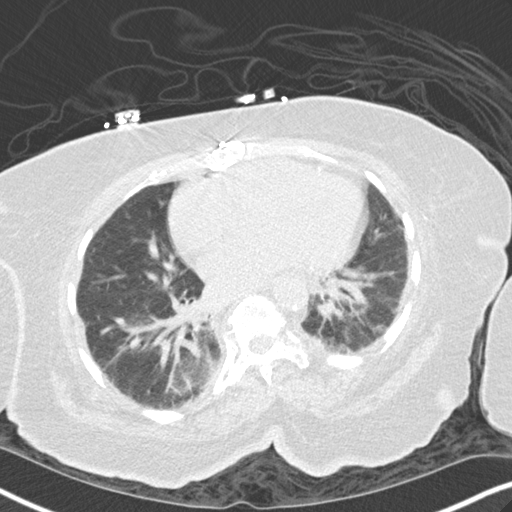
[im 47/105  lung]
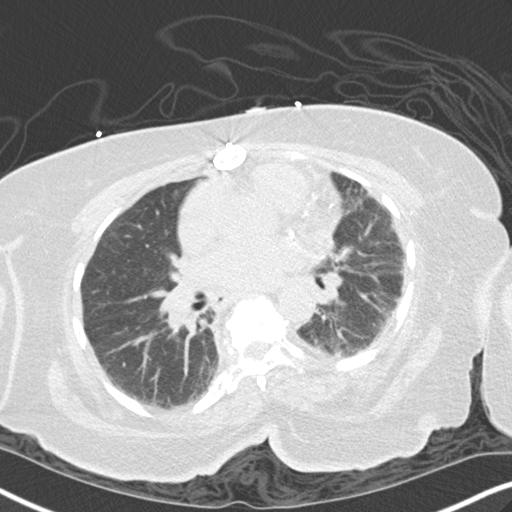
[im 58/105  lung]
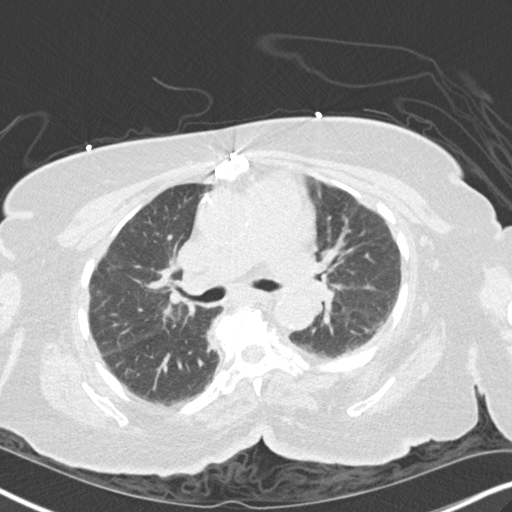
[im 66/105  lung]
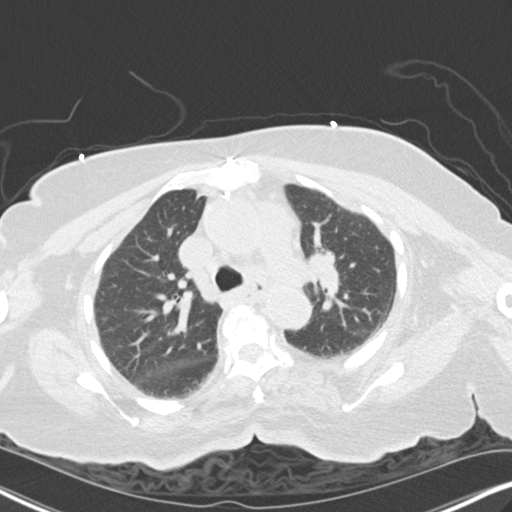
[im 74/105  mediastinal]
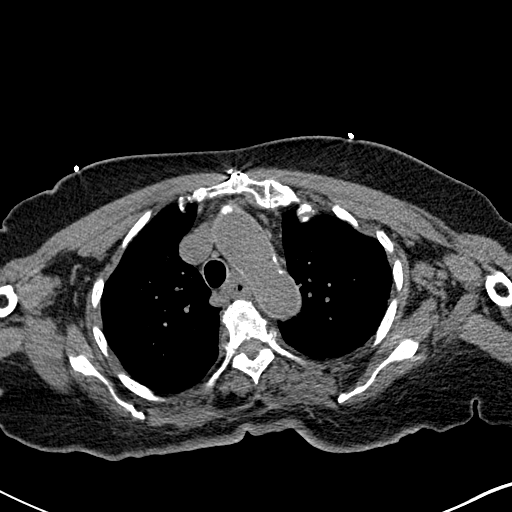
[im 74/105  lung]
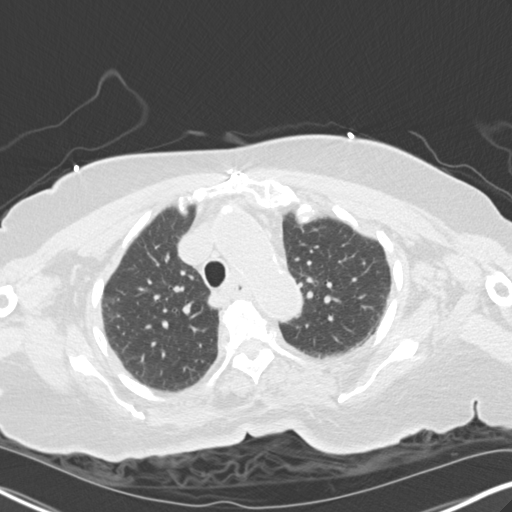
[im 81/105  lung]
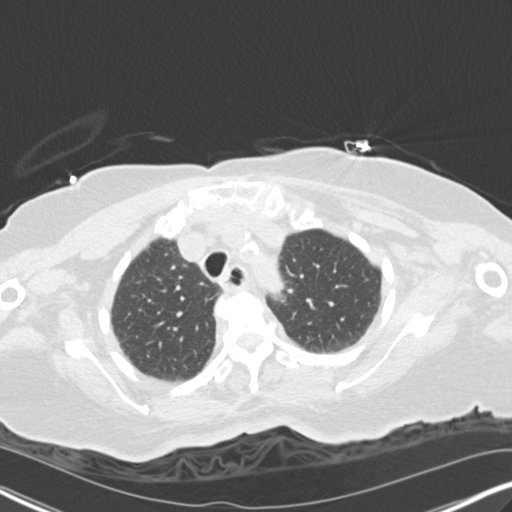
[im 89/105  lung]
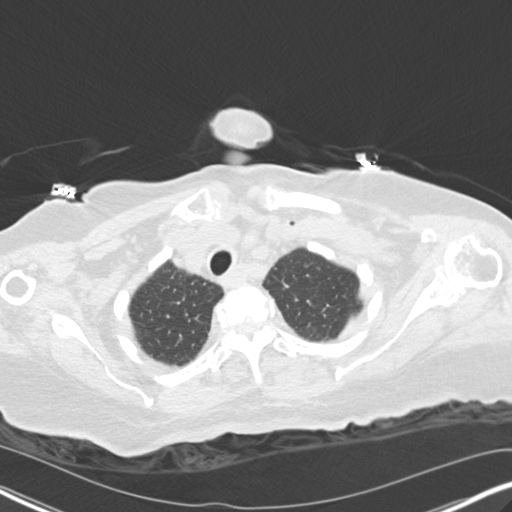
[im 97/105  lung]
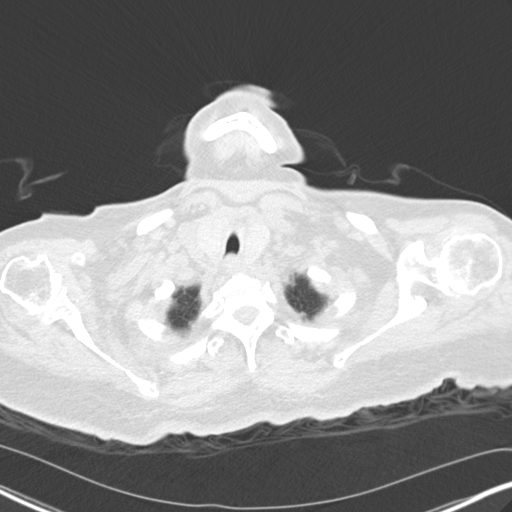

[Series 6: coronal · coronal · 0.44mm/px · 3 of 119 slices shown]
[im 24/119  lung]
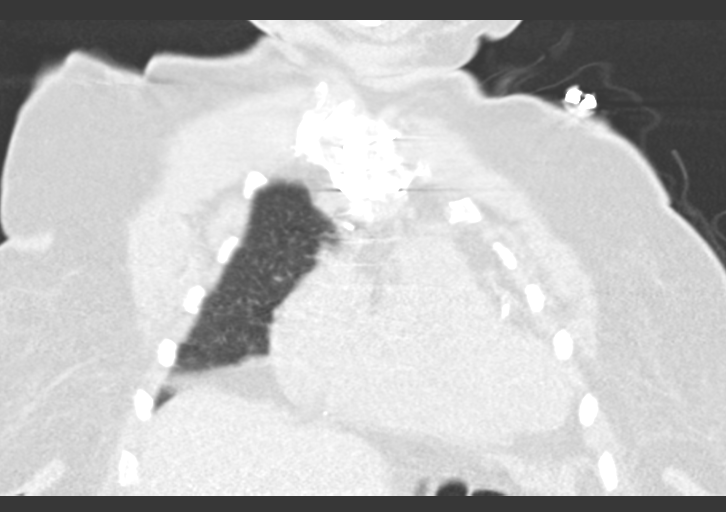
[im 48/119  lung]
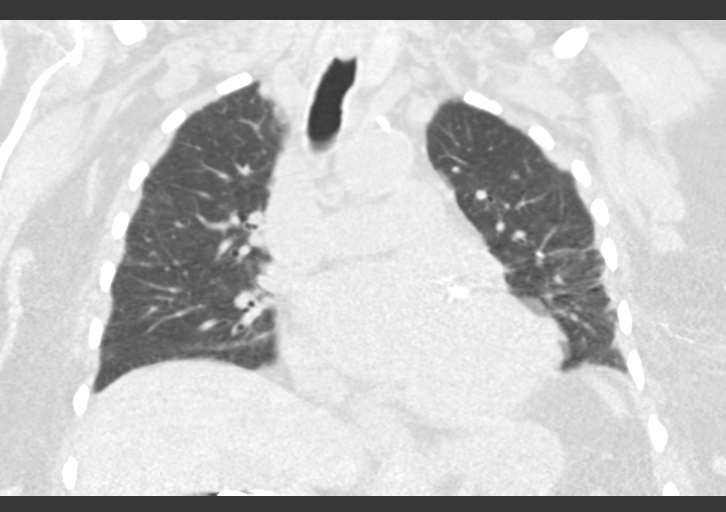
[im 71/119  lung]
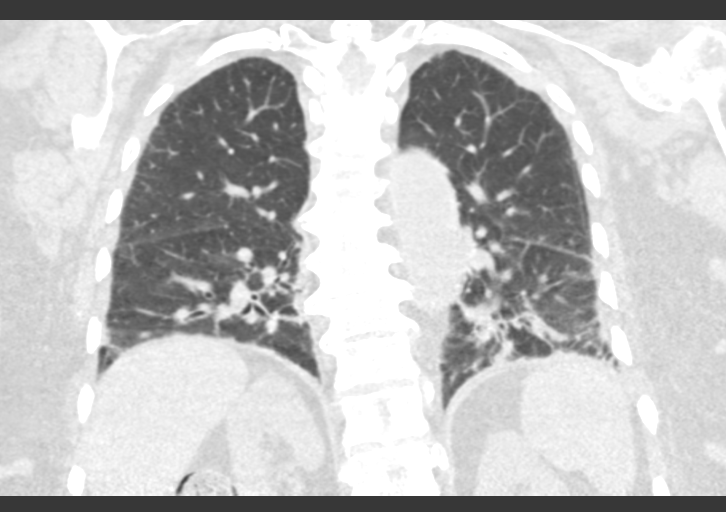

[15 of 36 positions shown; findings below may reference images not displayed]

FINDINGS: Evaluation of this exam is limited in the absence of intravenous
contrast.

Cardiovascular: Mild cardiomegaly. No pericardial effusion. There is
a three-vessel coronary vascular calcification. Mild atherosclerotic
calcification of the thoracic aorta. No aneurysmal dilatation. The
central pulmonary arteries are grossly unremarkable.

Mediastinum/Nodes: No hilar or mediastinal adenopathy. Evaluation
however is limited in the absence of intravenous contrast. The
esophagus and the thyroid gland are grossly unremarkable. No
mediastinal fluid collection.

Lungs/Pleura: Bilateral lower lobe linear atelectasis/scarring.
Focal consolidation, pleural effusion, or pneumothorax. The central
airways are patent.

Upper Abdomen: Cholecystectomy.

Musculoskeletal: Median sternotomy wires. Degenerative changes of
the spine. No acute osseous pathology.
IMPRESSION: 1. No acute intrathoracic pathology.
2. Mild cardiomegaly with three-vessel coronary vascular
calcification.
3. Aortic Atherosclerosis ([id number]).

## 2022-02-13 IMAGING — CT CT HEAD W/O CM
3 of 4 series · 13 of 47 positions shown, 15 images · non-contrast
Comparison: September 27, 2019

CLINICAL DATA: Altered mental status.

EXAM:
CT HEAD WITHOUT CONTRAST
TECHNIQUE: Contiguous axial images were obtained from the base of the skull
through the vertex without intravenous contrast.

[Series 3: head wo · axial · 0.49mm/px · z∈[+1365,+1485]mm · 7 of 33 slices shown, 9 images]
[im 5/33  brain]
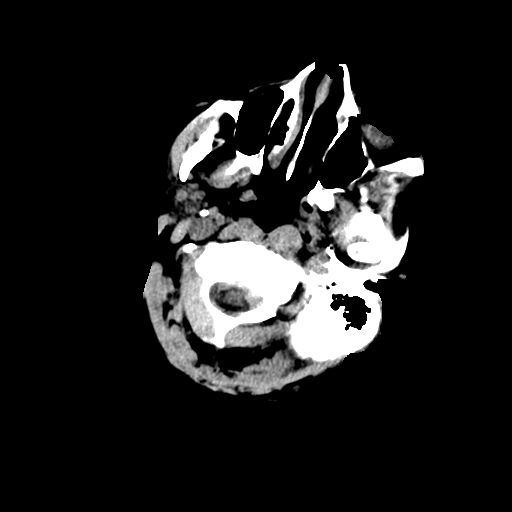
[im 5/33  bone]
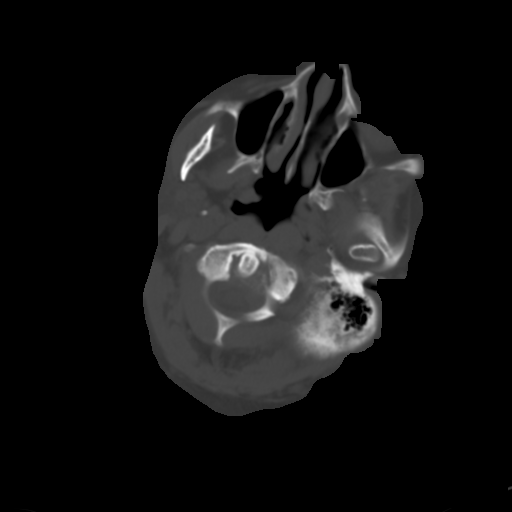
[im 9/33  brain]
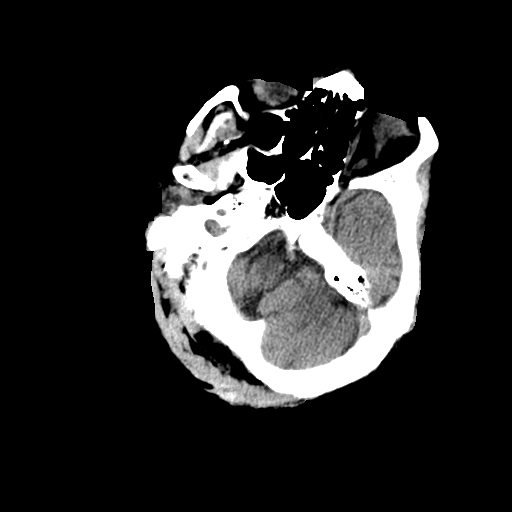
[im 13/33  brain]
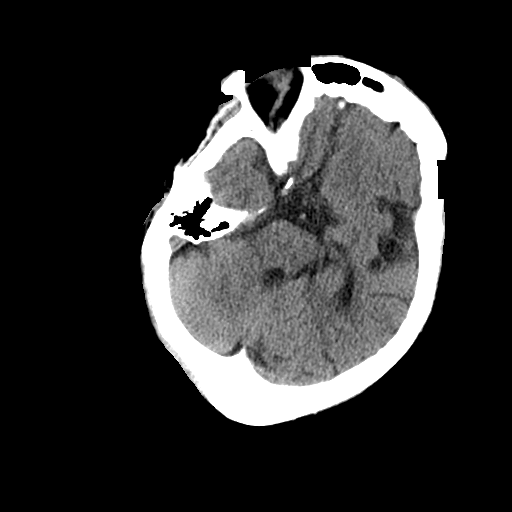
[im 17/33  brain]
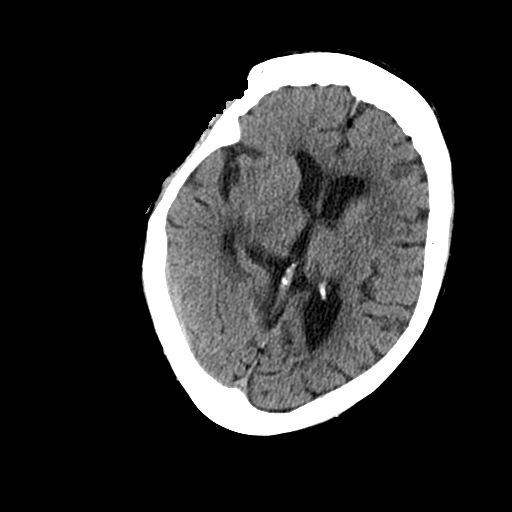
[im 21/33  brain]
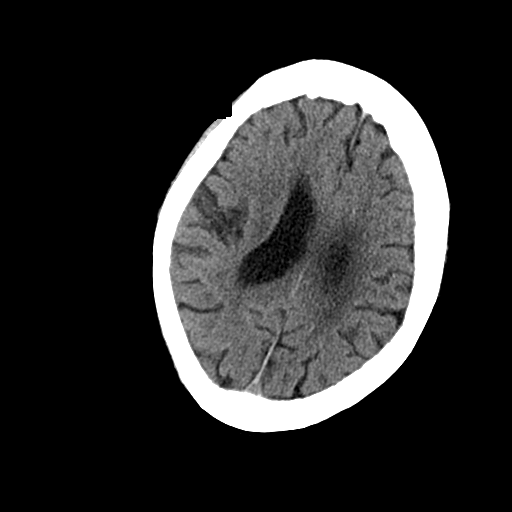
[im 21/33  bone]
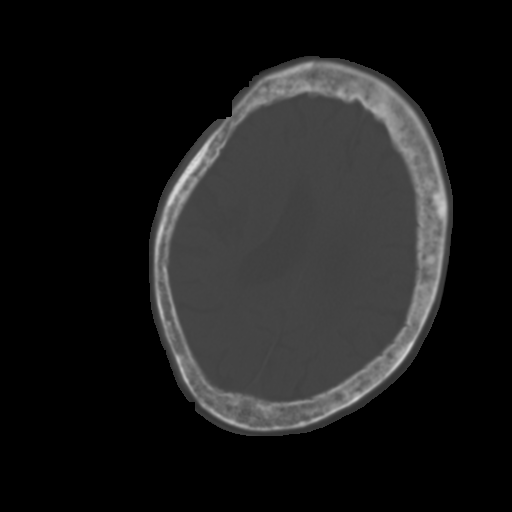
[im 25/33  brain]
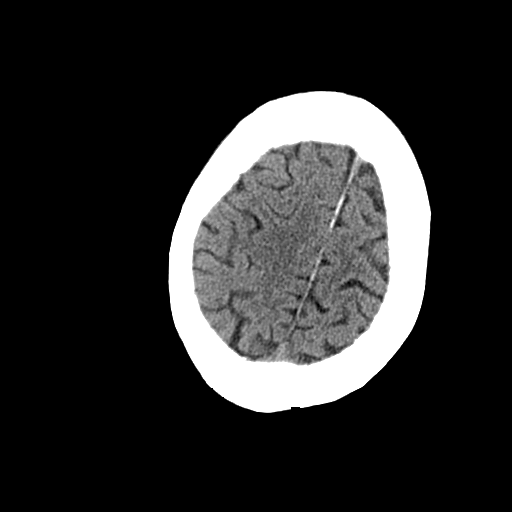
[im 29/33  brain]
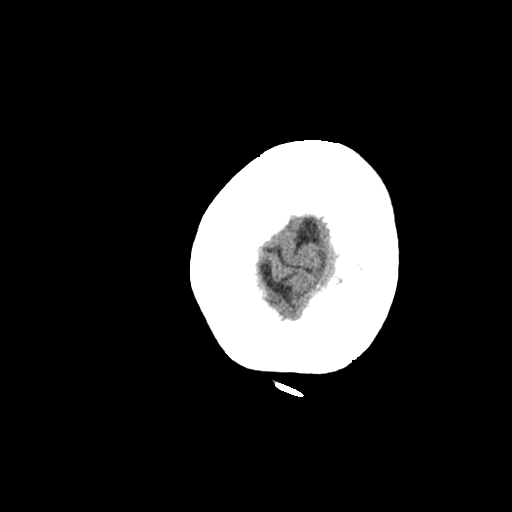

[Series 6: coronal soft tissue · coronal · 0.32mm/px · 3 of 84 slices shown]
[im 28/84  brain]
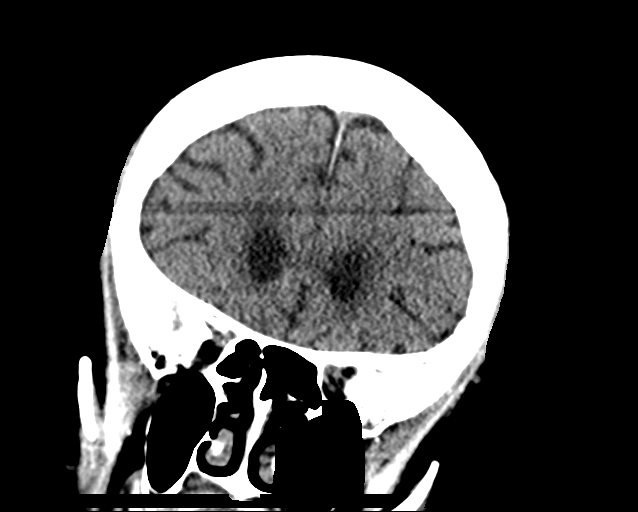
[im 37/84  brain]
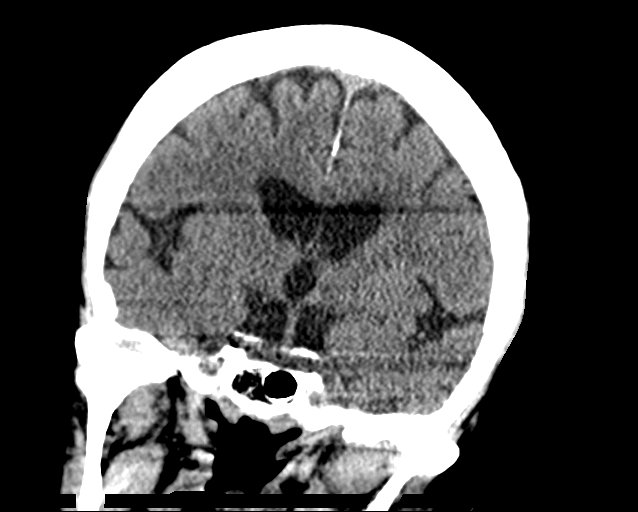
[im 47/84  brain]
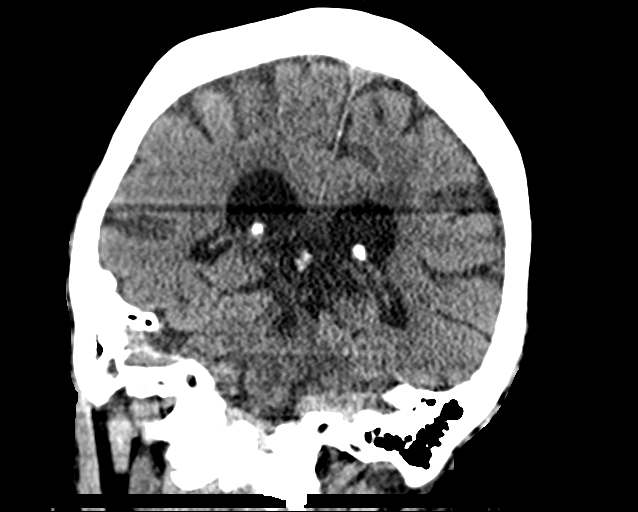

[Series 7: sagittal soft tissue · sagittal · 0.32mm/px · 3 of 59 slices shown]
[im 20/59  brain]
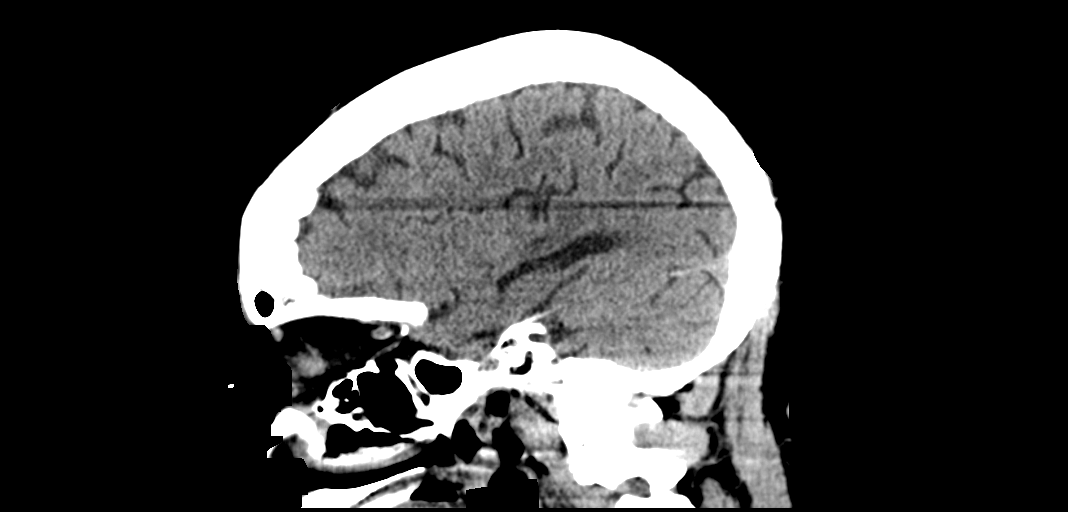
[im 30/59  brain]
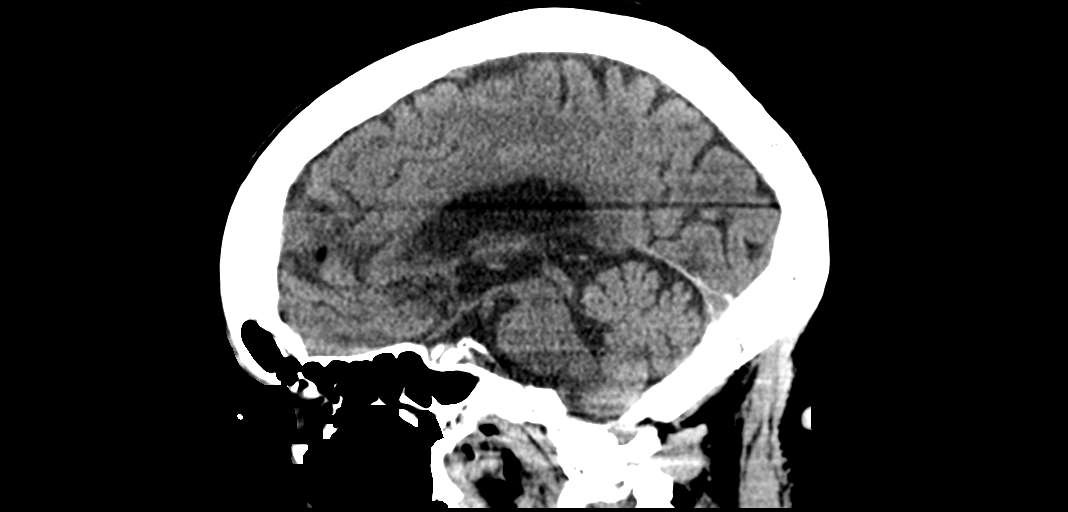
[im 39/59  brain]
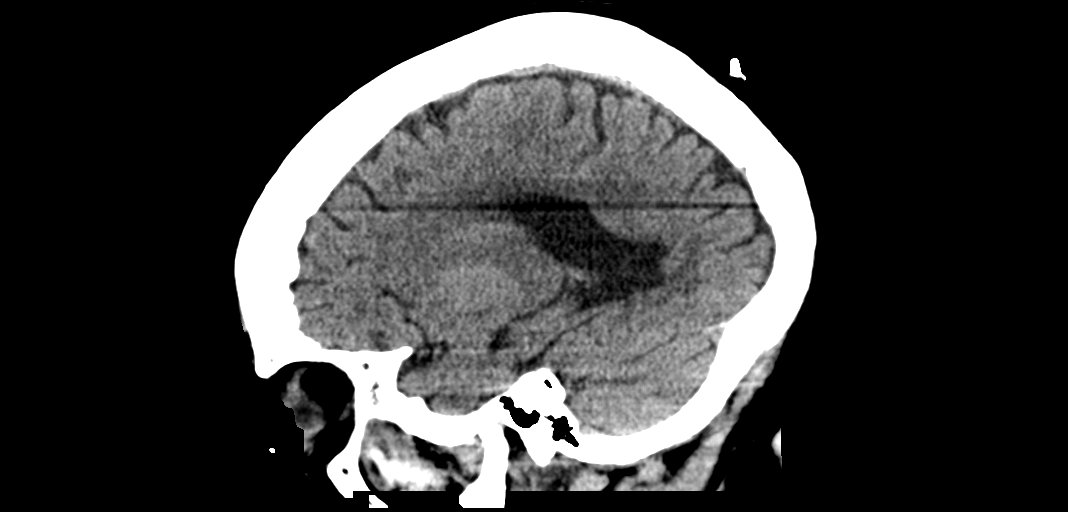

[13 of 47 positions shown; findings below may reference images not displayed]

FINDINGS: Brain: There is mild cerebral atrophy with widening of the
extra-axial spaces and ventricular dilatation.
There are areas of decreased attenuation within the white matter
tracts of the supratentorial brain, consistent with microvascular
disease changes.

Vascular: No hyperdense vessel or unexpected calcification.

Skull: Normal. Negative for fracture or focal lesion.

Sinuses/Orbits: Chronic diffuse calvarial thickening is seen with
stable appearing mixed lytic and sclerotic areas.

Other: None.
IMPRESSION: 1. Generalized cerebral atrophy.
2. No acute intracranial abnormality.
